# Patient Record
Sex: Male | Born: 1945
Health system: Southern US, Community
[De-identification: ages and names within clinical notes are randomized; demographics above are authoritative.]

## PROBLEM LIST (undated history)

## (undated) DIAGNOSIS — M25519 Pain in unspecified shoulder: Secondary | ICD-10-CM

## (undated) DIAGNOSIS — F17200 Nicotine dependence, unspecified, uncomplicated: Secondary | ICD-10-CM

## (undated) DIAGNOSIS — Z860100 Personal history of colon polyps, unspecified: Secondary | ICD-10-CM

## (undated) DIAGNOSIS — M1990 Unspecified osteoarthritis, unspecified site: Secondary | ICD-10-CM

## (undated) DIAGNOSIS — E559 Vitamin D deficiency, unspecified: Secondary | ICD-10-CM

## (undated) DIAGNOSIS — R001 Bradycardia, unspecified: Secondary | ICD-10-CM

## (undated) DIAGNOSIS — G250 Essential tremor: Secondary | ICD-10-CM

## (undated) DIAGNOSIS — R112 Nausea with vomiting, unspecified: Secondary | ICD-10-CM

## (undated) DIAGNOSIS — I10 Essential (primary) hypertension: Secondary | ICD-10-CM

## (undated) DIAGNOSIS — I499 Cardiac arrhythmia, unspecified: Secondary | ICD-10-CM

## (undated) DIAGNOSIS — Z9889 Other specified postprocedural states: Secondary | ICD-10-CM

## (undated) DIAGNOSIS — E785 Hyperlipidemia, unspecified: Secondary | ICD-10-CM

## (undated) DIAGNOSIS — D369 Benign neoplasm, unspecified site: Secondary | ICD-10-CM

## (undated) DIAGNOSIS — R748 Abnormal levels of other serum enzymes: Secondary | ICD-10-CM

## (undated) DIAGNOSIS — K219 Gastro-esophageal reflux disease without esophagitis: Secondary | ICD-10-CM

## (undated) DIAGNOSIS — M25562 Pain in left knee: Secondary | ICD-10-CM

## (undated) DIAGNOSIS — E291 Testicular hypofunction: Secondary | ICD-10-CM

## (undated) DIAGNOSIS — J302 Other seasonal allergic rhinitis: Secondary | ICD-10-CM

## (undated) DIAGNOSIS — N529 Male erectile dysfunction, unspecified: Secondary | ICD-10-CM

## (undated) DIAGNOSIS — I4892 Unspecified atrial flutter: Secondary | ICD-10-CM

## (undated) DIAGNOSIS — I2510 Atherosclerotic heart disease of native coronary artery without angina pectoris: Secondary | ICD-10-CM

## (undated) DIAGNOSIS — R251 Tremor, unspecified: Secondary | ICD-10-CM

## (undated) DIAGNOSIS — S0990XA Unspecified injury of head, initial encounter: Secondary | ICD-10-CM

## (undated) DIAGNOSIS — G4730 Sleep apnea, unspecified: Secondary | ICD-10-CM

## (undated) DIAGNOSIS — G060 Intracranial abscess and granuloma: Secondary | ICD-10-CM

## (undated) HISTORY — DX: Pain in left knee: M25.562

## (undated) HISTORY — DX: Male erectile dysfunction, unspecified: N52.9

## (undated) HISTORY — DX: Gastro-esophageal reflux disease without esophagitis: K21.9

## (undated) HISTORY — DX: Benign neoplasm, unspecified site: D36.9

## (undated) HISTORY — DX: Intracranial abscess and granuloma: G06.0

## (undated) HISTORY — DX: Tremor, unspecified: R25.1

## (undated) HISTORY — DX: Testicular hypofunction: E29.1

## (undated) HISTORY — DX: Personal history of colon polyps, unspecified: Z86.0100

## (undated) HISTORY — DX: Nicotine dependence, unspecified, uncomplicated: F17.200

## (undated) HISTORY — PX: HERNIA REPAIR: SHX51

## (undated) HISTORY — DX: Essential (primary) hypertension: I10

## (undated) HISTORY — DX: Other seasonal allergic rhinitis: J30.2

## (undated) HISTORY — DX: Personal history of colonic polyps: Z86.010

## (undated) HISTORY — PX: DEEP BRAIN STIMULATOR PLACEMENT: SHX608

## (undated) HISTORY — DX: Pain in unspecified shoulder: M25.519

## (undated) HISTORY — DX: Hyperlipidemia, unspecified: E78.5

## (undated) HISTORY — DX: Vitamin D deficiency, unspecified: E55.9

---

## 2005-09-05 ENCOUNTER — Ambulatory Visit: Payer: Self-pay | Admitting: Unknown Physician Specialty

## 2009-09-08 HISTORY — PX: HAND SURGERY: SHX662

## 2009-11-23 ENCOUNTER — Ambulatory Visit: Payer: Self-pay | Admitting: Emergency Medicine

## 2011-04-18 ENCOUNTER — Ambulatory Visit: Payer: Self-pay | Admitting: Unknown Physician Specialty

## 2011-09-09 HISTORY — PX: COLONOSCOPY: SHX174

## 2011-09-09 LAB — HM COLONOSCOPY: HM Colonoscopy: NORMAL

## 2012-09-21 ENCOUNTER — Other Ambulatory Visit: Payer: Self-pay | Admitting: Family Medicine

## 2012-09-21 NOTE — Telephone Encounter (Signed)
No paper chart

## 2012-09-23 ENCOUNTER — Ambulatory Visit (INDEPENDENT_AMBULATORY_CARE_PROVIDER_SITE_OTHER): Payer: PRIVATE HEALTH INSURANCE | Admitting: Family Medicine

## 2012-09-23 VITALS — BP 164/75 | HR 55 | Temp 97.9°F | Resp 18 | Ht 70.5 in | Wt 196.0 lb

## 2012-09-23 DIAGNOSIS — I10 Essential (primary) hypertension: Secondary | ICD-10-CM

## 2012-09-23 DIAGNOSIS — Z23 Encounter for immunization: Secondary | ICD-10-CM

## 2012-09-23 LAB — POCT CBC
Granulocyte percent: 66.8 %G (ref 37–80)
HCT, POC: 42.7 % — AB (ref 43.5–53.7)
Hemoglobin: 13.4 g/dL — AB (ref 14.1–18.1)
Lymph, poc: 2.8 (ref 0.6–3.4)
MCH, POC: 30.2 pg (ref 27–31.2)
MCHC: 31.4 g/dL — AB (ref 31.8–35.4)
MCV: 96.5 fL (ref 80–97)
MID (cbc): 0.6 (ref 0–0.9)
MPV: 8.8 fL (ref 0–99.8)
POC Granulocyte: 6.9 (ref 2–6.9)
POC LYMPH PERCENT: 27.3 %L (ref 10–50)
POC MID %: 5.9 %M (ref 0–12)
Platelet Count, POC: 342 10*3/uL (ref 142–424)
RBC: 4.43 M/uL — AB (ref 4.69–6.13)
RDW, POC: 13.5 %
WBC: 10.3 10*3/uL — AB (ref 4.6–10.2)

## 2012-09-23 MED ORDER — HYDROCHLOROTHIAZIDE 25 MG PO TABS: 25.0000 mg | ORAL_TABLET | Freq: Every day | ORAL | Status: DC

## 2012-09-23 MED ORDER — LISINOPRIL 10 MG PO TABS: 10.0000 mg | ORAL_TABLET | Freq: Every day | ORAL | Status: DC

## 2012-09-23 NOTE — Progress Notes (Signed)
[place], [place]  [address]   [phone]  Subjective:    Patient ID: Carlos Hunt, male    DOB: Mar 25, 1946, 67 y.o.   MRN: [id number]  HPIThis 67 y.o. male presents to establish care and for six month follow-up:  1. HTN:  Six month follow-up; no changes to management made at last visit.  Reports moderate compliance with medication; ran out of HCTZ one week ago; good tolerance to medication; good symptom control.  Denies CP/palp/SOB/leg swelling. Denies HA/dizziness/vision changes/paresthesias/focal weakness.   Went to eye doctor; checked blood pressure 123/60.  Ran out of HCTZ one week ago.  Last visit 03/2012.Not check ing blood pressure at home.    2.  Plans to get flu vaccine and TDAP; daughter is raising cane.  Granddaughter in NICU for esophageal atresia.      Review of Systems  Constitutional: Negative for fever, chills, diaphoresis and fatigue.  Respiratory: Negative for cough, shortness of breath and wheezing.   Cardiovascular: Negative for chest pain, palpitations and leg swelling.  Gastrointestinal: Negative for nausea, vomiting, abdominal pain, diarrhea and constipation.  Neurological: Negative for dizziness, tremors, syncope, facial asymmetry, speech difficulty, weakness, light-headedness, numbness and headaches.        Past Medical History  Diagnosis Date  . Hypertension   . Hyperlipidemia     No past surgical history on file.  Prior to Admission medications   Medication Sig Start Date End Date Taking? Authorizing Provider  aspirin 325 MG tablet Take 325 mg by mouth daily.   Yes Historical Provider, MD  Cholecalciferol (HM VITAMIN D3) 2000 UNITS CAPS Take 2,000 Units by mouth.   Yes Historical Provider, MD  Echinacea 400 MG CAPS Take 400 mg by mouth daily.   Yes Historical Provider, MD  GLUCOSAMINE-CHONDROIT-MSM-C-MN PO Take by mouth.   Yes Historical Provider, MD  hydrochlorothiazide (HYDRODIURIL) 25 MG tablet Take 25 mg by mouth daily.   Yes  Historical Provider, MD  lisinopril (PRINIVIL,ZESTRIL) 10 MG tablet Take 10 mg by mouth daily.   Yes Historical Provider, MD  Multiple Vitamins-Minerals (CENTRUM PO) Take by mouth.   Yes Historical Provider, MD  Omega-3 Fatty Acids (ULTRA OMEGA 3 PO) Take 1,000 mg by mouth 2 (two) times daily.   Yes Historical Provider, MD  vitamin B-12 (CYANOCOBALAMIN) 1000 MCG tablet Take 3,000 mcg by mouth daily.   Yes Historical Provider, MD    Allergies  Allergen Reactions  . Other Nausea And Vomiting    NOVACAINE    History   Social History  . Marital Status: Married    Spouse Name: N/A    Number of Children: N/A  . Years of Education: N/A   Occupational History  . Not on file.   Social History Main Topics  . Smoking status: Former Smoker    Quit date: 09/24/1971  . Smokeless tobacco: Not on file  . Alcohol Use: Yes  . Drug Use: No  . Sexually Active: Not on file   Other Topics Concern  . Not on file   Social History Narrative  . No narrative on file    Family History  Problem Relation Age of Onset  . Dementia Mother   . Parkinson's disease Father   . Cancer Father 59    prostate  . Diabetes Sister   . Parkinson's disease Brother     Objective:   Physical Exam  Nursing note and vitals reviewed. Constitutional: He is oriented to person, place, and time. He appears  well-developed and well-nourished. No distress.  HENT:  Head: Normocephalic and atraumatic.  Right Ear: External ear normal.  Left Ear: External ear normal.  Nose: Nose normal.  Mouth/Throat: Oropharynx is clear and moist.  Eyes: Conjunctivae normal are normal. Pupils are equal, round, and reactive to light.  Neck: Normal range of motion. Neck supple. No thyromegaly present.  Cardiovascular: Normal rate, regular rhythm and normal heart sounds.  Exam reveals no gallop and no friction rub.   No murmur heard. Pulmonary/Chest: Effort normal and breath sounds normal. He has no wheezes. He has no rales.    Abdominal: Soft. Bowel sounds are normal. He exhibits no distension and no mass. There is no tenderness. There is no rebound and no guarding.  Musculoskeletal: He exhibits no edema.  Lymphadenopathy:    He has no cervical adenopathy.  Neurological: He is alert and oriented to person, place, and time. No cranial nerve deficit. He exhibits normal muscle tone. Coordination normal.  Skin: Skin is warm and dry. He is not diaphoretic.  Psychiatric: He has a normal mood and affect. His behavior is normal.    INFLUENZA AND TDAP VACCINES ADMINISTERED.  Results for orders placed in visit on 09/23/12  POCT CBC      Component Value Range   WBC 10.3 (*) 4.6 - 10.2 K/uL   Lymph, poc 2.8  0.6 - 3.4   POC LYMPH PERCENT 27.3  10 - 50 %L   MID (cbc) 0.6  0 - 0.9   POC MID % 5.9  0 - 12 %M   POC Granulocyte 6.9  2 - 6.9   Granulocyte percent 66.8  37 - 80 %G   RBC 4.43 (*) 4.69 - 6.13 M/uL   Hemoglobin 13.4 (*) 14.1 - 18.1 g/dL   HCT, POC 42.7 (*) 43.5 - 53.7 %   MCV 96.5  80 - 97 fL   MCH, POC 30.2  27 - 31.2 pg   MCHC 31.4 (*) 31.8 - 35.4 g/dL   RDW, POC 13.5     Platelet Count, POC 342  142 - 424 K/uL   MPV 8.8  0 - 99.8 fL       Assessment & Plan:   1. Essential hypertension, benign  POCT CBC, Comprehensive metabolic panel  2. Need for prophylactic vaccination and inoculation against influenza  Flu vaccine greater than or equal to 3yo preservative free IM  3. Need for Tdap vaccination  Tdap vaccine greater than or equal to 7yo IM    1.  HTN: uncontrolled due to non-compliance with medication.  Refills provided; obtain labs.  Encourage checking blood pressure once per month at pharmacy. 2.  S/p TDAP 3.  S/p influenza vaccines.  Meds ordered this encounter  Medications  . Echinacea 400 MG CAPS    Sig: Take 400 mg by mouth daily.  Marland Kitchen GLUCOSAMINE-CHONDROIT-MSM-C-MN PO    Sig: Take by mouth.  . Multiple Vitamins-Minerals (CENTRUM PO)    Sig: Take by mouth.  . Cholecalciferol (HM  VITAMIN D3) 2000 UNITS CAPS    Sig: Take 2,000 Units by mouth.  Marland Kitchen aspirin 325 MG tablet    Sig: Take 325 mg by mouth daily.  Marland Kitchen DISCONTD: hydrochlorothiazide (HYDRODIURIL) 25 MG tablet    Sig: Take 25 mg by mouth daily.  . vitamin B-12 (CYANOCOBALAMIN) 1000 MCG tablet    Sig: Take 3,000 mcg by mouth daily.  Marland Kitchen DISCONTD: lisinopril (PRINIVIL,ZESTRIL) 10 MG tablet    Sig: Take 10 mg by mouth daily.  Marland Kitchen  Omega-3 Fatty Acids (ULTRA OMEGA 3 PO)    Sig: Take 1,000 mg by mouth 2 (two) times daily.  Marland Kitchen lisinopril (PRINIVIL,ZESTRIL) 10 MG tablet    Sig: Take 1 tablet (10 mg total) by mouth daily.    Dispense:  30 tablet    Refill:  5  . hydrochlorothiazide (HYDRODIURIL) 25 MG tablet    Sig: Take 1 tablet (25 mg total) by mouth daily.    Dispense:  30 tablet    Refill:  5

## 2012-09-23 NOTE — Patient Instructions (Addendum)
1. Essential hypertension, benign  POCT CBC, Comprehensive metabolic panel, lisinopril (PRINIVIL,ZESTRIL) 10 MG tablet, hydrochlorothiazide (HYDRODIURIL) 25 MG tablet  2. Need for prophylactic vaccination and inoculation against influenza  Flu vaccine greater than or equal to 67yo preservative free IM  3. Need for Tdap vaccination  Tdap vaccine greater than or equal to 7yo IM     PLEASE RETURN IN SIX MONTHS FOR PHYSICAL EXAMINATION.

## 2012-09-24 LAB — COMPREHENSIVE METABOLIC PANEL
ALT: 39 U/L (ref 0–53)
AST: 34 U/L (ref 0–37)
Albumin: 4.4 g/dL (ref 3.5–5.2)
Alkaline Phosphatase: 63 U/L (ref 39–117)
BUN: 14 mg/dL (ref 6–23)
CO2: 26 mEq/L (ref 19–32)
Calcium: 10 mg/dL (ref 8.4–10.5)
Chloride: 104 mEq/L (ref 96–112)
Creat: 1.02 mg/dL (ref 0.50–1.35)
Glucose, Bld: 79 mg/dL (ref 70–99)
Potassium: 4.4 mEq/L (ref 3.5–5.3)
Sodium: 140 mEq/L (ref 135–145)
Total Bilirubin: 0.6 mg/dL (ref 0.3–1.2)
Total Protein: 7 g/dL (ref 6.0–8.3)

## 2012-09-27 ENCOUNTER — Ambulatory Visit: Payer: Self-pay | Admitting: Family Medicine

## 2012-10-02 ENCOUNTER — Encounter: Payer: Self-pay | Admitting: Radiology

## 2013-02-14 ENCOUNTER — Ambulatory Visit (INDEPENDENT_AMBULATORY_CARE_PROVIDER_SITE_OTHER): Payer: PRIVATE HEALTH INSURANCE | Admitting: Family Medicine

## 2013-02-14 ENCOUNTER — Encounter: Payer: Self-pay | Admitting: Family Medicine

## 2013-02-14 VITALS — BP 140/58 | HR 54 | Temp 97.3°F | Resp 16 | Ht 69.0 in | Wt 179.0 lb

## 2013-02-14 DIAGNOSIS — I10 Essential (primary) hypertension: Secondary | ICD-10-CM

## 2013-02-14 DIAGNOSIS — Z0000 Encounter for general adult medical examination without abnormal findings: Secondary | ICD-10-CM | POA: Insufficient documentation

## 2013-02-14 HISTORY — DX: Essential (primary) hypertension: I10

## 2013-02-14 LAB — CBC WITH DIFFERENTIAL/PLATELET
Basophils Absolute: 0.1 10*3/uL (ref 0.0–0.1)
Basophils Relative: 1 % (ref 0–1)
Eosinophils Absolute: 0.2 10*3/uL (ref 0.0–0.7)
Eosinophils Relative: 2 % (ref 0–5)
HCT: 45.4 % (ref 39.0–52.0)
Hemoglobin: 15.9 g/dL (ref 13.0–17.0)
Lymphocytes Relative: 22 % (ref 12–46)
Lymphs Abs: 2.3 10*3/uL (ref 0.7–4.0)
MCH: 30.6 pg (ref 26.0–34.0)
MCHC: 35 g/dL (ref 30.0–36.0)
MCV: 87.3 fL (ref 78.0–100.0)
Monocytes Absolute: 0.6 10*3/uL (ref 0.1–1.0)
Monocytes Relative: 6 % (ref 3–12)
Neutro Abs: 7.1 10*3/uL (ref 1.7–7.7)
Neutrophils Relative %: 69 % (ref 43–77)
Platelets: 358 10*3/uL (ref 150–400)
RBC: 5.2 MIL/uL (ref 4.22–5.81)
RDW: 13.6 % (ref 11.5–15.5)
WBC: 10.3 10*3/uL (ref 4.0–10.5)

## 2013-02-14 LAB — POCT URINALYSIS DIPSTICK
Bilirubin, UA: NEGATIVE
Blood, UA: NEGATIVE
Glucose, UA: NEGATIVE
Ketones, UA: NEGATIVE
Leukocytes, UA: NEGATIVE
Nitrite, UA: NEGATIVE
Protein, UA: NEGATIVE
Spec Grav, UA: 1.025
Urobilinogen, UA: 0.2
pH, UA: 6

## 2013-02-14 LAB — LIPID PANEL
Cholesterol: 153 mg/dL (ref 0–200)
HDL: 51 mg/dL (ref >39)
LDL Cholesterol: 80 mg/dL (ref 0–99)
Total CHOL/HDL Ratio: 3 Ratio
Triglycerides: 112 mg/dL (ref <150)
VLDL: 22 mg/dL (ref 0–40)

## 2013-02-14 LAB — VITAMIN B12: Vitamin B-12: 1851 pg/mL — ABNORMAL HIGH (ref 211–911)

## 2013-02-14 LAB — COMPREHENSIVE METABOLIC PANEL
ALT: 40 U/L (ref 0–53)
AST: 36 U/L (ref 0–37)
Albumin: 4.9 g/dL (ref 3.5–5.2)
Alkaline Phosphatase: 72 U/L (ref 39–117)
BUN: 18 mg/dL (ref 6–23)
CO2: 26 mEq/L (ref 19–32)
Calcium: 10.2 mg/dL (ref 8.4–10.5)
Chloride: 101 mEq/L (ref 96–112)
Creat: 1.09 mg/dL (ref 0.50–1.35)
Glucose, Bld: 85 mg/dL (ref 70–99)
Potassium: 3.9 mEq/L (ref 3.5–5.3)
Sodium: 138 mEq/L (ref 135–145)
Total Bilirubin: 0.8 mg/dL (ref 0.3–1.2)
Total Protein: 7.8 g/dL (ref 6.0–8.3)

## 2013-02-14 LAB — FOLATE: Folate: >20 ng/mL

## 2013-02-14 LAB — TSH: TSH: 2.679 u[IU]/mL (ref 0.350–4.500)

## 2013-02-14 LAB — PSA: PSA: 0.58 ng/mL (ref <=4.00)

## 2013-02-14 LAB — HEMOGLOBIN A1C
Hgb A1c MFr Bld: 5.4 % (ref <5.7)
Mean Plasma Glucose: 108 mg/dL (ref <117)

## 2013-02-14 MED ORDER — LISINOPRIL 10 MG PO TABS: 10.0000 mg | ORAL_TABLET | Freq: Every day | ORAL | Status: DC

## 2013-02-14 MED ORDER — HYDROCHLOROTHIAZIDE 25 MG PO TABS: 25.0000 mg | ORAL_TABLET | Freq: Every day | ORAL | Status: DC

## 2013-02-14 NOTE — Progress Notes (Signed)
[place], [place]  [address]   [phone]  Subjective:    Patient ID: Carlos Hunt, male    DOB: 08/29/1946, 67 y.o.   MRN: [id number]  HPI This 67 y.o. male presents for five month follow-up and for CPE.  Last physical 2013. Colonoscopy 2013; repeat in 5 years.  S/p three colonoscopies. TDAP 09/2012. Flu vaccine 09/2012. Pneumovax. Zostavax never; no previous chicken pox.   Eye exam 10/2012; +cataract L mild.  No glaucoma.  Nice. Readers. Dental exam cleaning due.   Review of Systems  Constitutional: Negative for fever, chills, diaphoresis, activity change, appetite change, fatigue and unexpected weight change.  HENT: Negative for hearing loss, ear pain, nosebleeds, congestion, sore throat, facial swelling, rhinorrhea, sneezing, drooling, mouth sores, trouble swallowing, neck pain, neck stiffness, dental problem, voice change, postnasal drip, sinus pressure, tinnitus and ear discharge.   Eyes: Negative for photophobia, pain, discharge, redness, itching and visual disturbance.  Respiratory: Negative for apnea, cough, choking, chest tightness, shortness of breath, wheezing and stridor.   Cardiovascular: Negative for chest pain, palpitations and leg swelling.  Gastrointestinal: Negative for nausea, vomiting, abdominal pain, diarrhea, constipation, blood in stool, abdominal distention, anal bleeding and rectal pain.  Endocrine: Negative for cold intolerance, heat intolerance, polydipsia, polyphagia and polyuria.  Genitourinary: Negative for dysuria, urgency, frequency, hematuria, flank pain, decreased urine volume, discharge, penile swelling, scrotal swelling, enuresis, difficulty urinating, genital sores, penile pain and testicular pain.  Musculoskeletal: Negative for myalgias, back pain, joint swelling, arthralgias and gait problem.  Skin: Negative for color change, pallor, rash and wound.  Allergic/Immunologic: Negative for environmental allergies, food allergies and  immunocompromised state.  Neurological: Positive for tremors. Negative for dizziness, seizures, syncope, facial asymmetry, speech difficulty, weakness, light-headedness, numbness and headaches.  Hematological: Negative for adenopathy. Does not bruise/bleed easily.  Psychiatric/Behavioral: Negative for suicidal ideas, hallucinations, behavioral problems, confusion, sleep disturbance, self-injury, dysphoric mood, decreased concentration and agitation. The patient is not nervous/anxious and is not hyperactive.     Past Medical History  Diagnosis Date  . Hypertension   . Hyperlipidemia     Past Surgical History  Procedure Laterality Date  . Colonoscopy  09/09/2011    normal.  Previous polyps.  [name]. Repeat 5 years.    Prior to Admission medications   Medication Sig Start Date End Date Taking? Authorizing Provider  aspirin 325 MG tablet Take 325 mg by mouth daily.   Yes Historical Provider, MD  Cholecalciferol (HM VITAMIN D3) 2000 UNITS CAPS Take 2,000 Units by mouth.   Yes Historical Provider, MD  Echinacea 400 MG CAPS Take 400 mg by mouth daily.   Yes Historical Provider, MD  GLUCOSAMINE-CHONDROIT-MSM-C-MN PO Take by mouth.   Yes Historical Provider, MD  hydrochlorothiazide (HYDRODIURIL) 25 MG tablet Take 1 tablet (25 mg total) by mouth daily. 02/14/13  Yes Wardell Honour, MD  lisinopril (PRINIVIL,ZESTRIL) 10 MG tablet Take 1 tablet (10 mg total) by mouth daily. 02/14/13  Yes Wardell Honour, MD  Multiple Vitamins-Minerals (CENTRUM PO) Take by mouth.   Yes Historical Provider, MD  Omega-3 Fatty Acids (ULTRA OMEGA 3 PO) Take 1,000 mg by mouth 2 (two) times daily.   Yes Historical Provider, MD  vitamin B-12 (CYANOCOBALAMIN) 1000 MCG tablet Take 3,000 mcg by mouth daily.   Yes Historical Provider, MD    Allergies  Allergen Reactions  . Other Nausea And Vomiting    NOVACAINE    History   Social History  . Marital Status: Married  Spouse Name: N/A    Number of Children: N/A  . Years  of Education: N/A   Occupational History  . Not on file.   Social History Main Topics  . Smoking status: Former Smoker    Quit date: 09/24/1971  . Smokeless tobacco: Not on file  . Alcohol Use: Yes  . Drug Use: No  . Sexually Active: Yes   Other Topics Concern  . Not on file   Social History Narrative   Marital status: married x 42 years.      Children: 2 daughters, 2 sons; 6 grandchildren.      Lives: with wife, oldest daughter.      Employment:  [employer] x 10 years; moderately happy.      Tobacco: quit 42 years ago; smoked x 10 years.       Alcohol: socially; weekends.      Drugs: none      Exercise:  None; thinking about joining senior center.      Seatbelt: 100%      Sunscreen: rare sun exposure      Guns:  1 gun; unloaded.          Family History  Problem Relation Age of Onset  . Dementia Mother   . Parkinson's disease Father   . Cancer Father 40    prostate  . Heart disease Father 16    CAD/CABG  . Diabetes Sister   . Parkinson's disease Brother        Objective:   Physical Exam  Nursing note and vitals reviewed. Constitutional: He is oriented to person, place, and time. He appears well-developed and well-nourished. No distress.  HENT:  Head: Normocephalic and atraumatic.  Right Ear: External ear normal.  Left Ear: External ear normal.  Nose: Nose normal.  Mouth/Throat: Oropharynx is clear and moist.  Eyes: Conjunctivae and EOM are normal. Pupils are equal, round, and reactive to light.  Neck: Normal range of motion. Neck supple. No thyromegaly present.  Cardiovascular: Normal rate, regular rhythm, normal heart sounds and intact distal pulses.  Exam reveals no gallop and no friction rub.   No murmur heard. Pulmonary/Chest: Effort normal and breath sounds normal. No respiratory distress. He has no wheezes. He has no rales.  Abdominal: Soft. Bowel sounds are normal. He exhibits no distension and no mass. There is no tenderness. There is no  rebound and no guarding. Hernia confirmed negative in the right inguinal area and confirmed negative in the left inguinal area.  Genitourinary: Rectum normal, prostate normal and penis normal. Right testis shows no mass, no swelling and no tenderness. Left testis shows no mass, no swelling and no tenderness.  Musculoskeletal:       Right shoulder: Normal.       Left shoulder: Normal.       Cervical back: Normal.       Lumbar back: Normal.  Lymphadenopathy:    He has no cervical adenopathy.       Right: No inguinal adenopathy present.       Left: No inguinal adenopathy present.  Neurological: He is alert and oriented to person, place, and time. He has normal strength and normal reflexes. No cranial nerve deficit. He exhibits normal muscle tone. He displays a negative Romberg sign. Coordination normal.  Tremor upper extremities with use.  Skin: Skin is warm and dry. No rash noted. He is not diaphoretic.  Psychiatric: He has a normal mood and affect. His behavior is normal. Judgment and thought content  normal.   EKG: NSR; no ST changes.    Assessment & Plan:  Routine general medical examination at a health care facility - Plan: CBC with Differential, Comprehensive metabolic panel, Hemoglobin A1c, Lipid panel, PSA, TSH, Vitamin B12, Vitamin D 25 hydroxy, Folate, POCT urinalysis dipstick, EKG 12-Lead  Essential hypertension, benign - Plan: lisinopril (PRINIVIL,ZESTRIL) 10 MG tablet, hydrochlorothiazide (HYDRODIURIL) 25 MG tablet, DISCONTINUED: lisinopril (PRINIVIL,ZESTRIL) 10 MG tablet, DISCONTINUED: hydrochlorothiazide (HYDRODIURIL) 25 MG tablet  Meds ordered this encounter  Medications  . DISCONTD: lisinopril (PRINIVIL,ZESTRIL) 10 MG tablet    Sig: Take 1 tablet (10 mg total) by mouth daily.    Dispense:  30 tablet    Refill:  5  . DISCONTD: hydrochlorothiazide (HYDRODIURIL) 25 MG tablet    Sig: Take 1 tablet (25 mg total) by mouth daily.    Dispense:  30 tablet    Refill:  5  .  lisinopril (PRINIVIL,ZESTRIL) 10 MG tablet    Sig: Take 1 tablet (10 mg total) by mouth daily.    Dispense:  30 tablet    Refill:  11  . hydrochlorothiazide (HYDRODIURIL) 25 MG tablet    Sig: Take 1 tablet (25 mg total) by mouth daily.    Dispense:  30 tablet    Refill:  11

## 2013-02-14 NOTE — Assessment & Plan Note (Signed)
Anticipatory guidance ---- exercise, weight maintenance.  Colonoscopy UTD.  Immunizations UTD; clarify date of Pneumovax.  Obtain labs.

## 2013-02-14 NOTE — Assessment & Plan Note (Signed)
Controlled; obtain labs; refills provided; follow-up in six months.

## 2013-02-15 LAB — VITAMIN D 25 HYDROXY (VIT D DEFICIENCY, FRACTURES): Vit D, 25-Hydroxy: 63 ng/mL (ref 30–89)

## 2013-02-22 ENCOUNTER — Encounter: Payer: Self-pay | Admitting: *Deleted

## 2013-02-23 ENCOUNTER — Encounter: Payer: Self-pay | Admitting: *Deleted

## 2013-07-27 ENCOUNTER — Encounter: Payer: Self-pay | Admitting: Family Medicine

## 2013-07-27 ENCOUNTER — Ambulatory Visit (INDEPENDENT_AMBULATORY_CARE_PROVIDER_SITE_OTHER): Payer: PRIVATE HEALTH INSURANCE | Admitting: Family Medicine

## 2013-07-27 VITALS — BP 133/63 | HR 54 | Temp 98.1°F | Resp 16 | Ht 70.0 in | Wt 191.0 lb

## 2013-07-27 DIAGNOSIS — I10 Essential (primary) hypertension: Secondary | ICD-10-CM

## 2013-07-27 DIAGNOSIS — Z23 Encounter for immunization: Secondary | ICD-10-CM

## 2013-07-27 DIAGNOSIS — R7309 Other abnormal glucose: Secondary | ICD-10-CM

## 2013-07-27 LAB — COMPREHENSIVE METABOLIC PANEL
ALT: 38 U/L (ref 0–53)
AST: 32 U/L (ref 0–37)
Albumin: 4.7 g/dL (ref 3.5–5.2)
Alkaline Phosphatase: 70 U/L (ref 39–117)
BUN: 20 mg/dL (ref 6–23)
CO2: 27 mEq/L (ref 19–32)
Calcium: 10.4 mg/dL (ref 8.4–10.5)
Chloride: 100 mEq/L (ref 96–112)
Creat: 1.07 mg/dL (ref 0.50–1.35)
Glucose, Bld: 96 mg/dL (ref 70–99)
Potassium: 4.3 mEq/L (ref 3.5–5.3)
Sodium: 138 mEq/L (ref 135–145)
Total Bilirubin: 0.8 mg/dL (ref 0.3–1.2)
Total Protein: 7.4 g/dL (ref 6.0–8.3)

## 2013-07-27 LAB — CBC WITH DIFFERENTIAL/PLATELET
Basophils Absolute: 0.1 10*3/uL (ref 0.0–0.1)
Basophils Relative: 1 % (ref 0–1)
Eosinophils Absolute: 0.2 10*3/uL (ref 0.0–0.7)
Eosinophils Relative: 2 % (ref 0–5)
HCT: 45.2 % (ref 39.0–52.0)
Hemoglobin: 15.9 g/dL (ref 13.0–17.0)
Lymphocytes Relative: 23 % (ref 12–46)
Lymphs Abs: 2.8 10*3/uL (ref 0.7–4.0)
MCH: 32.1 pg (ref 26.0–34.0)
MCHC: 35.2 g/dL (ref 30.0–36.0)
MCV: 91.3 fL (ref 78.0–100.0)
Monocytes Absolute: 0.7 10*3/uL (ref 0.1–1.0)
Monocytes Relative: 6 % (ref 3–12)
Neutro Abs: 8.2 10*3/uL — ABNORMAL HIGH (ref 1.7–7.7)
Neutrophils Relative %: 68 % (ref 43–77)
Platelets: 372 10*3/uL (ref 150–400)
RBC: 4.95 MIL/uL (ref 4.22–5.81)
RDW: 13.1 % (ref 11.5–15.5)
WBC: 12 10*3/uL — ABNORMAL HIGH (ref 4.0–10.5)

## 2013-07-27 LAB — HEMOGLOBIN A1C
Hgb A1c MFr Bld: 5.5 % (ref <5.7)
Mean Plasma Glucose: 111 mg/dL (ref <117)

## 2013-07-27 NOTE — Progress Notes (Signed)
[place], [place]  [address]   [phone]  Subjective:    Patient ID: Carlos Hunt, male    DOB: [birth year], 67 y.o.   MRN: [id number]  HPI This 67 y.o. male presents for six month follow-up:  1. HTN:  Six month follow-up; no changes to management made at last visit.  Reports good compliance with medication; good tolerance to medication; good symptom control; denies CP/palp/SOB/leg swelling/HA/dizziness.  2.  Flu vaccine: requesting.  3.  Glucose intolerance:  Normal glucose and HgbA1c at last visit.  Review of Systems  Constitutional: Negative for fever, chills, diaphoresis and fatigue.  Respiratory: Negative for cough, shortness of breath and stridor.   Cardiovascular: Negative for chest pain, palpitations and leg swelling.  Gastrointestinal: Negative for nausea, vomiting, abdominal pain, diarrhea, constipation and abdominal distention.  Skin: Negative for rash.  Neurological: Negative for dizziness, seizures, facial asymmetry, light-headedness, numbness and headaches.       Past Medical History  Diagnosis Date  . Hypertension   . Hyperlipidemia   . Pain in left knee   . Blood in stool   . Pain, joint, shoulder   . Tremor   . GERD (gastroesophageal reflux disease)   . Erectile dysfunction   . AR (allergic rhinitis)   . Tobacco use disorder   . Abnormal involuntary movement   . Hx of colonic polyps   . Unspecified vitamin D deficiency   . Testicular hypofunction    Past Surgical History  Procedure Laterality Date  . Colonoscopy  09/09/2011    normal.  Previous polyps.  [name]. Repeat 5 years.  . Left hand surgery  2011    trauma   Allergies  Allergen Reactions  . Other Nausea And Vomiting    NOVACAINE   Current Outpatient Prescriptions on File Prior to Visit  Medication Sig Dispense Refill  . aspirin 325 MG tablet Take 325 mg by mouth daily.      . Cholecalciferol (HM VITAMIN D3) 2000 UNITS CAPS Take 2,000 Units by mouth.      . Echinacea 400 MG  CAPS Take 400 mg by mouth daily.      Marland Kitchen GLUCOSAMINE-CHONDROIT-MSM-C-MN PO Take by mouth.      . hydrochlorothiazide (HYDRODIURIL) 25 MG tablet Take 1 tablet (25 mg total) by mouth daily.  30 tablet  11  . lisinopril (PRINIVIL,ZESTRIL) 10 MG tablet Take 1 tablet (10 mg total) by mouth daily.  30 tablet  11  . Multiple Vitamins-Minerals (CENTRUM PO) Take by mouth.      . Omega-3 Fatty Acids (ULTRA OMEGA 3 PO) Take 1,000 mg by mouth 2 (two) times daily.      . vitamin B-12 (CYANOCOBALAMIN) 1000 MCG tablet Take 3,000 mcg by mouth daily.       No current facility-administered medications on file prior to visit.   History   Social History  . Marital Status: Married    Spouse Name: N/A    Number of Children: 4  . Years of Education: N/A   Occupational History  . building maintenance     [employer]   Social History Main Topics  . Smoking status: Former Smoker -- 3.00 packs/day for 10 years    Quit date: 09/24/1971  . Smokeless tobacco: Not on file     Comment: quit in the 1970's  . Alcohol Use: Yes     Comment: occasional; once every 6 months  . Drug Use: No  . Sexual Activity: Yes  Other Topics Concern  . Not on file   Social History Narrative   Marital status: married x 42 years.      Children: 2 daughters, 2 sons; 6 grandchildren.      Lives: with wife, oldest daughter.      Employment:  [employer] x 10 years; moderately happy.      Tobacco: quit 42 years ago; smoked x 10 years.       Alcohol: socially; weekends.      Drugs: none      Exercise:  None; thinking about joining senior center.      Seatbelt: 100%      Sunscreen: rare sun exposure      Guns:  4 gun; unloaded, not stored in locked cabinet.       Smoke alarm and carbon monoxide detector in the home.      Caffeine use: consumes a minimal amount   Living Will: patient does NOT have Living Will; Does NOT have HCPOA; getting ready to work on it   Organ Donor: NO         Family History  Problem  Relation Age of Onset  . Dementia Mother   . Parkinson's disease Father   . Cancer Father 20    prostate  . Heart disease Father 61    CAD/CABG  . Hypertension Father   . Hyperlipidemia Father   . Diabetes Sister   . Parkinson's disease Brother     Objective:   Physical Exam  Nursing note and vitals reviewed. Constitutional: He appears well-developed and well-nourished. No distress.  HENT:  Head: Normocephalic and atraumatic.  Right Ear: External ear normal.  Left Ear: External ear normal.  Nose: Nose normal.  Mouth/Throat: Oropharynx is clear and moist.  Eyes: Conjunctivae and EOM are normal. Pupils are equal, round, and reactive to light.  Neck: Normal range of motion. Neck supple. No JVD present. No thyromegaly present.  Cardiovascular: Normal rate, regular rhythm, normal heart sounds and intact distal pulses.  Exam reveals no gallop and no friction rub.   No murmur heard. Pulmonary/Chest: Effort normal and breath sounds normal. He has no wheezes. He has no rales.  Abdominal: Soft. Bowel sounds are normal. He exhibits no distension and no mass. There is no tenderness. There is no rebound and no guarding.  Lymphadenopathy:    He has no cervical adenopathy.  Skin: Skin is warm and dry. No rash noted. He is not diaphoretic.  Well healing eschar L forearm with scant surrounding erythema.  Psychiatric: He has a normal mood and affect. His behavior is normal. Judgment and thought content normal.       Assessment & Plan:  Need for prophylactic vaccination and inoculation against influenza - Plan: Flu Vaccine QUAD 36+ mos IM  Essential hypertension, benign - Plan: CBC with Differential, Comprehensive metabolic panel  Other abnormal glucose - Plan: Hemoglobin A1c  No orders of the defined types were placed in this encounter.   1. HTN: controlled; obtain labs; no change in management. 2.  Glucose intolerance: improved/stable.  Normal glucose and HgbA1c at last visit; continue  with weight loss. 3.  S/p flu vaccine.  [name], M.D.  [facility] [address] [place], [place]  [address] [phone] phone [phone] fax

## 2013-09-02 ENCOUNTER — Other Ambulatory Visit: Payer: Self-pay | Admitting: Family Medicine

## 2014-01-31 ENCOUNTER — Encounter: Payer: PRIVATE HEALTH INSURANCE | Admitting: Family Medicine

## 2014-02-06 ENCOUNTER — Ambulatory Visit (INDEPENDENT_AMBULATORY_CARE_PROVIDER_SITE_OTHER): Payer: PRIVATE HEALTH INSURANCE | Admitting: Family Medicine

## 2014-02-06 ENCOUNTER — Encounter: Payer: Self-pay | Admitting: Family Medicine

## 2014-02-06 VITALS — BP 140/66 | HR 50 | Temp 97.9°F | Resp 16 | Ht 70.0 in | Wt 194.0 lb

## 2014-02-06 DIAGNOSIS — Z125 Encounter for screening for malignant neoplasm of prostate: Secondary | ICD-10-CM

## 2014-02-06 DIAGNOSIS — E7800 Pure hypercholesterolemia, unspecified: Secondary | ICD-10-CM

## 2014-02-06 DIAGNOSIS — I10 Essential (primary) hypertension: Secondary | ICD-10-CM

## 2014-02-06 DIAGNOSIS — R251 Tremor, unspecified: Secondary | ICD-10-CM

## 2014-02-06 DIAGNOSIS — R7309 Other abnormal glucose: Secondary | ICD-10-CM

## 2014-02-06 DIAGNOSIS — Z0000 Encounter for general adult medical examination without abnormal findings: Secondary | ICD-10-CM

## 2014-02-06 LAB — CBC WITH DIFFERENTIAL/PLATELET
Basophils Absolute: 0 10*3/uL (ref 0.0–0.1)
Basophils Relative: 0 % (ref 0–1)
Eosinophils Absolute: 0.2 10*3/uL (ref 0.0–0.7)
Eosinophils Relative: 2 % (ref 0–5)
HCT: 42.8 % (ref 39.0–52.0)
Hemoglobin: 15.3 g/dL (ref 13.0–17.0)
Lymphocytes Relative: 22 % (ref 12–46)
Lymphs Abs: 2 10*3/uL (ref 0.7–4.0)
MCH: 31.4 pg (ref 26.0–34.0)
MCHC: 35.7 g/dL (ref 30.0–36.0)
MCV: 87.9 fL (ref 78.0–100.0)
Monocytes Absolute: 0.6 10*3/uL (ref 0.1–1.0)
Monocytes Relative: 7 % (ref 3–12)
Neutro Abs: 6.3 10*3/uL (ref 1.7–7.7)
Neutrophils Relative %: 69 % (ref 43–77)
Platelets: 332 10*3/uL (ref 150–400)
RBC: 4.87 MIL/uL (ref 4.22–5.81)
RDW: 13.7 % (ref 11.5–15.5)
WBC: 9.2 10*3/uL (ref 4.0–10.5)

## 2014-02-06 LAB — POCT URINALYSIS DIPSTICK
Bilirubin, UA: NEGATIVE
Blood, UA: NEGATIVE
Glucose, UA: NEGATIVE
Ketones, UA: NEGATIVE
Leukocytes, UA: NEGATIVE
Nitrite, UA: NEGATIVE
Protein, UA: NEGATIVE
Spec Grav, UA: 1.02
Urobilinogen, UA: 0.2
pH, UA: 6.5

## 2014-02-06 LAB — HEMOGLOBIN A1C
Hgb A1c MFr Bld: 5.6 % (ref <5.7)
Mean Plasma Glucose: 114 mg/dL (ref <117)

## 2014-02-06 MED ORDER — HYDROCHLOROTHIAZIDE 25 MG PO TABS: 25.0000 mg | ORAL_TABLET | Freq: Every day | ORAL | Status: DC

## 2014-02-06 MED ORDER — LISINOPRIL 10 MG PO TABS: 10.0000 mg | ORAL_TABLET | Freq: Every day | ORAL | Status: DC

## 2014-02-06 NOTE — Progress Notes (Signed)
   Subjective:    Patient ID: Carlos Hunt, male    DOB: 04-16-1946, 68 y.o.   MRN: [id number]  HPI    Review of Systems  Constitutional: Negative.   HENT: Negative.   Eyes: Negative.   Respiratory: Negative.   Cardiovascular: Negative.   Gastrointestinal: Negative.   Endocrine: Negative.   Genitourinary: Negative.   Musculoskeletal: Positive for arthralgias.  Skin: Negative.   Allergic/Immunologic: Positive for environmental allergies.  Neurological: Positive for tremors.  Psychiatric/Behavioral: Negative.        Objective:   Physical Exam        Assessment & Plan:

## 2014-02-06 NOTE — Progress Notes (Signed)
Subjective:    Patient ID: Carlos Hunt, male    DOB: 1946/06/18, 68 y.o.   MRN: [id number]  02/06/2014  Annual Exam   HPI This 68 y.o. male presents for Complete Physical Examination.  Last physical exam 02/14/2013. Colonoscopy 2013; repeat in 5 years. Previous polyps.  [name]. TDAP 2014. Pneumovax never; declines. Zostavax never; no previous chicken pox. Flu vaccine 07/27/2013. Eye exam 10/2013; [place]; early cataracts; no glaucoma. Dental exam every six months.  HTN: six month follow-up; no changes to management made at last visit.  Patient reports good compliance with medication, good tolerance to medication, and good symptom control.    Tremor RUE: worsening especially with fatigue.  Writing is really being affected; now ready to undergo neurology consultation regarding tremor to discuss etiology and treatment options.      Review of Systems SEE CMA NOTE.  Past Medical History  Diagnosis Date  . Hypertension   . Hyperlipidemia   . Pain in left knee   . Blood in stool   . Pain, joint, shoulder   . Tremor   . GERD (gastroesophageal reflux disease)   . Erectile dysfunction   . AR (allergic rhinitis)   . Tobacco use disorder   . Abnormal involuntary movement   . Hx of colonic polyps   . Unspecified vitamin D deficiency   . Testicular hypofunction   . Allergy    Past Surgical History  Procedure Laterality Date  . Colonoscopy  09/09/2011    normal.  Previous polyps.  [name]. Repeat 5 years.  . Left hand surgery  2011    trauma    Allergies  Allergen Reactions  . Other Nausea And Vomiting    NOVACAINE   Current Outpatient Prescriptions  Medication Sig Dispense Refill  . aspirin 325 MG tablet Take 325 mg by mouth daily.      . Cholecalciferol (HM VITAMIN D3) 2000 UNITS CAPS Take 2,000 Units by mouth.      . Echinacea 400 MG CAPS Take 400 mg by mouth daily.      Marland Kitchen GLUCOSAMINE-CHONDROIT-MSM-C-MN PO Take by mouth.      . hydrochlorothiazide (HYDRODIURIL) 25 MG  tablet TAKE 1 TABLET (25 MG TOTAL) BY MOUTH DAILY.  30 tablet  5  . hydrochlorothiazide (HYDRODIURIL) 25 MG tablet Take 1 tablet (25 mg total) by mouth daily.  30 tablet  11  . lisinopril (PRINIVIL,ZESTRIL) 10 MG tablet TAKE 1 TABLET (10 MG TOTAL) BY MOUTH DAILY.  30 tablet  5  . lisinopril (PRINIVIL,ZESTRIL) 10 MG tablet Take 1 tablet (10 mg total) by mouth daily.  30 tablet  11  . Multiple Vitamins-Minerals (CENTRUM PO) Take by mouth.      . Omega-3 Fatty Acids (ULTRA OMEGA 3 PO) Take 1,000 mg by mouth 2 (two) times daily.      . vitamin B-12 (CYANOCOBALAMIN) 1000 MCG tablet Take 3,000 mcg by mouth daily.       No current facility-administered medications for this visit.   History   Social History  . Marital Status: Married    Spouse Name: N/A    Number of Children: 4  . Years of Education: N/A   Occupational History  . building maintenance     [employer]   Social History Main Topics  . Smoking status: Former Smoker -- 3.00 packs/day for 10 years    Quit date: 09/24/1971  . Smokeless tobacco: Not on file     Comment: quit in the 1970's  . Alcohol Use:  Yes     Comment: occasional; once every 6 months  . Drug Use: No  . Sexual Activity: Yes   Other Topics Concern  . Not on file   Social History Narrative   Marital status: married x 43 years.      Children: 2 daughters, 2 sons; 6 grandchildren.      Lives: with wife, oldest daughter.      Employment:  [employer] x 11 years; moderately happy.  Maintenance.      Tobacco: quit 42 years ago; smoked x 10 years.       Alcohol: socially; weekends.      Drugs: none      Exercise:  None; joining senior center.        Seatbelt: 100%      Sunscreen: rare sun exposure      Guns:  4 gun; unloaded, not stored in locked cabinet.       Smoke alarm and carbon monoxide detector in the home.      Caffeine use: consumes a minimal amount   Living Will: patient does NOT have Living Will; Does NOT have HCPOA; getting ready to  work on it   Organ Donor: NO         Family History  Problem Relation Age of Onset  . Dementia Mother   . Parkinson's disease Father   . Cancer Father 48    prostate  . Heart disease Father 56    CAD/CABG  . Hypertension Father   . Hyperlipidemia Father   . Diabetes Sister   . Parkinson's disease Brother        Objective:    BP 140/66  Pulse 50  Temp(Src) 97.9 F (36.6 C) (Oral)  Resp 16  Ht 5' 10"  (1.778 m)  Wt 194 lb (87.998 kg)  BMI 27.84 kg/m2  SpO2 98% Physical Exam  Constitutional: He is oriented to person, place, and time. He appears well-developed and well-nourished. No distress.  HENT:  Head: Normocephalic and atraumatic.  Right Ear: External ear normal.  Left Ear: External ear normal.  Nose: Nose normal.  Mouth/Throat: Oropharynx is clear and moist.  Eyes: Conjunctivae and EOM are normal. Pupils are equal, round, and reactive to light.  Neck: Normal range of motion. Neck supple. Carotid bruit is not present. No thyromegaly present.  Cardiovascular: Normal rate, regular rhythm, normal heart sounds and intact distal pulses.  Exam reveals no gallop and no friction rub.   No murmur heard. Pulmonary/Chest: Effort normal and breath sounds normal. He has no wheezes. He has no rales.  Abdominal: Soft. Bowel sounds are normal. He exhibits no distension and no mass. There is no tenderness. There is no rebound and no guarding. Hernia confirmed negative in the right inguinal area and confirmed negative in the left inguinal area.  Genitourinary: Prostate normal, testes normal and penis normal. Prostate is not enlarged and not tender. Right testis shows no mass, no swelling and no tenderness. Left testis shows no mass, no swelling and no tenderness.  Musculoskeletal:       Right shoulder: Normal.       Left shoulder: Normal.       Cervical back: Normal.  Lymphadenopathy:    He has no cervical adenopathy.       Right: No inguinal adenopathy present.       Left: No  inguinal adenopathy present.  Neurological: He is alert and oriented to person, place, and time. He has normal strength and normal reflexes. No  cranial nerve deficit or sensory deficit. He exhibits normal muscle tone. Coordination and gait normal.  Tremor RUE with extension of R arm.  Skin: Skin is warm and dry. No rash noted. He is not diaphoretic.  Psychiatric: He has a normal mood and affect. His behavior is normal. Judgment and thought content normal.   Results for orders placed in visit on 02/06/14  POCT URINALYSIS DIPSTICK      Result Value Ref Range   Color, UA yellow     Clarity, UA clear     Glucose, UA neg     Bilirubin, UA neg     Ketones, UA neg     Spec Grav, UA 1.020     Blood, UA neg     pH, UA 6.5     Protein, UA neg     Urobilinogen, UA 0.2     Nitrite, UA neg     Leukocytes, UA Negative     EKG: NSR; no ST changes.    Assessment & Plan:  Routine general medical examination at a health care facility - Plan: CBC with Differential, PSA, POCT urinalysis dipstick, EKG 12-Lead  Pure hypercholesterolemia - Plan: Lipid panel  Essential hypertension, benign - Plan: COMPLETE METABOLIC PANEL WITH GFR, TSH, EKG 12-Lead, hydrochlorothiazide (HYDRODIURIL) 25 MG tablet, lisinopril (PRINIVIL,ZESTRIL) 10 MG tablet  Other abnormal glucose - Plan: Hemoglobin A1c  Tremor - Plan: Ambulatory referral to Neurology  Prostate cancer screening  1.  Complete physical examination:  Anticipatory guidance --- weight loss, regular exercise.  Colonoscopy UTD. Reviewed immunizations---pt declined Pneumovax, Prevnar, Zostavax.   2.  Prostate cancer screening: discussed during visit; pt desires DRE with PSA. Father with prostate cancer age 61. 56.  HTN: moderately controlled; obtain labs, u/a, EKG; refills provided; no change to management. 4. Tremor: worsening; refer to neurology to confirm diagnosis of intention tremor and to discuss treatment options. 5.  Glucose intolerance:  improved/controlled with dietary modification.  Obtain labs. 6.  Hyperlipidemia: stable with dietary modification; repeat labs.    Meds ordered this encounter  Medications  . hydrochlorothiazide (HYDRODIURIL) 25 MG tablet    Sig: Take 1 tablet (25 mg total) by mouth daily.    Dispense:  30 tablet    Refill:  11  . lisinopril (PRINIVIL,ZESTRIL) 10 MG tablet    Sig: Take 1 tablet (10 mg total) by mouth daily.    Dispense:  30 tablet    Refill:  11    No Follow-up on file.    [name], M.D.  [facility] [address] [place], [place]  [address] [phone] phone [phone] fax

## 2014-02-06 NOTE — Progress Notes (Signed)
   Subjective:    Patient ID: Carlos Hunt, male    DOB: 01-05-1946, 68 y.o.   MRN: [id number]  HPI    Review of Systems  Constitutional: Negative.   HENT: Negative.   Eyes: Negative.   Respiratory: Negative.   Cardiovascular: Negative.   Gastrointestinal: Negative.   Endocrine: Negative.   Genitourinary: Negative.   Musculoskeletal: Positive for arthralgias.  Skin: Negative.   Allergic/Immunologic: Negative.   Neurological: Positive for tremors.  Hematological: Negative.   Psychiatric/Behavioral: Negative.        Objective:   Physical Exam        Assessment & Plan:

## 2014-02-07 LAB — PSA: PSA: 0.69 ng/mL (ref <=4.00)

## 2014-02-07 LAB — COMPLETE METABOLIC PANEL WITH GFR
ALT: 36 U/L (ref 0–53)
AST: 25 U/L (ref 0–37)
Albumin: 4.5 g/dL (ref 3.5–5.2)
Alkaline Phosphatase: 64 U/L (ref 39–117)
BUN: 15 mg/dL (ref 6–23)
CO2: 24 mEq/L (ref 19–32)
Calcium: 9.6 mg/dL (ref 8.4–10.5)
Chloride: 102 mEq/L (ref 96–112)
Creat: 0.95 mg/dL (ref 0.50–1.35)
GFR, Est African American: >89 mL/min
GFR, Est Non African American: 82 mL/min
Glucose, Bld: 91 mg/dL (ref 70–99)
Potassium: 4.2 mEq/L (ref 3.5–5.3)
Sodium: 137 mEq/L (ref 135–145)
Total Bilirubin: 0.6 mg/dL (ref 0.2–1.2)
Total Protein: 7.1 g/dL (ref 6.0–8.3)

## 2014-02-07 LAB — LIPID PANEL
Cholesterol: 149 mg/dL (ref 0–200)
HDL: 41 mg/dL (ref >39)
LDL Cholesterol: 71 mg/dL (ref 0–99)
Total CHOL/HDL Ratio: 3.6 Ratio
Triglycerides: 185 mg/dL — ABNORMAL HIGH (ref <150)
VLDL: 37 mg/dL (ref 0–40)

## 2014-02-07 LAB — TSH: TSH: 2.54 u[IU]/mL (ref 0.350–4.500)

## 2014-02-14 ENCOUNTER — Encounter: Payer: PRIVATE HEALTH INSURANCE | Admitting: Family Medicine

## 2014-08-07 ENCOUNTER — Ambulatory Visit (INDEPENDENT_AMBULATORY_CARE_PROVIDER_SITE_OTHER): Payer: Managed Care, Other (non HMO) | Admitting: Family Medicine

## 2014-08-07 ENCOUNTER — Encounter: Payer: Self-pay | Admitting: Family Medicine

## 2014-08-07 VITALS — BP 154/68 | HR 52 | Temp 98.3°F | Resp 16 | Ht 69.0 in | Wt 196.2 lb

## 2014-08-07 DIAGNOSIS — G250 Essential tremor: Secondary | ICD-10-CM | POA: Insufficient documentation

## 2014-08-07 DIAGNOSIS — I10 Essential (primary) hypertension: Secondary | ICD-10-CM

## 2014-08-07 DIAGNOSIS — M545 Low back pain, unspecified: Secondary | ICD-10-CM

## 2014-08-07 LAB — CBC WITH DIFFERENTIAL/PLATELET
Basophils Absolute: 0 10*3/uL (ref 0.0–0.1)
Basophils Relative: 0 % (ref 0–1)
Eosinophils Absolute: 0.1 10*3/uL (ref 0.0–0.7)
Eosinophils Relative: 1 % (ref 0–5)
HCT: 43.5 % (ref 39.0–52.0)
Hemoglobin: 15.4 g/dL (ref 13.0–17.0)
Lymphocytes Relative: 24 % (ref 12–46)
Lymphs Abs: 2.5 10*3/uL (ref 0.7–4.0)
MCH: 32.1 pg (ref 26.0–34.0)
MCHC: 35.4 g/dL (ref 30.0–36.0)
MCV: 90.6 fL (ref 78.0–100.0)
MPV: 10 fL (ref 9.4–12.4)
Monocytes Absolute: 0.7 10*3/uL (ref 0.1–1.0)
Monocytes Relative: 7 % (ref 3–12)
Neutro Abs: 7.2 10*3/uL (ref 1.7–7.7)
Neutrophils Relative %: 68 % (ref 43–77)
Platelets: 346 10*3/uL (ref 150–400)
RBC: 4.8 MIL/uL (ref 4.22–5.81)
RDW: 13.4 % (ref 11.5–15.5)
WBC: 10.6 10*3/uL — ABNORMAL HIGH (ref 4.0–10.5)

## 2014-08-07 NOTE — Progress Notes (Signed)
Subjective:    Patient ID: Carlos Hunt, male    DOB: 1946-08-08, 68 y.o.   MRN: [id number]  08/07/2014  Hypertension   HPI This 68 y.o. male presents for six moth follow-up:  1. Hypertension:  Patient reports good compliance with medication, good tolerance to medication, and good symptom control.  Not checking BP at home.  Just recovering from Thanksgiving dinner.    2.  Essential Tremor: s/p neurology consultation.   Started Primidone qid; works 12 hours per day; caused sedation.   Woke up at 1:30am this morning; had to be at work at 3:00am. Followed up after one month; has another appointment in 10/2014.    3.  Acute stress reaction: wife has suffered with two detached retinas; had a third detached retina; presented to [facility] with complete detached retina; five hour surgery [place] with partner.  Had a third detachment a third time in L eye.  L eye is not blind but has oil in it and cannot see; vision should return.  Postmenopausal bleeding: s/p D&C recently; s/p polyp resection.  No cancer.   Wife fell in middle of that.    4.  Low back pain: B muscular pain; onset three months ago; no injury; no radiation into legs; no n/t/w; no saddle paresthesias; no b/b dysfunction.  Pain with bending and changing positions. No nighttime awakening.  No daily pain. No current pain. No urinary or GI symptoms associated with pain.  Has not taken any medication for pain.  Job is physical demanding and pt lifts a lot at work.    Review of Systems  Constitutional: Negative for fever, chills, diaphoresis, activity change, appetite change and fatigue.  Respiratory: Negative for cough and shortness of breath.   Cardiovascular: Negative for chest pain, palpitations and leg swelling.  Gastrointestinal: Negative for nausea, vomiting, abdominal pain and diarrhea.  Endocrine: Negative for cold intolerance, heat intolerance, polydipsia, polyphagia and polyuria.  Musculoskeletal: Positive for myalgias and back  pain. Negative for neck pain and neck stiffness.  Skin: Negative for color change, rash and wound.  Neurological: Positive for tremors. Negative for dizziness, seizures, syncope, facial asymmetry, speech difficulty, weakness, light-headedness, numbness and headaches.  Psychiatric/Behavioral: Negative for sleep disturbance and dysphoric mood. The patient is not nervous/anxious.     Past Medical History  Diagnosis Date  . Hypertension   . Hyperlipidemia   . Pain in left knee   . Blood in stool   . Pain, joint, shoulder   . Tremor   . GERD (gastroesophageal reflux disease)   . Erectile dysfunction   . AR (allergic rhinitis)   . Tobacco use disorder   . Abnormal involuntary movement   . Hx of colonic polyps   . Unspecified vitamin D deficiency   . Testicular hypofunction   . Allergy    Past Surgical History  Procedure Laterality Date  . Colonoscopy  09/09/2011    normal.  Previous polyps.  [name]. Repeat 5 years.  . Left hand surgery  2011    trauma   Allergies  Allergen Reactions  . Other Nausea And Vomiting    NOVACAINE   Current Outpatient Prescriptions  Medication Sig Dispense Refill  . aspirin 325 MG tablet Take 325 mg by mouth daily.    . Cholecalciferol (HM VITAMIN D3) 2000 UNITS CAPS Take 2,000 Units by mouth.    . Echinacea 400 MG CAPS Take 400 mg by mouth daily.    Marland Kitchen GLUCOSAMINE-CHONDROIT-MSM-C-MN PO Take by mouth.    Marland Kitchen  hydrochlorothiazide (HYDRODIURIL) 25 MG tablet Take 1 tablet (25 mg total) by mouth daily. 30 tablet 11  . lisinopril (PRINIVIL,ZESTRIL) 10 MG tablet Take 1 tablet (10 mg total) by mouth daily. 30 tablet 11  . Multiple Vitamins-Minerals (CENTRUM PO) Take by mouth.    . Omega-3 Fatty Acids (ULTRA OMEGA 3 PO) Take 1,000 mg by mouth 2 (two) times daily.    . primidone (MYSOLINE) 50 MG tablet Take by mouth 4 (four) times daily.    . vitamin B-12 (CYANOCOBALAMIN) 1000 MCG tablet Take 3,000 mcg by mouth daily.     No current facility-administered  medications for this visit.       Objective:    BP 154/68 mmHg  Pulse 52  Temp(Src) 98.3 F (36.8 C) (Oral)  Resp 16  Ht 5' 9" (1.753 m)  Wt 196 lb 3.2 oz (88.996 kg)  BMI 28.96 kg/m2  SpO2 99% Physical Exam  Constitutional: He is oriented to person, place, and time. He appears well-developed and well-nourished. No distress.  HENT:  Head: Normocephalic and atraumatic.  Right Ear: External ear normal.  Left Ear: External ear normal.  Nose: Nose normal.  Mouth/Throat: Oropharynx is clear and moist.  Eyes: Conjunctivae and EOM are normal. Pupils are equal, round, and reactive to light.  Neck: Normal range of motion. Neck supple. Carotid bruit is not present. No thyromegaly present.  Cardiovascular: Normal rate, regular rhythm, normal heart sounds and intact distal pulses.  Exam reveals no gallop and no friction rub.   No murmur heard. Pulmonary/Chest: Effort normal and breath sounds normal. He has no wheezes. He has no rales.  Abdominal: Soft. Bowel sounds are normal. He exhibits no distension and no mass. There is no tenderness. There is no rebound and no guarding.  Musculoskeletal:       Lumbar back: He exhibits normal range of motion, no tenderness, no bony tenderness, no pain, no spasm and normal pulse.  Lumbar spine: full ROM without pain or limitation; straight leg raises negative; toe and heel walking intact; marching intact.  Lymphadenopathy:    He has no cervical adenopathy.  Neurological: He is alert and oriented to person, place, and time. No cranial nerve deficit. He exhibits normal muscle tone. Coordination normal.  Mild R hand tremor with arm extension; no resting tremor.  Skin: Skin is warm and dry. No rash noted. He is not diaphoretic.  Psychiatric: He has a normal mood and affect. His behavior is normal.  Nursing note and vitals reviewed.  Results for orders placed or performed in visit on 08/07/14  CBC with Differential  Result Value Ref Range   WBC 10.6 (H)  4.0 - 10.5 K/uL   RBC 4.80 4.22 - 5.81 MIL/uL   Hemoglobin 15.4 13.0 - 17.0 g/dL   HCT 43.5 39.0 - 52.0 %   MCV 90.6 78.0 - 100.0 fL   MCH 32.1 26.0 - 34.0 pg   MCHC 35.4 30.0 - 36.0 g/dL   RDW 13.4 11.5 - 15.5 %   Platelets 346 150 - 400 K/uL   Neutrophils Relative % 68 43 - 77 %   Neutro Abs 7.2 1.7 - 7.7 K/uL   Lymphocytes Relative 24 12 - 46 %   Lymphs Abs 2.5 0.7 - 4.0 K/uL   Monocytes Relative 7 3 - 12 %   Monocytes Absolute 0.7 0.1 - 1.0 K/uL   Eosinophils Relative 1 0 - 5 %   Eosinophils Absolute 0.1 0.0 - 0.7 K/uL   Basophils Relative 0  0 - 1 %   Basophils Absolute 0.0 0.0 - 0.1 K/uL   Smear Review Criteria for review not met    MPV 10.0 9.4 - 12.4 fL  COMPLETE METABOLIC PANEL WITH GFR  Result Value Ref Range   Sodium 136 135 - 145 mEq/L   Potassium 3.8 3.5 - 5.3 mEq/L   Chloride 99 96 - 112 mEq/L   CO2 29 19 - 32 mEq/L   Glucose, Bld 114 (H) 70 - 99 mg/dL   BUN 20 6 - 23 mg/dL   Creat 1.06 0.50 - 1.35 mg/dL   Total Bilirubin 0.3 0.2 - 1.2 mg/dL   Alkaline Phosphatase 77 39 - 117 U/L   AST 30 0 - 37 U/L   ALT 34 0 - 53 U/L   Total Protein 7.2 6.0 - 8.3 g/dL   Albumin 4.4 3.5 - 5.2 g/dL   Calcium 9.9 8.4 - 10.5 mg/dL   GFR, Est African American 83 mL/min   GFR, Est Non African American 72 mL/min       Assessment & Plan:   1. Essential hypertension   2. Essential tremor   3. Bilateral low back pain without sciatica     1. HTN: moderately controlled; no changes to management today; obtain labs; follow-up six months. 2.  Essential tremor:  New.  S/p neurology consultation; 75% improvement in tremor with Primidone. 3.  Lower back pain: New.  B pain; no radicular symptoms; no nighttime awakening; pt declined xray in office. Recommend rest, daily stretching. If no improvement at next visit, obtain LS spine films.    Meds ordered this encounter  Medications  . primidone (MYSOLINE) 50 MG tablet    Sig: Take by mouth 4 (four) times daily.    Return in about  6 months (around 02/05/2015) for complete physical examiniation.    Kristi Smith, M.D.  [facility]  [place] [address] [place], [place]  [address] [phone] phone [phone] fax  

## 2014-08-07 NOTE — Progress Notes (Signed)
Subjective:    Patient ID: Carlos Hunt, male    DOB: [birth year], 68 y.o.   MRN: [id number] Patient Active Problem List   Diagnosis Date Noted  . Essential tremor 08/07/2014  . Essential hypertension, benign 02/14/2013   Prior to Admission medications   Medication Sig Start Date End Date Taking? Authorizing Provider  aspirin 325 MG tablet Take 325 mg by mouth daily.   Yes Historical Provider, MD  Cholecalciferol (HM VITAMIN D3) 2000 UNITS CAPS Take 2,000 Units by mouth.   Yes Historical Provider, MD  Echinacea 400 MG CAPS Take 400 mg by mouth daily.   Yes Historical Provider, MD  GLUCOSAMINE-CHONDROIT-MSM-C-MN PO Take by mouth.   Yes Historical Provider, MD  hydrochlorothiazide (HYDRODIURIL) 25 MG tablet Take 1 tablet (25 mg total) by mouth daily. 02/06/14  Yes Carlos Honour, MD  lisinopril (PRINIVIL,ZESTRIL) 10 MG tablet Take 1 tablet (10 mg total) by mouth daily. 02/06/14  Yes Carlos Honour, MD  Multiple Vitamins-Minerals (CENTRUM PO) Take by mouth.   Yes Historical Provider, MD  Omega-3 Fatty Acids (ULTRA OMEGA 3 PO) Take 1,000 mg by mouth 2 (two) times daily.   Yes Historical Provider, MD  primidone (MYSOLINE) 50 MG tablet Take by mouth 4 (four) times daily.   Yes Historical Provider, MD  vitamin B-12 (CYANOCOBALAMIN) 1000 MCG tablet Take 3,000 mcg by mouth daily.   Yes Historical Provider, MD   Allergies  Allergen Reactions  . Other Nausea And Vomiting    NOVACAINE   HPI  This is a 68 year old male presenting for follow up of hypertension.  HTN: managed with HCTZ 25 mg and lisinopril 10 mg. He doesn't take BP at home.   RUE tremor:  He was referred to neurology last visit - saw Dr. Manuella Hunt at [facility] on 6/25. He was put on primidone 50 mg QID which has helped. He reports he had problems initially with sedation but this has gotten better. He states his dad and his brother had parkinson's disease and his daughter was diagnosed with an essential tremor in her mid 59s.  He is  reporting he is having some intermittent lower back pain for past 3-4 months. He reports it does not bother him too much. He is not having any radiation of pain, paresthesias, weakness or problems with bowel/bladder. He has not tried anything for the pain.  Review of Systems  Constitutional: Negative.   Eyes: Negative for visual disturbance.  Respiratory: Negative for shortness of breath.   Cardiovascular: Negative for chest pain and leg swelling.  Gastrointestinal: Negative for nausea, vomiting, abdominal pain, diarrhea and constipation.  Genitourinary: Negative for difficulty urinating.  Musculoskeletal: Positive for back pain.  Skin: Negative for rash.  Neurological: Positive for tremors. Negative for dizziness, numbness and headaches.      Objective:   Physical Exam  Constitutional: He is oriented to person, place, and time. He appears well-developed and well-nourished. No distress.  HENT:  Head: Normocephalic and atraumatic.  Right Ear: Hearing normal.  Left Ear: Hearing normal.  Nose: Nose normal.  Eyes: Conjunctivae and lids are normal. Right eye exhibits no discharge. Left eye exhibits no discharge. No scleral icterus.  Cardiovascular: Normal rate, regular rhythm, normal heart sounds, intact distal pulses and normal pulses.   No murmur heard. Pulmonary/Chest: Effort normal and breath sounds normal. No respiratory distress. He has no wheezes. He has no rhonchi. He has no rales.  Musculoskeletal: Normal range of motion.  Lumbar back: Normal. He exhibits normal range of motion and no tenderness.  Neurological: He is alert and oriented to person, place, and time. He has normal reflexes.  Bilateral UE tremor present R>L. Tremor increases with intention.   Skin: Skin is warm, dry and intact. No lesion and no rash noted.  Psychiatric: He has a normal mood and affect. His speech is normal and behavior is normal. Thought content normal.   BP 154/68 mmHg  Pulse 52  Temp(Src)  98.3 F (36.8 C) (Oral)  Resp 16  Ht 5' 9"  (1.753 m)  Wt 196 lb 3.2 oz (88.996 kg)  BMI 28.96 kg/m2  SpO2 99%     Assessment & Plan:  1. Essential hypertension Continue with HCTZ and lisinopril. Advise buying a BP monitor and recording home BP.  - CBC with Differential - COMPLETE METABOLIC PANEL WITH GFR  2. Essential tremor He is on primidone and tremor is improved. Follow up with neurology.  3. Bilateral low back pain without sciatica Not affecting his QOL.He is not currently having pain. He will do stretches and decrease activity when having pain.   Carlos Hunt Carlos Hunt, MHS [facility]  08/07/2014

## 2014-08-08 LAB — COMPLETE METABOLIC PANEL WITH GFR
ALT: 34 U/L (ref 0–53)
AST: 30 U/L (ref 0–37)
Albumin: 4.4 g/dL (ref 3.5–5.2)
Alkaline Phosphatase: 77 U/L (ref 39–117)
BUN: 20 mg/dL (ref 6–23)
CO2: 29 mEq/L (ref 19–32)
Calcium: 9.9 mg/dL (ref 8.4–10.5)
Chloride: 99 mEq/L (ref 96–112)
Creat: 1.06 mg/dL (ref 0.50–1.35)
GFR, Est African American: 83 mL/min
GFR, Est Non African American: 72 mL/min
Glucose, Bld: 114 mg/dL — ABNORMAL HIGH (ref 70–99)
Potassium: 3.8 mEq/L (ref 3.5–5.3)
Sodium: 136 mEq/L (ref 135–145)
Total Bilirubin: 0.3 mg/dL (ref 0.2–1.2)
Total Protein: 7.2 g/dL (ref 6.0–8.3)

## 2014-10-02 ENCOUNTER — Ambulatory Visit
Admission: RE | Admit: 2014-10-02 | Discharge: 2014-10-02 | Disposition: A | Discharge status: Home / Self Care | Payer: PRIVATE HEALTH INSURANCE | Source: Ambulatory Visit | Attending: Emergency Medicine | Admitting: Emergency Medicine

## 2014-10-02 ENCOUNTER — Other Ambulatory Visit: Payer: Self-pay | Admitting: Emergency Medicine

## 2014-10-02 ENCOUNTER — Ambulatory Visit (INDEPENDENT_AMBULATORY_CARE_PROVIDER_SITE_OTHER): Payer: Managed Care, Other (non HMO) | Admitting: Emergency Medicine

## 2014-10-02 VITALS — BP 150/60 | HR 48 | Temp 97.6°F | Resp 16 | Ht 71.0 in | Wt 198.6 lb

## 2014-10-02 DIAGNOSIS — T1590XA Foreign body on external eye, part unspecified, unspecified eye, initial encounter: Secondary | ICD-10-CM

## 2014-10-02 DIAGNOSIS — S060X1A Concussion with loss of consciousness of 30 minutes or less, initial encounter: Secondary | ICD-10-CM

## 2014-10-02 DIAGNOSIS — R269 Unspecified abnormalities of gait and mobility: Secondary | ICD-10-CM

## 2014-10-02 DIAGNOSIS — S0990XA Unspecified injury of head, initial encounter: Secondary | ICD-10-CM

## 2014-10-02 DIAGNOSIS — G44309 Post-traumatic headache, unspecified, not intractable: Secondary | ICD-10-CM

## 2014-10-02 NOTE — Progress Notes (Signed)
[facility] [address], [place] Hunt [address] [phone]  Date:  10/02/2014   Name:  Carlos Hunt   DOB:  [birth year]   MRN:  [id number]  PCP:  [name], MD    Chief Complaint: Fall   History of Present Illness:  [name] is a 69 y.o. very pleasant male patient who presents with the following:  Slipped and fell Friday morning and hit his occiput on concrete.  Says he was knocked out for an indefinite period of time No nausea or vomiting.  No neuro or visual symptoms.   Has persistent posterior headache.   No neck pain or limitation of motion No improvement with over the counter medications or other home remedies. Denies other complaint or health concern today.   Patient Active Problem List   Diagnosis Date Noted  . Essential tremor 08/07/2014  . Essential hypertension, benign 02/14/2013    Past Medical History  Diagnosis Date  . Hypertension   . Hyperlipidemia   . Pain in left knee   . Blood in stool   . Pain, joint, shoulder   . Tremor   . GERD (gastroesophageal reflux disease)   . Erectile dysfunction   . AR (allergic rhinitis)   . Tobacco use disorder   . Abnormal involuntary movement   . Hx of colonic polyps   . Unspecified vitamin D deficiency   . Testicular hypofunction   . Allergy     Past Surgical History  Procedure Laterality Date  . Colonoscopy  09/09/2011    normal.  Previous polyps.  [name]. Repeat 5 years.  . Left hand surgery  2011    trauma    History  Substance Use Topics  . Smoking status: Former Smoker -- 3.00 packs/day for 10 years    Quit date: 09/24/1971  . Smokeless tobacco: Not on file     Comment: quit in the 1970's  . Alcohol Use: Yes     Comment: occasional; once every 6 months    Family History  Problem Relation Age of Onset  . Dementia Mother   . Parkinson's disease Father   . Cancer Father 97    prostate  . Heart disease Father 56    CAD/CABG  . Hypertension Father   . Hyperlipidemia  Father   . Diabetes Sister   . Parkinson's disease Brother     Allergies  Allergen Reactions  . Other Nausea And Vomiting    NOVACAINE    Medication list has been reviewed and updated.  Current Outpatient Prescriptions on File Prior to Visit  Medication Sig Dispense Refill  . aspirin 325 MG tablet Take 325 mg by mouth daily.    . Cholecalciferol (HM VITAMIN D3) 2000 UNITS CAPS Take 2,000 Units by mouth.    . Echinacea 400 MG CAPS Take 400 mg by mouth daily.    Marland Kitchen GLUCOSAMINE-CHONDROIT-MSM-C-MN PO Take by mouth.    . hydrochlorothiazide (HYDRODIURIL) 25 MG tablet Take 1 tablet (25 mg total) by mouth daily. 30 tablet 11  . lisinopril (PRINIVIL,ZESTRIL) 10 MG tablet Take 1 tablet (10 mg total) by mouth daily. 30 tablet 11  . Multiple Vitamins-Minerals (CENTRUM PO) Take by mouth.    . Omega-3 Fatty Acids (ULTRA OMEGA 3 PO) Take 1,000 mg by mouth 2 (two) times daily.    . primidone (MYSOLINE) 50 MG tablet Take by mouth 4 (four) times daily.    . vitamin B-12 (CYANOCOBALAMIN) 1000 MCG tablet Take 3,000 mcg by mouth daily.  No current facility-administered medications on file prior to visit.    Review of Systems:  As per HPI, otherwise negative.    Physical Examination: Filed Vitals:   10/02/14 1242  BP: 150/60  Pulse: 48  Temp: 97.6 F (36.4 C)  Resp: 16   Filed Vitals:   10/02/14 1242  Height: 5' 11"  (1.803 m)  Weight: 198 lb 9.6 oz (90.084 kg)   Body mass index is 27.71 kg/(m^2). Ideal Body Weight: Weight in (lb) to have BMI = 25: 178.9  GEN: WDWN, NAD, Non-toxic, A & O x 3 HEENT: Atraumatic, Normocephalic. Neck supple. No masses, No LAD. Ears and Nose: No external deformity. CV: RRR, No M/G/R. No JVD. No thrill. No extra heart sounds. PULM: CTA B, no wheezes, crackles, rhonchi. No retractions. No resp. distress. No accessory muscle use. ABD: S, NT, ND, +BS. No rebound. No HSM. EXTR: No c/c/e NEURO Normal gait. Romberg negative mildly impaired tandem gait  CN  2-12 intact  PRRERLA EOMI PSYCH: Normally interactive. Conversant. Not depressed or anxious appearing.  Calm demeanor.    Assessment and Plan: Concussion with LOC Post concussion headache CT  Signed,  Ellison Carwin, MD

## 2014-10-02 NOTE — Patient Instructions (Signed)
Go to [facility] [address] Ph# [phone] today at 5:50 pm for MRI Head.

## 2014-10-09 ENCOUNTER — Telehealth: Payer: Self-pay

## 2014-10-09 NOTE — Telephone Encounter (Signed)
Pt would like to speak with Dr.Smith regarding his pulse rate, states it hasn't been over 48. i advised pt to come in but he stated he felt ok, but if it was necessary, he would come in Please call [phone]

## 2014-10-09 NOTE — Telephone Encounter (Signed)
His heart rate has been at that level for two years.  What are his concerns?

## 2014-10-09 NOTE — Telephone Encounter (Signed)
FYI Pt does not care to RTC this is around the same pulse rate pt had in office at last visit.

## 2014-11-13 ENCOUNTER — Ambulatory Visit (INDEPENDENT_AMBULATORY_CARE_PROVIDER_SITE_OTHER): Payer: Managed Care, Other (non HMO) | Admitting: Family Medicine

## 2014-11-13 ENCOUNTER — Encounter: Payer: Self-pay | Admitting: Family Medicine

## 2014-11-13 VITALS — BP 163/71 | HR 57 | Temp 98.0°F | Resp 16 | Ht 69.0 in | Wt 194.4 lb

## 2014-11-13 DIAGNOSIS — N4889 Other specified disorders of penis: Secondary | ICD-10-CM | POA: Diagnosis not present

## 2014-11-13 DIAGNOSIS — I10 Essential (primary) hypertension: Secondary | ICD-10-CM | POA: Diagnosis not present

## 2014-11-13 DIAGNOSIS — Z23 Encounter for immunization: Secondary | ICD-10-CM

## 2014-11-13 DIAGNOSIS — N489 Disorder of penis, unspecified: Secondary | ICD-10-CM

## 2014-11-13 DIAGNOSIS — G250 Essential tremor: Secondary | ICD-10-CM | POA: Diagnosis not present

## 2014-11-13 DIAGNOSIS — Z125 Encounter for screening for malignant neoplasm of prostate: Secondary | ICD-10-CM

## 2014-11-13 DIAGNOSIS — Z0000 Encounter for general adult medical examination without abnormal findings: Secondary | ICD-10-CM | POA: Diagnosis not present

## 2014-11-13 DIAGNOSIS — Z131 Encounter for screening for diabetes mellitus: Secondary | ICD-10-CM | POA: Diagnosis not present

## 2014-11-13 LAB — POCT URINALYSIS DIPSTICK
Bilirubin, UA: NEGATIVE
Blood, UA: NEGATIVE
Glucose, UA: NEGATIVE
Ketones, UA: NEGATIVE
Leukocytes, UA: NEGATIVE
Nitrite, UA: NEGATIVE
Protein, UA: NEGATIVE
Spec Grav, UA: 1.02
Urobilinogen, UA: 0.2
pH, UA: 7

## 2014-11-13 LAB — CBC WITH DIFFERENTIAL/PLATELET
Basophils Absolute: 0.1 10*3/uL (ref 0.0–0.1)
Basophils Relative: 1 % (ref 0–1)
Eosinophils Absolute: 0.2 10*3/uL (ref 0.0–0.7)
Eosinophils Relative: 2 % (ref 0–5)
HCT: 45.9 % (ref 39.0–52.0)
Hemoglobin: 15.7 g/dL (ref 13.0–17.0)
Lymphocytes Relative: 22 % (ref 12–46)
Lymphs Abs: 2.1 10*3/uL (ref 0.7–4.0)
MCH: 31.3 pg (ref 26.0–34.0)
MCHC: 34.2 g/dL (ref 30.0–36.0)
MCV: 91.4 fL (ref 78.0–100.0)
MPV: 10.6 fL (ref 8.6–12.4)
Monocytes Absolute: 0.7 10*3/uL (ref 0.1–1.0)
Monocytes Relative: 7 % (ref 3–12)
Neutro Abs: 6.5 10*3/uL (ref 1.7–7.7)
Neutrophils Relative %: 68 % (ref 43–77)
Platelets: 337 10*3/uL (ref 150–400)
RBC: 5.02 MIL/uL (ref 4.22–5.81)
RDW: 13.4 % (ref 11.5–15.5)
WBC: 9.5 10*3/uL (ref 4.0–10.5)

## 2014-11-13 MED ORDER — HYDROCHLOROTHIAZIDE 25 MG PO TABS: 25.0000 mg | ORAL_TABLET | Freq: Every day | ORAL | Status: DC

## 2014-11-13 MED ORDER — LISINOPRIL 10 MG PO TABS: 10.0000 mg | ORAL_TABLET | Freq: Every day | ORAL | Status: DC

## 2014-11-13 MED ORDER — NYSTATIN 100000 UNIT/GM EX CREA: 1.0000 "application " | TOPICAL_CREAM | Freq: Two times a day (BID) | CUTANEOUS | Status: DC

## 2014-11-13 NOTE — Patient Instructions (Signed)

## 2014-11-13 NOTE — Addendum Note (Signed)
Addended by: Wardell Honour on: 11/13/2014 03:56 PM   Modules accepted: Level of Service

## 2014-11-13 NOTE — Progress Notes (Signed)
Subjective:    Patient ID: Carlos Hunt, male    DOB: July 27, 1946, 68 y.o.   MRN: [id number]  HPI This 69 y.o. male presents for Complete Physical Examination.  Last physical:  02/06/2014 Colonoscopy:  2013; repeat in 5 years; previous polyps. [name]. TDAP:  09/23/2012 Pneumovax:  Never/refuses.   Zostavax: never; refuses; no previous chicken pox.   Influenza:   06/08/2014 Eye exam:  10/2014; [place]; early cataracts; no glaucoma. Dental exam: every six months.  HTN:  Patient reports good compliance with medication, good tolerance to medication, and good symptom control.  Home BP machine is almost identical to office BP readings.  Checked Bp at home before appointment; 147/67 P 72.  120/62-147/60.  Essential tremor:  S/p follow-up with Manuella Ghazi.  Manuella Ghazi reviewed MRI results of brain.    Concussion/head trauma with LOC:  S/p MRI brain. Occurred 10/02/14.  Evaluated by Dr. Ouida Sills. Daily headaches for one month.    Rash penile: present for one week; applying peroxide to area with improvement.  +burning and tingling initially.   Review of Systems  Constitutional: Negative.  Negative for fever, chills, diaphoresis, activity change, appetite change, fatigue and unexpected weight change.  HENT: Negative for congestion, dental problem, drooling, ear discharge, ear pain, facial swelling, hearing loss, mouth sores, nosebleeds, postnasal drip, rhinorrhea, sinus pressure, sneezing, sore throat, tinnitus, trouble swallowing and voice change.   Eyes: Negative.  Negative for photophobia, pain, discharge, redness, itching and visual disturbance.  Respiratory: Negative.  Negative for apnea, cough, choking, chest tightness, shortness of breath, wheezing and stridor.   Cardiovascular: Negative.  Negative for chest pain, palpitations and leg swelling.  Gastrointestinal: Negative.  Negative for nausea, vomiting, abdominal pain, diarrhea, constipation and blood in stool.  Endocrine: Negative.  Negative for cold  intolerance, heat intolerance, polydipsia, polyphagia and polyuria.  Genitourinary: Negative.  Negative for dysuria, urgency, frequency, hematuria, flank pain, decreased urine volume, discharge, penile swelling, scrotal swelling, enuresis, difficulty urinating, genital sores, penile pain and testicular pain.  Musculoskeletal: Negative.  Negative for myalgias, back pain, joint swelling, arthralgias, gait problem, neck pain and neck stiffness.  Skin: Negative.  Negative for color change, pallor, rash and wound.  Allergic/Immunologic: Positive for environmental allergies. Negative for food allergies and immunocompromised state.  Neurological: Positive for tremors and headaches. Negative for dizziness, seizures, syncope, facial asymmetry, speech difficulty, weakness, light-headedness and numbness.  Hematological: Negative.  Negative for adenopathy. Does not bruise/bleed easily.  Psychiatric/Behavioral: Negative.  Negative for suicidal ideas, hallucinations, behavioral problems, confusion, sleep disturbance, self-injury, dysphoric mood, decreased concentration and agitation. The patient is not nervous/anxious and is not hyperactive.    Past Medical History  Diagnosis Date  . Hypertension   . Hyperlipidemia   . Pain in left knee   . Blood in stool   . Pain, joint, shoulder   . Tremor   . GERD (gastroesophageal reflux disease)   . Erectile dysfunction   . AR (allergic rhinitis)   . Tobacco use disorder   . Abnormal involuntary movement   . Hx of colonic polyps   . Unspecified vitamin D deficiency   . Testicular hypofunction   . Allergy    Past Surgical History  Procedure Laterality Date  . Colonoscopy  09/09/2011    normal.  Previous polyps.  [name]. Repeat 5 years.  . Left hand surgery  2011    trauma   Allergies  Allergen Reactions  . Other Nausea And Vomiting    NOVACAINE   History   Social  History  . Marital Status: Married    Spouse Name: N/A  . Number of Children: 4  . Years  of Education: N/A   Occupational History  . building maintenance     [employer]   Social History Main Topics  . Smoking status: Former Smoker -- 3.00 packs/day for 10 years    Quit date: 09/24/1971  . Smokeless tobacco: Not on file     Comment: quit in the 1970's  . Alcohol Use: Yes     Comment: occasional; once every 6 months  . Drug Use: No  . Sexual Activity: Yes   Other Topics Concern  . Not on file   Social History Narrative   Marital status: married x 44 years.      Children: 2 daughters, 2 sons; 6 grandchildren.      Lives: with wife, oldest daughter.      Employment:  [employer] x 12 years; moderately happy.  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      Tobacco: quit 42 years ago; smoked x 10 years.       Alcohol: socially; weekends.  Rarely.      Drugs: none      Exercise:  None; work is physical.        Seatbelt: 100%      Sunscreen: rare sun exposure      Guns:  4 gun; unloaded, not stored in locked cabinet.       Smoke alarm and carbon monoxide detector in the home.      Caffeine use: consumes a minimal amount   Living Will: patient does NOT have Living Will; Does NOT have HCPOA; getting ready to work on it   Organ Donor: NO         Family History  Problem Relation Age of Onset  . Dementia Mother   . Parkinson's disease Father   . Cancer Father 50    prostate  . Heart disease Father 7    CAD/CABG  . Hypertension Father   . Hyperlipidemia Father   . Diabetes Sister   . Parkinson's disease Brother        Objective:   Physical Exam  Constitutional: He is oriented to person, place, and time. He appears well-developed and well-nourished. No distress.  HENT:  Head: Normocephalic and atraumatic.  Right Ear: External ear normal.  Left Ear: External ear normal.  Nose: Nose normal.  Mouth/Throat: Oropharynx is clear and moist.  Eyes: Conjunctivae and EOM are normal. Pupils are equal, round, and reactive to light.  Neck: Normal range of  motion. Neck supple. Carotid bruit is not present. No thyromegaly present.  Cardiovascular: Normal rate, regular rhythm, normal heart sounds and intact distal pulses.  Exam reveals no gallop and no friction rub.   No murmur heard. Pulmonary/Chest: Effort normal and breath sounds normal. He has no wheezes. He has no rales.  Abdominal: Soft. Bowel sounds are normal. He exhibits no distension and no mass. There is no tenderness. There is no rebound and no guarding. Hernia confirmed negative in the right inguinal area and confirmed negative in the left inguinal area.  Genitourinary: Rectum normal, testes normal and penis normal.    Prostate is enlarged. Prostate is not tender. Right testis shows no mass, no swelling and no tenderness. Left testis shows no mass, no swelling and no tenderness. Circumcised.  Healing superficial ulceration 2-69m x 12m Musculoskeletal:       Right shoulder: Normal.  Left shoulder: Normal.       Cervical back: Normal.  Lymphadenopathy:    He has no cervical adenopathy.       Right: No inguinal adenopathy present.       Left: No inguinal adenopathy present.  Neurological: He is alert and oriented to person, place, and time. He has normal reflexes. He displays tremor. No cranial nerve deficit. He exhibits normal muscle tone. Coordination normal.  B upper extremities with tremor with extension of B hands.  Skin: Skin is warm and dry. No rash noted. He is not diaphoretic.  Psychiatric: He has a normal mood and affect. His behavior is normal. Judgment and thought content normal.     EKG: NSR; non-specific ST changes.    PREVNAR 13 ADMINISTERED.     Assessment & Plan:   1. Routine physical examination   2. Essential tremor   3. Essential hypertension, benign   4. Screening for prostate cancer   5. Screening for diabetes mellitus   6. Penile lesion   7. Need for pneumococcal vaccine     1.  Complete Physical Examination:  Anticipatory guidance--  exercise, weight management.  Colonoscopy UTD.  Discussed immunizations; s/p Prevnar 13 in office.  No evidence of depression. Independent with ADLs.  Low fall risk.  No advanced directives but currently working on formalizing. 2. HTN: controlled; obtain labs, u/a, EKG.  Refills provided. 3.  Essential tremor: stable; followed by neurology; continue Primidone. 4. Screening prostate cancer: completed DRE; obtain PSA. 5.  Screening diabetes: obtain glucose,HgbA1c. 6. Penile lesion: New.  Ddx HSV, candidiasis, syphilis. HSV culture obtained; treat with nystatin cream bid. 7. S/p Prevnar 13.    Meds ordered this encounter  Medications  . nystatin cream (MYCOSTATIN)    Sig: Apply 1 application topically 2 (two) times daily.    Dispense:  30 g    Refill:  0  . hydrochlorothiazide (HYDRODIURIL) 25 MG tablet    Sig: Take 1 tablet (25 mg total) by mouth daily.    Dispense:  30 tablet    Refill:  11  . lisinopril (PRINIVIL,ZESTRIL) 10 MG tablet    Sig: Take 1 tablet (10 mg total) by mouth daily.    Dispense:  30 tablet    Refill:  11    Norwood Levo, M.D. [facility] [address] [place], [place]  [address] [phone] phone [phone] fax

## 2014-11-14 LAB — COMPREHENSIVE METABOLIC PANEL
ALT: 32 U/L (ref 0–53)
AST: 25 U/L (ref 0–37)
Albumin: 4.3 g/dL (ref 3.5–5.2)
Alkaline Phosphatase: 74 U/L (ref 39–117)
BUN: 18 mg/dL (ref 6–23)
CO2: 28 mEq/L (ref 19–32)
Calcium: 10.2 mg/dL (ref 8.4–10.5)
Chloride: 99 mEq/L (ref 96–112)
Creat: 0.99 mg/dL (ref 0.50–1.35)
Glucose, Bld: 94 mg/dL (ref 70–99)
Potassium: 4.5 mEq/L (ref 3.5–5.3)
Sodium: 139 mEq/L (ref 135–145)
Total Bilirubin: 0.5 mg/dL (ref 0.2–1.2)
Total Protein: 7.1 g/dL (ref 6.0–8.3)

## 2014-11-14 LAB — LIPID PANEL
Cholesterol: 152 mg/dL (ref 0–200)
HDL: 45 mg/dL (ref >=40)
LDL Cholesterol: 80 mg/dL (ref 0–99)
Total CHOL/HDL Ratio: 3.4 Ratio
Triglycerides: 134 mg/dL (ref <150)
VLDL: 27 mg/dL (ref 0–40)

## 2014-11-14 LAB — HEMOGLOBIN A1C
Hgb A1c MFr Bld: 5.7 % — ABNORMAL HIGH (ref <5.7)
Mean Plasma Glucose: 117 mg/dL — ABNORMAL HIGH (ref <117)

## 2014-11-14 LAB — TSH: TSH: 2.276 u[IU]/mL (ref 0.350–4.500)

## 2014-11-14 LAB — PSA: PSA: 0.59 ng/mL (ref <=4.00)

## 2014-11-15 ENCOUNTER — Encounter: Payer: Self-pay | Admitting: Radiology

## 2014-11-19 LAB — HERPES CULTURE, RAPID

## 2015-02-05 ENCOUNTER — Encounter: Payer: Managed Care, Other (non HMO) | Admitting: Family Medicine

## 2015-05-16 ENCOUNTER — Ambulatory Visit: Payer: Managed Care, Other (non HMO) | Admitting: Family Medicine

## 2015-05-18 ENCOUNTER — Ambulatory Visit: Payer: Managed Care, Other (non HMO) | Admitting: Family Medicine

## 2015-05-25 ENCOUNTER — Ambulatory Visit (INDEPENDENT_AMBULATORY_CARE_PROVIDER_SITE_OTHER): Payer: Managed Care, Other (non HMO) | Admitting: Family Medicine

## 2015-05-25 ENCOUNTER — Encounter: Payer: Self-pay | Admitting: Family Medicine

## 2015-05-25 VITALS — BP 153/69 | HR 50 | Temp 97.4°F | Resp 16 | Wt 202.0 lb

## 2015-05-25 DIAGNOSIS — R079 Chest pain, unspecified: Secondary | ICD-10-CM | POA: Diagnosis not present

## 2015-05-25 DIAGNOSIS — Z1159 Encounter for screening for other viral diseases: Secondary | ICD-10-CM

## 2015-05-25 DIAGNOSIS — R0683 Snoring: Secondary | ICD-10-CM | POA: Diagnosis not present

## 2015-05-25 DIAGNOSIS — I10 Essential (primary) hypertension: Secondary | ICD-10-CM

## 2015-05-25 DIAGNOSIS — G250 Essential tremor: Secondary | ICD-10-CM | POA: Diagnosis not present

## 2015-05-25 DIAGNOSIS — Z23 Encounter for immunization: Secondary | ICD-10-CM | POA: Diagnosis not present

## 2015-05-25 DIAGNOSIS — R001 Bradycardia, unspecified: Secondary | ICD-10-CM | POA: Diagnosis not present

## 2015-05-25 LAB — COMPREHENSIVE METABOLIC PANEL
ALT: 25 U/L (ref 9–46)
AST: 21 U/L (ref 10–35)
Albumin: 4.4 g/dL (ref 3.6–5.1)
Alkaline Phosphatase: 75 U/L (ref 40–115)
BUN: 16 mg/dL (ref 7–25)
CO2: 27 mmol/L (ref 20–31)
Calcium: 9.5 mg/dL (ref 8.6–10.3)
Chloride: 101 mmol/L (ref 98–110)
Creat: 1.08 mg/dL (ref 0.70–1.25)
Glucose, Bld: 93 mg/dL (ref 65–99)
Potassium: 4 mmol/L (ref 3.5–5.3)
Sodium: 138 mmol/L (ref 135–146)
Total Bilirubin: 0.4 mg/dL (ref 0.2–1.2)
Total Protein: 6.8 g/dL (ref 6.1–8.1)

## 2015-05-25 LAB — HEPATITIS C ANTIBODY: HCV Ab: NEGATIVE

## 2015-05-25 NOTE — Patient Instructions (Signed)

## 2015-05-25 NOTE — Progress Notes (Signed)
Subjective:    Patient ID: Carlos Hunt, male    DOB: September 29, 1945, 69 y.o.   MRN: [id number]  05/25/2015  Hypertension and Medication Refill   HPI This 69 y.o. male presents for six month follow-up:   1. Hypertension: Patient reports good compliance with medication, good tolerance to medication, and good symptom control.  Home BP running 153/73, 153/67.  147/63.  Daughter in [place] and one in [place]; one son in [place]; one in [place].   2.  Benign essential tremor: Patient reports good compliance with medication, good tolerance to medication, and good symptom control.  No recent follow-up with neurology; doing well on medication.   3.  Snoring: wife is complaining; has tried nasal apparatus with mild relief.   +apnea witnessed by wife.  Only sleeps 6 hours per night; feels refreshed.  Some napping on weekends.  Falls asleep watching television.    SHIIP: Delle Reining started years ago; state funded program for all 100 counties in [place].  Educates on supplemental insurances.  http://www.taylor.net/  4. Chest pain: did suffer with chest pain while at work in past month; sat down to rest with improvement in symptoms; no recurrence; job very physically demanding. +SOB with chest pain; no radiation; +diaphoresis.    Review of Systems  Constitutional: Negative for fever, chills, diaphoresis, activity change, appetite change and fatigue.  Eyes: Negative for visual disturbance.  Respiratory: Positive for apnea. Negative for cough and shortness of breath.   Cardiovascular: Positive for chest pain. Negative for palpitations and leg swelling.  Endocrine: Negative for cold intolerance, heat intolerance, polydipsia, polyphagia and polyuria.  Neurological: Positive for tremors. Negative for dizziness, seizures, syncope, facial asymmetry, speech difficulty, weakness, light-headedness, numbness and headaches.    Past Medical History  Diagnosis Date  . Hypertension   . Hyperlipidemia   . Pain in left knee   .  Blood in stool   . Pain, joint, shoulder   . Tremor   . GERD (gastroesophageal reflux disease)   . Erectile dysfunction   . AR (allergic rhinitis)   . Tobacco use disorder   . Abnormal involuntary movement   . Hx of colonic polyps   . Unspecified vitamin D deficiency   . Testicular hypofunction   . Allergy   . Benign essential tremor     s/p neurology consultation by Brigitte Pulse in [place].   Past Surgical History  Procedure Laterality Date  . Colonoscopy  09/09/2011    normal.  Previous polyps.  [name]. Repeat 5 years.  . Left hand surgery  2011    trauma   Allergies  Allergen Reactions  . Other Nausea And Vomiting    NOVACAINE   Current Outpatient Prescriptions  Medication Sig Dispense Refill  . aspirin 325 MG tablet Take 325 mg by mouth daily.    . Cholecalciferol (HM VITAMIN D3) 2000 UNITS CAPS Take 2,000 Units by mouth.    . Echinacea 400 MG CAPS Take 400 mg by mouth daily.    Marland Kitchen GLUCOSAMINE-CHONDROIT-MSM-C-MN PO Take by mouth.    . hydrochlorothiazide (HYDRODIURIL) 25 MG tablet Take 1 tablet (25 mg total) by mouth daily. 30 tablet 11  . lisinopril (PRINIVIL,ZESTRIL) 10 MG tablet Take 1 tablet (10 mg total) by mouth daily. 30 tablet 11  . Multiple Vitamins-Minerals (CENTRUM PO) Take by mouth.    . Omega-3 Fatty Acids (ULTRA OMEGA 3 PO) Take 1,000 mg by mouth 2 (two) times daily.    . primidone (MYSOLINE) 50 MG tablet Take by mouth  4 (four) times daily.    . Cyanocobalamin (VITAMIN B12 PO) Take 3,000 mcg by mouth daily.     No current facility-administered medications for this visit.   Social History   Social History  . Marital Status: Married    Spouse Name: N/A  . Number of Children: 4  . Years of Education: N/A   Occupational History  . building maintenance     [employer]   Social History Main Topics  . Smoking status: Former Smoker -- 3.00 packs/day for 10 years    Quit date: 09/24/1971  . Smokeless tobacco: Not on file     Comment: quit in the 1970's   . Alcohol Use: Yes     Comment: occasional; once every 6 months  . Drug Use: No  . Sexual Activity: Yes   Other Topics Concern  . Not on file   Social History Narrative   Marital status: married x 44 years.      Children: 2 daughters, 2 sons; 6 grandchildren.      Lives: with wife, oldest daughter.      Employment:  [employer] x 12 years; moderately happy.  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      Tobacco: quit 42 years ago; smoked x 10 years.       Alcohol: socially; weekends.  Rarely.      Drugs: none      Exercise:  None; work is physical.        Seatbelt: 100%      Sunscreen: rare sun exposure      Guns:  4 gun; unloaded, not stored in locked cabinet.       Smoke alarm and carbon monoxide detector in the home.      Caffeine use: consumes a minimal amount   Living Will: patient does NOT have Living Will; Does NOT have HCPOA; getting ready to work on it   Organ Donor: NO         Family History  Problem Relation Age of Onset  . Dementia Mother   . Parkinson's disease Father   . Cancer Father 85    prostate  . Heart disease Father 8    CAD/CABG  . Hypertension Father   . Hyperlipidemia Father   . Diabetes Sister   . Parkinson's disease Brother        Objective:    BP 153/69 mmHg  Pulse 50  Temp(Src) 97.4 F (36.3 C) (Oral)  Resp 16  Wt 202 lb (91.627 kg) Physical Exam  Constitutional: He is oriented to person, place, and time. He appears well-developed and well-nourished. No distress.  HENT:  Head: Normocephalic and atraumatic.  Right Ear: External ear normal.  Left Ear: External ear normal.  Nose: Nose normal.  Mouth/Throat: Oropharynx is clear and moist.  Eyes: Conjunctivae and EOM are normal. Pupils are equal, round, and reactive to light.  Neck: Normal range of motion. Neck supple. Carotid bruit is not present. No thyromegaly present.  Cardiovascular: Normal rate, regular rhythm, normal heart sounds and intact distal pulses.  Exam reveals no  gallop and no friction rub.   No murmur heard. Pulmonary/Chest: Effort normal and breath sounds normal. He has no wheezes. He has no rales.  Abdominal: Soft. Bowel sounds are normal. He exhibits no distension and no mass. There is no tenderness. There is no rebound and no guarding.  Lymphadenopathy:    He has no cervical adenopathy.  Neurological: He is alert and oriented to person, place,  and time. He displays tremor. No cranial nerve deficit. He exhibits normal muscle tone. Coordination normal.  B upper extremities with intention tremor.  Skin: Skin is warm and dry. No rash noted. He is not diaphoretic.  Psychiatric: He has a normal mood and affect. His behavior is normal.  Nursing note and vitals reviewed.  Results for orders placed or performed in visit on 05/25/15  Comprehensive metabolic panel  Result Value Ref Range   Sodium 138 135 - 146 mmol/L   Potassium 4.0 3.5 - 5.3 mmol/L   Chloride 101 98 - 110 mmol/L   CO2 27 20 - 31 mmol/L   Glucose, Bld 93 65 - 99 mg/dL   BUN 16 7 - 25 mg/dL   Creat 1.08 0.70 - 1.25 mg/dL   Total Bilirubin 0.4 0.2 - 1.2 mg/dL   Alkaline Phosphatase 75 40 - 115 U/L   AST 21 10 - 35 U/L   ALT 25 9 - 46 U/L   Total Protein 6.8 6.1 - 8.1 g/dL   Albumin 4.4 3.6 - 5.1 g/dL   Calcium 9.5 8.6 - 10.3 mg/dL  Hepatitis C antibody  Result Value Ref Range   HCV Ab NEGATIVE NEGATIVE  CBC with Differential/Platelet  Result Value Ref Range   WBC 8.7 4.0 - 10.5 K/uL   RBC 4.53 4.22 - 5.81 MIL/uL   Hemoglobin 14.8 13.0 - 17.0 g/dL   HCT 43.5 39.0 - 52.0 %   MCV 96.0 78.0 - 100.0 fL   MCH 32.7 26.0 - 34.0 pg   MCHC 34.0 30.0 - 36.0 g/dL   RDW 13.9 11.5 - 15.5 %   Platelets 334 150 - 400 K/uL   MPV 10.5 8.6 - 12.4 fL   Neutrophils Relative % 60 43 - 77 %   Neutro Abs 5.2 1.7 - 7.7 K/uL   Lymphocytes Relative 29 12 - 46 %   Lymphs Abs 2.5 0.7 - 4.0 K/uL   Monocytes Relative 8 3 - 12 %   Monocytes Absolute 0.7 0.1 - 1.0 K/uL   Eosinophils Relative 2 0 - 5  %   Eosinophils Absolute 0.2 0.0 - 0.7 K/uL   Basophils Relative 1 0 - 1 %   Basophils Absolute 0.1 0.0 - 0.1 K/uL   Smear Review Criteria for review not met    INFLUENZA VACCINE ADMINISTERED.  EKG: NSR; BRADYCARDIA rate 48; NO ACUTE ST CHANGES.    Assessment & Plan:   1. Essential hypertension, benign   2. Essential tremor   3. Need for prophylactic vaccination and inoculation against influenza   4. Need for hepatitis C screening test   5. Snoring   6. Chest pain, unspecified chest pain type     1. HTN: uncontrolled; pt refuses increase in medication at this time; obtain labs; no changes to management. 2.  Essential tremor: improved with medication; s/p neurology consultation by Manuella Ghazi in [place]. 3.  Chest pain: New.  Exertional in nature.  Refer to cardiology.  To ED for recurrence. 4.  Snoring: New. With witnessed apnea; warrants sleep study; will refer to cardiology now; will refer for sleep study at next visit.  5.  Hepatitis C screening: obtain. 6. S/p flu vaccine.   Orders Placed This Encounter  Procedures  . Flu Vaccine QUAD 36+ mos IM  . Comprehensive metabolic panel  . Hepatitis C antibody  . CBC with Differential/Platelet  . Ambulatory referral to Cardiology    Referral Priority:  Routine    Referral Type:  Consultation    Referral Reason:  Specialty Services Required    Referred to Provider:  Wellington Hampshire, MD    Requested Specialty:  Cardiology    Number of Visits Requested:  1  . EKG 12-Lead   No orders of the defined types were placed in this encounter.    Return in about 6 months (around 11/22/2015) for complete physical examiniation.   Kristi Elayne Guerin, M.D. [facility] [address] [place], [place]  [address] [phone] phone [phone] fax

## 2015-05-26 LAB — CBC WITH DIFFERENTIAL/PLATELET
Basophils Absolute: 0.1 10*3/uL (ref 0.0–0.1)
Basophils Relative: 1 % (ref 0–1)
Eosinophils Absolute: 0.2 10*3/uL (ref 0.0–0.7)
Eosinophils Relative: 2 % (ref 0–5)
HCT: 43.5 % (ref 39.0–52.0)
Hemoglobin: 14.8 g/dL (ref 13.0–17.0)
Lymphocytes Relative: 29 % (ref 12–46)
Lymphs Abs: 2.5 10*3/uL (ref 0.7–4.0)
MCH: 32.7 pg (ref 26.0–34.0)
MCHC: 34 g/dL (ref 30.0–36.0)
MCV: 96 fL (ref 78.0–100.0)
MPV: 10.5 fL (ref 8.6–12.4)
Monocytes Absolute: 0.7 10*3/uL (ref 0.1–1.0)
Monocytes Relative: 8 % (ref 3–12)
Neutro Abs: 5.2 10*3/uL (ref 1.7–7.7)
Neutrophils Relative %: 60 % (ref 43–77)
Platelets: 334 10*3/uL (ref 150–400)
RBC: 4.53 MIL/uL (ref 4.22–5.81)
RDW: 13.9 % (ref 11.5–15.5)
WBC: 8.7 10*3/uL (ref 4.0–10.5)

## 2015-06-05 ENCOUNTER — Encounter: Payer: Self-pay | Admitting: Cardiovascular Disease

## 2015-06-05 ENCOUNTER — Ambulatory Visit (INDEPENDENT_AMBULATORY_CARE_PROVIDER_SITE_OTHER): Payer: PRIVATE HEALTH INSURANCE | Admitting: Cardiovascular Disease

## 2015-06-05 VITALS — BP 132/74 | HR 58 | Ht 71.0 in | Wt 202.8 lb

## 2015-06-05 DIAGNOSIS — R079 Chest pain, unspecified: Secondary | ICD-10-CM | POA: Insufficient documentation

## 2015-06-05 DIAGNOSIS — R001 Bradycardia, unspecified: Secondary | ICD-10-CM | POA: Diagnosis not present

## 2015-06-05 DIAGNOSIS — I10 Essential (primary) hypertension: Secondary | ICD-10-CM

## 2015-06-05 NOTE — Assessment & Plan Note (Signed)
The patient had one isolated episode of chest pain which had anginal features. No recurrent symptoms since then. ECG is without acute changes. Given his risk factors for coronary artery disease, I recommend evaluation with a treadmill nuclear stress test. I discussed with the patient the importance of lifestyle changes in order to decrease the chance of future coronary artery disease and cardiovascular events. We discussed the importance of controlling risk factors, healthy diet as well as regular exercise. I also explained to him that a normal stress test does not rule out atherosclerosis.

## 2015-06-05 NOTE — Assessment & Plan Note (Signed)
He has no symptoms related to this. Avoid medications that can cause bradycardia.

## 2015-06-05 NOTE — Patient Instructions (Signed)
Your physician recommends that you continue on your current medications as directed. Please refer to the Current Medication list given to you today.  Your physician has requested that you have en exercise stress myoview. For further information please visit [email or web address]. Please follow instruction sheet, as given.  Your physician recommends that you schedule a follow-up appointment as needed with Dr. Fletcher Anon.

## 2015-06-05 NOTE — Assessment & Plan Note (Signed)
Blood pressure is reasonably controlled on current medications. 

## 2015-06-05 NOTE — Progress Notes (Signed)
Primary care physician: Dr. [name]  HPI  This is a pleasant 69 year old male who was referred for evaluation of chest pain. He has no previous cardiac history. He has known history of hypertension and hyperlipidemia. He is not diabetic and does not smoke. He does have family history of coronary artery disease but not prematurely. He is physically very active at work. About 3 weeks ago, he had an episode of substernal chest pressure which lasted for about 15 minutes while he was working. It subsided with rest and since then he has not had any recurrent similar symptoms. He denies any previous similar episodes. He reports stable chronic exertional dyspnea. No orthopnea, PND. He denies palpitations or syncope.    Allergies  Allergen Reactions  . Other Nausea And Vomiting    NOVACAINE     Current Outpatient Prescriptions on File Prior to Visit  Medication Sig Dispense Refill  . aspirin 325 MG tablet Take 325 mg by mouth daily.    . Cholecalciferol (HM VITAMIN D3) 2000 UNITS CAPS Take 2,000 Units by mouth.    . Echinacea 400 MG CAPS Take 400 mg by mouth daily.    Marland Kitchen GLUCOSAMINE-CHONDROIT-MSM-C-MN PO Take by mouth.    . hydrochlorothiazide (HYDRODIURIL) 25 MG tablet Take 1 tablet (25 mg total) by mouth daily. 30 tablet 11  . lisinopril (PRINIVIL,ZESTRIL) 10 MG tablet Take 1 tablet (10 mg total) by mouth daily. 30 tablet 11  . Multiple Vitamins-Minerals (CENTRUM PO) Take by mouth.    . Omega-3 Fatty Acids (ULTRA OMEGA 3 PO) Take 1,000 mg by mouth 2 (two) times daily.    . primidone (MYSOLINE) 50 MG tablet Take by mouth 4 (four) times daily.     No current facility-administered medications on file prior to visit.     Past Medical History  Diagnosis Date  . Hypertension   . Hyperlipidemia   . Pain in left knee   . Blood in stool   . Pain, joint, shoulder   . Tremor   . GERD (gastroesophageal reflux disease)   . Erectile dysfunction   . AR (allergic rhinitis)   . Tobacco use  disorder   . Abnormal involuntary movement   . Hx of colonic polyps   . Unspecified vitamin D deficiency   . Testicular hypofunction   . Allergy   . Benign essential tremor     s/p neurology consultation by Brigitte Pulse in [place].     Past Surgical History  Procedure Laterality Date  . Colonoscopy  09/09/2011    normal.  Previous polyps.  [name]. Repeat 5 years.  . Left hand surgery  2011    trauma     Family History  Problem Relation Age of Onset  . Dementia Mother   . Parkinson's disease Father   . Cancer Father 11    prostate  . Heart disease Father 60    CAD/CABG  . Hypertension Father   . Hyperlipidemia Father   . Diabetes Sister   . Parkinson's disease Brother      Social History   Social History  . Marital Status: Married    Spouse Name: N/A  . Number of Children: 4  . Years of Education: N/A   Occupational History  . building maintenance     [employer]   Social History Main Topics  . Smoking status: Former Smoker -- 3.00 packs/day for 10 years    Quit date: 09/24/1971  . Smokeless tobacco: Not on file     Comment:  quit in the 1970's  . Alcohol Use: Yes     Comment: occasional; once every 6 months  . Drug Use: No  . Sexual Activity: Yes   Other Topics Concern  . Not on file   Social History Narrative   Marital status: married x 44 years.      Children: 2 daughters, 2 sons; 6 grandchildren.      Lives: with wife, oldest daughter.      Employment:  [employer] x 12 years; moderately happy.  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      Tobacco: quit 42 years ago; smoked x 10 years.       Alcohol: socially; weekends.  Rarely.      Drugs: none      Exercise:  None; work is physical.        Seatbelt: 100%      Sunscreen: rare sun exposure      Guns:  4 gun; unloaded, not stored in locked cabinet.       Smoke alarm and carbon monoxide detector in the home.      Caffeine use: consumes a minimal amount   Living Will: patient does NOT have  Living Will; Does NOT have HCPOA; getting ready to work on it   Organ Donor: NO           ROS A 10 point review of system was performed. It is negative other than that mentioned in the history of present illness.   PHYSICAL EXAM   BP 132/74 mmHg  Pulse 58  Ht 5' 11"  (1.803 m)  Wt 202 lb 12.8 oz (91.989 kg)  BMI 28.30 kg/m2 Constitutional: He is oriented to person, place, and time. He appears well-developed and well-nourished. No distress.  HENT: No nasal discharge.  Head: Normocephalic and atraumatic.  Eyes: Pupils are equal and round.  No discharge. Neck: Normal range of motion. Neck supple. No JVD present. No thyromegaly present.  Cardiovascular: Normal rate, regular rhythm, normal heart sounds. Exam reveals no gallop and no friction rub. No murmur heard.  Pulmonary/Chest: Effort normal and breath sounds normal. No stridor. No respiratory distress. He has no wheezes. He has no rales. He exhibits no tenderness.  Abdominal: Soft. Bowel sounds are normal. He exhibits no distension. There is no tenderness. There is no rebound and no guarding.  Musculoskeletal: Normal range of motion. He exhibits no edema and no tenderness.  Neurological: He is alert and oriented to person, place, and time. Coordination normal.  Skin: Skin is warm and dry. No rash noted. He is not diaphoretic. No erythema. No pallor.  Psychiatric: He has a normal mood and affect. His behavior is normal. Judgment and thought content normal.       EKG: Recent EKG was reviewed which showed sinus bradycardia with a heart rate of 48 bpm with no significant ST or T wave changes.   ASSESSMENT AND PLAN

## 2015-06-20 ENCOUNTER — Telehealth (HOSPITAL_COMMUNITY): Payer: Self-pay | Admitting: *Deleted

## 2015-06-20 NOTE — Telephone Encounter (Signed)
Left message  in reference to upcoming appointment scheduled for 06/22/15. Phone number given for a call back so details instructions can be given. Hubbard Robinson, RN

## 2015-06-22 ENCOUNTER — Ambulatory Visit (HOSPITAL_COMMUNITY): Payer: PRIVATE HEALTH INSURANCE | Attending: Internal Medicine

## 2015-06-22 DIAGNOSIS — R079 Chest pain, unspecified: Secondary | ICD-10-CM | POA: Diagnosis not present

## 2015-06-22 DIAGNOSIS — I10 Essential (primary) hypertension: Secondary | ICD-10-CM | POA: Diagnosis not present

## 2015-06-22 LAB — MYOCARDIAL PERFUSION IMAGING
Estimated workload: 7 METS
Exercise duration (min): 6 min
Exercise duration (sec): 0 s
LV dias vol: 106 mL
LV sys vol: 49 mL
MPHR: 151 {beats}/min
Peak HR: 141 {beats}/min
Percent HR: 93 %
RATE: 0.29
RPE: 13
Rest HR: 49 {beats}/min
SDS: 0
SRS: 2
SSS: 2
TID: 1.1

## 2015-06-22 MED ORDER — TECHNETIUM TC 99M SESTAMIBI GENERIC - CARDIOLITE
31.7000 | Freq: Once | INTRAVENOUS | Status: AC | PRN
  Administered 2015-06-22: 31.7 via INTRAVENOUS

## 2015-06-22 MED ORDER — TECHNETIUM TC 99M SESTAMIBI GENERIC - CARDIOLITE
10.6000 | Freq: Once | INTRAVENOUS | Status: AC | PRN
  Administered 2015-06-22: 11 via INTRAVENOUS

## 2015-08-08 ENCOUNTER — Emergency Department
Admission: EM | Admit: 2015-08-08 | Discharge: 2015-08-08 | Disposition: A | Discharge status: Home / Self Care | Payer: Worker's Compensation | Attending: Emergency Medicine | Admitting: Emergency Medicine

## 2015-08-08 ENCOUNTER — Emergency Department: Payer: Worker's Compensation

## 2015-08-08 ENCOUNTER — Encounter: Payer: Self-pay | Admitting: *Deleted

## 2015-08-08 DIAGNOSIS — Z79899 Other long term (current) drug therapy: Secondary | ICD-10-CM | POA: Diagnosis not present

## 2015-08-08 DIAGNOSIS — I10 Essential (primary) hypertension: Secondary | ICD-10-CM | POA: Insufficient documentation

## 2015-08-08 DIAGNOSIS — S301XXA Contusion of abdominal wall, initial encounter: Secondary | ICD-10-CM | POA: Insufficient documentation

## 2015-08-08 DIAGNOSIS — Y9289 Other specified places as the place of occurrence of the external cause: Secondary | ICD-10-CM | POA: Insufficient documentation

## 2015-08-08 DIAGNOSIS — Y998 Other external cause status: Secondary | ICD-10-CM | POA: Insufficient documentation

## 2015-08-08 DIAGNOSIS — Y9389 Activity, other specified: Secondary | ICD-10-CM | POA: Diagnosis not present

## 2015-08-08 DIAGNOSIS — Z87891 Personal history of nicotine dependence: Secondary | ICD-10-CM | POA: Diagnosis not present

## 2015-08-08 DIAGNOSIS — S3991XA Unspecified injury of abdomen, initial encounter: Secondary | ICD-10-CM | POA: Diagnosis present

## 2015-08-08 DIAGNOSIS — W2209XA Striking against other stationary object, initial encounter: Secondary | ICD-10-CM | POA: Insufficient documentation

## 2015-08-08 DIAGNOSIS — Z7982 Long term (current) use of aspirin: Secondary | ICD-10-CM | POA: Insufficient documentation

## 2015-08-08 LAB — CBC WITH DIFFERENTIAL/PLATELET
Basophils Absolute: 0.1 10*3/uL (ref 0–0.1)
Basophils Relative: 0 %
Eosinophils Absolute: 0.1 10*3/uL (ref 0–0.7)
Eosinophils Relative: 1 %
HCT: 38.8 % — ABNORMAL LOW (ref 40.0–52.0)
Hemoglobin: 13.5 g/dL (ref 13.0–18.0)
Lymphocytes Relative: 18 %
Lymphs Abs: 2.5 10*3/uL (ref 1.0–3.6)
MCH: 31.8 pg (ref 26.0–34.0)
MCHC: 34.8 g/dL (ref 32.0–36.0)
MCV: 91.3 fL (ref 80.0–100.0)
Monocytes Absolute: 1.2 10*3/uL — ABNORMAL HIGH (ref 0.2–1.0)
Monocytes Relative: 9 %
Neutro Abs: 10.1 10*3/uL — ABNORMAL HIGH (ref 1.4–6.5)
Neutrophils Relative %: 72 %
Platelets: 307 10*3/uL (ref 150–440)
RBC: 4.25 MIL/uL — ABNORMAL LOW (ref 4.40–5.90)
RDW: 13.1 % (ref 11.5–14.5)
WBC: 14 10*3/uL — ABNORMAL HIGH (ref 3.8–10.6)

## 2015-08-08 LAB — PROTIME-INR
INR: 1.1
Prothrombin Time: 14.4 seconds (ref 11.4–15.0)

## 2015-08-08 LAB — COMPREHENSIVE METABOLIC PANEL
ALT: 33 U/L (ref 17–63)
AST: 30 U/L (ref 15–41)
Albumin: 4.2 g/dL (ref 3.5–5.0)
Alkaline Phosphatase: 58 U/L (ref 38–126)
Anion gap: 9 (ref 5–15)
BUN: 22 mg/dL — ABNORMAL HIGH (ref 6–20)
CO2: 25 mmol/L (ref 22–32)
Calcium: 9 mg/dL (ref 8.9–10.3)
Chloride: 101 mmol/L (ref 101–111)
Creatinine, Ser: 1.06 mg/dL (ref 0.61–1.24)
GFR calc Af Amer: >60 mL/min (ref >60)
GFR calc non Af Amer: >60 mL/min (ref >60)
Glucose, Bld: 111 mg/dL — ABNORMAL HIGH (ref 65–99)
Potassium: 3.7 mmol/L (ref 3.5–5.1)
Sodium: 135 mmol/L (ref 135–145)
Total Bilirubin: 0.7 mg/dL (ref 0.3–1.2)
Total Protein: 7.4 g/dL (ref 6.5–8.1)

## 2015-08-08 MED ORDER — OXYCODONE HCL 5 MG PO TABS: 5.0000 mg | ORAL_TABLET | Freq: Three times a day (TID) | ORAL | Status: DC | PRN

## 2015-08-08 MED ORDER — DOCUSATE SODIUM 100 MG PO CAPS: 100.0000 mg | ORAL_CAPSULE | Freq: Every day | ORAL | Status: DC | PRN

## 2015-08-08 MED ORDER — IOHEXOL 350 MG/ML SOLN
100.0000 mL | Freq: Once | INTRAVENOUS | Status: AC | PRN
  Administered 2015-08-08: 100 mL via INTRAVENOUS

## 2015-08-08 NOTE — ED Notes (Signed)
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

## 2015-08-08 NOTE — ED Notes (Signed)
Patient states that while working on Monday he dropped a piece of "duct", he attempted to lift it with a jack and the jack handle fell and hit him in the abd on the right side.

## 2015-08-08 NOTE — ED Provider Notes (Addendum)
[facility] Emergency Department Provider Note  ____________________________________________   I have reviewed the triage vital signs and the nursing notes.   HISTORY  Chief Complaint Injury    HPI Carlos Hunt is a 69 y.o. male 2 days ago was working and a Camera operator which is in balls apparently a metal handle, hit him very hard in the right side of his abdomen. He states he had significant bruising at that time but the pain has been getting worse and not better in nondisplaced here. He denies any syncope or lightheadedness. He denies other injury. He did not pass out. Not his head. The patient does take a full aspirin every day he has significant contusion to the right side of his abdominal cavity, he does not wish any pain meds at this time. He denies any focal numbness or weakness. He denies any hematemesis or melena or bright red blood per rectum  Past Medical History  Diagnosis Date  . Hypertension   . Hyperlipidemia   . Pain in left knee   . Blood in stool   . Pain, joint, shoulder   . Tremor   . GERD (gastroesophageal reflux disease)   . Erectile dysfunction   . AR (allergic rhinitis)   . Tobacco use disorder   . Abnormal involuntary movement   . Hx of colonic polyps   . Unspecified vitamin D deficiency   . Testicular hypofunction   . Allergy   . Benign essential tremor     s/p neurology consultation by Brigitte Pulse in [place].    Patient Active Problem List   Diagnosis Date Noted  . Pain in the chest 06/05/2015  . Sinus bradycardia 06/05/2015  . Essential tremor 08/07/2014  . Essential hypertension, benign 02/14/2013    Past Surgical History  Procedure Laterality Date  . Colonoscopy  09/09/2011    normal.  Previous polyps.  [name]. Repeat 5 years.  . Left hand surgery  2011    trauma    Current Outpatient Rx  Name  Route  Sig  Dispense  Refill  . aspirin 325 MG tablet   Oral   Take 325 mg by mouth daily.         . Cholecalciferol (HM  VITAMIN D3) 2000 UNITS CAPS   Oral   Take 2,000 Units by mouth.         . Cyanocobalamin (VITAMIN B12 PO)   Oral   Take 3,000 mcg by mouth daily.         . Echinacea 400 MG CAPS   Oral   Take 400 mg by mouth daily.         Marland Kitchen GLUCOSAMINE-CHONDROIT-MSM-C-MN PO   Oral   Take by mouth.         . hydrochlorothiazide (HYDRODIURIL) 25 MG tablet   Oral   Take 1 tablet (25 mg total) by mouth daily.   30 tablet   11   . lisinopril (PRINIVIL,ZESTRIL) 10 MG tablet   Oral   Take 1 tablet (10 mg total) by mouth daily.   30 tablet   11   . Multiple Vitamins-Minerals (CENTRUM PO)   Oral   Take by mouth.         . Omega-3 Fatty Acids (ULTRA OMEGA 3 PO)   Oral   Take 1,000 mg by mouth 2 (two) times daily.         . primidone (MYSOLINE) 50 MG tablet   Oral   Take by mouth 4 (four) times  daily.           Allergies Other  Family History  Problem Relation Age of Onset  . Dementia Mother   . Parkinson's disease Father   . Cancer Father 75    prostate  . Heart disease Father 28    CAD/CABG  . Hypertension Father   . Hyperlipidemia Father   . Diabetes Sister   . Parkinson's disease Brother     Social History Social History  Substance Use Topics  . Smoking status: Former Smoker -- 3.00 packs/day for 10 years    Quit date: 09/24/1971  . Smokeless tobacco: None     Comment: quit in the 1970's  . Alcohol Use: Yes     Comment: occasional; once every 6 months    Review of Systems Constitutional: No fever/chills Eyes: No visual changes. ENT: No sore throat. No stiff neck no neck pain Cardiovascular: Denies chest pain. Respiratory: Denies shortness of breath. Gastrointestinal:   no vomiting.  No diarrhea.  No constipation. Genitourinary: Negative for dysuria. Musculoskeletal: Negative lower extremity swelling Skin: Negative for rash. Neurological: Negative for headaches, focal weakness or numbness. 10-point ROS otherwise  negative.  ____________________________________________   PHYSICAL EXAM:  VITAL SIGNS: ED Triage Vitals  Enc Vitals Group     BP --      Pulse --      Resp --      Temp --      Temp src --      SpO2 --      Weight --      Height --      Head Cir --      Peak Flow --      Pain Score 08/08/15 1400 8     Pain Loc --      Pain Edu? --      Excl. in [place]? --     Constitutional: Alert and oriented. Well appearing and in no acute distress. Eyes: Conjunctivae are normal. PERRL. EOMI. Head: Atraumatic. Nose: No congestion/rhinnorhea. Mouth/Throat: Mucous membranes are moist.  Oropharynx non-erythematous. Neck: No stridor.   Nontender with no meningismus Cardiovascular: Normal rate, regular rhythm. Grossly normal heart sounds.  Good peripheral circulation. Respiratory: Normal respiratory effort.  No retractions. Lungs CTAB. Abdominal:  Significant hematoma involving mostly the right side of the abdomen which seems to stop her on about line, tender to touch in these areas, no discomfort noted to abdominal palpation distal from the actual area of bruising suggesting that there is likely not hemoperitoneum, abdomen is otherwise soft No distention. No guarding no rebound Back:  There is no focal tenderness or step off there is no midline tenderness there are no lesions noted. there is no CVA tenderness Musculoskeletal: No lower extremity tenderness. No joint effusions, no DVT signs strong distal pulses no edema Neurologic:  Normal speech and language. No gross focal neurologic deficits are appreciated.  Skin:  Skin is warm, dry and intact. No rash noted. Psychiatric: Mood and affect are normal. Speech and behavior are normal.  ____________________________________________   LABS (all labs ordered are listed, but only abnormal results are displayed)  Labs Reviewed  COMPREHENSIVE METABOLIC PANEL  CBC WITH DIFFERENTIAL/PLATELET  PROTIME-INR    ____________________________________________  EKG  I personally interpreted any EKGs ordered by me or triage  ____________________________________________  RADIOLOGY  I reviewed any imaging ordered by me or triage that were performed during my shift ____________________________________________   PROCEDURES  Procedure(s) performed: None  Critical Care performed: None  ____________________________________________   INITIAL IMPRESSION / ASSESSMENT AND PLAN / ED COURSE  Pertinent labs & imaging results that were available during my care of the patient were reviewed by me and considered in my medical decision making (see chart for details).  Patient with significant bruising from and I sent 2 days ago, on aspirin, although I have low suspicion of a significant liver or intra-abdominal injury, there certainly was a significant impact to the patient is here for increasing pain which I think at this time mandates imaging. Blood work and CT will be obtained to ensure that there is no evidence of a liver laceration or other intra-abdominal pathology. Patient is declining pain medication vital signs are stable this moment. ____________________________________________   ----------------------------------------- 6:15 PM on 08/08/2015 -----------------------------------------  H with a significant hematoma but no evidence of intra-abdominal pathology today. He is very comfortable and declines pain meds he states he would like something to go home with. He is hemodynamically stable and his blood work is reassuring. Patient does not wish to be admitted to the hospital or have further workup. I will therefore send him home on pain medication. Patient will follow up with his doctor in the next day or 2. He has been made aware of the findings on CT scan and the need for follow-up with them but there is no evidence of intra-abdominal pathology it is acutely present as of this injury. I've advised  him to stop taking aspirin which she only takes as a precaution but no known heart disease and I have advised him to return to the emergency room if he feels worse in any way including increased pain lightheadedness or other symptoms.  FINAL CLINICAL IMPRESSION(S) / ED DIAGNOSES  Final diagnoses:  None     Schuyler Amor, MD 08/08/15 [place], MD 08/08/15 1816

## 2015-08-08 NOTE — Discharge Instructions (Signed)
Blunt Abdominal Trauma Stop taking the aspirin for the next 2 weeks. If you have increased pain, lightheadedness, pain in any other part of your abdomen or you feel worse in any way please return to the emergency room. We do notice a Mabey lesion on the liver which is not related to this injury, but our radiologist advised that you talk to your doctor about having an MRI of this area in the future. Please do so in the next few weeks. Return to the emergency room if you feel worse in any way. Do not drive on oxycodone Blunt abdominal trauma is a type of injury that involves damage to the abdominal wall or to abdominal organs, such as the liver or spleen. The damage can involve bruising, tearing, or a rupture. This type of injury does not involve a puncture of the skin. Blunt abdominal trauma can range from mild to severe. In some cases it can lead to a severe abdominal inflammation (peritonitis), severe bleeding, and a dangerous drop in blood pressure. CAUSES This injury is caused by a hard, direct hit to the abdomen. It can happen after:  A motor vehicle accident.  Being kicked or punched in the abdomen.  Falling from a significant height. RISK FACTORS This injury is more likely to happen in people who:  Play contact sports.  Work in a job in which falls or injuries are more likely, such as in Architect. SYMPTOMS The main symptom of this condition is pain in the abdomen. Other symptoms depend on the type and location of the injury. They can include:  Abdominal pain that spreads to the the back or shoulder.  Bruising.  Swelling.  Pain when pressing on the abdomen.  Blood in the urine.  Weakness.  Confusion.  Loss of consciousness.  Pale, dusky, cool, or sweaty skin.  Vomiting blood.  Bloody stool or bleeding from the rectum.  Trouble breathing. Symptoms of this injury can develop suddenly or slowly.  DIAGNOSIS This injury is diagnosed based on your symptoms and a  physical exam. You may also have tests, including:  Blood tests.  Urine tests.  Imaging tests, such as:  A CT scan and ultrasound of your abdomen.  X-rays of your chest and abdomen.  A test in which a tube is used to flush your abdomen with fluid and check for blood (diagnostic peritoneal lavage). TREATMENT Treatment for this injury depends on its type and severity. Treatment options include:  Observation. If the injury is mild, this may be the only treatment needed.  Support of your blood pressure and breathing.  Getting blood, fluids, or medicine through an IV tube.  Antibiotic medicine.  Insertion of tubes into the stomach or bladder.  A blood transfusion.  A procedure to stop bleeding. This involves putting a long, thin tube (catheter) into one of your blood vessels (angiographic embolization).  Surgery to open up your abdomen and control bleeding or repair damage (laparotomy). This may be done if tests suggest that you have peritonitis or bleeding that cannot be controlled with angiographic embolization. HOME CARE INSTRUCTIONS  Take medicines only as directed by your health care provider.  If you were prescribed an antibiotic medicine, finish all of it even if you start to feel better.  Follow your health care provider's instructions about diet and activity restrictions.  Keep all follow-up visits as directed by your health care provider. This is important. SEEK MEDICAL CARE IF:  You continue to have abdominal pain.  Your symptoms return.  You develop new symptoms.  You have blood in your urine or your bowel movements. SEEK IMMEDIATE MEDICAL CARE IF:  You vomit blood.  You have heavy bleeding from your rectum.  You have very bad abdominal pain.  You have trouble breathing.  You have chest pain.  You have a fever.  You have dizziness.  You pass out.   This information is not intended to replace advice given to you by your health care provider.  Make sure you discuss any questions you have with your health care provider.   Document Released: 10/02/2004 Document Revised: 01/09/2015 Document Reviewed: 08/16/2014 Elsevier Interactive Patient Education 2016 [place] A contusion is a deep bruise. Contusions are the result of a blunt injury to tissues and muscle fibers under the skin. The injury causes bleeding under the skin. The skin overlying the contusion may turn blue, purple, or yellow. Minor injuries will give you a painless contusion, but more severe contusions may stay painful and swollen for a few weeks.  CAUSES  This condition is usually caused by a blow, trauma, or direct force to an area of the body. SYMPTOMS  Symptoms of this condition include:  Swelling of the injured area.  Pain and tenderness in the injured area.  Discoloration. The area may have redness and then turn blue, purple, or yellow. DIAGNOSIS  This condition is diagnosed based on a physical exam and medical history. An X-ray, CT scan, or MRI may be needed to determine if there are any associated injuries, such as broken bones (fractures). TREATMENT  Specific treatment for this condition depends on what area of the body was injured. In general, the best treatment for a contusion is resting, icing, applying pressure to (compression), and elevating the injured area. This is often called the RICE strategy. Over-the-counter anti-inflammatory medicines may also be recommended for pain control.  HOME CARE INSTRUCTIONS   Rest the injured area.  If directed, apply ice to the injured area:  Put ice in a plastic bag.  Place a towel between your skin and the bag.  Leave the ice on for 20 minutes, 2-3 times per day.  If directed, apply light compression to the injured area using an elastic bandage. Make sure the bandage is not wrapped too tightly. Remove and reapply the bandage as directed by your health care provider.  If possible, raise (elevate)  the injured area above the level of your heart while you are sitting or lying down.  Take over-the-counter and prescription medicines only as told by your health care provider. SEEK MEDICAL CARE IF:  Your symptoms do not improve after several days of treatment.  Your symptoms get worse.  You have difficulty moving the injured area. SEEK IMMEDIATE MEDICAL CARE IF:   You have severe pain.  You have numbness in a hand or foot.  Your hand or foot turns pale or cold.   This information is not intended to replace advice given to you by your health care provider. Make sure you discuss any questions you have with your health care provider.   Document Released: 06/04/2005 Document Revised: 05/16/2015 Document Reviewed: 01/10/2015 Elsevier Interactive Patient Education [employer].

## 2015-08-08 NOTE — ED Notes (Signed)
Workers comp completed for patient per work Management consultant [phone]

## 2015-08-10 ENCOUNTER — Encounter: Payer: Self-pay | Admitting: Family Medicine

## 2015-08-10 ENCOUNTER — Ambulatory Visit (INDEPENDENT_AMBULATORY_CARE_PROVIDER_SITE_OTHER): Payer: Worker's Compensation | Admitting: Family Medicine

## 2015-08-10 VITALS — BP 134/66 | HR 51 | Temp 98.5°F | Resp 16 | Ht 68.75 in | Wt 203.4 lb

## 2015-08-10 DIAGNOSIS — S301XXA Contusion of abdominal wall, initial encounter: Secondary | ICD-10-CM | POA: Diagnosis not present

## 2015-08-10 DIAGNOSIS — Y990 Civilian activity done for income or pay: Secondary | ICD-10-CM

## 2015-08-10 LAB — COMPREHENSIVE METABOLIC PANEL
ALT: 28 U/L (ref 9–46)
AST: 24 U/L (ref 10–35)
Albumin: 4.4 g/dL (ref 3.6–5.1)
Alkaline Phosphatase: 69 U/L (ref 40–115)
BUN: 16 mg/dL (ref 7–25)
CO2: 28 mmol/L (ref 20–31)
Calcium: 9.3 mg/dL (ref 8.6–10.3)
Chloride: 101 mmol/L (ref 98–110)
Creat: 0.9 mg/dL (ref 0.70–1.25)
Glucose, Bld: 92 mg/dL (ref 65–99)
Potassium: 4.1 mmol/L (ref 3.5–5.3)
Sodium: 136 mmol/L (ref 135–146)
Total Bilirubin: 0.6 mg/dL (ref 0.2–1.2)
Total Protein: 7 g/dL (ref 6.1–8.1)

## 2015-08-10 LAB — CBC WITH DIFFERENTIAL/PLATELET
Basophils Absolute: 0 10*3/uL (ref 0.0–0.1)
Basophils Relative: 0 % (ref 0–1)
Eosinophils Absolute: 0.3 10*3/uL (ref 0.0–0.7)
Eosinophils Relative: 3 % (ref 0–5)
HCT: 37.9 % — ABNORMAL LOW (ref 39.0–52.0)
Hemoglobin: 12.9 g/dL — ABNORMAL LOW (ref 13.0–17.0)
Lymphocytes Relative: 23 % (ref 12–46)
Lymphs Abs: 2.6 10*3/uL (ref 0.7–4.0)
MCH: 31.7 pg (ref 26.0–34.0)
MCHC: 34 g/dL (ref 30.0–36.0)
MCV: 93.1 fL (ref 78.0–100.0)
MPV: 10.1 fL (ref 8.6–12.4)
Monocytes Absolute: 1 10*3/uL (ref 0.1–1.0)
Monocytes Relative: 9 % (ref 3–12)
Neutro Abs: 7.4 10*3/uL (ref 1.7–7.7)
Neutrophils Relative %: 65 % (ref 43–77)
Platelets: 388 10*3/uL (ref 150–400)
RBC: 4.07 MIL/uL — ABNORMAL LOW (ref 4.22–5.81)
RDW: 13.2 % (ref 11.5–15.5)
WBC: 11.4 10*3/uL — ABNORMAL HIGH (ref 4.0–10.5)

## 2015-08-10 LAB — POC MICROSCOPIC URINALYSIS (UMFC): Mucus: ABSENT

## 2015-08-10 LAB — POCT URINALYSIS DIP (MANUAL ENTRY)
Bilirubin, UA: NEGATIVE
Blood, UA: NEGATIVE
Glucose, UA: NEGATIVE
Ketones, POC UA: NEGATIVE
Leukocytes, UA: NEGATIVE
Nitrite, UA: NEGATIVE
Protein Ur, POC: NEGATIVE
Spec Grav, UA: 1.02
Urobilinogen, UA: 0.2
pH, UA: 6

## 2015-08-10 NOTE — Patient Instructions (Signed)
1. Return immediately for fever, vomiting, worsening abdominal pain, blood in urine, dizziness, bloody stools.

## 2015-08-10 NOTE — Progress Notes (Signed)
Subjective:    Patient ID: Carlos Hunt, male    DOB: May 06, 1946, 69 y.o.   MRN: [id number]  08/10/2015  worker's comp   HPI This 69 y.o. male presents for [employer] injury on 08/06/15.  Large metal pipe hit patient on R side of abdomen.  S/p CT abdomen in ED.   No fever/chills/sweats.  Pain has improved from onset; no worsening pain.  Not taking much oxycodone. Very slight nausea; no vomiting.  No diarrhea; no constipation.  No menela or bloody stools.  No hematuria.  S/p u/a and CBC in ED that were normal.   No difficulties urinating; no dysuria. No radiation into legs.  No n/t/w.  No saddle parethesias.  Etta Quill it easy.  Light duty for two days.  Oxycodone 38m every eight hours; has only taken one thus far.   Review of Systems  Constitutional: Negative for fever, chills, diaphoresis, activity change, appetite change and fatigue.  Respiratory: Negative for cough and shortness of breath.   Cardiovascular: Negative for chest pain, palpitations and leg swelling.  Gastrointestinal: Positive for abdominal pain. Negative for nausea, vomiting, diarrhea, constipation, blood in stool, abdominal distention, anal bleeding and rectal pain.  Endocrine: Negative for cold intolerance, heat intolerance, polydipsia, polyphagia and polyuria.  Genitourinary: Negative for dysuria, urgency, frequency, hematuria, flank pain, decreased urine volume, penile swelling, scrotal swelling, enuresis, difficulty urinating, genital sores, penile pain and testicular pain.  Musculoskeletal: Positive for myalgias and back pain.  Skin: Negative for color change, rash and wound.  Neurological: Negative for dizziness, tremors, seizures, syncope, facial asymmetry, speech difficulty, weakness, light-headedness, numbness and headaches.  Psychiatric/Behavioral: Negative for sleep disturbance and dysphoric mood. The patient is not nervous/anxious.         Objective:    BP 134/66 mmHg  Pulse 51  Temp(Src) 98.5 F  (36.9 C) (Oral)  Resp 16  Ht 5' 8.75" (1.746 m)  Wt 203 lb 6.4 oz (92.262 kg)  BMI 30.26 kg/m2  SpO2 98% Physical Exam  Constitutional: He is oriented to person, place, and time. He appears well-developed and well-nourished. No distress.  HENT:  Head: Normocephalic and atraumatic.  Right Ear: External ear normal.  Left Ear: External ear normal.  Nose: Nose normal.  Mouth/Throat: Oropharynx is clear and moist.  Eyes: Conjunctivae and EOM are normal. Pupils are equal, round, and reactive to light.  Neck: Normal range of motion. Neck supple. Carotid bruit is not present. No thyromegaly present.  Cardiovascular: Normal rate, regular rhythm, normal heart sounds and intact distal pulses.  Exam reveals no gallop and no friction rub.   No murmur heard. Pulmonary/Chest: Effort normal and breath sounds normal. He has no wheezes. He has no rales. He exhibits tenderness and bony tenderness. He exhibits no laceration, no crepitus, no edema, no deformity and no swelling.  +TTP lateral and anterior R ribs distally with diffuse bruising along R lateral and anterior ribs.  Abdominal: Soft. Bowel sounds are normal. He exhibits no distension and no mass. There is tenderness. There is no rebound and no guarding. Hernia confirmed negative in the right inguinal area and confirmed negative in the left inguinal area.    Diffuse ecchymoses R anterior ribs and abdomen as outlined.  Genitourinary: Testes normal.    Circumcised.  Diffuse ecchymoses in genital area as outlined.  Musculoskeletal:       Cervical back: Normal. He exhibits normal range of motion.       Thoracic back: Normal. He exhibits normal range of motion.  Lumbar back: He exhibits decreased range of motion and tenderness. He exhibits no bony tenderness, no swelling, no edema, no deformity, no laceration, no pain and normal pulse.  Lymphadenopathy:    He has no cervical adenopathy.       Right: No inguinal adenopathy present.        Left: No inguinal adenopathy present.  Neurological: He is alert and oriented to person, place, and time. No cranial nerve deficit.  Skin: Skin is warm and dry. No rash noted. He is not diaphoretic.  Psychiatric: He has a normal mood and affect. His behavior is normal.  Nursing note and vitals reviewed.  Results for orders placed or performed in visit on 08/10/15  CBC with Differential/Platelet  Result Value Ref Range   WBC 11.4 (H) 4.0 - 10.5 K/uL   RBC 4.07 (L) 4.22 - 5.81 MIL/uL   Hemoglobin 12.9 (L) 13.0 - 17.0 g/dL   HCT 37.9 (L) 39.0 - 52.0 %   MCV 93.1 78.0 - 100.0 fL   MCH 31.7 26.0 - 34.0 pg   MCHC 34.0 30.0 - 36.0 g/dL   RDW 13.2 11.5 - 15.5 %   Platelets 388 150 - 400 K/uL   MPV 10.1 8.6 - 12.4 fL   Neutrophils Relative % 65 43 - 77 %   Neutro Abs 7.4 1.7 - 7.7 K/uL   Lymphocytes Relative 23 12 - 46 %   Lymphs Abs 2.6 0.7 - 4.0 K/uL   Monocytes Relative 9 3 - 12 %   Monocytes Absolute 1.0 0.1 - 1.0 K/uL   Eosinophils Relative 3 0 - 5 %   Eosinophils Absolute 0.3 0.0 - 0.7 K/uL   Basophils Relative 0 0 - 1 %   Basophils Absolute 0.0 0.0 - 0.1 K/uL   Smear Review Criteria for review not met   Comprehensive metabolic panel  Result Value Ref Range   Sodium 136 135 - 146 mmol/L   Potassium 4.1 3.5 - 5.3 mmol/L   Chloride 101 98 - 110 mmol/L   CO2 28 20 - 31 mmol/L   Glucose, Bld 92 65 - 99 mg/dL   BUN 16 7 - 25 mg/dL   Creat 0.90 0.70 - 1.25 mg/dL   Total Bilirubin 0.6 0.2 - 1.2 mg/dL   Alkaline Phosphatase 69 40 - 115 U/L   AST 24 10 - 35 U/L   ALT 28 9 - 46 U/L   Total Protein 7.0 6.1 - 8.1 g/dL   Albumin 4.4 3.6 - 5.1 g/dL   Calcium 9.3 8.6 - 10.3 mg/dL  POCT urinalysis dipstick  Result Value Ref Range   Color, UA yellow yellow   Clarity, UA clear clear   Glucose, UA negative negative   Bilirubin, UA negative negative   Ketones, POC UA negative negative   Spec Grav, UA 1.020    Blood, UA negative negative   pH, UA 6.0    Protein Ur, POC negative  negative   Urobilinogen, UA 0.2    Nitrite, UA Negative Negative   Leukocytes, UA Negative Negative  POCT Microscopic Urinalysis ([facility])  Result Value Ref Range   WBC,UR,HPF,POC None None WBC/hpf   RBC,UR,HPF,POC None None RBC/hpf   Bacteria None None, Too numerous to count   Mucus Absent Absent   Epithelial Cells, UR Per Microscopy None None, Too numerous to count cells/hpf       Assessment & Plan:   1. Contusion, abdominal wall, initial encounter   2. Work related injury     -  New to this provider. -ED records reviewed in detail; s/p CT abdomen negative for acute process; s/p u/a and CBC in ED.  -repeat CBC and u/a stable during visit today. -Continue light duty for next week.  Avoid lifting over 25 pounds for the next week. -RTC in on 08/17/2015 at 4:15pm.    Orders Placed This Encounter  Procedures  . CBC with Differential/Platelet  . Comprehensive metabolic panel  . POCT urinalysis dipstick  . POCT Microscopic Urinalysis ([facility])   No orders of the defined types were placed in this encounter.    No Follow-up on file.    Ruhan Borak Elayne Guerin, M.D. [facility] [address] [place], [place]  [address] [phone] phone [phone] fax

## 2015-08-17 ENCOUNTER — Ambulatory Visit: Payer: Worker's Compensation

## 2015-08-17 ENCOUNTER — Ambulatory Visit (INDEPENDENT_AMBULATORY_CARE_PROVIDER_SITE_OTHER): Payer: Worker's Compensation | Admitting: Family Medicine

## 2015-08-17 ENCOUNTER — Encounter: Payer: Self-pay | Admitting: Family Medicine

## 2015-08-17 VITALS — BP 133/66 | HR 51 | Temp 98.0°F | Resp 16 | Ht 68.25 in | Wt 200.0 lb

## 2015-08-17 DIAGNOSIS — S20211D Contusion of right front wall of thorax, subsequent encounter: Secondary | ICD-10-CM | POA: Diagnosis not present

## 2015-08-17 DIAGNOSIS — S301XXD Contusion of abdominal wall, subsequent encounter: Secondary | ICD-10-CM

## 2015-08-17 DIAGNOSIS — Y990 Civilian activity done for income or pay: Secondary | ICD-10-CM

## 2015-08-17 DIAGNOSIS — R0789 Other chest pain: Secondary | ICD-10-CM

## 2015-08-17 DIAGNOSIS — S298XXD Other specified injuries of thorax, subsequent encounter: Secondary | ICD-10-CM

## 2015-08-17 DIAGNOSIS — R0781 Pleurodynia: Secondary | ICD-10-CM

## 2015-08-17 LAB — CBC WITH DIFFERENTIAL/PLATELET
Basophils Absolute: 0.1 10*3/uL (ref 0.0–0.1)
Basophils Relative: 1 % (ref 0–1)
Eosinophils Absolute: 0.2 10*3/uL (ref 0.0–0.7)
Eosinophils Relative: 2 % (ref 0–5)
HCT: 40.4 % (ref 39.0–52.0)
Hemoglobin: 13.9 g/dL (ref 13.0–17.0)
Lymphocytes Relative: 23 % (ref 12–46)
Lymphs Abs: 2.6 10*3/uL (ref 0.7–4.0)
MCH: 32 pg (ref 26.0–34.0)
MCHC: 34.4 g/dL (ref 30.0–36.0)
MCV: 93.1 fL (ref 78.0–100.0)
MPV: 9.7 fL (ref 8.6–12.4)
Monocytes Absolute: 0.9 10*3/uL (ref 0.1–1.0)
Monocytes Relative: 8 % (ref 3–12)
Neutro Abs: 7.3 10*3/uL (ref 1.7–7.7)
Neutrophils Relative %: 66 % (ref 43–77)
Platelets: 385 10*3/uL (ref 150–400)
RBC: 4.34 MIL/uL (ref 4.22–5.81)
RDW: 13.3 % (ref 11.5–15.5)
WBC: 11.1 10*3/uL — ABNORMAL HIGH (ref 4.0–10.5)

## 2015-08-17 LAB — POCT URINALYSIS DIP (MANUAL ENTRY)
Bilirubin, UA: NEGATIVE
Blood, UA: NEGATIVE
Glucose, UA: NEGATIVE
Ketones, POC UA: NEGATIVE
Leukocytes, UA: NEGATIVE
Nitrite, UA: NEGATIVE
Protein Ur, POC: NEGATIVE
Spec Grav, UA: 1.02
Urobilinogen, UA: 0.2
pH, UA: 5.5

## 2015-08-17 NOTE — Progress Notes (Signed)
Subjective:    Patient ID: Carlos Hunt, male    DOB: [birth year], 69 y.o.   MRN: [id number]  08/17/2015  worker's comp   HPI This 69 y.o. male presents for five day follow-up of abdominal wall and chest wall contusion from work related injury. Doing well. Pain is decreasing. Bruising is decreasing.  Denies fever/chills/sweats. Denies cough, SOB, wheezing.  Denies abdominal pain, nausea, vomiting, diarrhea, constipation, bloody stools, or melena. Denies hematuria, flank pain, decreased urination.  Tolerating light duty without difficulties.  Did suffer with scrotal swelling and bruising that was tender for a few days; this has improved.   Review of Systems  Constitutional: Negative for fever, chills, diaphoresis, activity change, appetite change and fatigue.  Eyes: Negative for visual disturbance.  Respiratory: Negative for cough and shortness of breath.   Cardiovascular: Negative for chest pain, palpitations and leg swelling.  Gastrointestinal: Negative for nausea, vomiting, abdominal pain, diarrhea, constipation, blood in stool, abdominal distention, anal bleeding and rectal pain.  Endocrine: Negative for cold intolerance, heat intolerance, polydipsia, polyphagia and polyuria.  Genitourinary: Positive for scrotal swelling. Negative for dysuria, urgency, frequency, flank pain, decreased urine volume, penile swelling, difficulty urinating and testicular pain.  Musculoskeletal: Positive for myalgias, back pain and arthralgias. Negative for joint swelling, gait problem, neck pain and neck stiffness.  Neurological: Negative for dizziness, tremors, seizures, syncope, facial asymmetry, speech difficulty, weakness, light-headedness, numbness and headaches.  Hematological: Bruises/bleeds easily.       Objective:    BP 133/66 mmHg  Pulse 51  Temp(Src) 98 F (36.7 C) (Oral)  Resp 16  Ht 5' 8.25" (1.734 m)  Wt 200 lb (90.719 kg)  BMI 30.17 kg/m2 Physical Exam  Constitutional: He is oriented  to person, place, and time. He appears well-developed and well-nourished. No distress.  HENT:  Head: Normocephalic and atraumatic.  Right Ear: External ear normal.  Left Ear: External ear normal.  Nose: Nose normal.  Mouth/Throat: Oropharynx is clear and moist.  Eyes: Conjunctivae and EOM are normal. Pupils are equal, round, and reactive to light.  Neck: Normal range of motion. Neck supple. Carotid bruit is not present. No thyromegaly present.  Cardiovascular: Normal rate, regular rhythm, normal heart sounds and intact distal pulses.  Exam reveals no gallop and no friction rub.   No murmur heard. Pulmonary/Chest: Effort normal and breath sounds normal. No respiratory distress. He has no wheezes. He has no rales. He exhibits tenderness.  R anterior-lateral distal ribs TTP.    Abdominal: Soft. Bowel sounds are normal. He exhibits no distension and no mass. There is no tenderness. There is no rebound and no guarding. Hernia confirmed negative in the right inguinal area and confirmed negative in the left inguinal area.    Genitourinary: Testes normal.    Right testis shows no mass, no swelling and no tenderness. Left testis shows no mass, no swelling and no tenderness.  Decrease in ecchymoses along scrotum.  Musculoskeletal:       Legs: Lymphadenopathy:    He has no cervical adenopathy.       Right: No inguinal adenopathy present.       Left: No inguinal adenopathy present.  Neurological: He is alert and oriented to person, place, and time. No cranial nerve deficit.  Skin: Skin is warm and dry. No rash noted. He is not diaphoretic.  Decrease bruising along R anterior abdomen, R lateral side, R lumbar region.  Psychiatric: He has a normal mood and affect. His behavior is normal.  Nursing  note and vitals reviewed.  Results for orders placed or performed in visit on 08/17/15  CBC with Differential/Platelet  Result Value Ref Range   WBC 11.1 (H) 4.0 - 10.5 K/uL   RBC 4.34 4.22 - 5.81  MIL/uL   Hemoglobin 13.9 13.0 - 17.0 g/dL   HCT 40.4 39.0 - 52.0 %   MCV 93.1 78.0 - 100.0 fL   MCH 32.0 26.0 - 34.0 pg   MCHC 34.4 30.0 - 36.0 g/dL   RDW 13.3 11.5 - 15.5 %   Platelets 385 150 - 400 K/uL   MPV 9.7 8.6 - 12.4 fL   Neutrophils Relative % 66 43 - 77 %   Neutro Abs 7.3 1.7 - 7.7 K/uL   Lymphocytes Relative 23 12 - 46 %   Lymphs Abs 2.6 0.7 - 4.0 K/uL   Monocytes Relative 8 3 - 12 %   Monocytes Absolute 0.9 0.1 - 1.0 K/uL   Eosinophils Relative 2 0 - 5 %   Eosinophils Absolute 0.2 0.0 - 0.7 K/uL   Basophils Relative 1 0 - 1 %   Basophils Absolute 0.1 0.0 - 0.1 K/uL   Smear Review Criteria for review not met   Comprehensive metabolic panel  Result Value Ref Range   Sodium 137 135 - 146 mmol/L   Potassium 4.1 3.5 - 5.3 mmol/L   Chloride 99 98 - 110 mmol/L   CO2 25 20 - 31 mmol/L   Glucose, Bld 82 65 - 99 mg/dL   BUN 22 7 - 25 mg/dL   Creat 1.02 0.70 - 1.25 mg/dL   Total Bilirubin 0.6 0.2 - 1.2 mg/dL   Alkaline Phosphatase 79 40 - 115 U/L   AST 24 10 - 35 U/L   ALT 25 9 - 46 U/L   Total Protein 7.1 6.1 - 8.1 g/dL   Albumin 4.2 3.6 - 5.1 g/dL   Calcium 9.4 8.6 - 10.3 mg/dL  POCT urinalysis dipstick  Result Value Ref Range   Color, UA yellow yellow   Clarity, UA clear clear   Glucose, UA negative negative   Bilirubin, UA negative negative   Ketones, POC UA negative negative   Spec Grav, UA 1.020    Blood, UA negative negative   pH, UA 5.5    Protein Ur, POC negative negative   Urobilinogen, UA 0.2    Nitrite, UA Negative Negative   Leukocytes, UA Negative Negative       Assessment & Plan:   1. Rib pain on right side   2. Abdominal contusion, subsequent encounter   3. Work related injury   4. Rib contusion, right, subsequent encounter    -Improving. -Continue with light duty for next ten days; no lifting/pushing/pulling more than 25 pounds. -RTC on Monday, 08/27/15 3:15pm.   Orders Placed This Encounter  Procedures  . DG Ribs Unilateral  W/Chest Right    Standing Status: Future     Number of Occurrences: 1     Standing Expiration Date: 08/16/2016    Order Specific Question:  Reason for Exam (SYMPTOM  OR DIAGNOSIS REQUIRED)    Answer:  R abdomen and rib contusion; steel pipe at high force hit abdomen    Order Specific Question:  Preferred imaging location?    Answer:  External  . CBC with Differential/Platelet  . Comprehensive metabolic panel  . POCT urinalysis dipstick   No orders of the defined types were placed in this encounter.    Return in about 10 days (around  08/27/2015) for recheck.    Kristi Elayne Guerin, M.D. [facility] [address] [place], [place]  [address] [phone] phone [phone] fax

## 2015-08-18 LAB — COMPREHENSIVE METABOLIC PANEL
ALT: 25 U/L (ref 9–46)
AST: 24 U/L (ref 10–35)
Albumin: 4.2 g/dL (ref 3.6–5.1)
Alkaline Phosphatase: 79 U/L (ref 40–115)
BUN: 22 mg/dL (ref 7–25)
CO2: 25 mmol/L (ref 20–31)
Calcium: 9.4 mg/dL (ref 8.6–10.3)
Chloride: 99 mmol/L (ref 98–110)
Creat: 1.02 mg/dL (ref 0.70–1.25)
Glucose, Bld: 82 mg/dL (ref 65–99)
Potassium: 4.1 mmol/L (ref 3.5–5.3)
Sodium: 137 mmol/L (ref 135–146)
Total Bilirubin: 0.6 mg/dL (ref 0.2–1.2)
Total Protein: 7.1 g/dL (ref 6.1–8.1)

## 2015-08-27 ENCOUNTER — Encounter: Payer: Self-pay | Admitting: Family Medicine

## 2015-08-27 ENCOUNTER — Ambulatory Visit (INDEPENDENT_AMBULATORY_CARE_PROVIDER_SITE_OTHER): Payer: Worker's Compensation | Admitting: Family Medicine

## 2015-08-27 VITALS — BP 136/61 | HR 59 | Temp 97.9°F | Resp 16 | Ht 69.0 in | Wt 202.2 lb

## 2015-08-27 DIAGNOSIS — S301XXD Contusion of abdominal wall, subsequent encounter: Secondary | ICD-10-CM | POA: Diagnosis not present

## 2015-08-27 DIAGNOSIS — S20211D Contusion of right front wall of thorax, subsequent encounter: Secondary | ICD-10-CM | POA: Diagnosis not present

## 2015-08-27 DIAGNOSIS — Y990 Civilian activity done for income or pay: Secondary | ICD-10-CM

## 2015-08-27 NOTE — Progress Notes (Signed)
Subjective:    Patient ID: Carlos Hunt, male    DOB: June 24, 1946, 70 y.o.   MRN: [id number]  08/27/2015  Follow-up   HPI This 69 y.o. male presents for one week follow-up of abdominal wall and R rib contusion. Feeling much better. Denies fever/chills/sweats. Denies cough or SOB.  Pain is slowly improving.  Tolerating light duty without difficulties at this time.  Denies n/v/d/c. Denies bloody stools or melena. Denies hematuria or difficulties urinating.  Not requiring pain medication to control pain.  Bruising is improving.  Three weeks ago.     Review of Systems  Constitutional: Negative for fever, chills, diaphoresis, activity change, appetite change and fatigue.  Respiratory: Negative for cough and shortness of breath.   Cardiovascular: Negative for chest pain, palpitations and leg swelling.  Gastrointestinal: Negative for nausea, vomiting, abdominal pain and diarrhea.  Endocrine: Negative for cold intolerance, heat intolerance, polydipsia, polyphagia and polyuria.  Skin: Negative for color change, rash and wound.  Neurological: Negative for dizziness, tremors, seizures, syncope, facial asymmetry, speech difficulty, weakness, light-headedness, numbness and headaches.  Psychiatric/Behavioral: Negative for sleep disturbance and dysphoric mood. The patient is not nervous/anxious.         Objective:    BP 136/61 mmHg  Pulse 59  Temp(Src) 97.9 F (36.6 C) (Oral)  Resp 16  Ht 5' 9"  (1.753 m)  Wt 202 lb 3.2 oz (91.717 kg)  BMI 29.85 kg/m2 Physical Exam  Constitutional: He is oriented to person, place, and time. He appears well-developed and well-nourished. No distress.  HENT:  Head: Normocephalic and atraumatic.  Right Ear: External ear normal.  Left Ear: External ear normal.  Nose: Nose normal.  Mouth/Throat: Oropharynx is clear and moist.  Eyes: Conjunctivae and EOM are normal. Pupils are equal, round, and reactive to light.  Neck: Normal range of motion. Neck supple.  Carotid bruit is not present. No thyromegaly present.  Cardiovascular: Normal rate, regular rhythm, normal heart sounds and intact distal pulses.  Exam reveals no gallop and no friction rub.   No murmur heard. Pulmonary/Chest: Effort normal and breath sounds normal. He has no wheezes. He has no rales. He exhibits tenderness.  +TTP R lateral ribs distally and anteriorly.  Abdominal: Soft. Bowel sounds are normal. He exhibits no distension and no mass. There is no tenderness. There is no rebound and no guarding. Hernia confirmed negative in the right inguinal area and confirmed negative in the left inguinal area.    Decrease in ecchymoses along R lower abdomen R anterior chest wall.  Genitourinary: Testes normal and penis normal.     Decrease in ecchymoses along R genital region.  Musculoskeletal:       Right shoulder: Normal.       Left shoulder: Normal.       Cervical back: Normal.       Thoracic back: Normal. He exhibits normal range of motion, no tenderness, no bony tenderness, no swelling, no edema, no deformity, no laceration, no pain, no spasm and normal pulse.       Lumbar back: Normal. He exhibits normal range of motion, no tenderness, no bony tenderness, no swelling, no edema, no deformity, no laceration, no pain, no spasm and normal pulse.       Back:  Decrease in ecchymoses along R lower lumbar region.  Lymphadenopathy:    He has no cervical adenopathy.       Right: No inguinal adenopathy present.       Left: No inguinal adenopathy present.  Neurological: He is alert and oriented to person, place, and time. No cranial nerve deficit.  Skin: Skin is warm and dry. No rash noted. He is not diaphoretic.  Psychiatric: He has a normal mood and affect. His behavior is normal.  Nursing note and vitals reviewed.  Results for orders placed or performed in visit on 08/17/15  CBC with Differential/Platelet  Result Value Ref Range   WBC 11.1 (H) 4.0 - 10.5 K/uL   RBC 4.34 4.22 - 5.81  MIL/uL   Hemoglobin 13.9 13.0 - 17.0 g/dL   HCT 40.4 39.0 - 52.0 %   MCV 93.1 78.0 - 100.0 fL   MCH 32.0 26.0 - 34.0 pg   MCHC 34.4 30.0 - 36.0 g/dL   RDW 13.3 11.5 - 15.5 %   Platelets 385 150 - 400 K/uL   MPV 9.7 8.6 - 12.4 fL   Neutrophils Relative % 66 43 - 77 %   Neutro Abs 7.3 1.7 - 7.7 K/uL   Lymphocytes Relative 23 12 - 46 %   Lymphs Abs 2.6 0.7 - 4.0 K/uL   Monocytes Relative 8 3 - 12 %   Monocytes Absolute 0.9 0.1 - 1.0 K/uL   Eosinophils Relative 2 0 - 5 %   Eosinophils Absolute 0.2 0.0 - 0.7 K/uL   Basophils Relative 1 0 - 1 %   Basophils Absolute 0.1 0.0 - 0.1 K/uL   Smear Review Criteria for review not met   Comprehensive metabolic panel  Result Value Ref Range   Sodium 137 135 - 146 mmol/L   Potassium 4.1 3.5 - 5.3 mmol/L   Chloride 99 98 - 110 mmol/L   CO2 25 20 - 31 mmol/L   Glucose, Bld 82 65 - 99 mg/dL   BUN 22 7 - 25 mg/dL   Creat 1.02 0.70 - 1.25 mg/dL   Total Bilirubin 0.6 0.2 - 1.2 mg/dL   Alkaline Phosphatase 79 40 - 115 U/L   AST 24 10 - 35 U/L   ALT 25 9 - 46 U/L   Total Protein 7.1 6.1 - 8.1 g/dL   Albumin 4.2 3.6 - 5.1 g/dL   Calcium 9.4 8.6 - 10.3 mg/dL  POCT urinalysis dipstick  Result Value Ref Range   Color, UA yellow yellow   Clarity, UA clear clear   Glucose, UA negative negative   Bilirubin, UA negative negative   Ketones, POC UA negative negative   Spec Grav, UA 1.020    Blood, UA negative negative   pH, UA 5.5    Protein Ur, POC negative negative   Urobilinogen, UA 0.2    Nitrite, UA Negative Negative   Leukocytes, UA Negative Negative       Assessment & Plan:   1. Rib contusion, right, subsequent encounter   2. Abdominal wall contusion, subsequent encounter   3. Work related injury    -Improving. -Return to regular duty. -Return PRN only.   No orders of the defined types were placed in this encounter.   No orders of the defined types were placed in this encounter.    No Follow-up on file.    Kristi Elayne Guerin, M.D. [facility] [address] [place], [place]  [address] [phone] phone [phone] fax

## 2015-09-07 ENCOUNTER — Other Ambulatory Visit: Payer: Self-pay | Admitting: Family Medicine

## 2015-09-07 ENCOUNTER — Other Ambulatory Visit: Payer: Self-pay

## 2015-09-07 DIAGNOSIS — K769 Liver disease, unspecified: Secondary | ICD-10-CM

## 2015-10-29 ENCOUNTER — Other Ambulatory Visit: Payer: Self-pay | Admitting: Family Medicine

## 2015-10-29 DIAGNOSIS — K769 Liver disease, unspecified: Secondary | ICD-10-CM

## 2015-10-29 NOTE — Addendum Note (Signed)
Addended by: Orion Crook on: 10/29/2015 11:26 AM   Modules accepted: Orders

## 2015-10-29 NOTE — Addendum Note (Signed)
Addended by: Orion Crook on: 10/29/2015 12:01 PM   Modules accepted: Orders

## 2015-10-30 ENCOUNTER — Other Ambulatory Visit
Admission: RE | Admit: 2015-10-30 | Discharge: 2015-10-30 | Disposition: A | Discharge status: Home / Self Care | Payer: Managed Care, Other (non HMO) | Source: Ambulatory Visit | Attending: Family Medicine | Admitting: Family Medicine

## 2015-10-30 ENCOUNTER — Ambulatory Visit
Admission: RE | Admit: 2015-10-30 | Discharge: 2015-10-30 | Disposition: A | Discharge status: Home / Self Care | Payer: Managed Care, Other (non HMO) | Source: Ambulatory Visit | Attending: Family Medicine | Admitting: Family Medicine

## 2015-10-30 DIAGNOSIS — K760 Fatty (change of) liver, not elsewhere classified: Secondary | ICD-10-CM | POA: Diagnosis not present

## 2015-10-30 DIAGNOSIS — D1803 Hemangioma of intra-abdominal structures: Secondary | ICD-10-CM | POA: Diagnosis not present

## 2015-10-30 DIAGNOSIS — N281 Cyst of kidney, acquired: Secondary | ICD-10-CM | POA: Diagnosis not present

## 2015-10-30 DIAGNOSIS — K7689 Other specified diseases of liver: Secondary | ICD-10-CM | POA: Diagnosis not present

## 2015-10-30 DIAGNOSIS — K769 Liver disease, unspecified: Secondary | ICD-10-CM

## 2015-10-30 LAB — BUN: BUN: 24 mg/dL — ABNORMAL HIGH (ref 6–20)

## 2015-10-30 LAB — CREATININE, SERUM
Creatinine, Ser: 1.13 mg/dL (ref 0.61–1.24)
GFR calc Af Amer: >60 mL/min (ref >60)
GFR calc non Af Amer: >60 mL/min (ref >60)

## 2015-10-30 MED ORDER — GADOBENATE DIMEGLUMINE 529 MG/ML IV SOLN
20.0000 mL | Freq: Once | INTRAVENOUS | Status: AC | PRN
  Administered 2015-10-30: 19 mL via INTRAVENOUS

## 2015-11-16 ENCOUNTER — Encounter: Payer: Managed Care, Other (non HMO) | Admitting: Family Medicine

## 2015-11-21 ENCOUNTER — Other Ambulatory Visit: Payer: Self-pay | Admitting: Family Medicine

## 2015-11-27 ENCOUNTER — Encounter: Payer: Self-pay | Admitting: Family Medicine

## 2015-11-27 ENCOUNTER — Ambulatory Visit (INDEPENDENT_AMBULATORY_CARE_PROVIDER_SITE_OTHER): Payer: Managed Care, Other (non HMO) | Admitting: Family Medicine

## 2015-11-27 VITALS — BP 132/66 | HR 53 | Temp 97.9°F | Resp 16 | Ht 69.0 in | Wt 202.0 lb

## 2015-11-27 DIAGNOSIS — Z23 Encounter for immunization: Secondary | ICD-10-CM

## 2015-11-27 DIAGNOSIS — R7302 Impaired glucose tolerance (oral): Secondary | ICD-10-CM

## 2015-11-27 DIAGNOSIS — Z0000 Encounter for general adult medical examination without abnormal findings: Secondary | ICD-10-CM | POA: Diagnosis not present

## 2015-11-27 DIAGNOSIS — I10 Essential (primary) hypertension: Secondary | ICD-10-CM | POA: Diagnosis not present

## 2015-11-27 DIAGNOSIS — Z8042 Family history of malignant neoplasm of prostate: Secondary | ICD-10-CM | POA: Diagnosis not present

## 2015-11-27 DIAGNOSIS — Q6100 Congenital renal cyst, unspecified: Secondary | ICD-10-CM

## 2015-11-27 DIAGNOSIS — D175 Benign lipomatous neoplasm of intra-abdominal organs: Secondary | ICD-10-CM

## 2015-11-27 DIAGNOSIS — N281 Cyst of kidney, acquired: Secondary | ICD-10-CM

## 2015-11-27 DIAGNOSIS — R001 Bradycardia, unspecified: Secondary | ICD-10-CM

## 2015-11-27 DIAGNOSIS — G250 Essential tremor: Secondary | ICD-10-CM

## 2015-11-27 DIAGNOSIS — D1771 Benign lipomatous neoplasm of kidney: Secondary | ICD-10-CM

## 2015-11-27 LAB — COMPREHENSIVE METABOLIC PANEL
ALT: 37 U/L (ref 9–46)
AST: 34 U/L (ref 10–35)
Albumin: 4.5 g/dL (ref 3.6–5.1)
Alkaline Phosphatase: 71 U/L (ref 40–115)
BUN: 19 mg/dL (ref 7–25)
CO2: 29 mmol/L (ref 20–31)
Calcium: 9.7 mg/dL (ref 8.6–10.3)
Chloride: 98 mmol/L (ref 98–110)
Creat: 1.04 mg/dL (ref 0.70–1.25)
Glucose, Bld: 87 mg/dL (ref 65–99)
Potassium: 4.2 mmol/L (ref 3.5–5.3)
Sodium: 137 mmol/L (ref 135–146)
Total Bilirubin: 0.5 mg/dL (ref 0.2–1.2)
Total Protein: 7.2 g/dL (ref 6.1–8.1)

## 2015-11-27 LAB — LIPID PANEL
Cholesterol: 160 mg/dL (ref 125–200)
HDL: 44 mg/dL (ref >=40)
LDL Cholesterol: 77 mg/dL (ref <130)
Total CHOL/HDL Ratio: 3.6 Ratio (ref <=5.0)
Triglycerides: 194 mg/dL — ABNORMAL HIGH (ref <150)
VLDL: 39 mg/dL — ABNORMAL HIGH (ref <30)

## 2015-11-27 LAB — CBC WITH DIFFERENTIAL/PLATELET
Basophils Absolute: 0.1 10*3/uL (ref 0.0–0.1)
Basophils Relative: 1 % (ref 0–1)
Eosinophils Absolute: 0.2 10*3/uL (ref 0.0–0.7)
Eosinophils Relative: 2 % (ref 0–5)
HCT: 45.9 % (ref 39.0–52.0)
Hemoglobin: 15.9 g/dL (ref 13.0–17.0)
Lymphocytes Relative: 26 % (ref 12–46)
Lymphs Abs: 2 10*3/uL (ref 0.7–4.0)
MCH: 31.5 pg (ref 26.0–34.0)
MCHC: 34.6 g/dL (ref 30.0–36.0)
MCV: 91.1 fL (ref 78.0–100.0)
MPV: 9.8 fL (ref 8.6–12.4)
Monocytes Absolute: 0.7 10*3/uL (ref 0.1–1.0)
Monocytes Relative: 9 % (ref 3–12)
Neutro Abs: 4.8 10*3/uL (ref 1.7–7.7)
Neutrophils Relative %: 62 % (ref 43–77)
Platelets: 316 10*3/uL (ref 150–400)
RBC: 5.04 MIL/uL (ref 4.22–5.81)
RDW: 13.3 % (ref 11.5–15.5)
WBC: 7.8 10*3/uL (ref 4.0–10.5)

## 2015-11-27 LAB — POCT URINALYSIS DIP (MANUAL ENTRY)
Bilirubin, UA: NEGATIVE
Blood, UA: NEGATIVE
Glucose, UA: NEGATIVE
Ketones, POC UA: NEGATIVE
Leukocytes, UA: NEGATIVE
Nitrite, UA: NEGATIVE
Protein Ur, POC: NEGATIVE
Spec Grav, UA: 1.025
Urobilinogen, UA: 0.2
pH, UA: 6

## 2015-11-27 LAB — TSH: TSH: 2.49 mIU/L (ref 0.40–4.50)

## 2015-11-27 MED ORDER — HYDROCHLOROTHIAZIDE 25 MG PO TABS: 25.0000 mg | ORAL_TABLET | Freq: Every day | ORAL | Status: DC

## 2015-11-27 MED ORDER — LISINOPRIL 10 MG PO TABS: 10.0000 mg | ORAL_TABLET | Freq: Every day | ORAL | Status: DC

## 2015-11-27 NOTE — Progress Notes (Signed)
Subjective:    Patient ID: Carlos Hunt, male    DOB: 1946-07-13, 70 y.o.   MRN: [id number]  11/27/2015  Annual Exam and Medication Refill   HPI This 70 y.o. male presents for Complete Physical Examination.  Last physical: 11-12-2014 Colonoscopy:  2013 TDAP:  2014 Pneumovax:  Prevnar 13 2016 Zostavax:   Never; no history of chicken pox Influenza:  05-2015 Eye exam: last month; no glaucoma; +shading in one eye; no macular degeneration. Dental exam:  Cleaning 10/2015.  Tremor: increased to 153m bid.  Too much medication; got really anxious; mild headaches, constipation, increased BP to 177/88.  Decreased to 538mbid; increased to 7557mid but pt refused.  Started exercising and tremor has decreased. Pt is convinced that tremor is a pinched nerve.  R groin pain and swelling: was protruding but has gone back in.  Still having some pain; onset in past few months.  Review of Systems  Constitutional: Negative for fever, chills, diaphoresis, activity change, appetite change, fatigue and unexpected weight change.  HENT: Negative for congestion, dental problem, drooling, ear discharge, ear pain, facial swelling, hearing loss, mouth sores, nosebleeds, postnasal drip, rhinorrhea, sinus pressure, sneezing, sore throat, tinnitus, trouble swallowing and voice change.   Eyes: Negative for photophobia, pain, discharge, redness, itching and visual disturbance.  Respiratory: Negative for apnea, cough, choking, chest tightness, shortness of breath, wheezing and stridor.   Cardiovascular: Negative for chest pain, palpitations and leg swelling.  Gastrointestinal: Negative for nausea, vomiting, abdominal pain, diarrhea, constipation and blood in stool.  Endocrine: Negative for cold intolerance, heat intolerance, polydipsia, polyphagia and polyuria.  Genitourinary: Negative for dysuria, urgency, frequency, hematuria, flank pain, decreased urine volume, discharge, penile swelling, scrotal swelling, enuresis,  difficulty urinating, genital sores, penile pain and testicular pain.  Musculoskeletal: Negative for myalgias, back pain, joint swelling, arthralgias, gait problem, neck pain and neck stiffness.  Skin: Negative for color change, pallor, rash and wound.  Allergic/Immunologic: Negative for environmental allergies, food allergies and immunocompromised state.  Neurological: Negative for dizziness, tremors, seizures, syncope, facial asymmetry, speech difficulty, weakness, light-headedness, numbness and headaches.  Hematological: Negative for adenopathy. Does not bruise/bleed easily.  Psychiatric/Behavioral: Negative for suicidal ideas, hallucinations, behavioral problems, confusion, sleep disturbance, self-injury, dysphoric mood, decreased concentration and agitation. The patient is not nervous/anxious and is not hyperactive.     Past Medical History  Diagnosis Date  . Hypertension   . Hyperlipidemia   . Pain in left knee   . Blood in stool   . Pain, joint, shoulder   . Tremor   . GERD (gastroesophageal reflux disease)   . Erectile dysfunction   . AR (allergic rhinitis)   . Tobacco use disorder   . Abnormal involuntary movement   . Hx of colonic polyps   . Unspecified vitamin D deficiency   . Testicular hypofunction   . Allergy   . Benign essential tremor     s/p neurology consultation by ShaBrigitte Pulse [place] Past Surgical History  Procedure Laterality Date  . Colonoscopy  09/09/2011    normal.  Previous polyps.  [name]. Repeat 5 years.  . Left hand surgery  2011    trauma   Allergies  Allergen Reactions  . Other Nausea And Vomiting    NOVACAINE   Current Outpatient Prescriptions  Medication Sig Dispense Refill  . aspirin 325 MG tablet Take 325 mg by mouth daily.    . Cholecalciferol (HM VITAMIN D3) 2000 UNITS CAPS Take 2,000 Units by mouth.    .Marland Kitchen  Cyanocobalamin (VITAMIN B12 PO) Take 3,000 mcg by mouth daily. Reported on 08/27/2015    . Echinacea 400 MG CAPS Take 400 mg by  mouth daily.    Marland Kitchen GLUCOSAMINE-CHONDROIT-MSM-C-MN PO Take by mouth.    . hydrochlorothiazide (HYDRODIURIL) 25 MG tablet Take 1 tablet (25 mg total) by mouth daily. 90 tablet 1  . lisinopril (PRINIVIL,ZESTRIL) 10 MG tablet Take 1 tablet (10 mg total) by mouth daily. 90 tablet 1  . Multiple Vitamins-Minerals (CENTRUM PO) Take by mouth.    . Omega-3 Fatty Acids (ULTRA OMEGA 3 PO) Take 1,000 mg by mouth 2 (two) times daily.    . primidone (MYSOLINE) 50 MG tablet Take by mouth 4 (four) times daily. Reported on 08/27/2015     No current facility-administered medications for this visit.   Social History   Social History  . Marital Status: Married    Spouse Name: N/A  . Number of Children: 4  . Years of Education: N/A   Occupational History  . building maintenance     [employer]   Social History Main Topics  . Smoking status: Former Smoker -- 3.00 packs/day for 10 years    Quit date: 09/24/1971  . Smokeless tobacco: Not on file     Comment: quit in the 1970's  . Alcohol Use: Yes     Comment: occasional; once every 6 months  . Drug Use: No  . Sexual Activity: Yes   Other Topics Concern  . Not on file   Social History Narrative   Marital status: married x 45 years.      Children: 2 daughters, 2 sons; 6 grandchildren.      Lives: with wife.        Employment:  [employer] x 13 years; moderately happy.  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      Tobacco: quit 42 years ago; smoked x 10 years.       Alcohol: socially; weekends.  Rarely.      Drugs: none      Exercise:  None; work is physical.        Seatbelt: 100%      Sunscreen: rare sun exposure      Guns:  4 gun; unloaded, not stored in locked cabinet.       Smoke alarm and carbon monoxide detector in the home.      Caffeine use: consumes a minimal amount   Living Will: patient does NOT have Living Will; Does NOT have HCPOA; getting ready to work on it   Organ Donor: NO         Family History  Problem Relation  Age of Onset  . Dementia Mother   . Parkinson's disease Father   . Cancer Father 55    prostate  . Heart disease Father 76    CAD/CABG  . Hypertension Father   . Hyperlipidemia Father   . Parkinsonism Father   . Parkinson's disease Brother   . Diabetes Sister        Objective:    BP 132/66 mmHg  Pulse 53  Temp(Src) 97.9 F (36.6 C) (Oral)  Resp 16  Ht 5' 9"  (1.753 m)  Wt 202 lb (91.627 kg)  BMI 29.82 kg/m2 Physical Exam  Constitutional: He is oriented to person, place, and time. He appears well-developed and well-nourished. No distress.  HENT:  Head: Normocephalic and atraumatic.  Right Ear: External ear normal.  Left Ear: External ear normal.  Nose: Nose normal.  Mouth/Throat: Oropharynx is  clear and moist.  Eyes: Conjunctivae and EOM are normal. Pupils are equal, round, and reactive to light.  Neck: Normal range of motion. Neck supple. Carotid bruit is not present. No thyromegaly present.  Cardiovascular: Normal rate, regular rhythm, normal heart sounds and intact distal pulses.  Exam reveals no gallop and no friction rub.   No murmur heard. Pulmonary/Chest: Effort normal and breath sounds normal. He has no wheezes. He has no rales.  Abdominal: Soft. Bowel sounds are normal. He exhibits no distension and no mass. There is no tenderness. There is no rebound and no guarding. A hernia is present. Hernia confirmed positive in the right inguinal area.  Genitourinary: Penis normal.  Bourquin hernia R inguinal.  Musculoskeletal:       Right shoulder: Normal.       Left shoulder: Normal.       Cervical back: Normal.  Lymphadenopathy:    He has no cervical adenopathy.  Neurological: He is alert and oriented to person, place, and time. He has normal reflexes. No cranial nerve deficit. He exhibits normal muscle tone. Coordination normal.  Skin: Skin is warm and dry. No rash noted. He is not diaphoretic.  Psychiatric: He has a normal mood and affect. His behavior is normal.  Judgment and thought content normal.        Assessment & Plan:   1. Routine physical examination   2. Essential hypertension, benign   3. Sinus bradycardia   4. Essential tremor   5. Glucose intolerance (impaired glucose tolerance)   6. Renal cyst   7. Renal angiolipoma   8. Family history of prostate cancer in father     Orders Placed This Encounter  Procedures  . Pneumococcal polysaccharide vaccine 23-valent greater than or equal to 2yo subcutaneous/IM  . CBC with Differential/Platelet  . Comprehensive metabolic panel    Order Specific Question:  Has the patient fasted?    Answer:  Yes  . Lipid panel    Order Specific Question:  Has the patient fasted?    Answer:  Yes  . TSH  . Hemoglobin A1c  . PSA  . POCT urinalysis dipstick  . EKG 12-Lead   Meds ordered this encounter  Medications  . lisinopril (PRINIVIL,ZESTRIL) 10 MG tablet    Sig: Take 1 tablet (10 mg total) by mouth daily.    Dispense:  90 tablet    Refill:  1  . hydrochlorothiazide (HYDRODIURIL) 25 MG tablet    Sig: Take 1 tablet (25 mg total) by mouth daily.    Dispense:  90 tablet    Refill:  1    Return in about 6 months (around 05/29/2016) for recheck high blood pressure.    Kristi Elayne Guerin, M.D. [facility] [address] [place], [place]  [address] [phone] phone [phone] fax

## 2015-11-27 NOTE — Patient Instructions (Addendum)
Keeping you healthy  Get these tests  Blood pressure- Have your blood pressure checked once a year by your healthcare provider.  Normal blood pressure is 120/80  Weight- Have your body mass index (BMI) calculated to screen for obesity.  BMI is a measure of body fat based on height and weight. You can also calculate your own BMI at www.nhlbisuport.com/bmi/.  Cholesterol- Have your cholesterol checked every year.  Diabetes- Have your blood sugar checked regularly if you have high blood pressure, high cholesterol, have a family history of diabetes or if you are overweight.  Screening for Colon Cancer- Colonoscopy starting at age 50.  Screening may begin sooner depending on your family history and other health conditions. Follow up colonoscopy as directed by your Gastroenterologist.  Screening for Prostate Cancer- Both blood work (PSA) and a rectal exam help screen for Prostate Cancer.  Screening begins at age 40 with African-American men and at age 50 with Caucasian men.  Screening may begin sooner depending on your family history.  Take these medicines  Aspirin- One aspirin daily can help prevent Heart disease and Stroke.  Flu shot- Every fall.  Tetanus- Every 10 years.  Zostavax- Once after the age of 60 to prevent Shingles.  Pneumonia shot- Once after the age of 65; if you are younger than 65, ask your healthcare provider if you need a Pneumonia shot.  Take these steps  Don't smoke- If you do smoke, talk to your doctor about quitting.  For tips on how to quit, go to www.smokefree.gov or call 1-800-QUIT-NOW.  Be physically active- Exercise 5 days a week for at least 30 minutes.  If you are not already physically active start slow and gradually work up to 30 minutes of moderate physical activity.  Examples of moderate activity include walking briskly, mowing the yard, dancing, swimming, bicycling, etc.  Eat a healthy diet- Eat a variety of healthy food such as fruits, vegetables, low  fat milk, low fat cheese, yogurt, lean meant, poultry, fish, beans, tofu, etc. For more information go to www.thenutritionsource.org  Drink alcohol in moderation- Limit alcohol intake to less than two drinks a day. Never drink and drive.  Dentist- Brush and floss twice daily; visit your dentist twice a year.  Depression- Your emotional health is as important as your physical health. If you're feeling down, or losing interest in things you would normally enjoy please talk to your healthcare provider.  Eye exam- Visit your eye doctor every year.  Safe sex- If you may be exposed to a sexually transmitted infection, use a condom.  Seat belts- Seat belts can save your life; always wear one.  Smoke/Carbon Monoxide detectors- These detectors need to be installed on the appropriate level of your home.  Replace batteries at least once a year.  Skin cancer- When out in the sun, cover up and use sunscreen 15 SPF or higher.  Violence- If anyone is threatening you, please tell your healthcare provider.  Living Will/ Health care power of attorney- Speak with your healthcare provider and family.    IF you received an x-ray today, you will receive an invoice from [facility]. Please contact  Radiology at [phone] with questions or concerns regarding your invoice.   IF you received labwork today, you will receive an invoice from [facility]. Please contact [facility] at [phone] with questions or concerns regarding your invoice.   Our billing staff will not be able to assist you with questions regarding bills from these companies.    You will be contacted with the lab results as soon as they are available. The fastest way to get your results is to activate your My Chart account. Instructions are located on the last page of this paperwork. If you have not heard from us regarding the results in 2 weeks, please contact this office.      

## 2015-11-28 LAB — HEMOGLOBIN A1C
Hgb A1c MFr Bld: 5.7 % — ABNORMAL HIGH (ref <5.7)
Mean Plasma Glucose: 117 mg/dL — ABNORMAL HIGH (ref <117)

## 2015-11-28 LAB — PSA: PSA: 0.75 ng/mL (ref <=4.00)

## 2015-12-17 ENCOUNTER — Ambulatory Visit (INDEPENDENT_AMBULATORY_CARE_PROVIDER_SITE_OTHER): Payer: Managed Care, Other (non HMO)

## 2015-12-17 ENCOUNTER — Ambulatory Visit (INDEPENDENT_AMBULATORY_CARE_PROVIDER_SITE_OTHER): Payer: Managed Care, Other (non HMO) | Admitting: Family Medicine

## 2015-12-17 VITALS — BP 144/68 | HR 75 | Temp 97.8°F | Resp 18 | Ht 69.0 in | Wt 203.0 lb

## 2015-12-17 DIAGNOSIS — R1084 Generalized abdominal pain: Secondary | ICD-10-CM | POA: Diagnosis not present

## 2015-12-17 DIAGNOSIS — R109 Unspecified abdominal pain: Secondary | ICD-10-CM

## 2015-12-17 DIAGNOSIS — K5900 Constipation, unspecified: Secondary | ICD-10-CM

## 2015-12-17 DIAGNOSIS — R197 Diarrhea, unspecified: Secondary | ICD-10-CM | POA: Diagnosis not present

## 2015-12-17 LAB — POCT URINALYSIS DIP (MANUAL ENTRY)
Bilirubin, UA: NEGATIVE
Blood, UA: NEGATIVE
Glucose, UA: NEGATIVE
Ketones, POC UA: NEGATIVE
Leukocytes, UA: NEGATIVE
Nitrite, UA: NEGATIVE
Protein Ur, POC: NEGATIVE
Spec Grav, UA: 1.02
Urobilinogen, UA: 0.2
pH, UA: 5.5

## 2015-12-17 LAB — POCT CBC
Granulocyte percent: 67.9 %G (ref 37–80)
HCT, POC: 41.5 % — AB (ref 43.5–53.7)
Hemoglobin: 15.3 g/dL (ref 14.1–18.1)
Lymph, poc: 3.2 (ref 0.6–3.4)
MCH, POC: 32.7 pg — AB (ref 27–31.2)
MCHC: 36.9 g/dL — AB (ref 31.8–35.4)
MCV: 88.7 fL (ref 80–97)
MID (cbc): 0.3 (ref 0–0.9)
MPV: 7.2 fL (ref 0–99.8)
POC Granulocyte: 7.3 — AB (ref 2–6.9)
POC LYMPH PERCENT: 29.2 %L (ref 10–50)
POC MID %: 2.9 %M (ref 0–12)
Platelet Count, POC: 328 10*3/uL (ref 142–424)
RBC: 4.68 M/uL — AB (ref 4.69–6.13)
RDW, POC: 12.9 %
WBC: 10.8 10*3/uL — AB (ref 4.6–10.2)

## 2015-12-17 LAB — POC MICROSCOPIC URINALYSIS (UMFC): Mucus: ABSENT

## 2015-12-17 MED ORDER — TRAMADOL HCL 50 MG PO TABS: 50.0000 mg | ORAL_TABLET | Freq: Four times a day (QID) | ORAL | Status: DC | PRN

## 2015-12-17 NOTE — Progress Notes (Signed)
Patient ID: Carlos Hunt, male    DOB: 01/23/1946  Age: 70 y.o. MRN: [id number]  Chief Complaint  Patient presents with  . Abdominal Cramping    1wk, pt states cramping not getting better, but other symptoms have gone away  . Headache    x1k, light headed  . Chills    x1wk  . Diarrhea    x1wk  . Constipation    Subjective:   Patient is here for a physical exam several weeks ago and everything was good. About a week ago he developed abdominal pain. He had a lot of diarrhea, like being prepped for colonoscopy. That lasted for a day or 2. After that he did not have a BM for 3 days. However he has continued to have cramping abdominal pains. When he stares around it cramps up. He has not had nausea or vomiting. Actually had some nausea but no vomiting. His bowels have been acting the last few days, with a normal bowel movement today. He has not been running a fever though he has had chills. His wife had a little GI symptoms. He wondered whether it could be from food poisoning but had no etiology. Has not had any surgeries on his abdomen. He is generally healthy. Not on any new medications.  Current allergies, medications, problem list, past/family and social histories reviewed.  Objective:  BP 144/68 mmHg  Pulse 75  Temp(Src) 97.8 F (36.6 C) (Oral)  Resp 18  Ht 5' 9"  (1.753 m)  Wt 203 lb (92.08 kg)  BMI 29.96 kg/m2  SpO2 97%  No major acute distress at this time. He is not hurting right this second. Chest clear. Heart regular without murmurs. Throat clear. Abdomen has normal bowel sounds, soft without organomegaly or masses. Some mild nonspecific left lower and right upper quadrant tenderness. No rebound. Normal on percussion.  Assessment & Plan:   Assessment: No diagnosis found.    Plan: Check labs and x-ray.  No orders of the defined types were placed in this encounter.    No orders of the defined types were placed in this encounter.   Results for orders placed or  performed in visit on 12/17/15  POCT CBC  Result Value Ref Range   WBC 10.8 (A) 4.6 - 10.2 K/uL   Lymph, poc 3.2 0.6 - 3.4   POC LYMPH PERCENT 29.2 10 - 50 %L   MID (cbc) 0.3 0 - 0.9   POC MID % 2.9 0 - 12 %M   POC Granulocyte 7.3 (A) 2 - 6.9   Granulocyte percent 67.9 37 - 80 %G   RBC 4.68 (A) 4.69 - 6.13 M/uL   Hemoglobin 15.3 14.1 - 18.1 g/dL   HCT, POC 41.5 (A) 43.5 - 53.7 %   MCV 88.7 80 - 97 fL   MCH, POC 32.7 (A) 27 - 31.2 pg   MCHC 36.9 (A) 31.8 - 35.4 g/dL   RDW, POC 12.9 %   Platelet Count, POC 328 142 - 424 K/uL   MPV 7.2 0 - 99.8 fL  POCT urinalysis dipstick  Result Value Ref Range   Color, UA yellow yellow   Clarity, UA clear clear   Glucose, UA negative negative   Bilirubin, UA negative negative   Ketones, POC UA negative negative   Spec Grav, UA 1.020    Blood, UA negative negative   pH, UA 5.5    Protein Ur, POC negative negative   Urobilinogen, UA 0.2    Nitrite, UA  Negative Negative   Leukocytes, UA Negative Negative  POCT Microscopic Urinalysis ([facility])  Result Value Ref Range   WBC,UR,HPF,POC Few (A) None WBC/hpf   RBC,UR,HPF,POC None None RBC/hpf   Bacteria None None, Too numerous to count   Mucus Absent Absent   Epithelial Cells, UR Per Microscopy None None, Too numerous to count cells/hpf         Patient Instructions       IF you received an x-ray today, you will receive an invoice from [facility]. Please contact [facility] at [phone] with questions or concerns regarding your invoice.   IF you received labwork today, you will receive an invoice from [facility]. Please contact [facility] at [phone] with questions or concerns regarding your invoice.   Our billing staff will not be able to assist you with questions regarding bills from these companies.  You will be contacted with the lab results as soon as they are available. The fastest way to get your results is to activate your My  Chart account. Instructions are located on the last page of this paperwork. If you have not heard from [place] regarding the results in 2 weeks, please contact this office.         No Follow-up on file.   HOPPER,DAVID, MD 12/17/2015

## 2015-12-17 NOTE — Patient Instructions (Addendum)
Take some MiraLAX daily until the stools are on the loose side again, trying to avoid bad diarrhea.  In the event of more acute abdominal pain return or go to the emergency room at any time  I will let you know the results of the remaining labs in the next few days.  IF you received an x-ray today, you will receive an invoice from [facility]. Please contact [facility] at [phone] with questions or concerns regarding your invoice.   IF you received labwork today, you will receive an invoice from [facility]. Please contact [facility] at [phone] with questions or concerns regarding your invoice.   Our billing staff will not be able to assist you with questions regarding bills from these companies.  You will be contacted with the lab results as soon as they are available. The fastest way to get your results is to activate your My Chart account. Instructions are located on the last page of this paperwork. If you have not heard from [place] regarding the results in 2 weeks, please contact this office.

## 2015-12-18 ENCOUNTER — Encounter: Payer: Self-pay | Admitting: Family Medicine

## 2015-12-18 LAB — COMPLETE METABOLIC PANEL WITH GFR
ALT: 92 U/L — ABNORMAL HIGH (ref 9–46)
AST: 24 U/L (ref 10–35)
Albumin: 4.3 g/dL (ref 3.6–5.1)
Alkaline Phosphatase: 84 U/L (ref 40–115)
BUN: 17 mg/dL (ref 7–25)
CO2: 30 mmol/L (ref 20–31)
Calcium: 9.7 mg/dL (ref 8.6–10.3)
Chloride: 100 mmol/L (ref 98–110)
Creat: 0.92 mg/dL (ref 0.70–1.25)
GFR, Est African American: >89 mL/min (ref >=60)
GFR, Est Non African American: 85 mL/min (ref >=60)
Glucose, Bld: 82 mg/dL (ref 65–99)
Potassium: 4.1 mmol/L (ref 3.5–5.3)
Sodium: 140 mmol/L (ref 135–146)
Total Bilirubin: 0.3 mg/dL (ref 0.2–1.2)
Total Protein: 7.1 g/dL (ref 6.1–8.1)

## 2016-01-21 ENCOUNTER — Telehealth: Payer: Self-pay

## 2016-01-21 DIAGNOSIS — G250 Essential tremor: Secondary | ICD-10-CM

## 2016-01-21 NOTE — Telephone Encounter (Signed)
Advised pt referral has been placed.

## 2016-01-21 NOTE — Telephone Encounter (Signed)
Dr Tamala Julian, Pt is requesting a referral to Dr. Posey Pronto with [facility]. He has been seeing Dr. Brigitte Pulse in [place] and he's just looking to get a second opinion on his tremors please.

## 2016-01-21 NOTE — Telephone Encounter (Signed)
Dr. Tamala Julian,  Pt called inquiring if you would be willing to refer him to see Dr. Posey Pronto at [facility] for a second opinion. Pt has been seeing Dr. Brigitte Pulse but would like someone to take a look at his tremor. Patient last seen on 11/27/15 by you for his CPE. Does patient need to come back in to see you or are you ok with referral. Please advise   Thanks   [facility]

## 2016-01-21 NOTE — Telephone Encounter (Signed)
Please call --- order placed for referral to Dr. Posey Pronto of [facility].

## 2016-02-27 ENCOUNTER — Ambulatory Visit (INDEPENDENT_AMBULATORY_CARE_PROVIDER_SITE_OTHER): Payer: Managed Care, Other (non HMO) | Admitting: Neurology

## 2016-02-27 ENCOUNTER — Encounter: Payer: Self-pay | Admitting: Neurology

## 2016-02-27 ENCOUNTER — Telehealth: Payer: Self-pay | Admitting: Neurology

## 2016-02-27 VITALS — BP 150/60 | HR 52 | Ht 71.0 in | Wt 205.0 lb

## 2016-02-27 DIAGNOSIS — G250 Essential tremor: Secondary | ICD-10-CM | POA: Diagnosis not present

## 2016-02-27 DIAGNOSIS — Z820 Family history of epilepsy and other diseases of the nervous system: Secondary | ICD-10-CM | POA: Diagnosis not present

## 2016-02-27 NOTE — Patient Instructions (Signed)
1. We are referring you to [facility] for a DaT scan. They will contact you directly to set up a time for this testing. If you do not hear from them they can be contacted at [phone]. 2. Please let us know if you are interested in medication before next visit.  3. Follow up after Dat Scan.

## 2016-02-27 NOTE — Addendum Note (Signed)
Addended byAnnamaria Helling on: 02/27/2016 10:35 AM   Modules accepted: Orders

## 2016-02-27 NOTE — Telephone Encounter (Signed)
Dat scan referral faxed to [facility] at [phone] with confirmation received. They will contact patient with appt. No precert required per [employer].

## 2016-02-27 NOTE — Progress Notes (Signed)
Carlos Hunt was seen today in the movement disorders clinic for neurologic consultation at the request of SMITH,KRISTI, MD.  I have reviewed numerous prior records made available to me.  This patient is accompanied in the office by his spouse who supplements the history.  The patient presents today for a second opinion regarding essential tremor.  He has been seeing [name] in [place] since 03/02/2014 and his last appointment with him was on November 05, 2015.  The patient reports that tremor began slowly, in approximately 2007.  Tremor started in both hands. Pt is R hand dominant but both hands shake. The patient notices tremor most when the hand is in use.  There is a family history of tremor in the patient's father, daughter, and brother, but his brother and father do have a known diagnosis of Parkinson's disease.  On his first visit with [name], the patient was started on primidone, 50 mg twice a day for essential tremor.  At his follow-up 2 months later, the patient reported that tremor was better, although he felt that the medication was causing significant dreams.  He continued on the medication until his February, 2017 follow-up and his primidone was increased to 125 mg twice a day.  Jaw tremor was noted that visit.  The patient called the neurology office a few weeks later because of anxiety and just "felt weird" and primidone was reduced to 75 mg twice a day.   Tremor: Yes.     Affected by caffeine:  No. (drinks very little to no caffeine)  Affected by alcohol:  Yes.   (no change in tremor with wine, but notes improvement with liquor)  Affected by stress:  Yes.    Affected by fatigue:  No. (no per pt, yes per wife)  Spills soup if on spoon:  Yes.   (uses lots of crackers to try to soak it up)  Spills glass of liquid if full:  May or may not.    Affects ADL's (tying shoes, brushing teeth, etc):  No. but did have to change to electric razor because of cuts  Family hx of similar:  Yes.     Specific Symptoms:  Voice: "softened a little but not much Sleep: sleeping well  Vivid Dreams:  Yes.   (but attributes to primidone)  Acting out dreams:  No. Wet Pillows: No. Postural symptoms:  No.  Falls?  No. (none for 2 years and slipped on ice then) Bradykinesia symptoms: slower to get up and attributes to knee issues and age Loss of smell:  No. Loss of taste:  No. Urinary Incontinence:  No. Difficulty Swallowing:  No. Handwriting, micrographia: No. Depression:  No. Memory changes:  No. Hallucinations:  No.  visual distortions: No. N/V:  No. Lightheaded:  No.  Syncope: No. Diplopia:  No. Dyskinesia:  No.  Neuroimaging has previously been performed.  It is available for my review today.  MRI was done 10/02/14 and was unremarkable.    ALLERGIES:   Allergies  Allergen Reactions  . Other Nausea And Vomiting    NOVACAINE    CURRENT MEDICATIONS:  Outpatient Encounter Prescriptions as of 02/27/2016  Medication Sig  . aspirin 325 MG tablet Take 325 mg by mouth daily.  . Cholecalciferol (HM VITAMIN D3) 2000 UNITS CAPS Take 2,000 Units by mouth.  . Cyanocobalamin (VITAMIN B12 PO) Take 3,000 mcg by mouth daily. Reported on 08/27/2015  . Echinacea 400 MG CAPS Take 400 mg by mouth daily.  Marland Kitchen GLUCOSAMINE-CHONDROIT-MSM-C-MN  PO Take by mouth.  . hydrochlorothiazide (HYDRODIURIL) 25 MG tablet Take 1 tablet (25 mg total) by mouth daily.  Marland Kitchen lisinopril (PRINIVIL,ZESTRIL) 10 MG tablet Take 1 tablet (10 mg total) by mouth daily.  . Multiple Vitamins-Minerals (CENTRUM PO) Take by mouth.  . Omega-3 Fatty Acids (ULTRA OMEGA 3 PO) Take 690 mg by mouth 2 (two) times daily.   . primidone (MYSOLINE) 50 MG tablet Take 75 mg by mouth 2 (two) times daily. Reported on 08/27/2015  . traMADol (ULTRAM) 50 MG tablet Take 1 tablet (50 mg total) by mouth every 6 (six) hours as needed for moderate pain.   No facility-administered encounter medications on file as of 02/27/2016.    PAST MEDICAL  HISTORY:   Past Medical History  Diagnosis Date  . Hypertension   . Pain in left knee   . Blood in stool   . Pain, joint, shoulder   . Tremor   . GERD (gastroesophageal reflux disease)   . Erectile dysfunction   . Tobacco use disorder   . Hx of colonic polyps   . Unspecified vitamin D deficiency   . Testicular hypofunction   . Seasonal allergies     PAST SURGICAL HISTORY:   Past Surgical History  Procedure Laterality Date  . Colonoscopy  09/09/2011    normal.  Previous polyps.  [name]. Repeat 5 years.  . Hand surgery Left 2011    trauma    SOCIAL HISTORY:   Social History   Social History  . Marital Status: Married    Spouse Name: N/A  . Number of Children: 4  . Years of Education: N/A   Occupational History  . building maintenance     [employer]   Social History Main Topics  . Smoking status: Former Smoker -- 3.00 packs/day for 10 years    Quit date: 09/23/1970  . Smokeless tobacco: Not on file     Comment: quit in the 1970's  . Alcohol Use: 0.0 oz/week    0 Standard drinks or equivalent per week     Comment: occasional; once every 6 months  . Drug Use: No  . Sexual Activity: Yes   Other Topics Concern  . Not on file   Social History Narrative   Marital status: married x 45 years.      Children: 2 daughters, 2 sons; 6 grandchildren.      Lives: with wife.        Employment:  [employer] x 13 years; moderately happy.  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      Tobacco: quit 42 years ago; smoked x 10 years.       Alcohol: socially; weekends.  Rarely.      Drugs: none      Exercise:  None; work is physical.        Seatbelt: 100%      Sunscreen: rare sun exposure      Guns:  4 gun; unloaded, not stored in locked cabinet.       Smoke alarm and carbon monoxide detector in the home.      Caffeine use: consumes a minimal amount   Living Will: patient does NOT have Living Will; Does NOT have HCPOA; getting ready to work on it   Organ Donor: NO           FAMILY HISTORY:   Family Status  Relation Status Death Age  . Mother Deceased 13    dementia  . Father Deceased 73  PD, heart disease  . Sister Alive     unknown  . Brother Deceased 61    Parkinson's disease  . Sister Alive     unknown  . Brother Alive     unknown  . Brother Alive     unknown  . Daughter Alive     tremor  . Son Alive     healthy    ROS:  A complete 10 system review of systems was obtained and was unremarkable apart from what is mentioned above.  PHYSICAL EXAMINATION:    VITALS:   Filed Vitals:   02/27/16 0832  BP: 150/60  Pulse: 52  Height: 5' 11"  (1.803 m)  Weight: 205 lb (92.987 kg)    GEN:  The patient appears stated age and is in NAD. HEENT:  Normocephalic, atraumatic.  The mucous membranes are moist. The superficial temporal arteries are without ropiness or tenderness. CV:  RRR Lungs:  CTAB Neck/HEME:  There are no carotid bruits bilaterally.  Neurological examination:  Orientation: The patient is alert and oriented x3. Fund of knowledge is appropriate.  Recent and remote memory are intact.  Attention and concentration are normal.    Able to name objects and repeat phrases. Cranial nerves: There is good facial symmetry. Pupils are equal round and reactive to light bilaterally. Fundoscopic exam reveals clear margins bilaterally. Extraocular muscles are intact. The visual fields are full to confrontational testing. The speech is fluent and clear. Soft palate rises symmetrically and there is no tongue deviation. Hearing is intact to conversational tone. Sensation: Sensation is intact to light and pinprick throughout (facial, trunk, extremities). Vibration is intact at the bilateral big toe. There is no extinction with double simultaneous stimulation. There is no sensory dermatomal level identified. Motor: Strength is 5/5 in the bilateral upper and lower extremities.   Shoulder shrug is equal and symmetric.  There is no pronator  drift. Deep tendon reflexes: Deep tendon reflexes are 2/4 at the bilateral biceps, triceps, brachioradialis, patella and achilles. Plantar responses are downgoing bilaterally.  Movement examination: Tone: There is normal tone in the bilateral upper extremities (may be slight hint of rigidity in the RUE).  The tone in the lower extremities is normal.  Abnormal movements: There is a LUE resting tremor noted only with distraction.  There is a L>RLE resting tremor with distraction.  There is a chin tremor.  There is postural tremor, worse in the wing beating position, R more than L.  He has significant trouble with archimedes spirals, R more than L.  Spills water all over when pouring from one glass to another.   Coordination:  There is  decremation with RAM's, only with toe taps on the L.  All other forms of RAM's are normal, with any form of RAMS, including alternating supination and pronation of the forearm, hand opening and closing, finger taps, heel taps bilaterally.  Gait and Station: The patient has no difficulty arising out of a deep-seated chair without the use of the hands. The patient's stride length is normal with normal stride length.  The patient has a negative pull test.      ASSESSMENT/PLAN:  1.  Tremor  -He likely has long standing ET that is now manifesting as a resting tremor.  However, because he also has decremation with foot taps on the L, I would recommend a DaT scan, particularly if surgery is going to be an option.  He asked me multiple questions and ultimately decided to schedule that.    -  Told him that I doubt this will get under control without surgery, given the degree of tremor.  Happy to try to increase primidone or change the medication, but we can really only expect mild degree of suppression with this.   We discussed that this can continue to gradually get worse with time.  We discussed that some medications can worsen this, as can caffeine use.  Pt and family asked me  multiple questions about surgery and answered to the best of my ability.  Much greater than 50% of this visit was spent in counseling and coordinating care.  Total face to face time:  65 min 2.  F/U with [name] as previously scheduled.

## 2016-03-17 ENCOUNTER — Telehealth: Payer: Self-pay

## 2016-03-17 DIAGNOSIS — Z1211 Encounter for screening for malignant neoplasm of colon: Secondary | ICD-10-CM

## 2016-03-17 NOTE — Telephone Encounter (Signed)
Patient wants to know if he can have a referral to his doctor for a colonoscopy. It's [facility] in [place], [place]. They sent him a letter stating that it's time for him to have it done.

## 2016-03-17 NOTE — Telephone Encounter (Signed)
Referral placed to GI for colonoscopy.  Please advise patient.

## 2016-03-18 NOTE — Telephone Encounter (Signed)
Called pt and advised message from provider on their voicemail.

## 2016-03-27 ENCOUNTER — Telehealth: Payer: Self-pay | Admitting: Neurology

## 2016-03-27 NOTE — Telephone Encounter (Signed)
Left message on machine for patient to call back.

## 2016-03-27 NOTE — Telephone Encounter (Signed)
See that [facility] has called pt multiple times to schedule DaT but he hasn't returned call.  Will you find out if he just doesn't want to schedule?  Send letter if won't return your call.  Thx!

## 2016-03-28 NOTE — Telephone Encounter (Signed)
Letter sent.

## 2016-03-31 ENCOUNTER — Telehealth: Payer: Self-pay | Admitting: Neurology

## 2016-04-01 NOTE — Telephone Encounter (Signed)
Note faxed to [facility] department with new number to contact patient. [phone].

## 2016-04-17 ENCOUNTER — Telehealth: Payer: Self-pay | Admitting: Neurology

## 2016-04-17 NOTE — Telephone Encounter (Signed)
Got DaT scan back and looks good.  Did say mild asymmetry of update in caudates, less on the left than the right of uncertain etiology and could be from prominent perivascular space or from remote lacute but said not typical for PD.  Please tell patient looked okay and to make f/u with me if he already doesn't have one to discuss next steps

## 2016-04-18 NOTE — Telephone Encounter (Signed)
Left message on machine for patient's wife to call back. [phone]

## 2016-04-22 ENCOUNTER — Telehealth: Payer: Self-pay | Admitting: Neurology

## 2016-04-22 NOTE — Telephone Encounter (Signed)
Left message with wife for patient to call back.

## 2016-04-22 NOTE — Telephone Encounter (Signed)
PT called and wanted to know if his test results were available yet/Dawn  CB# [phone]

## 2016-04-23 ENCOUNTER — Telehealth: Payer: Self-pay | Admitting: Neurology

## 2016-04-23 NOTE — Telephone Encounter (Signed)
PT returned your call regarding MRI results/Dawn CB#[phone]

## 2016-04-23 NOTE — Telephone Encounter (Signed)
Patient made aware of results. Appt made.

## 2016-04-24 NOTE — Progress Notes (Signed)
Carlos Hunt was seen today in the movement disorders clinic for neurologic consultation at the request of SMITH,KRISTI, MD.  I have reviewed numerous prior records made available to me.  This patient is accompanied in the office by his spouse who supplements the history.  The patient presents today for a second opinion regarding essential tremor.  He has been seeing [name] in [place] since 03/02/2014 and his last appointment with him was on November 05, 2015.  The patient reports that tremor began slowly, in approximately 2007.  Tremor started in both hands. Pt is R hand dominant but both hands shake. The patient notices tremor most when the hand is in use.  There is a family history of tremor in the patient's father, daughter, and brother, but his brother and father do have a known diagnosis of Parkinson's disease.  On his first visit with [name], the patient was started on primidone, 50 mg twice a day for essential tremor.  At his follow-up 2 months later, the patient reported that tremor was better, although he felt that the medication was causing significant dreams.  He continued on the medication until his February, 2017 follow-up and his primidone was increased to 125 mg twice a day.  Jaw tremor was noted that visit.  The patient called the neurology office a few weeks later because of anxiety and just "felt weird" and primidone was reduced to 75 mg twice a day.  04/24/16 update:  The patient was up today, accompanied by his wife who supplements the history.  Records were reviewed and are made available to me.  The patient had a dat scan at [facility] since our last visit.  There was mild asymmetry of update in caudates, less on the left than the right of uncertain etiology and could be from prominent perivascular space or from remote lacute but said not typical for PD or any parkinsonian state.  He had an MRI of the brain in 2016 that was unremarkable.  Neuroimaging has previously been  performed.  It is available for my review today.  MRI was done 10/02/14 and was unremarkable.    ALLERGIES:   Allergies  Allergen Reactions  . Other Nausea And Vomiting    NOVACAINE    CURRENT MEDICATIONS:  Outpatient Encounter Prescriptions as of 04/25/2016  Medication Sig  . aspirin 325 MG tablet Take 325 mg by mouth daily.  . Cholecalciferol (HM VITAMIN D3) 2000 UNITS CAPS Take 2,000 Units by mouth.  . Cyanocobalamin (VITAMIN B12 PO) Take 3,000 mcg by mouth daily. Reported on 08/27/2015  . Echinacea 400 MG CAPS Take 400 mg by mouth daily.  Marland Kitchen GLUCOSAMINE-CHONDROIT-MSM-C-MN PO Take by mouth.  . hydrochlorothiazide (HYDRODIURIL) 25 MG tablet Take 1 tablet (25 mg total) by mouth daily.  Marland Kitchen lisinopril (PRINIVIL,ZESTRIL) 10 MG tablet Take 1 tablet (10 mg total) by mouth daily.  . Multiple Vitamins-Minerals (CENTRUM PO) Take by mouth.  . Omega-3 Fatty Acids (ULTRA OMEGA 3 PO) Take 690 mg by mouth 2 (two) times daily.   . primidone (MYSOLINE) 50 MG tablet Take 75 mg by mouth 2 (two) times daily. Reported on 08/27/2015  . traMADol (ULTRAM) 50 MG tablet Take 1 tablet (50 mg total) by mouth every 6 (six) hours as needed for moderate pain.   No facility-administered encounter medications on file as of 04/25/2016.     PAST MEDICAL HISTORY:   Past Medical History:  Diagnosis Date  . Blood in stool   . Erectile dysfunction   .  GERD (gastroesophageal reflux disease)   . Hx of colonic polyps   . Hypertension   . Pain in left knee   . Pain, joint, shoulder   . Seasonal allergies   . Testicular hypofunction   . Tobacco use disorder   . Tremor   . Unspecified vitamin D deficiency     PAST SURGICAL HISTORY:   Past Surgical History:  Procedure Laterality Date  . COLONOSCOPY  09/09/2011   normal.  Previous polyps.  [name]. Repeat 5 years.  Marland Kitchen HAND SURGERY Left 2011   trauma    SOCIAL HISTORY:   Social History   Social History  . Marital status: Married    Spouse name: N/A  . Number of  children: 4  . Years of education: N/A   Occupational History  . building maintenance     [employer]   Social History Main Topics  . Smoking status: Former Smoker    Packs/day: 3.00    Years: 10.00    Quit date: 09/23/1970  . Smokeless tobacco: Not on file     Comment: quit in the 1970's  . Alcohol use 0.0 oz/week     Comment: occasional; once every 6 months  . Drug use: No  . Sexual activity: Yes   Other Topics Concern  . Not on file   Social History Narrative   Marital status: married x 45 years.      Children: 2 daughters, 2 sons; 6 grandchildren.      Lives: with wife.        Employment:  [employer] x 13 years; moderately happy.  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      Tobacco: quit 42 years ago; smoked x 10 years.       Alcohol: socially; weekends.  Rarely.      Drugs: none      Exercise:  None; work is physical.        Seatbelt: 100%      Sunscreen: rare sun exposure      Guns:  4 gun; unloaded, not stored in locked cabinet.       Smoke alarm and carbon monoxide detector in the home.      Caffeine use: consumes a minimal amount   Living Will: patient does NOT have Living Will; Does NOT have HCPOA; getting ready to work on it   Organ Donor: NO          FAMILY HISTORY:   Family Status  Relation Status  . Mother Deceased at age 90   dementia  . Father Deceased at age 16   PD, heart disease  . Sister Alive   unknown  . Brother Deceased at age 51   Parkinson's disease  . Sister Alive   unknown  . Brother Alive   unknown  . Brother Alive   unknown  . Daughter Alive   tremor  . Son Alive   healthy    ROS:  A complete 10 system review of systems was obtained and was unremarkable apart from what is mentioned above.  PHYSICAL EXAMINATION:    VITALS:   Vitals:   04/25/16 1250  BP: 116/62  Pulse: (!) 53  Weight: 208 lb (94.3 kg)  Height: 5' 11"  (1.803 m)    GEN:  The patient appears stated age and is in NAD. HEENT:   Normocephalic, atraumatic.  The mucous membranes are moist. The superficial temporal arteries are without ropiness or tenderness. CV:  RRR Lungs:  CTAB Neck/HEME:  There are no carotid bruits bilaterally.  Neurological examination:  Orientation: The patient is alert and oriented x3. Fund of knowledge is appropriate.  Recent and remote memory are intact.  Attention and concentration are normal.    Able to name objects and repeat phrases. Cranial nerves: There is good facial symmetry. Pupils are equal round and reactive to light bilaterally. Fundoscopic exam reveals clear margins bilaterally. Extraocular muscles are intact. The visual fields are full to confrontational testing. The speech is fluent and clear. Soft palate rises symmetrically and there is no tongue deviation. Hearing is intact to conversational tone. Sensation: Sensation is intact to light and pinprick throughout (facial, trunk, extremities). Vibration is intact at the bilateral big toe. There is no extinction with double simultaneous stimulation. There is no sensory dermatomal level identified. Motor: Strength is 5/5 in the bilateral upper and lower extremities.   Shoulder shrug is equal and symmetric.  There is no pronator drift.   Movement examination: Tone: There is normal tone in the bilateral upper extremities (may be slight hint of rigidity in the RUE).  The tone in the lower extremities is normal.  Abnormal movements: There is a LUE resting tremor noted only with distraction.  There is a L>RLE resting tremor with distraction.  There is a chin tremor.  There is postural tremor, worse in the wing beating position, R more than L.  He has significant trouble with archimedes spirals, R more than L.  Spills water all over when pouring from one glass to another.   Coordination:  There is  decremation with RAM's, only with toe taps on the L.  All other forms of RAM's are normal, with any form of RAMS, including alternating supination and  pronation of the forearm, hand opening and closing, finger taps, heel taps bilaterally.  Gait and Station: The patient has no difficulty arising out of a deep-seated chair without the use of the hands. The patient's stride length is normal with normal stride length.  The patient has a negative pull test.      ASSESSMENT/PLAN:  1.  Tremor  -He likely has long standing ET that is now manifesting as a resting tremor.   -The patient had a dat scan at [facility] since our last visit.  There was mild asymmetry of update in caudates, less on the left than the right of uncertain etiology and could be from prominent perivascular space or from remote lacute but said not typical for PD or any parkinsonian state.  He had an MRI of the brain in 2016 that was unremarkable.  -He has found primidone to be ineffective for his tremor and he is unable to tolerate higher dosages.  He really is not a candidate for beta blocker therapy.  His pulse is only 53.  His age really is somewhat of a contraindication to starting him on a purely anticholinergic medication for tremor such as Artane or Cogentin.  Second line medication such as gabapentin or topiramate are likely not be helpful for his degree of tremor.  -I talked to the patient about the logistics associated with DBS therapy.  I talked to the patient about risks/benefits/side effects of DBS therapy.  We talked about risks which included but were not limited to infection, paralysis, intraoperative seizure, death, stroke, bleeding around the electrode.   I talked to patient about fiducial placement 1 week prior to DBS therapy.  I talked to the patient about what to expect in the operating room, including the fact that this  is an awake surgery.  We talked about battery placement as well as which is done under general anesthesia, generally approximately one week following the initial surgery.  We also talked about the fact that the patient will need to be off of medications for  surgery.  The patient and family were given the opportunity to ask questions, which they did, and I answered them to the best of my ability today.  He is very interested in pursuing DBS therapy and we will start with neuropsych testing.  I told him if that is successful he will need pre-op video, MRI, consult with [name].  He is agreeable 2.  F/U with [name] as previously scheduled.  Much greater than 50% of this visit was spent in counseling and coordinating care.  Total face to face time:  45 min

## 2016-04-25 ENCOUNTER — Encounter: Payer: Self-pay | Admitting: Neurology

## 2016-04-25 ENCOUNTER — Ambulatory Visit (INDEPENDENT_AMBULATORY_CARE_PROVIDER_SITE_OTHER): Payer: Managed Care, Other (non HMO) | Admitting: Neurology

## 2016-04-25 VITALS — BP 116/62 | HR 53 | Ht 71.0 in | Wt 208.0 lb

## 2016-04-25 DIAGNOSIS — G250 Essential tremor: Secondary | ICD-10-CM | POA: Diagnosis not present

## 2016-05-01 ENCOUNTER — Ambulatory Visit (INDEPENDENT_AMBULATORY_CARE_PROVIDER_SITE_OTHER): Payer: Managed Care, Other (non HMO) | Admitting: Psychology

## 2016-05-01 ENCOUNTER — Encounter: Payer: Self-pay | Admitting: Psychology

## 2016-05-01 DIAGNOSIS — F419 Anxiety disorder, unspecified: Secondary | ICD-10-CM

## 2016-05-01 DIAGNOSIS — G250 Essential tremor: Secondary | ICD-10-CM | POA: Diagnosis not present

## 2016-05-01 NOTE — Progress Notes (Signed)
NEUROPSYCHOLOGICAL INTERVIEW (CPT: [id number])  Name: Dennison Bulla Date of Birth: [birth year] Date of Interview: 05/01/2016  Reason for Referral:  Carlos Hunt is a 70 y.o., married male who is referred for neuropsychological evaluation by Dr. Wells Guiles Tat to assess his current level of cognitive functioning and assist in differential diagnosis. This was also part of a pre-surgical evaluation for deep brain stimulation (DBS) to assist in the management of essential tremor.  This patient is unaccompanied in the office at today's appointment.  History of Presenting Problem:  Mr. Swoveland has a history of essential tremor with first symptoms of bilateral hand tremor in or around 2007. He was prescribed primidone by a previous neurologist but had side effects (i.e., anxiety, paranoid ideation) with this medication and is only able to tolerate a low dose. He does have a reported family history of Parkinson's disease in his oldest brother (now deceased) and his father (in his 35s, now deceased). The patient's daughter has tremors similar to the patient's. Family history is further reportedly significant for dementia in his late mother who first showed signs in her 34s and lived to age 37.  The patient had a dat scan at [facility] which reportedly showed mild asymmetry of uptake in the caudates, less on the left than the right, of uncertain etiology and could be from prominent perivascular space or from remote lacune, but not typical for PD or any parkinsonian state. He also had an MRI of the brain in 2016 that was unremarkable.   The patient reported a history of concussion in February 2016 when slipped on ice. He did lose consciousness for a brief (but unknown) period of time. He denied mental status change, headache, nausea or vomiting afterwards. He had an MRI of the brain done, which as noted previously, was normal.   At today's appointment, the patient denied having any concerns about his cognitive functioning.  He endorsed very mild changes in memory ability over time, which he feels is normal aging. He feels his short-term and long-term memory are both still quite strong.   Upon direct questioning, the patient reported the following:   Forgetting recent conversations/events: No  Repeating statements/questions: On occasion (not often) Misplacing/losing items: No Forgetting appointments or other obligations: No Forgetting to take medications: No  Difficulty concentrating: No Starting but not finishing tasks: No Distracted easily: No  Word-finding difficulty: Always have had that, because my vocabulary is not great.  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 His tremor has significantly impacted his ability to perform job responsibilities. He stated that recently, due to his tremor, it took him 45 minutes to put a control unit in a machine, which is a five-minute job.   Mr. Dufresne continues to independently manage all instrumental ADLs including driving, medications, finances/bill-paying, appointments, and cooking. His tremor also interferes with his ability to efficiently complete some of these tasks; for example, it is nearly impossible for him to write out a check. He denies any difficulty performing IADLs due to cognitive problems, though.  Physically, the patient has no complaints other than his tremors. He denied significant pain, and noted he has a very high pain tolerance. He has not had any falls in the past year. He denied  any difficulty with walking or balance.   With regard to mood, the patient reported that he is always "on the up-swing". He reports euthymic mood with no significant depression or  anxiety at the present time. He denies any psychosocial stress at the present time. He has no difficulty sleeping. He feels well rested after six hours of sleep a night. He feels he has a good energy level. His appetite is very good. He denies suicidal ideation or intention. He has not had any hallucinations or other psychosis.   Psychiatric History: The patient denied any prior history of depression. As noted previously, he did experience anxiety and possible paranoid ideation ("always feeling like someone was around") on higher doses of primidone, but this resolved when the dosage was decreased. Otherwise, he has never had any significant anxiety. He has never been treated for a mental health condition. He denied history of substance dependence. He has never had suicidal ideation or intention.   Social History: Mr. Sagar was born and raised in [place]. He completed high school and an associate's degree. 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 He has been in his current position with [employer] for 14 years. Mr. Khim has been married to his wife for 45 years. They have four children and six grandchildren, and he gets to see them frequently. Mr. Lax drinks alcohol only occasionally. He reported he was a heavy drinker when he was in [employer] but not after that. He is also a former smoker, having quit 45 years ago. He denied illicit substance abuse.   Medical History: Past Medical History:  Diagnosis Date  . Blood in stool   . Erectile dysfunction   . GERD (gastroesophageal reflux disease)   . Hx of colonic polyps   . Hypertension   . Pain in left knee   . Pain, joint, shoulder   . Seasonal allergies   . Testicular hypofunction   . Tobacco use disorder   . Tremor   . Unspecified vitamin D deficiency       Current Medications:  Outpatient Encounter  Prescriptions as of 05/01/2016  Medication Sig  . aspirin 325 MG tablet Take 325 mg by mouth daily.  . Cholecalciferol (HM VITAMIN D3) 2000 UNITS CAPS Take 2,000 Units by mouth.  . Cyanocobalamin (VITAMIN B12 PO) Take 3,000 mcg by mouth daily. Reported on 08/27/2015  . Echinacea 400 MG CAPS Take 400 mg by mouth daily.  Marland Kitchen GLUCOSAMINE-CHONDROIT-MSM-C-MN PO Take by mouth.  . hydrochlorothiazide (HYDRODIURIL) 25 MG tablet Take 1 tablet (25 mg total) by mouth daily.  Marland Kitchen lisinopril (PRINIVIL,ZESTRIL) 10 MG tablet Take 1 tablet (10 mg total) by mouth daily.  . Multiple Vitamins-Minerals (CENTRUM PO) Take by mouth.  . Omega-3 Fatty Acids (ULTRA OMEGA 3 PO) Take 690 mg by mouth 2 (two) times daily.   . primidone (MYSOLINE) 50 MG tablet Take 75 mg by mouth 2 (two) times daily. Reported on 08/27/2015  . traMADol (ULTRAM) 50 MG tablet Take 1 tablet (50 mg total) by mouth every 6 (six) hours as needed for moderate pain.   No facility-administered encounter medications on file as of 05/01/2016.    The patient reported that he is NOT taking tramadol (was prescribed after a work injury) - never took even a single dose.   Behavioral Observations:   Appearance: Neatly groomed and dressed in  his work uniform Gait: Ambulated independently, no abnormalities observed Movement: Bilateral hand tremor observed  Speech: Fluent; normal rate, rhythm and volume Thought process: Linear, goal directed Affect: Full, euthymic Interpersonal: Pleasant, appropriate   TESTING: There is medical necessity to proceed with pre-surgical neuropsychological evaluation to inform whether the patient might safely proceed with a medical or surgical procedure that may affect brain function (i.e., deep brain stimulation).   PLAN: The patient will return for a full battery of neuropsychological testing with a psychometrician under my supervision. Education regarding testing procedures was provided. Subsequently, the patient will see  this provider for a follow-up session at which time his test performances and my impressions and treatment recommendations will be reviewed in detail.   Full neuropsychological evaluation report to follow.

## 2016-05-14 ENCOUNTER — Ambulatory Visit (INDEPENDENT_AMBULATORY_CARE_PROVIDER_SITE_OTHER): Payer: Managed Care, Other (non HMO) | Admitting: Psychology

## 2016-05-14 DIAGNOSIS — G250 Essential tremor: Secondary | ICD-10-CM | POA: Diagnosis not present

## 2016-05-14 DIAGNOSIS — F419 Anxiety disorder, unspecified: Secondary | ICD-10-CM

## 2016-05-14 NOTE — Progress Notes (Signed)
   Neuropsychology Note  Carlos Hunt returned today for 2 hours of neuropsychological testing with technician, Milana Kidney, BS, under the supervision of Dr. Macarthur Critchley. The patient did not appear overtly distressed by the testing session, per behavioral observation or via self-report to the technician. Rest breaks were offered. Carlos Hunt will return within 2 weeks for a feedback session with Dr. Si Raider at which time his test performances, clinical impressions and treatment recommendations will be reviewed in detail. The patient understands he can contact our office should he require our assistance before this time.  Full report to follow.

## 2016-05-19 NOTE — Progress Notes (Signed)
NEUROPSYCHOLOGICAL EVALUATION   Name:    Carlos Hunt  Date of Birth:   17-Apr-1946 Date of Interview:  05/01/2016 Date of Testing:  05/14/2016   Date of Feedback:  05/20/2016      Background Information:  Reason for Referral:  Carlos Hunt is a 70 y.o., married male referred by Dr. Wells Guiles Tat to assess his current level of cognitive functioning and assist in differential diagnosis. This was also part of a pre-surgical evaluation for deep brain stimulation (DBS) to assist in the management of essential tremor. The current evaluation consisted of a review of available medical records, an interview with the patient and the completion of a neuropsychological testing battery. Informed consent was obtained.   History of Presenting Problem:  Carlos Hunt has a history of essential tremor with first symptoms of bilateral hand tremor in or around 2007. He was prescribed primidone by a previous neurologist but had side effects (i.e., anxiety, paranoid ideation) with this medication and is only able to tolerate a low dose. He does have a reported family history of Parkinson's disease in his oldest brother (now deceased) and his father (in his 29s, now deceased). The patient's daughter has tremors similar to the patient's. Family history is further reportedly significant for dementia in his late mother who first showed signs in her 59s and lived to age 55.  The patient had a dat scan at [facility] which reportedly showed mild asymmetry of uptake in the caudates, less on the left than the right, of uncertain etiology and could be from prominent perivascular space or from remote lacune, but not typical for PD or any parkinsonian state. He also had an MRI of the brain in 2016 that was unremarkable.   The patient reported a history of concussion in February 2016 when slipped on ice. He did lose consciousness for a brief (but unknown) period of time. He denied mental status change, headache, nausea or vomiting  afterwards. He had an MRI of the brain done, which as noted previously, was normal.   At today's appointment, the patient denied having any concerns about his cognitive functioning. He endorsed very mild changes in memory ability over time, which he feels is normal aging. He feels his short-term and long-term memory are both still quite strong.   Upon direct questioning, the patient reported the following:   Forgetting recent conversations/events: No  Repeating statements/questions: On occasion (not often) Misplacing/losing items: No Forgetting appointments or other obligations: No Forgetting to take medications: No  Difficulty concentrating: No Starting but not finishing tasks: No Distracted easily: No  Word-finding difficulty: Always have had that, because my vocabulary is not great.  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 His tremor has significantly impacted his ability to perform job responsibilities. He stated that recently, due to his tremor, it took him 45 minutes to put a control unit in a machine, which is a five-minute job.   Carlos Hunt continues to independently manage all instrumental ADLs including driving, medications, finances/bill-paying, appointments, and cooking. His tremor also interferes with his ability to efficiently complete some of these tasks; for example, it is nearly impossible for him to write out a check. He denies any difficulty performing IADLs due to  cognitive problems, though.  Physically, the patient has no complaints other than his tremors. He denied significant pain, and noted he has a very high pain tolerance. He has not had any falls in the past year. He  denied any difficulty with walking or balance.   With regard to mood, the patient reported that he is always "on the up-swing". He reports euthymic mood with no significant depression or anxiety at the present time. He denies any psychosocial stress at the present time. He has no difficulty sleeping. He feels well rested after six hours of sleep a night. He feels he has a good energy level. His appetite is very good. He denies suicidal ideation or intention. He has not had any hallucinations or other psychosis.   Psychiatric History: The patient denied any prior history of depression. As noted previously, he did experience anxiety and possible paranoid ideation ("always feeling like someone was around") on higher doses of primidone, but this resolved when the dosage was decreased. Otherwise, he has never had any significant anxiety. He has never been treated for a mental health condition. He denied history of substance dependence. He has never had suicidal ideation or intention.   Social History: Carlos Hunt was born and raised in [place]. He completed high school and an associate's degree. 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 He has been in his current position with [employer] for 14 years. Carlos Hunt has been married to his wife for 45 years. They have four children and six grandchildren, and he gets to see them frequently. Carlos Hunt drinks alcohol only occasionally. He reported he was a heavy drinker when he was in [employer] but not after that. He is also a former smoker, having quit 45 years ago. He denied illicit substance abuse.  Medical History:  Past Medical History:  Diagnosis Date  . Blood in stool   . Erectile dysfunction   . GERD (gastroesophageal reflux disease)   . Hx of colonic polyps   . Hypertension   . Pain in left knee   . Pain, joint, shoulder     . Seasonal allergies   . Testicular hypofunction   . Tobacco use disorder   . Tremor   . Unspecified vitamin D deficiency     Current medications:  Outpatient Encounter Prescriptions as of 05/20/2016  Medication Sig  . aspirin 325 MG tablet Take 325 mg by mouth daily.  . Cholecalciferol (HM VITAMIN D3) 2000 UNITS CAPS Take 2,000 Units by mouth.  . Cyanocobalamin (VITAMIN B12 PO) Take 3,000 mcg by mouth daily. Reported on 08/27/2015  . Echinacea 400 MG CAPS Take 400 mg by mouth daily.  Marland Kitchen GLUCOSAMINE-CHONDROIT-MSM-C-MN PO Take by mouth.  . hydrochlorothiazide (HYDRODIURIL) 25 MG tablet Take 1 tablet (25 mg total) by mouth daily.  Marland Kitchen lisinopril (PRINIVIL,ZESTRIL) 10 MG tablet Take 1 tablet (10 mg total) by mouth daily.  . Multiple Vitamins-Minerals (CENTRUM PO) Take by mouth.  . Omega-3 Fatty Acids (ULTRA OMEGA 3 PO) Take 690 mg by mouth 2 (two) times daily.   . primidone (MYSOLINE) 50 MG tablet Take 75 mg by mouth 2 (two) times daily. Reported on 08/27/2015  . traMADol (ULTRAM) 50 MG tablet Take 1 tablet (50 mg total) by mouth every 6 (six) hours as needed for moderate pain.   No facility-administered encounter medications on file as of  05/20/2016.     The patient reported that he is NOT taking tramadol (was prescribed after a work injury) - never took even a single dose.   Current examination:  Behavioral Observations:   Appearance: Neatly groomed and dressed in his work uniform Gait: Ambulated independently, no abnormalities observed Movement: Bilateral hand tremor observed  Speech: Fluent; normal rate, rhythm and volume Thought process: Linear, goal directed Affect: Full, euthymic Interpersonal: Pleasant, appropriate Orientation: Oriented to all spheres. Accurately named the current President and his predecessor.  Tests Administered: . Test of Premorbid Functioning (TOPF) . Wechsler Adult Intelligence Scale-Fourth Edition (WAIS-IV): Similarities, Block Design, Matrix  Reasoning, and Digit Span subtests . [employer] Verbal Learning Test - 2nd Edition (CVLT-2) Short Form . Repeatable Battery for the Assessment of Neuropsychological Status (RBANS) Form A:  Figure Copy and Recall subtests, Story Memory and Recall subtests . Neuropsychological Assessment Battery (NAB) Language Module, Form 1: Naming Subtest . Boston Diagnostic Aphasia Examination: Complex Ideational Material subtest . Symbol Digit Modalities Test (SDMT) . Controlled Oral Word Association Test (COWAT) . Trail Making Test A and B . [employer] (WCST) . Clock drawing test . Geriatric Depression Scale (GDS) 15 Item . Generalized Anxiety Disorder - 7 item screener (GAD-7) . Parkinson's Disease Questionnaire (PDQ-39)  Test Results: Note: Standardized scores are presented only for use by appropriately trained professionals and to allow for any future test-retest comparison. These scores should not be interpreted without consideration of all the information that is contained in the rest of the report. The most recent standardization samples from the test publisher or other sources were used whenever possible to derive standard scores; scores were corrected for age, gender, ethnicity and education when available.   Test Scores:  Test Name Standardized Score Descriptor  TOPF SS= 81 Low average  WAIS-IV Subtests    Similarities ss= 11 Average  Block Design ss= 13 High average  Matrix Reasoning ss= 10 Average  Digit Span ss= 8 Average  RBANS Subtests    Figure Copy Discontinued due to tremor   Figure Recall Discontinued due to tremor   Story Memory Z= -0.1 Average  Story Recall Z= 0.9 High average  CVLT-II Scores    Trial 1 Z= -1 Low average  Trial 4 Z= -1 Low average  Trials 1-4 total T= 47 Average  SD Free Recall Z= -0.5 Average  LD Free Recall Z= 1 High average  LD Cued Recall Z= 1 High average  Recognition Discriminability (8/9 hits, 1 false positive) Z= 0.5 Average    Forced Choice Recognition Raw = 9/9 WNL  NAB Naming T= 58 High average  BDAE Subtest    Complex Ideational Material Raw = 12/12 WNL  SDMT    Written Z= -1.1 Low average  Oral Z= -0.4 Average  COWAT-FAS T= 42 Low average  COWAT-Animals T= 38 Low average  Trail Making Test A 0 errors T= 45 Average  Trail Making Test B 0 errors T= 52 Average  WCST    Total Errors T= 37 Low average  Perseverative Responses T= 46 Average  Perseverative Errors T= 46 Average  Categories Completed 1; 11-16% WNL  Trials to Complete 1st Category 31; 11-16% WNL  Failure to Maintain Set 0 WNL  Clock Drawing  WNL   GDS-15 0/15 WNL   GAD-7 0/21 WNL  PDQ-39    Mobility 10%   Activities of Daily Living 37.5%   Emotional Well Being 0   Stigma 0   Social Support 0  Cognitive Impairment 0   Communication 0   Bodily discomfort 8.3%        Description of Test Results:  Premorbid verbal intellectual abilities were estimated to have been within the low average range based on a test of word reading. Psychomotor processing speed was average. Auditory attention and working memory were average. Visual-spatial construction was high average on a task requiring him to manipulate three dimensional blocks to match a drawn stimulus. He was unable to perform another test of visual-spatial construction (which required him to draw a complex figure) due to his severe tremor. Language abilities were within normal limits. Specifically, confrontation naming was high average, and semantic verbal fluency was low average. Auditory comprehension of complex ideational material was intact. With regard to verbal memory, encoding and acquisition of non-contextual information (i.e., word list) was average. After a brief distracter task, free recall was average. After a delay, free recall was high average. Cued recall was high average. Performance on a yes/no recognition task was average. On another verbal memory test, encoding and  acquisition of contextual auditory information (i.e., short story) was average. After a delay, free recall was high average. Executive functioning was intact overall. Mental flexibility and set-shifting were average on Trails B. Verbal fluency with phonemic search restrictions was low average. Both verbal and non-verbal abstract reasoning were average. Problem solving and deductive reasoning was within normal limits (low average). Performance on a clock drawing task was intact. On self-report questionnaires, the patient did not endorse any symptoms of depression or anxiety  Clinical Impressions: Diagnosis deferred. No cognitive disorder. No psychiatric disorder. Results of cognitive testing were entirely within normal limits. There were no areas of impairment, and there is no evidence of an underlying dementia or cognitive disorder at this time. Furthermore, there was no evidence to suggest any psychopathology. Results of this evaluation will serve as a nice baseline for future comparison if needed. The patient should be encouraged to continue participating in activities that provide mental stimulation, social interaction and safe cardiovascular exercise, in order to maintain brain health and reduce the risk of cognitive decline.   IMPORTANT CONSIDERATIONS FOR DBS CANDIDACY   1. IS THE PATIENT EXPERIENCING COGNITIVE SYMPTOMS THAT FAR EXCEED WHAT IS EXPECTED FOR ET? No. The patient demonstrated no cognitive deficits on objective testing.  2. IS THERE A SEPARATE NEUROLOGICAL PROCESS AT WORK? No.   3. WERE ANY PSYCHOSOCIAL STRESSORS IDENTIFIED WITHIN THE INDIVIDUAL AND/OR FAMILY BEYOND ET THAT MAY IMPACT UPON POST-OPERATIVE ADJUSTMENT? No, Carlos Hunt appears to have good social support and coping skills. Furthermore, he is not demonstrating any signs of depression, anxiety or other psychological condition.  4. CAN THIS PERSON COPE WITH THE STRESS OF SURGERY AND BE COMPLIANT AS AN AWAKE PARTICIPANT IN  SURGERY? It is my impression that he would be able to tolerate all aspects of the DBS procedure.   5. CAN THIS PERSON PARTICIPATE IN THE MULTIPLE POST-OPERATIVE PROGRAMMING SESSIONS AND MEDICATION ADJUSTMENTS? Yes. Carlos Hunt does not demonstrate any cognitive symptoms that would limit his ability to manage and participate in the procedure and required follow-up care. He appears to have realistic expectations of the DBS procedure and good motivation for all aspects of participation.   6. FROM A NEUROPSYCHOLOGICAL PERSPECTIVE, DOES THIS PERSON APPEAR TO BE A GOOD CANDIDATE FOR DBS? Yes.       Feedback to Patient: Carlos Hunt returned for a feedback appointment on 05/20/2016 to review the results of his neuropsychological evaluation with this provider. 15  minutes face-to-face time was spent reviewing his test results, my impressions and my recommendations as detailed above.    Total time spent on this patient's case: [id number] unit for interview with psychologist; [phone] units of testing by psychometrician under psychologist's supervision; [phone] units for medical record review, scoring of neuropsychological tests, interpretation of test results, preparation of this report, and review of results to the patient by psychologist.      Thank you for your referral of [place]. Please feel free to contact me if you have any questions or concerns regarding this report.

## 2016-05-20 ENCOUNTER — Encounter: Payer: Self-pay | Admitting: Psychology

## 2016-05-20 ENCOUNTER — Ambulatory Visit (INDEPENDENT_AMBULATORY_CARE_PROVIDER_SITE_OTHER): Payer: Managed Care, Other (non HMO) | Admitting: Psychology

## 2016-05-20 ENCOUNTER — Telehealth: Payer: Self-pay | Admitting: Neurology

## 2016-05-20 DIAGNOSIS — G250 Essential tremor: Secondary | ICD-10-CM

## 2016-05-20 DIAGNOSIS — F419 Anxiety disorder, unspecified: Secondary | ICD-10-CM

## 2016-05-20 DIAGNOSIS — Z01818 Encounter for other preprocedural examination: Secondary | ICD-10-CM

## 2016-05-20 NOTE — Telephone Encounter (Signed)
LMOM for patient to call back to talk about next step for surgery. Awaiting call back.

## 2016-05-20 NOTE — Telephone Encounter (Signed)
Tell patient neuropsych testing looked good.  If still wanting proceed, schedule for pre-op MRI and referral to [name].  I will need appt with him after those to go over last min questions/instructions and do pre-op video

## 2016-05-21 NOTE — Telephone Encounter (Signed)
Spoke with patient and MR scheduled for 05/23/16 at 9:00 am.

## 2016-05-21 NOTE — Telephone Encounter (Signed)
Spoke with patient's wife and made aware neuropsych results okay. Appt cancelled for tomorrow.  Referral faxed to [facility] at [phone] with confirmation received. They will contact the patient to schedule. Patient's wife states patient will call me back and let me know when to schedule MR because he has a lot of appts coming up.  Order entered for MR. Awaiting patient's call back.

## 2016-05-21 NOTE — Progress Notes (Deleted)
Carlos Hunt was seen today in the movement disorders clinic for neurologic consultation at the request of SMITH,KRISTI, MD.  I have reviewed numerous prior records made available to me.  This patient is accompanied in the office by his spouse who supplements the history.  The patient presents today for a second opinion regarding essential tremor.  He has been seeing [name] in [place] since 03/02/2014 and his last appointment with him was on November 05, 2015.  The patient reports that tremor began slowly, in approximately 2007.  Tremor started in both hands. Pt is R hand dominant but both hands shake. The patient notices tremor most when the hand is in use.  There is a family history of tremor in the patient's father, daughter, and brother, but his brother and father do have a known diagnosis of Parkinson's disease.  On his first visit with [name], the patient was started on primidone, 50 mg twice a day for essential tremor.  At his follow-up 2 months later, the patient reported that tremor was better, although he felt that the medication was causing significant dreams.  He continued on the medication until his February, 2017 follow-up and his primidone was increased to 125 mg twice a day.  Jaw tremor was noted that visit.  The patient called the neurology office a few weeks later because of anxiety and just "felt weird" and primidone was reduced to 75 mg twice a day.  04/24/16 update:  The patient was up today, accompanied by his wife who supplements the history.  Records were reviewed and are made available to me.  The patient had a dat scan at [facility] since our last visit.  There was mild asymmetry of update in caudates, less on the left than the right of uncertain etiology and could be from prominent perivascular space or from remote lacute but said not typical for PD or any parkinsonian state.  He had an MRI of the brain in 2016 that was unremarkable.  05/22/16 update:  The patient returns  today, accompanied by his wife who supplements the history.  He had neuropsych testing since our last visit.  This was normal, without any evidence of cognitive dysfunction.  Dr. Si Raider felt that he would be a good DBS candidate from her standpoint.  The patient expresses continued desire to proceed with possible DBS.  Neuroimaging has previously been performed.  It is available for my review today.  MRI was done 10/02/14 and was unremarkable.    ALLERGIES:   Allergies  Allergen Reactions  . Other Nausea And Vomiting    NOVACAINE    CURRENT MEDICATIONS:  Outpatient Encounter Prescriptions as of 05/22/2016  Medication Sig  . aspirin 325 MG tablet Take 325 mg by mouth daily.  . Cholecalciferol (HM VITAMIN D3) 2000 UNITS CAPS Take 2,000 Units by mouth.  . Cyanocobalamin (VITAMIN B12 PO) Take 3,000 mcg by mouth daily. Reported on 08/27/2015  . Echinacea 400 MG CAPS Take 400 mg by mouth daily.  Marland Kitchen GLUCOSAMINE-CHONDROIT-MSM-C-MN PO Take by mouth.  . hydrochlorothiazide (HYDRODIURIL) 25 MG tablet Take 1 tablet (25 mg total) by mouth daily.  Marland Kitchen lisinopril (PRINIVIL,ZESTRIL) 10 MG tablet Take 1 tablet (10 mg total) by mouth daily.  . Multiple Vitamins-Minerals (CENTRUM PO) Take by mouth.  . Omega-3 Fatty Acids (ULTRA OMEGA 3 PO) Take 690 mg by mouth 2 (two) times daily.   . primidone (MYSOLINE) 50 MG tablet Take 75 mg by mouth 2 (two) times daily. Reported on  08/27/2015  . traMADol (ULTRAM) 50 MG tablet Take 1 tablet (50 mg total) by mouth every 6 (six) hours as needed for moderate pain.   No facility-administered encounter medications on file as of 05/22/2016.     PAST MEDICAL HISTORY:   Past Medical History:  Diagnosis Date  . Blood in stool   . Erectile dysfunction   . GERD (gastroesophageal reflux disease)   . Hx of colonic polyps   . Hypertension   . Pain in left knee   . Pain, joint, shoulder   . Seasonal allergies   . Testicular hypofunction   . Tobacco use disorder   . Tremor   .  Unspecified vitamin D deficiency     PAST SURGICAL HISTORY:   Past Surgical History:  Procedure Laterality Date  . COLONOSCOPY  09/09/2011   normal.  Previous polyps.  [name]. Repeat 5 years.  Marland Kitchen HAND SURGERY Left 2011   trauma    SOCIAL HISTORY:   Social History   Social History  . Marital status: Married    Spouse name: N/A  . Number of children: 4  . Years of education: N/A   Occupational History  . building maintenance     [employer]   Social History Main Topics  . Smoking status: Former Smoker    Packs/day: 3.00    Years: 10.00    Types: Cigarettes    Quit date: 09/23/1970  . Smokeless tobacco: Never Used     Comment: quit in the 1970's  . Alcohol use 0.0 oz/week     Comment: occasional; once every 6 months  . Drug use: No  . Sexual activity: Yes   Other Topics Concern  . Not on file   Social History Narrative   Marital status: married x 45 years.      Children: 2 daughters, 2 sons; 6 grandchildren.      Lives: with wife.        Employment:  [employer] x 13 years; moderately happy.  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      Tobacco: quit 42 years ago; smoked x 10 years.       Alcohol: socially; weekends.  Rarely.      Drugs: none      Exercise:  None; work is physical.        Seatbelt: 100%      Sunscreen: rare sun exposure      Guns:  4 gun; unloaded, not stored in locked cabinet.       Smoke alarm and carbon monoxide detector in the home.      Caffeine use: consumes a minimal amount   Living Will: patient does NOT have Living Will; Does NOT have HCPOA; getting ready to work on it   Organ Donor: NO          FAMILY HISTORY:   Family Status  Relation Status  . Mother Deceased at age 66   dementia  . Father Deceased at age 78   PD, heart disease  . Sister Alive   unknown  . Brother Deceased at age 42   Parkinson's disease  . Sister Alive   unknown  . Brother Alive   unknown  . Brother Alive   unknown  . Daughter Alive    tremor  . Son Alive   healthy    ROS:  A complete 10 system review of systems was obtained and was unremarkable apart from what is mentioned above.  PHYSICAL EXAMINATION:  VITALS:   There were no vitals filed for this visit.  GEN:  The patient appears stated age and is in NAD. HEENT:  Normocephalic, atraumatic.  The mucous membranes are moist. The superficial temporal arteries are without ropiness or tenderness. CV:  RRR Lungs:  CTAB Neck/HEME:  There are no carotid bruits bilaterally.  Neurological examination:  Orientation: The patient is alert and oriented x3. Fund of knowledge is appropriate.  Recent and remote memory are intact.  Attention and concentration are normal.    Able to name objects and repeat phrases. Cranial nerves: There is good facial symmetry. Pupils are equal round and reactive to light bilaterally. Fundoscopic exam reveals clear margins bilaterally. Extraocular muscles are intact. The visual fields are full to confrontational testing. The speech is fluent and clear. Soft palate rises symmetrically and there is no tongue deviation. Hearing is intact to conversational tone. Sensation: Sensation is intact to light and pinprick throughout (facial, trunk, extremities). Vibration is intact at the bilateral big toe. There is no extinction with double simultaneous stimulation. There is no sensory dermatomal level identified. Motor: Strength is 5/5 in the bilateral upper and lower extremities.   Shoulder shrug is equal and symmetric.  There is no pronator drift.   Movement examination: Tone: There is normal tone in the bilateral upper extremities (may be slight hint of rigidity in the RUE).  The tone in the lower extremities is normal.  Abnormal movements: There is a LUE resting tremor noted only with distraction.  There is a L>RLE resting tremor with distraction.  There is a chin tremor.  There is postural tremor, worse in the wing beating position, R more than L.  He has  significant trouble with archimedes spirals, R more than L.  Spills water all over when pouring from one glass to another.   Coordination:  There is  decremation with RAM's, only with toe taps on the L.  All other forms of RAM's are normal, with any form of RAMS, including alternating supination and pronation of the forearm, hand opening and closing, finger taps, heel taps bilaterally.  Gait and Station: The patient has no difficulty arising out of a deep-seated chair without the use of the hands. The patient's stride length is normal with normal stride length.  The patient has a negative pull test.      ASSESSMENT/PLAN:  1.  Tremor  -He likely has long standing ET that is now manifesting as a resting tremor.   -The patient had a dat scan at [facility] since our last visit.  There was mild asymmetry of update in caudates, less on the left than the right of uncertain etiology and could be from prominent perivascular space or from remote lacute but said not typical for PD or any parkinsonian state.  He had an MRI of the brain in 2016 that was unremarkable.  -He has found primidone to be ineffective for his tremor and he is unable to tolerate higher dosages.  He really is not a candidate for beta blocker therapy.  His pulse is only 53.  His age really is somewhat of a contraindication to starting him on a purely anticholinergic medication for tremor such as Artane or Cogentin.  Second line medication such as gabapentin or topiramate are likely not be helpful for his degree of tremor.  -I talked to the patient again about the logistics associated with DBS therapy.  I talked to the patient about risks/benefits/side effects of DBS therapy.  We talked about risks  which included but were not limited to infection, paralysis, intraoperative seizure, death, stroke, bleeding around the electrode.   I talked to patient about fiducial placement 1 week prior to DBS therapy.  I talked to the patient about what to expect in  the operating room, including the fact that this is an awake surgery.  We talked about battery placement as well as which is done under general anesthesia, generally approximately one week following the initial surgery.  Pre-op video done today.  Awaiting an appt with [name].  Will order pre-op MRI.  Confirmed that this will be a ***unilateral surgery of the ***ViM. 2.  F/U with [name] as previously scheduled.  Much greater than 50% of this visit was spent in counseling and coordinating care.  Total face to face time:  45 min

## 2016-05-22 ENCOUNTER — Ambulatory Visit: Payer: Managed Care, Other (non HMO) | Admitting: Neurology

## 2016-05-22 ENCOUNTER — Telehealth: Payer: Self-pay | Admitting: Neurology

## 2016-05-22 NOTE — Telephone Encounter (Signed)
Authorization complete.

## 2016-05-22 NOTE — Telephone Encounter (Signed)
Do they know its pre-op MRI for DBS (per protocol)

## 2016-05-22 NOTE — Telephone Encounter (Signed)
Precert called and needed a call back refarding PT/Dawn CB#[phone]

## 2016-05-22 NOTE — Telephone Encounter (Signed)
Yes

## 2016-05-23 ENCOUNTER — Ambulatory Visit (HOSPITAL_COMMUNITY)
Admission: RE | Admit: 2016-05-23 | Discharge: 2016-05-23 | Disposition: A | Discharge status: Home / Self Care | Payer: Managed Care, Other (non HMO) | Source: Ambulatory Visit | Attending: Neurology | Admitting: Neurology

## 2016-05-23 DIAGNOSIS — G250 Essential tremor: Secondary | ICD-10-CM | POA: Diagnosis present

## 2016-05-23 DIAGNOSIS — Z01818 Encounter for other preprocedural examination: Secondary | ICD-10-CM

## 2016-05-23 LAB — CREATININE, SERUM
Creatinine, Ser: 1.14 mg/dL (ref 0.61–1.24)
GFR calc Af Amer: >60 mL/min (ref >60)
GFR calc non Af Amer: >60 mL/min (ref >60)

## 2016-05-23 MED ORDER — GADOBENATE DIMEGLUMINE 529 MG/ML IV SOLN
20.0000 mL | Freq: Once | INTRAVENOUS | Status: AC | PRN
  Administered 2016-05-23: 20 mL via INTRAVENOUS

## 2016-06-03 ENCOUNTER — Encounter: Payer: Self-pay | Admitting: Family Medicine

## 2016-06-03 ENCOUNTER — Ambulatory Visit (INDEPENDENT_AMBULATORY_CARE_PROVIDER_SITE_OTHER): Payer: Managed Care, Other (non HMO) | Admitting: Family Medicine

## 2016-06-03 VITALS — BP 140/82 | HR 90 | Temp 98.7°F | Resp 18 | Ht 71.0 in | Wt 208.0 lb

## 2016-06-03 DIAGNOSIS — Z23 Encounter for immunization: Secondary | ICD-10-CM | POA: Diagnosis not present

## 2016-06-03 DIAGNOSIS — D1800 Hemangioma unspecified site: Secondary | ICD-10-CM | POA: Diagnosis not present

## 2016-06-03 DIAGNOSIS — K4090 Unilateral inguinal hernia, without obstruction or gangrene, not specified as recurrent: Secondary | ICD-10-CM

## 2016-06-03 DIAGNOSIS — D175 Benign lipomatous neoplasm of intra-abdominal organs: Secondary | ICD-10-CM

## 2016-06-03 DIAGNOSIS — G250 Essential tremor: Secondary | ICD-10-CM | POA: Diagnosis not present

## 2016-06-03 DIAGNOSIS — I10 Essential (primary) hypertension: Secondary | ICD-10-CM

## 2016-06-03 DIAGNOSIS — D1771 Benign lipomatous neoplasm of kidney: Secondary | ICD-10-CM

## 2016-06-03 MED ORDER — HYDROCHLOROTHIAZIDE 25 MG PO TABS: 25.0000 mg | ORAL_TABLET | Freq: Every day | ORAL | 3 refills | Status: DC

## 2016-06-03 MED ORDER — LISINOPRIL 10 MG PO TABS: 10.0000 mg | ORAL_TABLET | Freq: Every day | ORAL | 3 refills | Status: DC

## 2016-06-03 NOTE — Progress Notes (Signed)
Patient ID: Carlos Hunt, male   DOB: Sep 26, 1945, 70 y.o.   MRN: [id number]   Subjective:  By signing my name below, I, [name], attest that this documentation has been prepared under the direction and in the presence of [name], MD Electronically Signed: Ladene Artist, ED Scribe 06/03/2016 at 4:04 PM.   Patient ID: Carlos Hunt, male    DOB: 11-10-1945, 70 y.o.   MRN: [id number]  06/03/2016  Follow-up (BLOOD PRESSURE)  HPI HPI Comments: Carlos Hunt is a 70 y.o. male who presents to the [facility] for a follow-up regarding HTN. Pt needs a refill of hydrochlorothiazide. Pt's colonoscopy is scheduled for November 10 by Dr. Tiffany Kocher. He already had his flu vaccine. Pt states that he is preparing for [place] which will build a halo around his brain for 4-6 weeks to correct his tremors and stop primidone. The treatment is 99% effective for tremor and pay will be discharged the next day. He states that he has had a pre-surgery MRI but will not be able to have a MRI after the procedure because it will "blow a whole in his brain". Pt has already been evaluated by psychiatry. He states that he was not thrilled to "have wholes drilled in his head" but he wants to have the tremor improved because he desires to continue to work. 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 [facility] will be doing the procedure at [facility]. Pt's MR abdomen in February 2017 showed a benign hemangioma in the right liver and no suspicious masses. There was a tiny bloody fatty lesion in the left kidney. Pt also states that his inguinal hernia has grown in size. Her reports a burning sensation to the area over the past 2 months. Pt denies heavy lifting but states that he has been walking more often and put on more often. Pt has been checking his blood pressure at home with an average reading of 125/60. He denies chest pain.   Depression screen [facility] 2/9  06/03/2016 12/17/2015 11/27/2015 08/27/2015 08/17/2015  Decreased Interest 0 0 0 0 0  Down, Depressed, Hopeless 0 0 0 0 0  PHQ - 2 Score 0 0 0 0 0    Review of Systems  Constitutional: Negative for activity change, appetite change, chills, diaphoresis, fatigue and fever.  Respiratory: Negative for cough and shortness of breath.   Cardiovascular: Negative for chest pain, palpitations and leg swelling.  Gastrointestinal: Positive for abdominal distention and abdominal pain. Negative for diarrhea, nausea and vomiting.  Endocrine: Negative for cold intolerance, heat intolerance, polydipsia, polyphagia and polyuria.  Skin: Negative for color change, rash and wound.  Neurological: Positive for tremors. Negative for dizziness, seizures, syncope, facial asymmetry, speech difficulty, weakness, light-headedness, numbness and headaches.  Psychiatric/Behavioral: Negative for dysphoric mood and sleep disturbance. The patient is not nervous/anxious.     Past Medical History:  Diagnosis Date  . Blood in stool   . Erectile dysfunction   . GERD (gastroesophageal reflux disease)   . Hx of colonic polyps   . Hypertension   . Pain in left knee   . Pain, joint, shoulder   . Seasonal allergies   . Testicular hypofunction   . Tobacco use disorder   . Tremor   . Unspecified vitamin D deficiency    Past Surgical History:  Procedure Laterality Date  . COLONOSCOPY  09/09/2011   normal.  Previous polyps.  [name]. Repeat 5 years.  Marland Kitchen HAND SURGERY Left 2011   trauma   Allergies  Allergen Reactions  . Other Nausea And Vomiting    NOVACAINE   Current Outpatient Prescriptions  Medication Sig Dispense Refill  . aspirin 325 MG tablet Take 325 mg by mouth daily.    . Cholecalciferol (HM VITAMIN D3) 2000 UNITS CAPS Take 2,000 Units by mouth.    . Cyanocobalamin (VITAMIN B12 PO) Take 3,000 mcg by mouth daily. Reported on 08/27/2015    . Echinacea 400 MG CAPS Take 400 mg by mouth daily.    Marland Kitchen  GLUCOSAMINE-CHONDROIT-MSM-C-MN PO Take by mouth.    . hydrochlorothiazide (HYDRODIURIL) 25 MG tablet Take 1 tablet (25 mg total) by mouth daily. 90 tablet 3  . lisinopril (PRINIVIL,ZESTRIL) 10 MG tablet Take 1 tablet (10 mg total) by mouth daily. 90 tablet 3  . Multiple Vitamins-Minerals (CENTRUM PO) Take by mouth.    . Omega-3 Fatty Acids (ULTRA OMEGA 3 PO) Take 690 mg by mouth 2 (two) times daily.     . primidone (MYSOLINE) 50 MG tablet Take 75 mg by mouth 2 (two) times daily. Reported on 08/27/2015    . traMADol (ULTRAM) 50 MG tablet Take 1 tablet (50 mg total) by mouth every 6 (six) hours as needed for moderate pain. (Patient not taking: Reported on 06/03/2016) 15 tablet 0   No current facility-administered medications for this visit.    Social History   Social History  . Marital status: Married    Spouse name: N/A  . Number of children: 4  . Years of education: N/A   Occupational History  . building maintenance     [employer]   Social History Main Topics  . Smoking status: Former Smoker    Packs/day: 3.00    Years: 10.00    Types: Cigarettes    Quit date: 09/23/1970  . Smokeless tobacco: Never Used     Comment: quit in the 1970's  . Alcohol use 0.0 oz/week     Comment: occasional; once every 6 months  . Drug use: No  . Sexual activity: Yes   Other Topics Concern  . Not on file   Social History Narrative   Marital status: married x 45 years.      Children: 2 daughters, 2 sons; 6 grandchildren.      Lives: with wife.        Employment:  [employer] x 13 years; moderately happy.  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      Tobacco: quit 42 years ago; smoked x 10 years.       Alcohol: socially; weekends.  Rarely.      Drugs: none      Exercise:  None; work is physical.        Seatbelt: 100%      Sunscreen: rare sun exposure      Guns:  4 gun; unloaded, not stored in locked cabinet.       Smoke alarm and carbon monoxide detector in the home.      Caffeine  use: consumes a minimal amount   Living Will: patient does NOT have Living Will; Does NOT have HCPOA; getting ready to work on it   Organ Donor: NO         Family History  Problem Relation Age of Onset  . Dementia Mother   . Parkinson's disease Father   . Cancer Father 83    prostate  . Heart disease Father 46  CAD/CABG  . Hypertension Father   . Hyperlipidemia Father   . Parkinsonism Father   . Parkinson's disease Brother   . Diabetes Sister        Objective:    BP 140/82 (BP Location: Right Arm, Patient Position: Sitting, Cuff Size: [place]zaguirre)   Pulse 90   Temp 98.7 F (37.1 C) (Oral)   Resp 18   Ht 5' 11"  (1.803 m)   Wt 208 lb (94.3 kg)   SpO2 95%   BMI 29.01 kg/m  Physical Exam  Constitutional: He is oriented to person, place, and time. He appears well-developed and well-nourished. No distress.  HENT:  Head: Normocephalic and atraumatic.  Right Ear: External ear normal.  Left Ear: External ear normal.  Nose: Nose normal.  Mouth/Throat: Oropharynx is clear and moist.  Eyes: Conjunctivae and EOM are normal. Pupils are equal, round, and reactive to light.  Neck: Normal range of motion. Neck supple. Carotid bruit is not present. No thyromegaly present.  Cardiovascular: Normal rate, regular rhythm, normal heart sounds and intact distal pulses.  Exam reveals no gallop and no friction rub.   No murmur heard. Pulmonary/Chest: Effort normal and breath sounds normal. He has no wheezes. He has no rales.  Abdominal: Soft. Bowel sounds are normal. He exhibits no distension and no mass. There is no tenderness. There is no rebound and no guarding. A hernia is present. Hernia confirmed positive in the right inguinal area.  Genitourinary:  Genitourinary Comments: Large non-reducible right inguinal hernia   Lymphadenopathy:    He has no cervical adenopathy.  Neurological: He is alert and oriented to person, place, and time. He displays tremor. No cranial nerve deficit.  Skin: Skin  is warm and dry. No rash noted. He is not diaphoretic.  Psychiatric: He has a normal mood and affect. His behavior is normal.  Nursing note and vitals reviewed.      Assessment & Plan:   1. Essential hypertension, benign   2. Essential tremor   3. Renal angiolipoma   4. Hemangioma   5. Right inguinal hernia   6. Need for prophylactic vaccination and inoculation against influenza     Orders Placed This Encounter  Procedures  . MR MRA ABDOMEN W WO CONTRAST    Standing Status:   Future    Standing Expiration Date:   12/01/2016    Order Specific Question:   If indicated for the ordered procedure, I authorize the administration of contrast media per Radiology protocol    Answer:   Yes    Order Specific Question:   Reason for Exam (SYMPTOM  OR DIAGNOSIS REQUIRED)    Answer:   liver hemangioma; kidney angiolipoma    Order Specific Question:   Preferred imaging location?    Answer:   [facility] (table limit-350lbs)    Order Specific Question:   What is the patient's sedation requirement?    Answer:   No Sedation    Order Specific Question:   Does the patient have a pacemaker or implanted devices?    Answer:   No  . Flu Vaccine QUAD 36+ mos IM  . CBC with Differential/Platelet  . Comprehensive metabolic panel  . Ambulatory referral to General Surgery    Referral Priority:   Routine    Referral Type:   Surgical    Referral Reason:   Specialty Services Required    Requested Specialty:   General Surgery    Number of Visits Requested:   1  . Care order/instruction:  AVS and GO    Scheduling Instructions:     AVS and GO   Meds ordered this encounter  Medications  . hydrochlorothiazide (HYDRODIURIL) 25 MG tablet    Sig: Take 1 tablet (25 mg total) by mouth daily.    Dispense:  90 tablet    Refill:  3  . lisinopril (PRINIVIL,ZESTRIL) 10 MG tablet    Sig: Take 1 tablet (10 mg total) by mouth daily.    Dispense:  90 tablet    Refill:  3    Return in about 6 months (around  12/01/2016) for complete physical examiniation.   I personally performed the services described in this documentation, which was scribed in my presence. The recorded information has been reviewed and considered.  Katlin Bortner Elayne Guerin, M.D. [facility] [address] [place], [place]  [address] [phone] phone [phone] fax

## 2016-06-03 NOTE — Patient Instructions (Addendum)
In [place]:  Dr. Waymon Amato, Dr. Jamal Collin, Dr. Geryl Rankins In [place]:  Johnathan Hausen, [place]    IF you received an x-ray today, you will receive an invoice from [facility]. Please contact [facility] at [phone] with questions or concerns regarding your invoice.   IF you received labwork today, you will receive an invoice from [employer]. Please contact [facility] at [phone] with questions or concerns regarding your invoice.   Our billing staff will not be able to assist you with questions regarding bills from these companies.  You will be contacted with the lab results as soon as they are available. The fastest way to get your results is to activate your My Chart account. Instructions are located on the last page of this paperwork. If you have not heard from [place] regarding the results in 2 weeks, please contact this office.     Inguinal Hernia, Adult Muscles help keep everything in the body in its proper place. But if a weak spot in the muscles develops, something can poke through. That is called a hernia. When this happens in the lower part of the belly (abdomen), it is called an inguinal hernia. (It takes its name from a part of the body in this region called the inguinal canal.) A weak spot in the wall of muscles lets some fat or part of the Werts intestine bulge through. An inguinal hernia can develop at any age. Men get them more often than women. CAUSES  In adults, an inguinal hernia develops over time.  It can be triggered by:  Suddenly straining the muscles of the lower abdomen.  Lifting heavy objects.  Straining to have a bowel movement. Difficult bowel movements (constipation) can lead to this.  Constant coughing. This may be caused by smoking or lung disease.  Being overweight.  Being pregnant.  Working at a job that requires long periods of standing or heavy lifting.  Having had an inguinal hernia before. One  type can be an emergency situation. It is called a strangulated inguinal hernia. It develops if part of the Mccreery intestine slips through the weak spot and cannot get back into the abdomen. The blood supply can be cut off. If that happens, part of the intestine may die. This situation requires emergency surgery. SYMPTOMS  Often, a [place] inguinal hernia has no symptoms. It is found when a healthcare provider does a physical exam. Larger hernias usually have symptoms.   In adults, symptoms may include:  A lump in the groin. This is easier to see when the person is standing. It might disappear when lying down.  In men, a lump in the scrotum.  Pain or burning in the groin. This occurs especially when lifting, straining or coughing.  A dull ache or feeling of pressure in the groin.  Signs of a strangulated hernia can include:  A bulge in the groin that becomes very painful and tender to the touch.  A bulge that turns red or purple.  Fever, nausea and vomiting.  Inability to have a bowel movement or to pass gas. DIAGNOSIS  To decide if you have an inguinal hernia, a healthcare provider will probably do a physical examination.  This will include asking questions about any symptoms you have noticed.  The healthcare provider might feel the groin area and ask you to cough. If an inguinal hernia is felt, the healthcare provider may try to slide it back into the abdomen.  Usually no other tests are needed. TREATMENT  Treatments can vary. The size of the hernia makes a difference. Options include:  Watchful waiting. This is often suggested if the hernia is [place] and you have had no symptoms.  No medical procedure will be done unless symptoms develop.  You will need to watch closely for symptoms. If any occur, contact your healthcare provider right away.  Surgery. This is used if the hernia is larger or you have symptoms.  Open surgery. This is usually an outpatient procedure (you will  not stay overnight in a hospital). An cut (incision) is made through the skin in the groin. The hernia is put back inside the abdomen. The weak area in the muscles is then repaired by herniorrhaphy or hernioplasty. Herniorrhaphy: in this type of surgery, the weak muscles are sewn back together. Hernioplasty: a patch or mesh is used to close the weak area in the abdominal wall.  Laparoscopy. In this procedure, a surgeon makes Ladson incisions. A thin tube with a tiny video camera (called a laparoscope) is put into the abdomen. The surgeon repairs the hernia with mesh by looking with the video camera and using two long instruments. HOME CARE INSTRUCTIONS   After surgery to repair an inguinal hernia:  You will need to take pain medicine prescribed by your healthcare provider. Follow all directions carefully.  You will need to take care of the wound from the incision.  Your activity will be restricted for awhile. This will probably include no heavy lifting for several weeks. You also should not do anything too active for a few weeks. When you can return to work will depend on the type of job that you have.  During "watchful waiting" periods, you should:  Maintain a healthy weight.  Eat a diet high in fiber (fruits, vegetables and whole grains).  Drink plenty of fluids to avoid constipation. This means drinking enough water and other liquids to keep your urine clear or pale yellow.  Do not lift heavy objects.  Do not stand for long periods of time.  Quit smoking. This should keep you from developing a frequent cough. SEEK MEDICAL CARE IF:   A bulge develops in your groin area.  You feel pain, a burning sensation or pressure in the groin. This might be worse if you are lifting or straining.  You develop a fever of more than 100.5 F (38.1 C). SEEK IMMEDIATE MEDICAL CARE IF:   Pain in the groin increases suddenly.  A bulge in the groin gets bigger suddenly and does not go down.  For  men, there is sudden pain in the scrotum. Or, the size of the scrotum increases.  A bulge in the groin area becomes red or purple and is painful to touch.  You have nausea or vomiting that does not go away.  You feel your heart beating much faster than normal.  You cannot have a bowel movement or pass gas.  You develop a fever of more than 102.0 F (38.9 C).   This information is not intended to replace advice given to you by your health care provider. Make sure you discuss any questions you have with your health care provider.   Document Released: 01/11/2009 Document Revised: 11/17/2011 Document Reviewed: 02/26/2015 Elsevier Interactive Patient Education [employer].

## 2016-06-04 LAB — CBC WITH DIFFERENTIAL/PLATELET
Basophils Absolute: 0 cells/uL (ref 0–200)
Basophils Relative: 0 %
Eosinophils Absolute: 192 cells/uL (ref 15–500)
Eosinophils Relative: 2 %
HCT: 45.4 % (ref 38.5–50.0)
Hemoglobin: 16.1 g/dL (ref 13.2–17.1)
Lymphocytes Relative: 25 %
Lymphs Abs: 2400 cells/uL (ref 850–3900)
MCH: 32.6 pg (ref 27.0–33.0)
MCHC: 35.5 g/dL (ref 32.0–36.0)
MCV: 91.9 fL (ref 80.0–100.0)
MPV: 9.8 fL (ref 7.5–12.5)
Monocytes Absolute: 864 cells/uL (ref 200–950)
Monocytes Relative: 9 %
Neutro Abs: 6144 cells/uL (ref 1500–7800)
Neutrophils Relative %: 64 %
Platelets: 337 10*3/uL (ref 140–400)
RBC: 4.94 MIL/uL (ref 4.20–5.80)
RDW: 13.4 % (ref 11.0–15.0)
WBC: 9.6 10*3/uL (ref 3.8–10.8)

## 2016-06-04 LAB — COMPREHENSIVE METABOLIC PANEL
ALT: 44 U/L (ref 9–46)
AST: 35 U/L (ref 10–35)
Albumin: 4.7 g/dL (ref 3.6–5.1)
Alkaline Phosphatase: 74 U/L (ref 40–115)
BUN: 19 mg/dL (ref 7–25)
CO2: 24 mmol/L (ref 20–31)
Calcium: 9.9 mg/dL (ref 8.6–10.3)
Chloride: 101 mmol/L (ref 98–110)
Creat: 1.11 mg/dL (ref 0.70–1.18)
Glucose, Bld: 85 mg/dL (ref 65–99)
Potassium: 4.3 mmol/L (ref 3.5–5.3)
Sodium: 139 mmol/L (ref 135–146)
Total Bilirubin: 0.4 mg/dL (ref 0.2–1.2)
Total Protein: 7.6 g/dL (ref 6.1–8.1)

## 2016-06-06 ENCOUNTER — Telehealth: Payer: Self-pay

## 2016-06-06 ENCOUNTER — Telehealth: Payer: Self-pay | Admitting: *Deleted

## 2016-06-06 NOTE — Telephone Encounter (Signed)
From: Princella Ion Sent: 06/06/2016 10:17 AM To: Wardell Honour, MD Subject: Need additional info for MRA authorization.   Hello, the insurance has denied our request for an MRA pending further review. I would be happy to send over more clinical documentation to get the procedure authorized and scheduled but I need the notes to be completed. Thank you!   Phone number [phone].   Case number [id number] Please call for more additional info. Notes not done  Papers in your box in lounge.

## 2016-06-06 NOTE — Telephone Encounter (Signed)
Made in error

## 2016-06-17 ENCOUNTER — Telehealth: Payer: Self-pay

## 2016-06-17 DIAGNOSIS — K4090 Unilateral inguinal hernia, without obstruction or gangrene, not specified as recurrent: Secondary | ICD-10-CM

## 2016-06-17 NOTE — Telephone Encounter (Signed)
PATIENT STATES DR. Tamala Julian GAVE HIM 3 OR 4 NAMES OF SURGEONS TO CHOOSE FOR THE HERNIA SURGERY HE NEEDS TO GET. SHE TOLD HIM TO CALL HER BACK WHEN HE DECIDED WHICH ONE. HE CHOSE DR. ELI. BEST PHONE [phone] (CELL)  [place]

## 2016-06-18 NOTE — Telephone Encounter (Signed)
Noted.  Referral to Dr. Pat Patrick of general surgery placed.

## 2016-06-23 ENCOUNTER — Telehealth: Payer: Self-pay

## 2016-06-23 ENCOUNTER — Other Ambulatory Visit: Payer: Self-pay | Admitting: Family Medicine

## 2016-06-23 DIAGNOSIS — D1771 Benign lipomatous neoplasm of kidney: Secondary | ICD-10-CM

## 2016-06-23 NOTE — Telephone Encounter (Signed)
Order changed to MRI, pending further review. Clinical TL -- please call and provide additional information! Thank you!   [phone]   Service Order [id number]

## 2016-06-27 NOTE — Telephone Encounter (Signed)
Thank you :)

## 2016-06-27 NOTE — Telephone Encounter (Signed)
Dr Tamala Julian, we don't have your OV notes to refer to for this review. I'm not sure if they just need a peer to peer, in which case you would have to call, or if you address info they will need in your OV notes we can try to give them clinical info. Thank you!

## 2016-06-27 NOTE — Telephone Encounter (Signed)
Spoke with [employer] Rep and updated clinical information needed [employer] will provide approval or denial fax or phone no later than next Tuesday  Thanks [employer]

## 2016-06-27 NOTE — Telephone Encounter (Signed)
Peer to peer performed.  MRI abdomen approved.  [employer] will fax over approval today.

## 2016-06-27 NOTE — Telephone Encounter (Signed)
As far as I can tell, this has not yet been completed. They need more information asap. There is a time limit. I cannnot fax in notes as they are not signed. If able to call, please do so!   [phone]

## 2016-06-30 ENCOUNTER — Telehealth: Payer: Self-pay

## 2016-06-30 NOTE — Telephone Encounter (Signed)
Please see below. Looks like you were working on this.

## 2016-06-30 NOTE — Telephone Encounter (Signed)
Pt's MRI has been rescheduled for 10/15 at 3:00 at [facility]. Needs to arrive at 2:30 and no food/drink 4 hours prior.

## 2016-07-02 ENCOUNTER — Ambulatory Visit: Payer: Managed Care, Other (non HMO)

## 2016-07-02 DIAGNOSIS — Z6828 Body mass index (BMI) 28.0-28.9, adult: Secondary | ICD-10-CM | POA: Diagnosis not present

## 2016-07-02 DIAGNOSIS — I10 Essential (primary) hypertension: Secondary | ICD-10-CM | POA: Diagnosis not present

## 2016-07-09 ENCOUNTER — Ambulatory Visit: Payer: Managed Care, Other (non HMO)

## 2016-07-15 ENCOUNTER — Other Ambulatory Visit: Payer: Self-pay | Admitting: Neurosurgery

## 2016-07-15 ENCOUNTER — Other Ambulatory Visit (HOSPITAL_COMMUNITY): Payer: Self-pay | Admitting: Neurosurgery

## 2016-07-15 DIAGNOSIS — G250 Essential tremor: Secondary | ICD-10-CM

## 2016-07-18 ENCOUNTER — Ambulatory Visit: Payer: Medicare Other | Admitting: Anesthesiology

## 2016-07-18 ENCOUNTER — Encounter
Admission: RE | Disposition: A | Discharge status: Home / Self Care | Payer: Self-pay | Source: Ambulatory Visit | Attending: Unknown Physician Specialty

## 2016-07-18 ENCOUNTER — Ambulatory Visit
Admission: RE | Admit: 2016-07-18 | Discharge: 2016-07-18 | Disposition: A | Discharge status: Home / Self Care | Payer: Medicare Other | Source: Ambulatory Visit | Attending: Unknown Physician Specialty | Admitting: Unknown Physician Specialty

## 2016-07-18 ENCOUNTER — Encounter: Payer: Self-pay | Admitting: *Deleted

## 2016-07-18 DIAGNOSIS — I10 Essential (primary) hypertension: Secondary | ICD-10-CM | POA: Diagnosis not present

## 2016-07-18 DIAGNOSIS — Z1211 Encounter for screening for malignant neoplasm of colon: Secondary | ICD-10-CM | POA: Diagnosis not present

## 2016-07-18 DIAGNOSIS — Z800 Family history of malignant neoplasm of digestive organs: Secondary | ICD-10-CM | POA: Insufficient documentation

## 2016-07-18 DIAGNOSIS — K529 Noninfective gastroenteritis and colitis, unspecified: Secondary | ICD-10-CM | POA: Diagnosis not present

## 2016-07-18 DIAGNOSIS — K640 First degree hemorrhoids: Secondary | ICD-10-CM | POA: Insufficient documentation

## 2016-07-18 DIAGNOSIS — K219 Gastro-esophageal reflux disease without esophagitis: Secondary | ICD-10-CM | POA: Diagnosis not present

## 2016-07-18 DIAGNOSIS — Z86010 Personal history of colonic polyps: Secondary | ICD-10-CM | POA: Diagnosis not present

## 2016-07-18 DIAGNOSIS — E559 Vitamin D deficiency, unspecified: Secondary | ICD-10-CM | POA: Diagnosis not present

## 2016-07-18 DIAGNOSIS — K648 Other hemorrhoids: Secondary | ICD-10-CM | POA: Diagnosis not present

## 2016-07-18 DIAGNOSIS — Z79899 Other long term (current) drug therapy: Secondary | ICD-10-CM | POA: Insufficient documentation

## 2016-07-18 DIAGNOSIS — Z87891 Personal history of nicotine dependence: Secondary | ICD-10-CM | POA: Diagnosis not present

## 2016-07-18 DIAGNOSIS — Z7982 Long term (current) use of aspirin: Secondary | ICD-10-CM | POA: Insufficient documentation

## 2016-07-18 DIAGNOSIS — K633 Ulcer of intestine: Secondary | ICD-10-CM | POA: Diagnosis not present

## 2016-07-18 DIAGNOSIS — K5289 Other specified noninfective gastroenteritis and colitis: Secondary | ICD-10-CM | POA: Diagnosis not present

## 2016-07-18 HISTORY — PX: COLONOSCOPY: SHX5424

## 2016-07-18 SURGERY — COLONOSCOPY
Anesthesia: General

## 2016-07-18 MED ORDER — SODIUM CHLORIDE 0.9 % IV SOLN
INTRAVENOUS | Status: DC
  Administered 2016-07-18: 10:00:00 via INTRAVENOUS

## 2016-07-18 MED ORDER — PROPOFOL 500 MG/50ML IV EMUL
INTRAVENOUS | Status: DC | PRN
  Administered 2016-07-18: 140 ug/kg/min via INTRAVENOUS

## 2016-07-18 MED ORDER — MIDAZOLAM HCL 5 MG/5ML IJ SOLN
INTRAMUSCULAR | Status: DC | PRN
  Administered 2016-07-18: 1 mg via INTRAVENOUS

## 2016-07-18 MED ORDER — PROPOFOL 10 MG/ML IV BOLUS
INTRAVENOUS | Status: DC | PRN
  Administered 2016-07-18: 100 mg via INTRAVENOUS

## 2016-07-18 MED ORDER — FENTANYL CITRATE (PF) 100 MCG/2ML IJ SOLN
INTRAMUSCULAR | Status: DC | PRN
  Administered 2016-07-18: 50 ug via INTRAVENOUS

## 2016-07-18 MED ORDER — PHENYLEPHRINE HCL 10 MG/ML IJ SOLN
INTRAMUSCULAR | Status: DC | PRN
  Administered 2016-07-18: 100 ug via INTRAVENOUS

## 2016-07-18 MED ORDER — EPHEDRINE SULFATE 50 MG/ML IJ SOLN
INTRAMUSCULAR | Status: DC | PRN
  Administered 2016-07-18: 10 mg via INTRAVENOUS

## 2016-07-18 MED ORDER — SODIUM CHLORIDE 0.9 % IV SOLN: INTRAVENOUS | Status: DC

## 2016-07-18 NOTE — Transfer of Care (Signed)
Immediate Anesthesia Transfer of Care Note  Patient: Carlos Hunt  Procedure(s) Performed: Procedure(s): COLONOSCOPY (N/A)  Patient Location: PACU and Endoscopy Unit  Anesthesia Type:General  Level of Consciousness: awake and patient cooperative  Airway & Oxygen Therapy: Patient Spontanous Breathing and Patient connected to nasal cannula oxygen  Post-op Assessment: Report given to RN and Post -op Vital signs reviewed and stable  Post vital signs: Reviewed and stable  Last Vitals:  Vitals:   07/18/16 0935  BP: (!) 137/53  Pulse: 81  Resp: 18  Temp: 36.4 C    Last Pain:  Vitals:   07/18/16 0935  TempSrc: Tympanic         Complications: No apparent anesthesia complications

## 2016-07-18 NOTE — H&P (Signed)
Primary Care Physician:  [name], MD Primary Gastroenterologist:  Dr. Vira Agar  Pre-Procedure History & Physical: HPI:  Carlos Hunt is a 70 y.o. male is here for an colonoscopy.   Past Medical History:  Diagnosis Date  . Blood in stool   . Erectile dysfunction   . GERD (gastroesophageal reflux disease)   . Hx of colonic polyps   . Hypertension   . Pain in left knee   . Pain, joint, shoulder   . Seasonal allergies   . Testicular hypofunction   . Tobacco use disorder   . Tremor   . Unspecified vitamin D deficiency     Past Surgical History:  Procedure Laterality Date  . COLONOSCOPY  09/09/2011   normal.  Previous polyps.  [name]. Repeat 5 years.  Marland Kitchen HAND SURGERY Left 2011   trauma    Prior to Admission medications   Medication Sig Start Date End Date Taking? Authorizing Provider  Cholecalciferol (HM VITAMIN D3) 2000 UNITS CAPS Take 2,000 Units by mouth.   Yes Historical Provider, MD  Cyanocobalamin (VITAMIN B12 PO) Take 3,000 mcg by mouth daily. Reported on 08/27/2015   Yes Historical Provider, MD  Echinacea 400 MG CAPS Take 400 mg by mouth daily.   Yes Historical Provider, MD  GLUCOSAMINE-CHONDROIT-MSM-C-MN PO Take by mouth.   Yes Historical Provider, MD  hydrochlorothiazide (HYDRODIURIL) 25 MG tablet Take 1 tablet (25 mg total) by mouth daily. 06/03/16  Yes Wardell Honour, MD  lisinopril (PRINIVIL,ZESTRIL) 10 MG tablet Take 1 tablet (10 mg total) by mouth daily. 06/03/16  Yes Wardell Honour, MD  Multiple Vitamins-Minerals (CENTRUM PO) Take by mouth.   Yes Historical Provider, MD  Omega-3 Fatty Acids (ULTRA OMEGA 3 PO) Take 690 mg by mouth 2 (two) times daily.    Yes Historical Provider, MD  primidone (MYSOLINE) 50 MG tablet Take 75 mg by mouth 2 (two) times daily. Reported on 08/27/2015   Yes Historical Provider, MD  aspirin 325 MG tablet Take 325 mg by mouth daily.    Historical Provider, MD  traMADol (ULTRAM) 50 MG tablet Take 1 tablet (50 mg total) by mouth every 6  (six) hours as needed for moderate pain. Patient not taking: Reported on 06/03/2016 12/17/15   Posey Boyer, MD    Allergies as of 05/26/2016 - Review Complete 05/20/2016  Allergen Reaction Noted  . Other Nausea And Vomiting 09/23/2012    Family History  Problem Relation Age of Onset  . Dementia Mother   . Parkinson's disease Father   . Cancer Father 32    prostate  . Heart disease Father 45    CAD/CABG  . Hypertension Father   . Hyperlipidemia Father   . Parkinsonism Father   . Parkinson's disease Brother   . Diabetes Sister     Social History   Social History  . Marital status: Married    Spouse name: N/A  . Number of children: 4  . Years of education: N/A   Occupational History  . building maintenance     [employer]   Social History Main Topics  . Smoking status: Former Smoker    Packs/day: 3.00    Years: 10.00    Types: Cigarettes    Quit date: 09/23/1970  . Smokeless tobacco: Never Used     Comment: quit in the 1970's  . Alcohol use 0.0 oz/week     Comment: occasional; once every 6 months  . Drug use: No  . Sexual activity: Yes  Other Topics Concern  . Not on file   Social History Narrative   Marital status: married x 45 years.      Children: 2 daughters, 2 sons; 6 grandchildren.      Lives: with wife.        Employment:  [employer] x 13 years; moderately happy.  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      Tobacco: quit 42 years ago; smoked x 10 years.       Alcohol: socially; weekends.  Rarely.      Drugs: none      Exercise:  None; work is physical.        Seatbelt: 100%      Sunscreen: rare sun exposure      Guns:  4 gun; unloaded, not stored in locked cabinet.       Smoke alarm and carbon monoxide detector in the home.      Caffeine use: consumes a minimal amount   Living Will: patient does NOT have Living Will; Does NOT have HCPOA; getting ready to work on it   Organ Donor: NO          Review of Systems: See HPI, otherwise  negative ROS  Physical Exam: BP (!) 137/53   Pulse 81   Temp 97.6 F (36.4 C) (Tympanic)   Resp 18   Ht 5' 11"  (1.803 m)   Wt 89.8 kg (198 lb)   SpO2 99%   BMI 27.62 kg/m  General:   Alert,  pleasant and cooperative in NAD Head:  Normocephalic and atraumatic. Neck:  Supple; no masses or thyromegaly. Lungs:  Clear throughout to auscultation.    Heart:  Regular rate and rhythm. Abdomen:  Soft, nontender and nondistended. Normal bowel sounds, without guarding, and without rebound.   Neurologic:  Alert and  oriented x4;  grossly normal neurologically.  Impression/Plan: Dennison Bulla is here for an colonoscopy to be performed for [facility] colon polyps and FH colon cancer in father  Risks, benefits, limitations, and alternatives regarding  colonoscopy have been reviewed with the patient.  Questions have been answered.  All parties agreeable.   Gaylyn Cheers, MD  07/18/2016, 10:00 AM

## 2016-07-18 NOTE — Op Note (Signed)
[facility] Gastroenterology Patient Name: Carlos Hunt Procedure Date: 07/18/2016 9:47 AM MRN: [id number] Account #: [id number] Date of Birth: 1946-01-04 Admit Type: Outpatient Age: 70 Room: [facility] ENDO ROOM 1 Gender: Male Note Status: Finalized Procedure:            Colonoscopy Indications:          Screening in patient at increased risk: Family history                        of 1st-degree relative with colorectal cancer, High                        risk colon cancer surveillance: Personal history of                        colonic polyps Providers:            Manya Silvas, MD Referring MD:         [name]. Tamala Julian, MD (Referring MD) Medicines:            Propofol per Anesthesia Complications:        No immediate complications. Procedure:            Pre-Anesthesia Assessment:                       - After reviewing the risks and benefits, the patient                        was deemed in satisfactory condition to undergo the                        procedure.                       After obtaining informed consent, the colonoscope was                        passed under direct vision. Throughout the procedure,                        the patient's blood pressure, pulse, and oxygen                        saturations were monitored continuously. The                        Colonoscope was introduced through the anus and                        advanced to the the cecum, identified by appendiceal                        orifice and ileocecal valve. The colonoscopy was                        performed without difficulty. The patient tolerated the                        procedure well. The quality of the bowel preparation  was excellent. Findings:      Localized mild inflammation characterized by friability and granularity       and a very Gavin superficial erosion was found at the ileocecal valve.       Biopsies were taken with a cold forceps for  histology.      A scattered and patchy area of mildly altered vascular mucosa was found       in the ascending colon. Biopsies were taken with a cold forceps for       histology.      Internal hemorrhoids were found during endoscopy. The hemorrhoids were       [place] and Grade I (internal hemorrhoids that do not prolapse).      The exam was otherwise without abnormality. Impression:           - Localized mild inflammation was found at the                        ileocecal valve. Biopsied.                       - Altered vascular mucosa in the ascending colon.                        Biopsied.                       - Internal hemorrhoids.                       - The examination was otherwise normal. Recommendation:       - Await pathology results. Possible NSAID effect. Manya Silvas, MD 07/18/2016 10:27:33 AM This report has been signed electronically. Number of Addenda: 0 Note Initiated On: 07/18/2016 9:47 AM Scope Withdrawal Time: 0 hours 10 minutes 10 seconds  Total Procedure Duration: 0 hours 14 minutes 10 seconds       [facility]

## 2016-07-18 NOTE — Anesthesia Postprocedure Evaluation (Signed)
Anesthesia Post Note  Patient: Allison A Rester  Procedure(s) Performed: Procedure(s) (LRB): COLONOSCOPY (N/A)  Patient location during evaluation: PACU Anesthesia Type: General Level of consciousness: awake and alert and oriented Pain management: pain level controlled Vital Signs Assessment: post-procedure vital signs reviewed and stable Respiratory status: spontaneous breathing, nonlabored ventilation and respiratory function stable Cardiovascular status: blood pressure returned to baseline and stable Postop Assessment: no signs of nausea or vomiting Anesthetic complications: no    Last Vitals:  Vitals:   07/18/16 1043 07/18/16 1053  BP: 123/60 130/64  Pulse: 74 63  Resp: 17 16  Temp:      Last Pain:  Vitals:   07/18/16 1033  TempSrc: Tympanic                 Tayvin Preslar

## 2016-07-18 NOTE — Anesthesia Preprocedure Evaluation (Signed)
Anesthesia Evaluation  Patient identified by MRN, date of birth, ID band Patient awake    Reviewed: Allergy & Precautions, NPO status , Patient's Chart, lab work & pertinent test results  History of Anesthesia Complications Negative for: history of anesthetic complications  Airway Mallampati: III  TM Distance: >3 FB Neck ROM: Full    Dental  (+) Teeth Intact   Pulmonary neg sleep apnea, neg COPD, former smoker,    breath sounds clear to auscultation- rhonchi (-) wheezing      Cardiovascular Exercise Tolerance: Good hypertension, Pt. on medications (-) CAD and (-) Past MI  Rhythm:Regular Rate:Normal - Systolic murmurs and - Diastolic murmurs    Neuro/Psych negative psych ROS   GI/Hepatic Neg liver ROS, GERD  ,  Endo/Other  negative endocrine ROSneg diabetes  Renal/GU negative Renal ROS     Musculoskeletal   Abdominal (+) - obese,   Peds  Hematology negative hematology ROS (+)   Anesthesia Other Findings Past Medical History: No date: Blood in stool No date: Erectile dysfunction No date: GERD (gastroesophageal reflux disease) No date: Hx of colonic polyps No date: Hypertension No date: Pain in left knee No date: Pain, joint, shoulder No date: Seasonal allergies No date: Testicular hypofunction No date: Tobacco use disorder No date: Tremor No date: Unspecified vitamin D deficiency   Reproductive/Obstetrics                             Anesthesia Physical Anesthesia Plan  ASA: II  Anesthesia Plan: General   Post-op Pain Management:    Induction: Intravenous  Airway Management Planned: Natural Airway  Additional Equipment:   Intra-op Plan:   Post-operative Plan:   Informed Consent: I have reviewed the patients History and Physical, chart, labs and discussed the procedure including the risks, benefits and alternatives for the proposed anesthesia with the patient or  authorized representative who has indicated his/her understanding and acceptance.   Dental advisory given  Plan Discussed with: CRNA and Anesthesiologist  Anesthesia Plan Comments:         Anesthesia Quick Evaluation

## 2016-07-21 ENCOUNTER — Encounter: Payer: Self-pay | Admitting: Unknown Physician Specialty

## 2016-07-22 LAB — SURGICAL PATHOLOGY

## 2016-08-21 ENCOUNTER — Telehealth: Payer: Self-pay

## 2016-08-21 NOTE — Telephone Encounter (Signed)
Patient would like to reschedule his MRI soon as possible. He would like something in the after noon or early morning. Please call!  [phone]

## 2016-08-22 NOTE — Telephone Encounter (Signed)
Called and spoke with patient. Gave him the [phone] number to reschedule his MRI. Patient stated he would reschedule ASAP.

## 2016-08-22 NOTE — Telephone Encounter (Signed)
Attempted to call patient but could not leave msg -- phone just kept ringing. The scheduling number that he is looking for is [phone]. They can schedule for all hospitals including [facility]. Please give pt this number if he calls back. Thanks!

## 2016-08-25 NOTE — Telephone Encounter (Signed)
Left msg with pts wife for him to call back with updated insurance info so that we can get authorization for his upcoming MRI. If he is unable to reach me/im not at my desk, please enter info into his chart so that I can begin the pre-auth process.  Thank you

## 2016-09-04 ENCOUNTER — Telehealth: Payer: Self-pay

## 2016-09-04 NOTE — Telephone Encounter (Signed)
Diane with [facility] in [place] is needing lab work of BUN and Creatine for the patient before the [employer] number [phone]

## 2016-09-05 ENCOUNTER — Other Ambulatory Visit
Admission: RE | Admit: 2016-09-05 | Discharge: 2016-09-05 | Disposition: A | Discharge status: Home / Self Care | Payer: Medicare Other | Source: Ambulatory Visit | Attending: Family Medicine | Admitting: Family Medicine

## 2016-09-05 ENCOUNTER — Ambulatory Visit
Admission: RE | Admit: 2016-09-05 | Discharge: 2016-09-05 | Disposition: A | Discharge status: Home / Self Care | Payer: Medicare Other | Source: Ambulatory Visit | Attending: Family Medicine | Admitting: Family Medicine

## 2016-09-05 DIAGNOSIS — K760 Fatty (change of) liver, not elsewhere classified: Secondary | ICD-10-CM | POA: Insufficient documentation

## 2016-09-05 DIAGNOSIS — Z029 Encounter for administrative examinations, unspecified: Secondary | ICD-10-CM | POA: Diagnosis present

## 2016-09-05 DIAGNOSIS — N281 Cyst of kidney, acquired: Secondary | ICD-10-CM | POA: Diagnosis not present

## 2016-09-05 DIAGNOSIS — N289 Disorder of kidney and ureter, unspecified: Secondary | ICD-10-CM | POA: Diagnosis not present

## 2016-09-05 DIAGNOSIS — D3002 Benign neoplasm of left kidney: Secondary | ICD-10-CM | POA: Diagnosis not present

## 2016-09-05 DIAGNOSIS — D1771 Benign lipomatous neoplasm of kidney: Secondary | ICD-10-CM

## 2016-09-05 LAB — CREATININE, SERUM
Creatinine, Ser: 1.1 mg/dL (ref 0.61–1.24)
GFR calc Af Amer: >60 mL/min (ref >60)
GFR calc non Af Amer: >60 mL/min (ref >60)

## 2016-09-05 LAB — BUN: BUN: 20 mg/dL (ref 6–20)

## 2016-09-05 MED ORDER — GADOBENATE DIMEGLUMINE 529 MG/ML IV SOLN
18.0000 mL | Freq: Once | INTRAVENOUS | Status: AC | PRN
  Administered 2016-09-05: 18 mL via INTRAVENOUS

## 2016-09-05 NOTE — Telephone Encounter (Signed)
These were done today in the hosp.

## 2016-09-12 ENCOUNTER — Encounter: Payer: Self-pay | Admitting: Family Medicine

## 2016-09-30 ENCOUNTER — Ambulatory Visit (INDEPENDENT_AMBULATORY_CARE_PROVIDER_SITE_OTHER): Payer: Medicare Other | Admitting: Neurology

## 2016-09-30 ENCOUNTER — Ambulatory Visit (HOSPITAL_COMMUNITY)
Admission: RE | Admit: 2016-09-30 | Discharge: 2016-09-30 | Disposition: A | Discharge status: Home / Self Care | Payer: Medicare Other | Source: Ambulatory Visit | Attending: Neurosurgery | Admitting: Neurosurgery

## 2016-09-30 ENCOUNTER — Encounter: Payer: Self-pay | Admitting: Neurology

## 2016-09-30 ENCOUNTER — Encounter (HOSPITAL_COMMUNITY)
Admission: RE | Disposition: A | Discharge status: Home / Self Care | Payer: Self-pay | Source: Ambulatory Visit | Attending: Neurosurgery

## 2016-09-30 ENCOUNTER — Ambulatory Visit (HOSPITAL_COMMUNITY): Payer: Medicare Other

## 2016-09-30 VITALS — BP 144/70 | HR 72 | Ht 71.0 in | Wt 210.2 lb

## 2016-09-30 DIAGNOSIS — Z79899 Other long term (current) drug therapy: Secondary | ICD-10-CM | POA: Insufficient documentation

## 2016-09-30 DIAGNOSIS — I10 Essential (primary) hypertension: Secondary | ICD-10-CM | POA: Insufficient documentation

## 2016-09-30 DIAGNOSIS — G250 Essential tremor: Secondary | ICD-10-CM | POA: Insufficient documentation

## 2016-09-30 DIAGNOSIS — R251 Tremor, unspecified: Secondary | ICD-10-CM | POA: Diagnosis not present

## 2016-09-30 DIAGNOSIS — Z87891 Personal history of nicotine dependence: Secondary | ICD-10-CM | POA: Insufficient documentation

## 2016-09-30 DIAGNOSIS — Z7982 Long term (current) use of aspirin: Secondary | ICD-10-CM | POA: Insufficient documentation

## 2016-09-30 SURGERY — MINOR PLACEMENT OF FIDUCIAL
Anesthesia: Choice

## 2016-09-30 MED ORDER — SODIUM BICARBONATE 4 % IV SOLN
INTRAVENOUS | Status: AC
  Filled 2016-09-30: qty 5

## 2016-09-30 MED ORDER — LIDOCAINE-EPINEPHRINE (PF) 2 %-1:200000 IJ SOLN
INTRAMUSCULAR | Status: AC
  Filled 2016-09-30: qty 20

## 2016-09-30 MED ORDER — BACITRACIN ZINC 500 UNIT/GM EX OINT
TOPICAL_OINTMENT | CUTANEOUS | Status: AC
  Filled 2016-09-30: qty 28.35

## 2016-09-30 SURGICAL SUPPLY — 16 items
BANDAGE ADH SHEER 1  50/CT (GAUZE/BANDAGES/DRESSINGS) ×15 IMPLANT
BLADE CLIPPER SURG (BLADE) ×3 IMPLANT
BLADE SURG 15 STRL LF DISP TIS (BLADE) ×1 IMPLANT
BLADE SURG 15 STRL SS (BLADE) ×2
COVER TABLE BACK 60X90 (DRAPES) ×3 IMPLANT
DRAPE HALF SHEET 40X57 (DRAPES) ×3 IMPLANT
DRAPE SHEET LG 3/4 BI-LAMINATE (DRAPES) ×3 IMPLANT
GLOVE BIO SURGEON STRL SZ8 (GLOVE) ×3 IMPLANT
GLOVE ECLIPSE 8.5 STRL (GLOVE) ×3 IMPLANT
NEEDLE HYPO 18GX1.5 BLUNT FILL (NEEDLE) ×3 IMPLANT
NEEDLE HYPO 25X1 1.5 SAFETY (NEEDLE) ×3 IMPLANT
SOLUTION BETADINE 4OZ (MISCELLANEOUS) ×3 IMPLANT
SPONGE GAUZE 4X4 12PLY STER LF (GAUZE/BANDAGES/DRESSINGS) ×3 IMPLANT
STAPLER SKIN PROX WIDE 3.9 (STAPLE) ×3 IMPLANT
SYR CONTROL 10ML LL (SYRINGE) ×3 IMPLANT
TOWEL OR 17X26 10 PK STRL BLUE (TOWEL DISPOSABLE) ×3 IMPLANT

## 2016-09-30 NOTE — Interval H&P Note (Signed)
History and Physical Interval Note:  09/30/2016 7:47 AM  Carlos Hunt  has presented today for surgery, with the diagnosis of ESSENTIAL TREMOR  The various methods of treatment have been discussed with the patient and family. After consideration of risks, benefits and other options for treatment, the patient has consented to  Procedure(s) with comments: MINOR PLACEMENT OF FIDUCIAL (N/A) - MINOR PLACEMENT OF FIDUCIAL as a surgical intervention .  The patient's history has been reviewed, patient examined, no change in status, stable for surgery.  I have reviewed the patient's chart and labs.  Questions were answered to the patient's satisfaction.     Bader Stubblefield D

## 2016-09-30 NOTE — H&P (Signed)
Patient ID:   [id number] Patient: Carlos Hunt  Date of Birth: 11-22-1945 Visit Type: Office Visit   Date: 07/02/2016 01:00 PM Provider: Marchia Meiers. Vertell Limber MD   This 71 year old male presents for Tremor.  History of Present Illness: 1.  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 Wells Guiles Tat's referral for discussion of deep brain stimulator for essential tremor. [Of note, patient's job was downsized to a part-time earlier this week.  He hopes to have a successful surgery, decreasing his tremor symptoms, and allowing him to resume full-time work elsewhere.]  History: HTN, tremor, GERD Surgical history: Left hand trauma 2011  Brain MRI on [facility] is normal        PAST MEDICAL/SURGICAL HISTORY   (Detailed)  Disease/disorder Onset Date Management Date Comments      [facility] 06/20/2016 -  Decreased vitamin D    [facility] 06/20/2016 -  GERD      Hand fracture    [facility] 06/20/2016 -  Hypertension      Tremor         Family History  (Detailed) Relationship Family Member Name Deceased Age at Death Condition Onset Age Cause of Death  Brother    Dementia  N  Brother    Parkinson's disease  N  Daughter    Tremor, essential  N    SOCIAL HISTORY  (Detailed) Tobacco use reviewed. Preferred language is Unknown.   Smoking status: Former smoker.  SMOKING STATUS Use Status Type Smoking Status Usage Per Day Years Used Total Pack Years  yes  Former smoker             MEDICATIONS(added, continued or stopped this visit): Started Medication Directions Instruction Stopped   aspirin  331m ORAL 1 by mouth daily     Glucosamine 500 mg tablet      hydrochlorothiazide 25 mg tablet take 1 tablet by oral route  every day     Intrinsi B12-Folate 500 mcg-20 mg-800 mcg tablet      lisinopril 10 mg tablet take 1 tablet by oral route  every day     primidone 50 mg tablet take 5 tablet by oral route 3 times every day     tramadol 50 mg  tablet take 1 tablet by oral route  every 6 hours as needed       ALLERGIES: Ingredient Reaction Medication Name Comment  PROCAINE HCL  Novocain       Vitals Date Temp F BP Pulse Ht In Wt Lb BMI BSA Pain Score  07/02/2016  151/77 74 71 207 28.87  0/10     PHYSICAL EXAM General Level of Distress: no acute distress Overall Appearance: normal  Head and Face  Right Left  Fundoscopic Exam:  normal normal    Cardiovascular Cardiac: regular rate and rhythm without murmur  Right Left  Carotid Pulses: normal normal  Respiratory Lungs: clear to auscultation  Neurological Orientation: normal Recent and Remote Memory: normal Attention Span and Concentration:   normal Language: normal Fund of Knowledge: normal  Right Left Sensation: normal normal Upper Extremity Coordination: normal normal  Lower Extremity Coordination: normal normal  Musculoskeletal Gait and Station: normal  Right Left Upper Extremity Muscle Strength: normal normal Lower Extremity Muscle Strength: normal normal Upper Extremity Muscle Tone:  normal normal Lower Extremity Muscle Tone: normal normal  Motor Strength Upper and lower extremity motor strength was tested in the clinically pertinent muscles.  Deep Tendon Reflexes  Right Left Biceps: normal normal Triceps: normal normal Brachiloradialis: normal normal Patellar: normal normal Achilles: normal normal  Cranial Nerves II. Optic Nerve/Visual Fields: normal III. Oculomotor: normal IV. Trochlear: normal V. Trigeminal: normal VI. Abducens: normal VII. Facial: normal VIII. Acoustic/Vestibular: normal IX. Glossopharyngeal: normal X. Vagus: normal XI. Spinal Accessory: normal XII. Hypoglossal: normal  Motor and other Tests Lhermittes: negative Rhomberg: negative Pronator drift: absent     Right Left Hoffman's: normal normal Clonus: normal normal Babinski: normal normal   Additional Findings:  No pronator drift, LE  strength is normal, symmetric reflexes   DIAGNOSTIC RESULTS Brain MRI on [facility] is normal     IMPRESSION The patient has a bilateral UE essential tremor that is worse on the right, but he states the left side is getting worse. He has extreme difficulty eating and drinking as well as writing his name. Therefore, he would like to proceed with bilateral deep brain stimulation for his essential tremor. I spent considerable time (>45 minutes) discussing the details of the surgery, examining the patient, going over educational materials and answering his and his wife's questions.  Completed Orders (this encounter) Order Details Reason Side Interpretation Result Initial Treatment Date Region  Lifestyle education follow up with primary physcian for elevated blood pressure.        Giving encouragement to exercise Encourage patient to eat a well balanced diet and follow up with primary physcian.         Assessment/Plan # Detail Type Description   1. Assessment Essential (primary) hypertension (I10).       2. Assessment Body mass index (BMI) 28.0-28.9, adult (E75.17).   Plan Orders Today's instructions / counseling include(s) Giving encouragement to exercise.         Pain Assessment/Treatment Pain Scale: 0/10. Method: Numeric Pain Intensity Scale.  Schedule DBS implantation. Nurse education given.   Orders: Instruction(s)/Education: Assessment Instruction  I10 Lifestyle education  [phone] Giving encouragement to exercise             Provider:  Marchia Meiers. Vertell Limber MD  07/02/2016 02:51 PM Dictation edited by: Johnella Moloney    CC Providers: Alonza Bogus [address] [place], [place] [id number]              Electronically signed by Marchia Meiers. Vertell Limber MD on 07/05/2016 01:43 PM

## 2016-09-30 NOTE — Progress Notes (Signed)
Carlos Hunt was seen today in the movement disorders clinic for neurologic consultation at the request of SMITH,KRISTI, MD.  I have reviewed numerous prior records made available to me.  This patient is accompanied in the office by his spouse who supplements the history.  The patient presents today for a second opinion regarding essential tremor.  He has been seeing [name] in [place] since 03/02/2014 and his last appointment with him was on November 05, 2015.  The patient reports that tremor began slowly, in approximately 2007.  Tremor started in both hands. Pt is R hand dominant but both hands shake. The patient notices tremor most when the hand is in use.  There is a family history of tremor in the patient's father, daughter, and brother, but his brother and father do have a known diagnosis of Parkinson's disease.  On his first visit with [name], the patient was started on primidone, 50 mg twice a day for essential tremor.  At his follow-up 2 months later, the patient reported that tremor was better, although he felt that the medication was causing significant dreams.  He continued on the medication until his February, 2017 follow-up and his primidone was increased to 125 mg twice a day.  Jaw tremor was noted that visit.  The patient called the neurology office a few weeks later because of anxiety and just "felt weird" and primidone was reduced to 75 mg twice a day.  04/24/16 update:  The patient was up today, accompanied by his wife who supplements the history.  Records were reviewed and are made available to me.  The patient had a dat scan at [facility] since our last visit.  There was mild asymmetry of update in caudates, less on the left than the right of uncertain etiology and could be from prominent perivascular space or from remote lacute but said not typical for PD or any parkinsonian state.  He had an MRI of the brain in 2016 that was unremarkable.  09/30/16 update:  Patient follows up today,  accompanied by his wife who supplements the history.  The patient had fiducials placed today in anticipation of DBS surgery for essential tremor.  Surgery is scheduled for one week from today, 10/07/2016.  The patient reports that he is ready for surgery.  He is currently on primidone, 50 mg bid.  Doesn't think that it is helping.  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    Neuroimaging has previously been performed.  It is available for my review today.  MRI was done 10/02/14 and was unremarkable.    ALLERGIES:   Allergies  Allergen Reactions  . Other Nausea And Vomiting    NOVACAINE  . Procaine Nausea And Vomiting    NOVOCAINE    CURRENT MEDICATIONS:  Outpatient Encounter Prescriptions as of 09/30/2016  Medication Sig  . aspirin 325 MG tablet Take 325 mg by mouth daily.  . Cholecalciferol (HM VITAMIN D3) 2000 UNITS CAPS Take 2,000 Units by mouth daily.   . Cyanocobalamin (VITAMIN B12 PO) Take 3,000 mcg by mouth daily. Reported on 08/27/2015  . Echinacea 400 MG CAPS Take 400 mg by mouth daily.  Marland Kitchen GLUCOSAMINE-CHONDROIT-MSM-C-MN PO Take 1 tablet by mouth daily.   . hydrochlorothiazide (HYDRODIURIL) 25 MG tablet Take 1 tablet (25 mg total) by mouth daily.  Marland Kitchen lisinopril (PRINIVIL,ZESTRIL) 10 MG tablet Take 1 tablet (10 mg total) by mouth daily.  . Multiple Vitamins-Minerals (CENTRUM PO) Take 1 tablet by mouth daily.   Marland Kitchen  Omega-3 Fatty Acids (ULTRA OMEGA 3 PO) Take 690 mg by mouth 2 (two) times daily.   . Papaya TABS Take 2 tablets by mouth daily as needed (heartburn).  . primidone (MYSOLINE) 50 MG tablet Take 50 mg by mouth 2 (two) times daily. Reported on 08/27/2015  . Tetrahydroz-Glyc-Hyprom-PEG (VISINE MAXIMUM REDNESS RELIEF OP) Apply 1 drop to eye daily as needed (redness).   No facility-administered encounter medications on file as of 09/30/2016.     PAST MEDICAL HISTORY:   Past Medical History:  Diagnosis Date  . Blood in stool   . Erectile dysfunction   . GERD  (gastroesophageal reflux disease)   . Hx of colonic polyps   . Hypertension   . Pain in left knee   . Pain, joint, shoulder   . Seasonal allergies   . Testicular hypofunction   . Tobacco use disorder   . Tremor   . Unspecified vitamin D deficiency     PAST SURGICAL HISTORY:   Past Surgical History:  Procedure Laterality Date  . COLONOSCOPY  09/09/2011   normal.  Previous polyps.  [name]. Repeat 5 years.  . COLONOSCOPY N/A 07/18/2016   Procedure: COLONOSCOPY;  Surgeon: Manya Silvas, MD;  Location: [facility] ENDOSCOPY;  Service: Endoscopy;  Laterality: N/A;  . HAND SURGERY Left 2011   trauma    SOCIAL HISTORY:   Social History   Social History  . Marital status: Married    Spouse name: N/A  . Number of children: 4  . Years of education: N/A   Occupational History  . building maintenance     [employer]   Social History Main Topics  . Smoking status: Former Smoker    Packs/day: 3.00    Years: 10.00    Types: Cigarettes    Quit date: 09/23/1970  . Smokeless tobacco: Never Used     Comment: quit in the 1970's  . Alcohol use 0.0 oz/week     Comment: occasional; once every 6 months  . Drug use: No  . Sexual activity: Yes   Other Topics Concern  . Not on file   Social History Narrative   Marital status: married x 45 years.      Children: 2 daughters, 2 sons; 6 grandchildren.      Lives: with wife.        Employment:  [employer] x 13 years; moderately happy.  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      Tobacco: quit 42 years ago; smoked x 10 years.       Alcohol: socially; weekends.  Rarely.      Drugs: none      Exercise:  None; work is physical.        Seatbelt: 100%      Sunscreen: rare sun exposure      Guns:  4 gun; unloaded, not stored in locked cabinet.       Smoke alarm and carbon monoxide detector in the home.      Caffeine use: consumes a minimal amount   Living Will: patient does NOT have Living Will; Does NOT have HCPOA; getting ready to  work on it   Organ Donor: NO          FAMILY HISTORY:   Family Status  Relation Status  . Mother Deceased at age 42   dementia  . Father Deceased at age 75   PD, heart disease  . Sister Alive   unknown  . Brother Deceased at age 24  Parkinson's disease  . Sister Alive   unknown  . Brother Alive   unknown  . Brother Alive   unknown  . Daughter Alive   tremor  . Son Alive   healthy    ROS:  A complete 10 system review of systems was obtained and was unremarkable apart from what is mentioned above.  PHYSICAL EXAMINATION:    VITALS:   Vitals:   09/30/16 1136  BP: (!) 144/70  Pulse: 72  SpO2: 97%  Weight: 210 lb 4 oz (95.4 kg)  Height: 5' 11"  (1.803 m)    GEN:  The patient appears stated age and is in NAD. HEENT:  Normocephalic, atraumatic.  The mucous membranes are moist. The superficial temporal arteries are without ropiness or tenderness. CV:  RRR Lungs:  CTAB Neck/HEME:  There are no carotid bruits bilaterally.  Neurological examination:  Orientation: The patient is alert and oriented x3. Fund of knowledge is appropriate.  Recent and remote memory are intact.  Attention and concentration are normal.    Able to name objects and repeat phrases. Cranial nerves: There is good facial symmetry.  The visual fields are full to confrontational testing. The speech is fluent and clear. Soft palate rises symmetrically and there is no tongue deviation. Hearing is intact to conversational tone. Sensation: Sensation is intact to light and pinprick throughout (facial, trunk, extremities). Vibration is intact at the bilateral big toe. There is no extinction with double simultaneous stimulation. There is no sensory dermatomal level identified. Motor: Strength is 5/5 in the bilateral upper and lower extremities.   Shoulder shrug is equal and symmetric.  There is no pronator drift.   Movement examination: Tone: There is normal tone in the bilateral upper extremities.  The tone in  the lower extremities is normal.  Abnormal movements: There is a resting tremor component present. There is a chin tremor.  There is postural tremor, worse in the wing beating position, R more than L.  He has significant trouble with archimedes spirals, R more than L.  Spills water all over when pouring from one glass to another.   Coordination:  There is  decremation with RAM's, only with toe taps on the L.  All other forms of RAM's are normal, with any form of RAMS, including alternating supination and pronation of the forearm, hand opening and closing, finger taps, heel taps bilaterally.  Gait and Station: The patient has no difficulty arising out of a deep-seated chair without the use of the hands. The patient's stride length is normal with normal stride length.  The patient has a negative pull test.      ASSESSMENT/PLAN:  1.  Tremor  -He likely has long standing ET that is now manifesting as a resting tremor.   -The patient had a dat scan at [facility] since our last visit.  There was mild asymmetry of update in caudates, less on the left than the right of uncertain etiology and could be from prominent perivascular space or from remote lacute but said not typical for PD or any parkinsonian state.  He had an MRI of the brain in 2016 that was unremarkable.  -He has found primidone to be ineffective for his tremor and he is unable to tolerate higher dosages.  He really is not a candidate for beta blocker therapy.  His pulse is only 53.  His age really is somewhat of a contraindication to starting him on a purely anticholinergic medication for tremor such as Artane or Cogentin.  Second line medication such as gabapentin or topiramate are likely not be helpful for his degree of tremor.  -I talked to the patient about the logistics associated with DBS therapy again today and any last minute questions were answered.  He had fiducials placed this morning and surgery is anticipated one week from today, on  10/07/2016.  -Talked about unilateral versus bilateral surgery and the risks and benefits of each.  Talked about 30% chance of loss of balance with bilateral implants.  He very much wants to have bilateral implants.  We will start L brain as he is R hand dominant and the R hand is somewhat worse than the L.    -pre-op video done today  -decrease primidone to 50 mg q day until Friday and then d/c 2.  .  Much greater than 50% of this visit was spent in counseling and coordinating care.  Total face to face time:  40 min

## 2016-09-30 NOTE — Patient Instructions (Signed)
1.  Drop primidone to one tablet once per day until Friday and then stop that

## 2016-10-06 ENCOUNTER — Encounter (HOSPITAL_COMMUNITY): Payer: Self-pay | Admitting: *Deleted

## 2016-10-06 MED ORDER — CEFAZOLIN SODIUM-DEXTROSE 2-4 GM/100ML-% IV SOLN
2.0000 g | INTRAVENOUS | Status: AC
  Administered 2016-10-07: 2 g via INTRAVENOUS
  Filled 2016-10-06: qty 100

## 2016-10-06 NOTE — Op Note (Signed)
Indications:  Patient has essential tremor and presents for Star Fix Fiducial Placement for upcoming DBS VIM placement.    Procedure:  Patient was brought to the operating room.  His scalp had been shaved.  Areas of planned fiducial placement were marked, scalp was prepped with Duraprep.  Scalp was infiltrated with lidocaine with epinephrine.  Four fiducials were placed according to standard landmarks through stab incisions.  4-0 Nylon sutures were placed and sterile dressings were applied.  Patient tolerated procedure well.  He was taken to recovery.

## 2016-10-06 NOTE — Progress Notes (Signed)
Pt denies SOB, chest pain, and being under the care of a cardiologist. Pt stated that he stopped taking Aspirin and Fish oil. Pt made aware to stop taking  Papaya, Glucosamine,  Echinacea and herbal medications. Do not take any NSAIDs ie: Ibuprofen, Advil, Naproxen, BC and Goody Powder or any medication containing Aspirin. Pt verbalized understanding of all pre-op instructions.

## 2016-10-06 NOTE — Progress Notes (Signed)
Pt denies having an echo and cardiac cath. Pt denies having a chest x ray within the last year.

## 2016-10-07 ENCOUNTER — Inpatient Hospital Stay (HOSPITAL_COMMUNITY): Payer: Medicare Other | Admitting: Anesthesiology

## 2016-10-07 ENCOUNTER — Inpatient Hospital Stay (HOSPITAL_COMMUNITY): Payer: Medicare Other

## 2016-10-07 ENCOUNTER — Inpatient Hospital Stay (HOSPITAL_COMMUNITY)
Admission: RE | Admit: 2016-10-07 | Discharge: 2016-10-08 | DRG: 027 | Disposition: A | Discharge status: Home / Self Care | Payer: Medicare Other | Source: Ambulatory Visit | Attending: Neurosurgery | Admitting: Neurosurgery

## 2016-10-07 ENCOUNTER — Encounter (HOSPITAL_COMMUNITY): Payer: Self-pay | Admitting: Surgery

## 2016-10-07 ENCOUNTER — Other Ambulatory Visit: Payer: Self-pay | Admitting: Neurology

## 2016-10-07 ENCOUNTER — Encounter (HOSPITAL_COMMUNITY)
Admission: RE | Disposition: A | Discharge status: Home / Self Care | Payer: Self-pay | Source: Ambulatory Visit | Attending: Neurosurgery

## 2016-10-07 DIAGNOSIS — Z7982 Long term (current) use of aspirin: Secondary | ICD-10-CM

## 2016-10-07 DIAGNOSIS — G250 Essential tremor: Secondary | ICD-10-CM | POA: Diagnosis not present

## 2016-10-07 DIAGNOSIS — Z79899 Other long term (current) drug therapy: Secondary | ICD-10-CM

## 2016-10-07 DIAGNOSIS — Z87891 Personal history of nicotine dependence: Secondary | ICD-10-CM

## 2016-10-07 DIAGNOSIS — I10 Essential (primary) hypertension: Secondary | ICD-10-CM | POA: Diagnosis present

## 2016-10-07 DIAGNOSIS — R251 Tremor, unspecified: Secondary | ICD-10-CM

## 2016-10-07 DIAGNOSIS — Z820 Family history of epilepsy and other diseases of the nervous system: Secondary | ICD-10-CM | POA: Diagnosis not present

## 2016-10-07 DIAGNOSIS — Z888 Allergy status to other drugs, medicaments and biological substances status: Secondary | ICD-10-CM | POA: Diagnosis not present

## 2016-10-07 DIAGNOSIS — K219 Gastro-esophageal reflux disease without esophagitis: Secondary | ICD-10-CM | POA: Diagnosis not present

## 2016-10-07 DIAGNOSIS — Z9889 Other specified postprocedural states: Secondary | ICD-10-CM

## 2016-10-07 DIAGNOSIS — Z462 Encounter for fitting and adjustment of other devices related to nervous system and special senses: Secondary | ICD-10-CM | POA: Diagnosis not present

## 2016-10-07 HISTORY — PX: SUBTHALAMIC STIMULATOR INSERTION: SHX5375

## 2016-10-07 HISTORY — DX: Essential tremor: G25.0

## 2016-10-07 LAB — BASIC METABOLIC PANEL
Anion gap: 11 (ref 5–15)
BUN: 19 mg/dL (ref 6–20)
CO2: 25 mmol/L (ref 22–32)
Calcium: 9.8 mg/dL (ref 8.9–10.3)
Chloride: 103 mmol/L (ref 101–111)
Creatinine, Ser: 1.17 mg/dL (ref 0.61–1.24)
GFR calc Af Amer: >60 mL/min (ref >60)
GFR calc non Af Amer: >60 mL/min (ref >60)
Glucose, Bld: 117 mg/dL — ABNORMAL HIGH (ref 65–99)
Potassium: 4 mmol/L (ref 3.5–5.1)
Sodium: 139 mmol/L (ref 135–145)

## 2016-10-07 LAB — CBC
HCT: 44.2 % (ref 39.0–52.0)
Hemoglobin: 15.6 g/dL (ref 13.0–17.0)
MCH: 32.2 pg (ref 26.0–34.0)
MCHC: 35.3 g/dL (ref 30.0–36.0)
MCV: 91.3 fL (ref 78.0–100.0)
Platelets: 251 10*3/uL (ref 150–400)
RBC: 4.84 MIL/uL (ref 4.22–5.81)
RDW: 13 % (ref 11.5–15.5)
WBC: 8.6 10*3/uL (ref 4.0–10.5)

## 2016-10-07 SURGERY — SUBTHALAMIC STIMULATOR INSERTION
Anesthesia: Monitor Anesthesia Care | Laterality: Bilateral

## 2016-10-07 MED ORDER — THROMBIN 5000 UNITS EX SOLR
CUTANEOUS | Status: DC | PRN
  Administered 2016-10-07 (×2): 5000 [IU] via TOPICAL

## 2016-10-07 MED ORDER — VITAMIN D 1000 UNITS PO TABS
2000.0000 [IU] | ORAL_TABLET | Freq: Every day | ORAL | Status: DC
  Administered 2016-10-07: 2000 [IU] via ORAL
  Filled 2016-10-07: qty 2

## 2016-10-07 MED ORDER — LIDOCAINE-EPINEPHRINE (PF) 2 %-1:200000 IJ SOLN
INTRAMUSCULAR | Status: AC
  Filled 2016-10-07: qty 20

## 2016-10-07 MED ORDER — PANTOPRAZOLE SODIUM 40 MG IV SOLR
40.0000 mg | Freq: Every day | INTRAVENOUS | Status: DC
  Administered 2016-10-07: 40 mg via INTRAVENOUS
  Filled 2016-10-07: qty 40

## 2016-10-07 MED ORDER — BUPIVACAINE HCL 0.5 % IJ SOLN
INTRAMUSCULAR | Status: DC | PRN
Start: 2016-10-07 — End: 2016-10-07
  Administered 2016-10-07: 18 mL
  Administered 2016-10-07: 4.5 mL

## 2016-10-07 MED ORDER — SODIUM CHLORIDE 0.9% FLUSH: 3.0000 mL | INTRAVENOUS | Status: DC | PRN

## 2016-10-07 MED ORDER — DEXMEDETOMIDINE HCL 200 MCG/2ML IV SOLN
INTRAVENOUS | Status: DC | PRN
  Administered 2016-10-07: 47.7 ug via INTRAVENOUS

## 2016-10-07 MED ORDER — VITAMIN B-12 1000 MCG PO TABS
3000.0000 ug | ORAL_TABLET | Freq: Every day | ORAL | Status: DC
  Administered 2016-10-07: 3000 ug via ORAL
  Filled 2016-10-07 (×2): qty 3

## 2016-10-07 MED ORDER — FLEET ENEMA 7-19 GM/118ML RE ENEM: 1.0000 | ENEMA | Freq: Once | RECTAL | Status: DC | PRN

## 2016-10-07 MED ORDER — KCL IN DEXTROSE-NACL 20-5-0.45 MEQ/L-%-% IV SOLN
INTRAVENOUS | Status: AC
  Administered 2016-10-07: 1000 mL
  Filled 2016-10-07: qty 1000

## 2016-10-07 MED ORDER — ACETAMINOPHEN 650 MG RE SUPP: 650.0000 mg | RECTAL | Status: DC | PRN

## 2016-10-07 MED ORDER — LACTATED RINGERS IV SOLN: INTRAVENOUS | Status: DC

## 2016-10-07 MED ORDER — SENNOSIDES-DOCUSATE SODIUM 8.6-50 MG PO TABS: 1.0000 | ORAL_TABLET | Freq: Every evening | ORAL | Status: DC | PRN

## 2016-10-07 MED ORDER — CEFAZOLIN SODIUM-DEXTROSE 2-4 GM/100ML-% IV SOLN
2.0000 g | Freq: Three times a day (TID) | INTRAVENOUS | Status: AC
  Administered 2016-10-07 (×2): 2 g via INTRAVENOUS
  Filled 2016-10-07 (×2): qty 100

## 2016-10-07 MED ORDER — HEMOSTATIC AGENTS (NO CHARGE) OPTIME
TOPICAL | Status: DC | PRN
  Administered 2016-10-07 (×2): 1 via TOPICAL

## 2016-10-07 MED ORDER — HYDROMORPHONE HCL 1 MG/ML IJ SOLN
INTRAMUSCULAR | Status: AC
  Filled 2016-10-07: qty 0.5

## 2016-10-07 MED ORDER — BUPIVACAINE HCL (PF) 0.5 % IJ SOLN
INTRAMUSCULAR | Status: AC
  Filled 2016-10-07: qty 30

## 2016-10-07 MED ORDER — THROMBIN 5000 UNITS EX SOLR
CUTANEOUS | Status: AC
  Filled 2016-10-07: qty 5000

## 2016-10-07 MED ORDER — PROMETHAZINE HCL 25 MG/ML IJ SOLN: 6.2500 mg | INTRAMUSCULAR | Status: DC | PRN

## 2016-10-07 MED ORDER — FENTANYL CITRATE (PF) 100 MCG/2ML IJ SOLN
INTRAMUSCULAR | Status: DC | PRN
  Administered 2016-10-07 (×3): 50 ug via INTRAVENOUS
  Administered 2016-10-07: 25 ug via INTRAVENOUS

## 2016-10-07 MED ORDER — LISINOPRIL 20 MG PO TABS
10.0000 mg | ORAL_TABLET | Freq: Every day | ORAL | Status: DC
  Administered 2016-10-07: 10 mg via ORAL
  Filled 2016-10-07: qty 1

## 2016-10-07 MED ORDER — SODIUM BICARBONATE 4 % IV SOLN
INTRAVENOUS | Status: AC
  Filled 2016-10-07: qty 5

## 2016-10-07 MED ORDER — FENTANYL CITRATE (PF) 100 MCG/2ML IJ SOLN
INTRAMUSCULAR | Status: AC
  Filled 2016-10-07: qty 2

## 2016-10-07 MED ORDER — HYDROMORPHONE HCL 1 MG/ML IJ SOLN
0.2500 mg | INTRAMUSCULAR | Status: DC | PRN
  Administered 2016-10-07: 0.5 mg via INTRAVENOUS

## 2016-10-07 MED ORDER — CHLORHEXIDINE GLUCONATE CLOTH 2 % EX PADS: 6.0000 | MEDICATED_PAD | Freq: Once | CUTANEOUS | Status: DC

## 2016-10-07 MED ORDER — SODIUM CHLORIDE 0.9 % IV SOLN: 250.0000 mL | INTRAVENOUS | Status: DC

## 2016-10-07 MED ORDER — ONDANSETRON HCL 4 MG/2ML IJ SOLN
INTRAMUSCULAR | Status: AC
  Filled 2016-10-07: qty 2

## 2016-10-07 MED ORDER — MIDAZOLAM HCL 2 MG/2ML IJ SOLN
INTRAMUSCULAR | Status: AC
  Filled 2016-10-07: qty 2

## 2016-10-07 MED ORDER — SODIUM BICARBONATE 4 % IV SOLN
INTRAVENOUS | Status: DC | PRN
  Administered 2016-10-07: 4 mL via SUBCUTANEOUS
  Administered 2016-10-07: 1 mL via SUBCUTANEOUS

## 2016-10-07 MED ORDER — THROMBIN 5000 UNITS EX SOLR
OROMUCOSAL | Status: DC | PRN
  Administered 2016-10-07: 09:00:00 via TOPICAL

## 2016-10-07 MED ORDER — ONDANSETRON HCL 4 MG/2ML IJ SOLN: 4.0000 mg | INTRAMUSCULAR | Status: DC | PRN

## 2016-10-07 MED ORDER — PHENOL 1.4 % MT LIQD: 1.0000 | OROMUCOSAL | Status: DC | PRN

## 2016-10-07 MED ORDER — HYDROCODONE-ACETAMINOPHEN 5-325 MG PO TABS
1.0000 | ORAL_TABLET | ORAL | Status: DC | PRN
  Administered 2016-10-07 – 2016-10-08 (×3): 2 via ORAL
  Filled 2016-10-07 (×3): qty 2

## 2016-10-07 MED ORDER — KCL IN DEXTROSE-NACL 20-5-0.45 MEQ/L-%-% IV SOLN: INTRAVENOUS | Status: DC

## 2016-10-07 MED ORDER — NAPHAZOLINE-GLYCERIN 0.012-0.2 % OP SOLN
Freq: Every day | OPHTHALMIC | Status: DC | PRN
  Filled 2016-10-07: qty 15

## 2016-10-07 MED ORDER — BACITRACIN ZINC 500 UNIT/GM EX OINT
TOPICAL_OINTMENT | CUTANEOUS | Status: AC
  Filled 2016-10-07: qty 28.35

## 2016-10-07 MED ORDER — ONDANSETRON HCL 4 MG/2ML IJ SOLN
INTRAMUSCULAR | Status: DC | PRN
  Administered 2016-10-07: 4 mg via INTRAVENOUS

## 2016-10-07 MED ORDER — BISACODYL 10 MG RE SUPP: 10.0000 mg | Freq: Every day | RECTAL | Status: DC | PRN

## 2016-10-07 MED ORDER — PROPOFOL 10 MG/ML IV BOLUS
INTRAVENOUS | Status: AC
  Filled 2016-10-07: qty 20

## 2016-10-07 MED ORDER — MORPHINE SULFATE (PF) 4 MG/ML IV SOLN: 1.0000 mg | INTRAVENOUS | Status: DC | PRN

## 2016-10-07 MED ORDER — PRIMIDONE 50 MG PO TABS
50.0000 mg | ORAL_TABLET | Freq: Two times a day (BID) | ORAL | Status: DC
  Administered 2016-10-07: 50 mg via ORAL
  Filled 2016-10-07 (×3): qty 1

## 2016-10-07 MED ORDER — DEXMEDETOMIDINE HCL IN NACL 200 MCG/50ML IV SOLN
INTRAVENOUS | Status: DC | PRN
  Administered 2016-10-07: .1 ug/kg/h via INTRAVENOUS

## 2016-10-07 MED ORDER — HYDROCHLOROTHIAZIDE 25 MG PO TABS
25.0000 mg | ORAL_TABLET | Freq: Every day | ORAL | Status: DC
  Administered 2016-10-07: 25 mg via ORAL
  Filled 2016-10-07: qty 1

## 2016-10-07 MED ORDER — DOCUSATE SODIUM 100 MG PO CAPS
100.0000 mg | ORAL_CAPSULE | Freq: Two times a day (BID) | ORAL | Status: DC
  Administered 2016-10-07: 100 mg via ORAL
  Filled 2016-10-07: qty 1

## 2016-10-07 MED ORDER — 0.9 % SODIUM CHLORIDE (POUR BTL) OPTIME
TOPICAL | Status: DC | PRN
  Administered 2016-10-07: 1000 mL

## 2016-10-07 MED ORDER — DEXMEDETOMIDINE HCL IN NACL 200 MCG/50ML IV SOLN
INTRAVENOUS | Status: AC
  Filled 2016-10-07: qty 50

## 2016-10-07 MED ORDER — MENTHOL 3 MG MT LOZG: 1.0000 | LOZENGE | OROMUCOSAL | Status: DC | PRN

## 2016-10-07 MED ORDER — LACTATED RINGERS IV SOLN
INTRAVENOUS | Status: DC | PRN
  Administered 2016-10-07: 07:00:00 via INTRAVENOUS

## 2016-10-07 MED ORDER — LIDOCAINE-EPINEPHRINE (PF) 2 %-1:200000 IJ SOLN
INTRAMUSCULAR | Status: DC | PRN
  Administered 2016-10-07: 18 mL via INTRADERMAL
  Administered 2016-10-07: 4.5 mL via INTRADERMAL

## 2016-10-07 MED ORDER — BACITRACIN ZINC 500 UNIT/GM EX OINT
TOPICAL_OINTMENT | CUTANEOUS | Status: DC | PRN
  Administered 2016-10-07: 1 via TOPICAL

## 2016-10-07 MED ORDER — ACETAMINOPHEN 325 MG PO TABS: 650.0000 mg | ORAL_TABLET | ORAL | Status: DC | PRN

## 2016-10-07 MED ORDER — SODIUM CHLORIDE 0.9% FLUSH
3.0000 mL | Freq: Two times a day (BID) | INTRAVENOUS | Status: DC
  Administered 2016-10-07: 3 mL via INTRAVENOUS

## 2016-10-07 MED ORDER — OXYCODONE-ACETAMINOPHEN 5-325 MG PO TABS: 1.0000 | ORAL_TABLET | ORAL | Status: DC | PRN

## 2016-10-07 MED ORDER — HEMOSTATIC AGENTS (NO CHARGE) OPTIME
TOPICAL | Status: DC | PRN
  Administered 2016-10-07: 1 via TOPICAL

## 2016-10-07 MED ORDER — LIDOCAINE 2% (20 MG/ML) 5 ML SYRINGE
INTRAMUSCULAR | Status: AC
  Filled 2016-10-07: qty 5

## 2016-10-07 MED ORDER — MEPERIDINE HCL 25 MG/ML IJ SOLN: 6.2500 mg | INTRAMUSCULAR | Status: DC | PRN

## 2016-10-07 SURGICAL SUPPLY — 73 items
BANDAGE ADH SHEER 1  50/CT (GAUZE/BANDAGES/DRESSINGS) ×12 IMPLANT
BIT DRILL NEURO 2X3.1 SFT TUCH (MISCELLANEOUS) ×1 IMPLANT
BLADE CLIPPER SURG (BLADE) ×3 IMPLANT
BLADE SURG 11 STRL SS (BLADE) ×9 IMPLANT
BNDG GAUZE ELAST 4 BULKY (GAUZE/BANDAGES/DRESSINGS) ×6 IMPLANT
BOOT SUTURE AID YELLOW STND (SUTURE) IMPLANT
CABLE MER (MISCELLANEOUS) ×3 IMPLANT
CANISTER SUCT 3000ML PPV (MISCELLANEOUS) ×3 IMPLANT
CARTRIDGE OIL MAESTRO DRILL (MISCELLANEOUS) ×1 IMPLANT
CLIP RANEY DISP (INSTRUMENTS) ×9 IMPLANT
CONT SPEC 4OZ CLIKSEAL STRL BL (MISCELLANEOUS) ×3 IMPLANT
DECANTER SPIKE VIAL GLASS SM (MISCELLANEOUS) ×6 IMPLANT
DIFFUSER DRILL AIR PNEUMATIC (MISCELLANEOUS) ×3 IMPLANT
DRAPE POUCH INSTRU U-SHP 10X18 (DRAPES) ×3 IMPLANT
DRAPE STERI IOBAN 125X83 (DRAPES) ×3 IMPLANT
DRILL NEURO 2X3.1 SOFT TOUCH (MISCELLANEOUS) ×3
DRSG OPSITE 4X5.5 SM (GAUZE/BANDAGES/DRESSINGS) ×9 IMPLANT
DRSG TELFA 3X8 NADH (GAUZE/BANDAGES/DRESSINGS) ×3 IMPLANT
DURAPREP 26ML APPLICATOR (WOUND CARE) ×6 IMPLANT
DURASEAL APPLICATOR TIP (TIP) ×6 IMPLANT
DURASEAL SPINE SEALANT 3ML (MISCELLANEOUS) ×6 IMPLANT
GAUZE SPONGE 4X4 12PLY STRL (GAUZE/BANDAGES/DRESSINGS) IMPLANT
GAUZE SPONGE 4X4 16PLY XRAY LF (GAUZE/BANDAGES/DRESSINGS) IMPLANT
GLOVE BIO SURGEON STRL SZ8 (GLOVE) ×3 IMPLANT
GLOVE BIOGEL PI IND STRL 8 (GLOVE) ×2 IMPLANT
GLOVE BIOGEL PI IND STRL 8.5 (GLOVE) ×2 IMPLANT
GLOVE BIOGEL PI INDICATOR 8 (GLOVE) ×4
GLOVE BIOGEL PI INDICATOR 8.5 (GLOVE) ×4
GLOVE ECLIPSE 8.0 STRL XLNG CF (GLOVE) ×6 IMPLANT
GLOVE EXAM NITRILE LRG STRL (GLOVE) IMPLANT
GLOVE EXAM NITRILE XL STR (GLOVE) IMPLANT
GLOVE EXAM NITRILE XS STR PU (GLOVE) IMPLANT
GOWN STRL REUS W/ TWL LRG LVL3 (GOWN DISPOSABLE) IMPLANT
GOWN STRL REUS W/ TWL XL LVL3 (GOWN DISPOSABLE) IMPLANT
GOWN STRL REUS W/TWL 2XL LVL3 (GOWN DISPOSABLE) IMPLANT
GOWN STRL REUS W/TWL LRG LVL3 (GOWN DISPOSABLE)
GOWN STRL REUS W/TWL XL LVL3 (GOWN DISPOSABLE)
HEMOSTAT POWDER KIT SURGIFOAM (HEMOSTASIS) ×3 IMPLANT
KIT BASIN OR (CUSTOM PROCEDURE TRAY) ×3 IMPLANT
KIT CATH ACCESSORY (MISCELLANEOUS) ×3 IMPLANT
KIT PLATFORM UNI 4LEG STARFIX (KITS) ×3 IMPLANT
KIT ROOM TURNOVER OR (KITS) ×3 IMPLANT
LEAD DBS (Neuro Prosthesis/Implant) ×6 IMPLANT
MARKER SKIN DUAL TIP RULER LAB (MISCELLANEOUS) ×6 IMPLANT
MEDTRONIC STRAP PAD KIT FOR HEAD REST ×3 IMPLANT
Medtronic Burr Hole Cover ×5 IMPLANT
NEEDLE HYPO 25X1 1.5 SAFETY (NEEDLE) ×6 IMPLANT
NEEDLE SPNL 18GX3.5 QUINCKE PK (NEEDLE) IMPLANT
NEEDLE SPNL 22GX3.5 QUINCKE BK (NEEDLE) IMPLANT
NS IRRIG 1000ML POUR BTL (IV SOLUTION) ×3 IMPLANT
OIL CARTRIDGE MAESTRO DRILL (MISCELLANEOUS) ×3
PACK LAMINECTOMY NEURO (CUSTOM PROCEDURE TRAY) ×3 IMPLANT
PAD ARMBOARD 7.5X6 YLW CONV (MISCELLANEOUS) ×15 IMPLANT
PERFORATOR LRG  14-11MM (BIT) ×2
PERFORATOR LRG 14-11MM (BIT) ×1 IMPLANT
PLATEFORM ARRAY DZAP LEADPOINT (MISCELLANEOUS) ×2
PLATFORM ARRAY DZAP LEADPOINT (MISCELLANEOUS) ×1 IMPLANT
PLATFORM BILATERAL KIT (MISCELLANEOUS) ×2 IMPLANT
PLATFORM BILATERAL SEQUENTIAL (MISCELLANEOUS) ×3 IMPLANT
SET SINGLE IT STRL (KITS) ×3 IMPLANT
SPONGE SURGIFOAM ABS GEL SZ50 (HEMOSTASIS) ×3 IMPLANT
STAPLER SKIN PROX WIDE 3.9 (STAPLE) ×6 IMPLANT
SUT ETHILON 3 0 PS 1 (SUTURE) IMPLANT
SUT SILK 2 0 TIES 10X30 (SUTURE) ×3 IMPLANT
SUT VIC AB 2-0 CP2 18 (SUTURE) ×6 IMPLANT
SUTURE REMOVAL KIT ×2 IMPLANT
SYR CONTROL 10ML LL (SYRINGE) ×3 IMPLANT
TOWEL OR 17X24 6PK STRL BLUE (TOWEL DISPOSABLE) ×3 IMPLANT
TOWEL OR 17X26 10 PK STRL BLUE (TOWEL DISPOSABLE) ×3 IMPLANT
TRAY FOLEY W/METER SILVER 16FR (SET/KITS/TRAYS/PACK) IMPLANT
TUBE CONNECTING 12'X1/4 (SUCTIONS) ×2
TUBE CONNECTING 12X1/4 (SUCTIONS) ×4 IMPLANT
WATER STERILE IRR 1000ML POUR (IV SOLUTION) ×3 IMPLANT

## 2016-10-07 NOTE — Interval H&P Note (Signed)
History and Physical Interval Note:  10/07/2016 7:40 AM  Carlos Hunt  has presented today for surgery, with the diagnosis of ESSENTIAL TREMOR  The various methods of treatment have been discussed with the patient and family. After consideration of risks, benefits and other options for treatment, the patient has consented to  Procedure(s) with comments: [place] (Bilateral) - [facility] as a surgical intervention .  The patient's history has been reviewed, patient examined, no change in status, stable for surgery.  I have reviewed the patient's chart and labs.  Questions were answered to the patient's satisfaction.     Ryhanna Dunsmore D

## 2016-10-07 NOTE — Transfer of Care (Signed)
Immediate Anesthesia Transfer of Care Note  Patient: Carlos Hunt  Procedure(s) Performed: Procedure(s) with comments: BILATERAL DEEP BRAIN STIMULATOR PLACEMENT (Bilateral) - BILATERAL DEEP BRAIN STIMULATOR PLACEMENT  Patient Location: PACU  Anesthesia Type:General  Level of Consciousness: awake, oriented and patient cooperative  Airway & Oxygen Therapy: Patient Spontanous Breathing and Patient connected to nasal cannula oxygen  Post-op Assessment: Report given to RN and Post -op Vital signs reviewed and stable  Post vital signs: Reviewed  Last Vitals:  Vitals:   10/07/16 0628 10/07/16 1209  BP: (!) 167/64 128/71  Pulse: (!) 57 (!) 51  Resp: 18 16  Temp: 36.7 C     Last Pain:  Vitals:   10/07/16 0628  TempSrc: Oral      Patients Stated Pain Goal: 3 (34/37/35 7897)  Complications: No apparent anesthesia complications

## 2016-10-07 NOTE — Op Note (Signed)
10/07/2016  11:50 AM  PATIENT:  Carlos Hunt  Carlos Hunt y.o. male  PRE-OPERATIVE DIAGNOSIS:  ESSENTIAL TREMOR  POST-OPERATIVE DIAGNOSIS:  ESSENTIAL TREMOR  PROCEDURE:  Procedure(s) with comments: BILATERAL DEEP BRAIN STIMULATOR PLACEMENT (Bilateral) - BILATERAL DEEP BRAIN STIMULATOR PLACEMENT VIM nucleus with microelectrode recordings  SURGEON:  Surgeon(s) and Role:    * Erline Levine, MD - Primary  PHYSICIAN ASSISTANT:   ASSISTANTS: [place], RN   ANESTHESIA:   IV sedation  EBL:  Total I/O In: 800 [I.V.:800] Out: 10 [Blood:10]  BLOOD ADMINISTERED:none  DRAINS: none   LOCAL MEDICATIONS USED:  MARCAINE    and LIDOCAINE   SPECIMEN:  No Specimen  DISPOSITION OF SPECIMEN:  N/A  COUNTS:  YES  TOURNIQUET:  * No tourniquets in log *  DICTATION: DICTATION:   Indications: Patient is a Carlos Hunt year old man with Essential Tremor it was elected to take him to surgery for bilateral VIM Thalamus deep brain stimulator electrode placement  Procedure: Preoperative planing was performed with volumetricCT and placement of 4 fiducial markers in the skull followed by CT of the brain also obtained volumetrically. These were then exported to create Starfix head frame with planned targeting of VIM nucleus electrodes was performed. The patient was brought to the operating room and placed in a semi-Fowler's position with Carlos Hunt neck and stabilized in a neck holder. This was affixed to the [place] adapter. His scalp was then prepped with betadine scrub and DuraPrep and subsequently draped with an Ioban drape with the fiducials and the skin was infiltrated with local lidocaine.  The areas of planned incision were then infiltrated with local lidocaine with epinephrine. The stepoffs were connected and the Starfix frame was assembled.  The entry points were then marked.  2 curvilinear incisions were made centered on the bilateral entry points. An elevator was used to clear pericranium from the skull. The perforator it  was then used to produce two 14 mm bur holes. The dura was coagulated with bipolar electrocautery. Initially we operated on the left and subsequently on the right. After opening the dura and a localizing the entry point the stylette and outer cannula were inserted into the brain. Duraseal was placed to prevent CSF leakage at each entry site. Microelectrode recordings were then performed and  we had very good recordings from the VIM. Subsequently the stimulating electrode was placed and the patient had significant improvement in tremor on the right side of the body without significant side effects to higher voltages. The electrode was then locked into position with the stimlock cap. The redundant electrode was circularized and tunneled under the scalp. The assembly was reassembled on the right.  There was good microelectrode recordings for 6 mm of VIM and after placing the stimulating electrode the patient had excellent control of tremor on the left side of his body.  We therefore elected to place this electrode and locked into position and the stereotactic assembly was disassembled. The electrode was tunneled on the right and then into the posterior scalp and locked into position. The wounds were then irrigated and closed with 2-0 Vicryl sutures and staples. The fiducials were removed and staples were placed over each of these sites. The head was washed and then sterile occlusive dressings were placed. The patient was taken to recovery having tolerated Carlos Hunt procedure without difficulty or untoward effect. Please refer to detailed microelectrode recordings from Dr. Carles Collet for more specifics of the positioning of the electrodes. These are included in Carlos Hunt Epic note from the detailed neural  monitoring and physical exam assessment during the Carlos Hunt.  PLAN OF CARE: Admit to inpatient   PATIENT DISPOSITION:  PACU - hemodynamically stable.   Delay start of Pharmacological VTE agent (>24hrs) due to surgical blood loss or  risk of bleeding: yes

## 2016-10-07 NOTE — Brief Op Note (Signed)
10/07/2016  11:50 AM  PATIENT:  Carlos Hunt  71 y.o. male  PRE-OPERATIVE DIAGNOSIS:  ESSENTIAL TREMOR  POST-OPERATIVE DIAGNOSIS:  ESSENTIAL TREMOR  PROCEDURE:  Procedure(s) with comments: BILATERAL DEEP BRAIN STIMULATOR PLACEMENT (Bilateral) - BILATERAL DEEP BRAIN STIMULATOR PLACEMENT VIM nucleus with microelectrode recordings  SURGEON:  Surgeon(s) and Role:    * Erline Levine, MD - Primary  PHYSICIAN ASSISTANT:   ASSISTANTS: Poteat, RN   ANESTHESIA:   IV sedation  EBL:  Total I/O In: 800 [I.V.:800] Out: 10 [Blood:10]  BLOOD ADMINISTERED:none  DRAINS: none   LOCAL MEDICATIONS USED:  MARCAINE    and LIDOCAINE   SPECIMEN:  No Specimen  DISPOSITION OF SPECIMEN:  N/A  COUNTS:  YES  TOURNIQUET:  * No tourniquets in log *  DICTATION: DICTATION:   Indications: Patient is a 71 year old man with Essential Tremor it was elected to take him to surgery for bilateral VIM Thalamus deep brain stimulator electrode placement  Procedure: Preoperative planing was performed with volumetricCT and placement of 4 fiducial markers in the skull followed by CT of the brain also obtained volumetrically. These were then exported to create Starfix head frame with planned targeting of VIM nucleus electrodes was performed. The patient was brought to the operating room and placed in a semi-Fowler's position with her neck and stabilized in a neck holder. This was affixed to the [place] adapter. His scalp was then prepped with betadine scrub and DuraPrep and subsequently draped with an Ioban drape with the fiducials and the skin was infiltrated with local lidocaine.  The areas of planned incision were then infiltrated with local lidocaine with epinephrine. The stepoffs were connected and the Starfix frame was assembled.  The entry points were then marked.  2 curvilinear incisions were made centered on the bilateral entry points. An elevator was used to clear pericranium from the skull. The perforator it  was then used to produce two 14 mm bur holes. The dura was coagulated with bipolar electrocautery. Initially we operated on the left and subsequently on the right. After opening the dura and a localizing the entry point the stylette and outer cannula were inserted into the brain. Duraseal was placed to prevent CSF leakage at each entry site. Microelectrode recordings were then performed and  we had very good recordings from the VIM. Subsequently the stimulating electrode was placed and the patient had significant improvement in tremor on the right side of the body without significant side effects to higher voltages. The electrode was then locked into position with the stimlock cap. The redundant electrode was circularized and tunneled under the scalp. The assembly was reassembled on the right.  There was good microelectrode recordings for 6 mm of VIM and after placing the stimulating electrode the patient had excellent control of tremor on the left side of his body.  We therefore elected to place this electrode and locked into position and the stereotactic assembly was disassembled. The electrode was tunneled on the right and then into the posterior scalp and locked into position. The wounds were then irrigated and closed with 2-0 Vicryl sutures and staples. The fiducials were removed and staples were placed over each of these sites. The head was washed and then sterile occlusive dressings were placed. The patient was taken to recovery having tolerated her procedure without difficulty or untoward effect. Please refer to detailed microelectrode recordings from Dr. Carles Collet for more specifics of the positioning of the electrodes. These are included in her Epic note from the detailed neural  monitoring and physical exam assessment during the surgery.  PLAN OF CARE: Admit to inpatient   PATIENT DISPOSITION:  PACU - hemodynamically stable.   Delay start of Pharmacological VTE agent (>24hrs) due to surgical blood loss or  risk of bleeding: yes

## 2016-10-07 NOTE — H&P (View-Only) (Signed)
Patient ID:   [id number] Patient: Carlos Hunt  Date of Birth: 1946-07-29 Visit Type: Office Visit   Date: 07/02/2016 01:00 PM Provider: Marchia Meiers. Vertell Limber MD   This 71 year old male presents for Tremor.  History of Present Illness: 1.  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 Wells Guiles Tat's referral for discussion of deep brain stimulator for essential tremor. [Of note, patient's job was downsized to a part-time earlier this week.  He hopes to have a successful surgery, decreasing his tremor symptoms, and allowing him to resume full-time work elsewhere.]  History: HTN, tremor, GERD Surgical history: Left hand trauma 2011  Brain MRI on [facility] is normal        PAST MEDICAL/SURGICAL HISTORY   (Detailed)  Disease/disorder Onset Date Management Date Comments      [facility] 06/20/2016 -  Decreased vitamin D    [facility] 06/20/2016 -  GERD      Hand fracture    [facility] 06/20/2016 -  Hypertension      Tremor         Family History  (Detailed) Relationship Family Member Name Deceased Age at Death Condition Onset Age Cause of Death  Brother    Dementia  N  Brother    Parkinson's disease  N  Daughter    Tremor, essential  N    SOCIAL HISTORY  (Detailed) Tobacco use reviewed. Preferred language is Unknown.   Smoking status: Former smoker.  SMOKING STATUS Use Status Type Smoking Status Usage Per Day Years Used Total Pack Years  yes  Former smoker             MEDICATIONS(added, continued or stopped this visit): Started Medication Directions Instruction Stopped   aspirin  317m ORAL 1 by mouth daily     Glucosamine 500 mg tablet      hydrochlorothiazide 25 mg tablet take 1 tablet by oral route  every day     Intrinsi B12-Folate 500 mcg-20 mg-800 mcg tablet      lisinopril 10 mg tablet take 1 tablet by oral route  every day     primidone 50 mg tablet take 5 tablet by oral route 3 times every day     tramadol 50 mg  tablet take 1 tablet by oral route  every 6 hours as needed       ALLERGIES: Ingredient Reaction Medication Name Comment  PROCAINE HCL  Novocain       Vitals Date Temp F BP Pulse Ht In Wt Lb BMI BSA Pain Score  07/02/2016  151/77 74 71 207 28.87  0/10     PHYSICAL EXAM General Level of Distress: no acute distress Overall Appearance: normal  Head and Face  Right Left  Fundoscopic Exam:  normal normal    Cardiovascular Cardiac: regular rate and rhythm without murmur  Right Left  Carotid Pulses: normal normal  Respiratory Lungs: clear to auscultation  Neurological Orientation: normal Recent and Remote Memory: normal Attention Span and Concentration:   normal Language: normal Fund of Knowledge: normal  Right Left Sensation: normal normal Upper Extremity Coordination: normal normal  Lower Extremity Coordination: normal normal  Musculoskeletal Gait and Station: normal  Right Left Upper Extremity Muscle Strength: normal normal Lower Extremity Muscle Strength: normal normal Upper Extremity Muscle Tone:  normal normal Lower Extremity Muscle Tone: normal normal  Motor Strength Upper and lower extremity motor strength was tested in the clinically pertinent muscles.  Deep Tendon Reflexes  Right Left Biceps: normal normal Triceps: normal normal Brachiloradialis: normal normal Patellar: normal normal Achilles: normal normal  Cranial Nerves II. Optic Nerve/Visual Fields: normal III. Oculomotor: normal IV. Trochlear: normal V. Trigeminal: normal VI. Abducens: normal VII. Facial: normal VIII. Acoustic/Vestibular: normal IX. Glossopharyngeal: normal X. Vagus: normal XI. Spinal Accessory: normal XII. Hypoglossal: normal  Motor and other Tests Lhermittes: negative Rhomberg: negative Pronator drift: absent     Right Left Hoffman's: normal normal Clonus: normal normal Babinski: normal normal   Additional Findings:  No pronator drift, LE  strength is normal, symmetric reflexes   DIAGNOSTIC RESULTS Brain MRI on [facility] is normal     IMPRESSION The patient has a bilateral UE essential tremor that is worse on the right, but he states the left side is getting worse. He has extreme difficulty eating and drinking as well as writing his name. Therefore, he would like to proceed with bilateral deep brain stimulation for his essential tremor. I spent considerable time (>45 minutes) discussing the details of the surgery, examining the patient, going over educational materials and answering his and his wife's questions.  Completed Orders (this encounter) Order Details Reason Side Interpretation Result Initial Treatment Date Region  Lifestyle education follow up with primary physcian for elevated blood pressure.        Giving encouragement to exercise Encourage patient to eat a well balanced diet and follow up with primary physcian.         Assessment/Plan # Detail Type Description   1. Assessment Essential (primary) hypertension (I10).       2. Assessment Body mass index (BMI) 28.0-28.9, adult (X90.24).   Plan Orders Today's instructions / counseling include(s) Giving encouragement to exercise.         Pain Assessment/Treatment Pain Scale: 0/10. Method: Numeric Pain Intensity Scale.  Schedule DBS implantation. Nurse education given.   Orders: Instruction(s)/Education: Assessment Instruction  I10 Lifestyle education  [phone] Giving encouragement to exercise             Provider:  Marchia Meiers. Vertell Limber MD  07/02/2016 02:51 PM Dictation edited by: Johnella Moloney    CC Providers: Alonza Bogus [address] [place], [place] [address]              Electronically signed by Marchia Meiers. Vertell Limber MD on 07/05/2016 01:43 PM

## 2016-10-07 NOTE — Anesthesia Preprocedure Evaluation (Addendum)
Anesthesia Evaluation  Patient identified by MRN, date of birth, ID band Patient awake    Reviewed: Allergy & Precautions, NPO status , Patient's Chart, lab work & pertinent test results  History of Anesthesia Complications Negative for: history of anesthetic complications  Airway Mallampati: II  TM Distance: >3 FB Neck ROM: Full    Dental  (+) Teeth Intact, Dental Advisory Given   Pulmonary former smoker,    breath sounds clear to auscultation       Cardiovascular hypertension, Pt. on medications  Rhythm:Regular Rate:Normal     Neuro/Psych PSYCHIATRIC DISORDERS negative neurological ROS     GI/Hepatic Neg liver ROS, GERD  ,  Endo/Other  negative endocrine ROS  Renal/GU negative Renal ROS  negative genitourinary   Musculoskeletal negative musculoskeletal ROS (+)   Abdominal   Peds negative pediatric ROS (+)  Hematology negative hematology ROS (+)   Anesthesia Other Findings   Reproductive/Obstetrics negative OB ROS                           Anesthesia Physical Anesthesia Plan  ASA: II  Anesthesia Plan: MAC   Post-op Pain Management:    Induction: Intravenous  Airway Management Planned: Natural Airway  Additional Equipment:   Intra-op Plan:   Post-operative Plan:   Informed Consent: I have reviewed the patients History and Physical, chart, labs and discussed the procedure including the risks, benefits and alternatives for the proposed anesthesia with the patient or authorized representative who has indicated his/her understanding and acceptance.     Plan Discussed with: CRNA  Anesthesia Plan Comments: (Ok with Precedex to start. )       Anesthesia Quick Evaluation

## 2016-10-07 NOTE — Anesthesia Postprocedure Evaluation (Addendum)
Anesthesia Post Note  Patient: Carlos Hunt  Procedure(s) Performed: Procedure(s) (LRB): BILATERAL DEEP BRAIN STIMULATOR PLACEMENT (Bilateral)  Patient location during evaluation: PACU Anesthesia Type: MAC Level of consciousness: awake and alert Pain management: pain level controlled Vital Signs Assessment: post-procedure vital signs reviewed and stable Respiratory status: spontaneous breathing, nonlabored ventilation, respiratory function stable and patient connected to nasal cannula oxygen Cardiovascular status: blood pressure returned to baseline and stable Postop Assessment: no signs of nausea or vomiting Anesthetic complications: no       Last Vitals:  Vitals:   10/07/16 1245 10/07/16 1300  BP:    Pulse: (!) 47 (!) 48  Resp: 16 13  Temp:      Last Pain:  Vitals:   10/07/16 0628  TempSrc: Oral                 Effie Berkshire

## 2016-10-07 NOTE — Procedures (Signed)
Preoperative diagnosis:  Essential tremor Postoperative diagnosis:  same Surgeon:  Erline Levine Neurologist:  Wells Guiles Tat  CPT codes: [phone] (first hour) [phone] (for each additional hour) Indication for procedure: Determination of optimal electrode position for deep brain stimulation therapy. Complications: None   Description of procedure:  Following the incision and burr hole placement, a recording microelectrode was slowly advanced into the brain via a motorized drive.  Beginning approximately 10 mm above the target, recordings were periodically made, and the resultant brain activity was described on the neurophysiologic recording worksheet.  Relevant samples of the recordings were saved for subsequent off line analysis.  A total of one recording pass was obtained on the left side of the brain.    6.5 mm of VIM were recorded from the left side of the brain.  Following the recording, the recording electrode was replaced by a stimulating electrode by [name].  Test stimulations were then obtained.  Active contacts that were used and the response to stimulation, including both adverse effects and therapeutic effects, were recorded on the neurophysiologic stimulation worksheet.  In brief, there was complete resolution of  tremor following activation and stimulation.  Adverse effects only occurred at supratherapeutic voltages and consisted of paresthesias in the hand and arm.  A target of mirror image of the initial side was selected for the contralateral side and one recording pass was obtained on the opposite side of the brain.  6.5 mm of VIM were recorded from the right side of the brain.  Tremor cells and response to passive stimulation were noted.  The recording electrode was again replaced by a stimulating electrode and test stimulations were obtained.  Again, there was resolution of tremor but there was persistent lip/tongue paresthesias at low voltages.  The electrode was pulled back 1 mm  and tested again with good results and only transient hand/lip paresthesias.  Final electrode position was determined and the electrode was secured in place by [name] on each side.  Alonza Bogus, DO [facility] Director of Movement Disorders

## 2016-10-07 NOTE — Progress Notes (Signed)
Patient is awake, alert, conversant, MAEW with significant improvement in tremor on both sides.  He is still a bit nauseated, but this is improving.

## 2016-10-08 NOTE — Evaluation (Addendum)
Occupational Therapy Evaluation Patient Details Name: Carlos Hunt MRN: [id number] DOB: 1946/03/04 Today's Date: 10/08/2016    History of Present Illness 71 yo male s/p BILATERAL DEEP BRAIN STIMULATOR PLACEMENT   Past Medical History:  Diagnosis Date  . Blood in stool   . Erectile dysfunction   . Essential tremor   . GERD (gastroesophageal reflux disease)   . Hx of colonic polyps   . Hypertension   . Pain in left knee   . Pain, joint, shoulder   . Seasonal allergies   . Testicular hypofunction   . Tobacco use disorder   . Tremor   . Unspecified vitamin D deficiency       Clinical Impression   Patient evaluated by Occupational Therapy with no further acute OT needs identified. All education has been completed and the patient has no further questions. See below for any follow-up Occupational Therapy or equipment needs. OT to sign off. Thank you for referral.   No acute PT needs at this time. PT notified ability to screen out by OT     Follow Up Recommendations  No OT follow up    Equipment Recommendations  None recommended by OT    Recommendations for Other Services       Precautions / Restrictions Precautions Precautions: None      Mobility Bed Mobility               General bed mobility comments: in chair on arrival  Transfers Overall transfer level: Independent                    Balance                                            ADL Overall ADL's : Independent                                       General ADL Comments: dressed on arrival and able to don shoes. Educated on crossing leg verse bending forward into flexion. pt simulated tub transfer and completed 3 steps. pt has cats that have to be fed on second floor and wife plans to complete this task. so pt does not have to leave the first floor     Vision Vision Assessment?: No apparent visual deficits   Perception     Praxis       Pertinent Vitals/Pain Pain Assessment: Faces Faces Pain Scale: Hurts little more Pain Location: head Pain Descriptors / Indicators: Headache Pain Intervention(s): Monitored during session;Premedicated before session;Repositioned (head only with neck flexion / don shoes)     Hand Dominance Right   Extremity/Trunk Assessment Upper Extremity Assessment Upper Extremity Assessment: Overall WFL for tasks assessed   Lower Extremity Assessment Lower Extremity Assessment: Overall WFL for tasks assessed   Cervical / Trunk Assessment Cervical / Trunk Assessment: Normal   Communication Communication Communication: No difficulties   Cognition Arousal/Alertness: Awake/alert Behavior During Therapy: WFL for tasks assessed/performed Overall Cognitive Status: Within Functional Limits for tasks assessed                     General Comments       Exercises       Shoulder Instructions      Home Living Family/patient  expects to be discharged to:: Private residence Living Arrangements: Spouse/significant other Available Help at Discharge: Family Type of Home: House Home Access: Stairs to enter [employer] of Steps: 3 Entrance Stairs-Rails: None Home Layout: Two level;Able to live on main level with bedroom/bathroom     Bathroom Shower/Tub: Teacher, early years/pre: Standard     Home Equipment: None          Prior Functioning/Environment Level of Independence: Independent                 OT Problem List:     OT Treatment/Interventions:      OT Goals(Current goals can be found in the care plan section)    OT Frequency:     Barriers to D/C:            Co-evaluation              End of Session Nurse Communication: Mobility status;Precautions  Activity Tolerance: Patient tolerated treatment well Patient left: in chair;with call bell/phone within reach   Time: 0802-0815 OT Time Calculation (min): 13 min Charges:  OT  General Charges $OT Visit: 1 Procedure OT Evaluation $OT Eval Low Complexity: 1 Procedure G-Codes:    Peri Maris 11/06/2016, 8:29 AM  Jeri Modena   OTR/L Pager: [phone] Office: [phone] .

## 2016-10-08 NOTE — Discharge Summary (Signed)
Physician Discharge Summary  Patient ID: Carlos Hunt MRN: [id number] DOB/AGE: 01/21/1946 71 y.o.  Admit date: 10/07/2016 Discharge date: 10/08/2016  Admission [place]   Discharge Diagnoses: ESSENTIAL TREMOR s/p BILATERAL DEEP BRAIN STIMULATOR PLACEMENT (Bilateral) - BILATERAL DEEP BRAIN STIMULATOR PLACEMENT VIM nucleus with microelectrode recordings   Active Problems:   Essential tremor   Discharged Condition: good  Hospital Course: Carlos Hunt was admitted for surgery with dx Essential Tremor.  Placement of bilateral deep brain stimulator electrodes with good tremor control. Pt recovered nicely and transferred to [facility] for observation.   Consults: None  Significant Diagnostic Studies:   Treatments: surgery: BILATERAL DEEP BRAIN STIMULATOR PLACEMENT (Bilateral) - BILATERAL DEEP BRAIN STIMULATOR PLACEMENT VIM nucleus with microelectrode recordings    Discharge Exam: Blood pressure (!) 150/63, pulse (!) 55, temperature 97.8 F (36.6 C), temperature source Oral, resp. rate 20, weight 95.3 kg (210 lb), SpO2 97 %. Alert, conversant, sitting in chair. PEARL. Denies dizziness, noting only mild h/a overnight. Incisions stapled, covered with telfa &opsites. Minimal bloody drainage on drsgs. No erythema or swelling.     Disposition: 01-Home or Self Care  Pt verbalizes understanding of d/c instructions. Ok to remove drsgs tomorrow. Ok to shower. Staples will be removed at next surgery Feb 15th. Pt has sufficient Norco at home for prn use.      Allergies as of 10/08/2016      Reactions   Procaine Nausea And Vomiting   NOVOCAINE      Medication List    STOP taking these medications   aspirin 325 MG tablet     TAKE these medications   CENTRUM PO Take 1 tablet by mouth daily.   Echinacea 400 MG Caps Take 400 mg by mouth daily.   GLUCOSAMINE-CHONDROIT-MSM-C-MN PO Take 1 tablet by mouth daily.   HM VITAMIN D3 2000 units Caps Generic  drug:  Cholecalciferol Take 2,000 Units by mouth daily.   hydrochlorothiazide 25 MG tablet Commonly known as:  HYDRODIURIL Take 1 tablet (25 mg total) by mouth daily.   lisinopril 10 MG tablet Commonly known as:  PRINIVIL,ZESTRIL Take 1 tablet (10 mg total) by mouth daily.   Papaya Tabs Take 2 tablets by mouth daily as needed (heartburn).   primidone 50 MG tablet Commonly known as:  MYSOLINE Take 50 mg by mouth 2 (two) times daily. Reported on 08/27/2015   ULTRA OMEGA 3 PO Take 690 mg by mouth 2 (two) times daily.   VISINE MAXIMUM REDNESS RELIEF OP Apply 1 drop to eye daily as needed (redness).   VITAMIN B12 PO Take 3,000 mcg by mouth daily. Reported on 08/27/2015        Signed: Peggyann Shoals, MD 10/08/2016, 9:49 AM

## 2016-10-08 NOTE — Progress Notes (Signed)
Pt doing well. Pt and wife given D/C instructions with verbal understanding. Pt's incisions are covered with opsite with minimal amount of drainage. Pt D/C'd home via wheelchair @ 1100 per MD order. Pt is stable @ D/C and has no other needs at this time. Holli Humbles, RN

## 2016-10-08 NOTE — Progress Notes (Signed)
PT Cancellation Note  Patient Details Name: Carlos Hunt MRN: [id number] DOB: 06-08-1946   Cancelled Treatment:    Reason Eval/Treat Not Completed: PT screened, no needs identified, will sign off. OT addressed all mobility needs.   Lorriane Shire 10/08/2016, 8:27 AM

## 2016-10-08 NOTE — Progress Notes (Signed)
Subjective: Patient reports "I'm packed and ready to go home...just a little headache"  Objective: Vital signs in last 24 hours: Temp:  [97.7 F (36.5 C)-98.6 F (37 C)] 97.8 F (36.6 C) (01/31 0801) Pulse Rate:  [45-82] 55 (01/31 0801) Resp:  [12-20] 20 (01/31 0801) BP: (128-150)/(63-82) 150/63 (01/31 0801) SpO2:  [94 %-100 %] 97 % (01/31 0801)  Intake/Output from previous day: 01/30 0701 - 01/31 0700 In: 1140 [P.O.:240; I.V.:800; IV Piggyback:100] Out: 10 [Blood:10] Intake/Output this shift: No intake/output data recorded.  Alert, conversant, sitting in chair. PEARL. Denies dizziness, noting only mild h/a overnight. Incisions stapled, covered with telfa & opsites. Minimal bloody drainage on drsgs. No erythema or swelling.   Lab Results:  Recent Labs  10/07/16 0712  WBC 8.6  HGB 15.6  HCT 44.2  PLT 251   BMET  Recent Labs  10/07/16 0712  NA 139  K 4.0  CL 103  CO2 25  GLUCOSE 117*  BUN 19  CREATININE 1.17  CALCIUM 9.8    Studies/Results: Mr Brain Wo Contrast  Result Date: 10/07/2016 CLINICAL DATA:  Deep brain stimulator placement EXAM: MRI HEAD WITHOUT CONTRAST TECHNIQUE: Multiplanar, multiecho pulse sequences of the brain and surrounding structures were obtained without intravenous contrast. COMPARISON:  Head CT 09/30/2016 Brain MRI 05/23/2016 FINDINGS: Deep brain stimulator leads follow the expected course, terminating in the region of the subthalamic nuclei. T1 weighted imaging shows no evidence of acute hemorrhage. No midline shift or mass effect. No focal abnormality of the paranasal sinuses or skull base. Orbits are normal. IMPRESSION: Expected location of deep brain stimulator leads. Electronically Signed   By: Ulyses Jarred M.D.   On: 10/07/2016 15:01    Assessment/Plan: Doing well.  LOS: 1 day  Per DrStern, d/c to home. Pt verbalizes understanding of d/c instructions. Ok to remove drsgs tomorrow. Ok to shower. Staples will be removed at next surgery  Feb 15th. Pt has sufficient Norco at home for prn use.    Verdis Prime 10/08/2016, 8:09 AM

## 2016-10-13 ENCOUNTER — Encounter (HOSPITAL_COMMUNITY): Payer: Self-pay | Admitting: Neurosurgery

## 2016-10-16 ENCOUNTER — Telehealth: Payer: Self-pay

## 2016-10-16 NOTE — Telephone Encounter (Signed)
Tried to call patient two times to reschedule his appointment with Dr Tamala Julian on 12/02/16 but no one answered and there was no voicemail to leave a message on. I have mailed a letter on 10/16/16, if patient calls back please reschedule his appointment from 12/02/16 in Dr Thompson Caul next open day.  Thank you!

## 2016-10-21 ENCOUNTER — Encounter (HOSPITAL_COMMUNITY): Payer: Self-pay | Admitting: *Deleted

## 2016-10-21 NOTE — Progress Notes (Signed)
Pt denies SOB, chest pain, and being under the care of a cardiologist. Pt stated that he stopped taking Aspirin and Fish oil. Pt made aware to stop taking  Papaya, Glucosamine,  Echinacea and herbal medications. Do not take any NSAIDs ie: Ibuprofen, Advil, Naproxen, BC and Goody Powder or any medication containing Aspirin. Pt verbalized understanding of all pre-op instructions.

## 2016-10-23 ENCOUNTER — Ambulatory Visit (HOSPITAL_COMMUNITY): Payer: Medicare Other | Admitting: Critical Care Medicine

## 2016-10-23 ENCOUNTER — Inpatient Hospital Stay (HOSPITAL_COMMUNITY)
Admission: RE | Admit: 2016-10-23 | Discharge: 2016-10-24 | DRG: 042 | Disposition: A | Discharge status: Home / Self Care | Payer: Medicare Other | Source: Ambulatory Visit | Attending: Neurosurgery | Admitting: Neurosurgery

## 2016-10-23 ENCOUNTER — Encounter (HOSPITAL_COMMUNITY): Payer: Self-pay | Admitting: Urology

## 2016-10-23 ENCOUNTER — Encounter (HOSPITAL_COMMUNITY)
Admission: RE | Disposition: A | Discharge status: Home / Self Care | Payer: Self-pay | Source: Ambulatory Visit | Attending: Neurosurgery

## 2016-10-23 DIAGNOSIS — G250 Essential tremor: Principal | ICD-10-CM | POA: Diagnosis present

## 2016-10-23 DIAGNOSIS — R079 Chest pain, unspecified: Secondary | ICD-10-CM | POA: Diagnosis not present

## 2016-10-23 DIAGNOSIS — I10 Essential (primary) hypertension: Secondary | ICD-10-CM | POA: Diagnosis present

## 2016-10-23 DIAGNOSIS — R001 Bradycardia, unspecified: Secondary | ICD-10-CM | POA: Diagnosis not present

## 2016-10-23 DIAGNOSIS — K219 Gastro-esophageal reflux disease without esophagitis: Secondary | ICD-10-CM | POA: Diagnosis present

## 2016-10-23 DIAGNOSIS — Z79899 Other long term (current) drug therapy: Secondary | ICD-10-CM | POA: Diagnosis not present

## 2016-10-23 DIAGNOSIS — Z87891 Personal history of nicotine dependence: Secondary | ICD-10-CM

## 2016-10-23 HISTORY — PX: PULSE GENERATOR IMPLANT: SHX5370

## 2016-10-23 HISTORY — DX: Nausea with vomiting, unspecified: R11.2

## 2016-10-23 HISTORY — DX: Other specified postprocedural states: Z98.890

## 2016-10-23 LAB — CBC
HCT: 41 % (ref 39.0–52.0)
Hemoglobin: 14 g/dL (ref 13.0–17.0)
MCH: 31.5 pg (ref 26.0–34.0)
MCHC: 34.1 g/dL (ref 30.0–36.0)
MCV: 92.1 fL (ref 78.0–100.0)
Platelets: 292 10*3/uL (ref 150–400)
RBC: 4.45 MIL/uL (ref 4.22–5.81)
RDW: 13 % (ref 11.5–15.5)
WBC: 8.3 10*3/uL (ref 4.0–10.5)

## 2016-10-23 LAB — BASIC METABOLIC PANEL
Anion gap: 10 (ref 5–15)
BUN: 15 mg/dL (ref 6–20)
CO2: 25 mmol/L (ref 22–32)
Calcium: 9.3 mg/dL (ref 8.9–10.3)
Chloride: 105 mmol/L (ref 101–111)
Creatinine, Ser: 1.11 mg/dL (ref 0.61–1.24)
GFR calc Af Amer: >60 mL/min (ref >60)
GFR calc non Af Amer: >60 mL/min (ref >60)
Glucose, Bld: 110 mg/dL — ABNORMAL HIGH (ref 65–99)
Potassium: 3.8 mmol/L (ref 3.5–5.1)
Sodium: 140 mmol/L (ref 135–145)

## 2016-10-23 SURGERY — BILATERAL PULSE GENERATOR IMPLANT
Anesthesia: General | Site: Chest | Laterality: Bilateral

## 2016-10-23 MED ORDER — PANTOPRAZOLE SODIUM 40 MG IV SOLR: 40.0000 mg | Freq: Every day | INTRAVENOUS | Status: DC

## 2016-10-23 MED ORDER — FENTANYL CITRATE (PF) 100 MCG/2ML IJ SOLN
INTRAMUSCULAR | Status: AC
  Filled 2016-10-23: qty 2

## 2016-10-23 MED ORDER — VITAMIN B-12 1000 MCG PO TABS
3000.0000 ug | ORAL_TABLET | Freq: Every day | ORAL | Status: DC
  Administered 2016-10-23: 3000 ug via ORAL
  Filled 2016-10-23 (×2): qty 3

## 2016-10-23 MED ORDER — CEFAZOLIN SODIUM 1 G IJ SOLR
INTRAMUSCULAR | Status: DC | PRN
  Administered 2016-10-23: 2 g via INTRAMUSCULAR

## 2016-10-23 MED ORDER — LIDOCAINE 2% (20 MG/ML) 5 ML SYRINGE
INTRAMUSCULAR | Status: DC | PRN
  Administered 2016-10-23: 20 mg via INTRAVENOUS

## 2016-10-23 MED ORDER — MEPERIDINE HCL 25 MG/ML IJ SOLN: 6.2500 mg | INTRAMUSCULAR | Status: DC | PRN

## 2016-10-23 MED ORDER — PRIMIDONE 50 MG PO TABS: 50.0000 mg | ORAL_TABLET | Freq: Two times a day (BID) | ORAL | Status: DC

## 2016-10-23 MED ORDER — DEXAMETHASONE SODIUM PHOSPHATE 10 MG/ML IJ SOLN
INTRAMUSCULAR | Status: DC | PRN
  Administered 2016-10-23: 4 mg via INTRAVENOUS

## 2016-10-23 MED ORDER — MIDAZOLAM HCL 2 MG/2ML IJ SOLN: 0.5000 mg | Freq: Once | INTRAMUSCULAR | Status: DC | PRN

## 2016-10-23 MED ORDER — FLEET ENEMA 7-19 GM/118ML RE ENEM: 1.0000 | ENEMA | Freq: Once | RECTAL | Status: DC | PRN

## 2016-10-23 MED ORDER — 0.9 % SODIUM CHLORIDE (POUR BTL) OPTIME
TOPICAL | Status: DC | PRN
  Administered 2016-10-23: 1000 mL

## 2016-10-23 MED ORDER — FENTANYL CITRATE (PF) 100 MCG/2ML IJ SOLN
INTRAMUSCULAR | Status: DC | PRN
  Administered 2016-10-23: 50 ug via INTRAVENOUS
  Administered 2016-10-23: 100 ug via INTRAVENOUS
  Administered 2016-10-23 (×2): 50 ug via INTRAVENOUS

## 2016-10-23 MED ORDER — ROCURONIUM BROMIDE 50 MG/5ML IV SOSY
PREFILLED_SYRINGE | INTRAVENOUS | Status: DC | PRN
  Administered 2016-10-23: 20 mg via INTRAVENOUS
  Administered 2016-10-23 (×2): 10 mg via INTRAVENOUS
  Administered 2016-10-23: 50 mg via INTRAVENOUS

## 2016-10-23 MED ORDER — MIDAZOLAM HCL 5 MG/5ML IJ SOLN
INTRAMUSCULAR | Status: DC | PRN
  Administered 2016-10-23: 1 mg via INTRAVENOUS

## 2016-10-23 MED ORDER — PROPOFOL 10 MG/ML IV BOLUS
INTRAVENOUS | Status: DC | PRN
  Administered 2016-10-23: 120 mg via INTRAVENOUS
  Administered 2016-10-23: 20 mg via INTRAVENOUS

## 2016-10-23 MED ORDER — VITAMIN D 1000 UNITS PO TABS
2000.0000 [IU] | ORAL_TABLET | Freq: Every day | ORAL | Status: DC
  Administered 2016-10-23: 2000 [IU] via ORAL
  Filled 2016-10-23: qty 2

## 2016-10-23 MED ORDER — DOCUSATE SODIUM 100 MG PO CAPS
100.0000 mg | ORAL_CAPSULE | Freq: Two times a day (BID) | ORAL | Status: DC
  Administered 2016-10-23 (×2): 100 mg via ORAL
  Filled 2016-10-23 (×2): qty 1

## 2016-10-23 MED ORDER — PROPOFOL 10 MG/ML IV BOLUS
INTRAVENOUS | Status: AC
  Filled 2016-10-23: qty 20

## 2016-10-23 MED ORDER — LIDOCAINE-EPINEPHRINE (PF) 2 %-1:200000 IJ SOLN
INTRAMUSCULAR | Status: AC
  Filled 2016-10-23: qty 20

## 2016-10-23 MED ORDER — ONDANSETRON HCL 4 MG/2ML IJ SOLN
INTRAMUSCULAR | Status: AC
  Filled 2016-10-23: qty 2

## 2016-10-23 MED ORDER — ROCURONIUM BROMIDE 50 MG/5ML IV SOSY
PREFILLED_SYRINGE | INTRAVENOUS | Status: AC
  Filled 2016-10-23: qty 5

## 2016-10-23 MED ORDER — LACTATED RINGERS IV SOLN
INTRAVENOUS | Status: DC | PRN
  Administered 2016-10-23: 07:00:00 via INTRAVENOUS

## 2016-10-23 MED ORDER — MENTHOL 3 MG MT LOZG: 1.0000 | LOZENGE | OROMUCOSAL | Status: DC | PRN

## 2016-10-23 MED ORDER — POLYETHYLENE GLYCOL 3350 17 G PO PACK: 17.0000 g | PACK | Freq: Every day | ORAL | Status: DC | PRN

## 2016-10-23 MED ORDER — ARTIFICIAL TEARS OP OINT
TOPICAL_OINTMENT | OPHTHALMIC | Status: DC | PRN
  Administered 2016-10-23: 1 via OPHTHALMIC

## 2016-10-23 MED ORDER — VANCOMYCIN HCL 1000 MG IV SOLR
INTRAVENOUS | Status: DC | PRN
  Administered 2016-10-23: 1000 mg

## 2016-10-23 MED ORDER — POLYVINYL ALCOHOL 1.4 % OP SOLN
1.0000 [drp] | Freq: Every day | OPHTHALMIC | Status: DC | PRN
  Filled 2016-10-23: qty 15

## 2016-10-23 MED ORDER — PANTOPRAZOLE SODIUM 40 MG PO TBEC
40.0000 mg | DELAYED_RELEASE_TABLET | Freq: Every day | ORAL | Status: DC
  Administered 2016-10-23: 40 mg via ORAL
  Filled 2016-10-23: qty 1

## 2016-10-23 MED ORDER — FENTANYL CITRATE (PF) 100 MCG/2ML IJ SOLN: 25.0000 ug | INTRAMUSCULAR | Status: DC | PRN

## 2016-10-23 MED ORDER — LISINOPRIL 20 MG PO TABS
10.0000 mg | ORAL_TABLET | Freq: Every day | ORAL | Status: DC
  Administered 2016-10-23: 10 mg via ORAL
  Filled 2016-10-23: qty 1

## 2016-10-23 MED ORDER — BISACODYL 10 MG RE SUPP
10.0000 mg | Freq: Every day | RECTAL | Status: DC | PRN
Start: 2016-10-23 — End: 2016-10-24

## 2016-10-23 MED ORDER — OXYCODONE-ACETAMINOPHEN 5-325 MG PO TABS: 1.0000 | ORAL_TABLET | ORAL | Status: DC | PRN

## 2016-10-23 MED ORDER — PHENOL 1.4 % MT LIQD: 1.0000 | OROMUCOSAL | Status: DC | PRN

## 2016-10-23 MED ORDER — ONDANSETRON HCL 4 MG/2ML IJ SOLN
INTRAMUSCULAR | Status: DC | PRN
  Administered 2016-10-23: 4 mg via INTRAVENOUS

## 2016-10-23 MED ORDER — BACITRACIN ZINC 500 UNIT/GM EX OINT
TOPICAL_OINTMENT | CUTANEOUS | Status: AC
  Filled 2016-10-23: qty 28.35

## 2016-10-23 MED ORDER — SODIUM CHLORIDE 0.9% FLUSH: 3.0000 mL | INTRAVENOUS | Status: DC | PRN

## 2016-10-23 MED ORDER — CEFAZOLIN SODIUM-DEXTROSE 2-4 GM/100ML-% IV SOLN
2.0000 g | Freq: Three times a day (TID) | INTRAVENOUS | Status: AC
  Administered 2016-10-23 (×2): 2 g via INTRAVENOUS
  Filled 2016-10-23 (×2): qty 100

## 2016-10-23 MED ORDER — SODIUM CHLORIDE 0.9% FLUSH
3.0000 mL | Freq: Two times a day (BID) | INTRAVENOUS | Status: DC
  Administered 2016-10-23: 3 mL via INTRAVENOUS

## 2016-10-23 MED ORDER — KCL IN DEXTROSE-NACL 20-5-0.45 MEQ/L-%-% IV SOLN: INTRAVENOUS | Status: DC

## 2016-10-23 MED ORDER — ONDANSETRON HCL 4 MG/2ML IJ SOLN: 4.0000 mg | INTRAMUSCULAR | Status: DC | PRN

## 2016-10-23 MED ORDER — BUPIVACAINE HCL (PF) 0.5 % IJ SOLN
INTRAMUSCULAR | Status: DC | PRN
  Administered 2016-10-23: 16.5 mL

## 2016-10-23 MED ORDER — BACITRACIN ZINC 500 UNIT/GM EX OINT
TOPICAL_OINTMENT | CUTANEOUS | Status: DC | PRN
  Administered 2016-10-23 (×2): 1 via TOPICAL

## 2016-10-23 MED ORDER — MIDAZOLAM HCL 2 MG/2ML IJ SOLN
INTRAMUSCULAR | Status: AC
  Filled 2016-10-23: qty 2

## 2016-10-23 MED ORDER — HYDROCODONE-ACETAMINOPHEN 5-325 MG PO TABS: 1.0000 | ORAL_TABLET | ORAL | Status: DC | PRN

## 2016-10-23 MED ORDER — ADULT MULTIVITAMIN LIQUID CH
5.0000 mL | Freq: Every day | ORAL | Status: DC
  Administered 2016-10-23: 5 mL via ORAL
  Filled 2016-10-23 (×2): qty 15

## 2016-10-23 MED ORDER — SUGAMMADEX SODIUM 200 MG/2ML IV SOLN
INTRAVENOUS | Status: DC | PRN
  Administered 2016-10-23: 200 mg via INTRAVENOUS

## 2016-10-23 MED ORDER — LIDOCAINE 2% (20 MG/ML) 5 ML SYRINGE
INTRAMUSCULAR | Status: AC
  Filled 2016-10-23: qty 5

## 2016-10-23 MED ORDER — VANCOMYCIN HCL 1000 MG IV SOLR
INTRAVENOUS | Status: AC
  Filled 2016-10-23: qty 1000

## 2016-10-23 MED ORDER — PROMETHAZINE HCL 25 MG/ML IJ SOLN: 6.2500 mg | INTRAMUSCULAR | Status: DC | PRN

## 2016-10-23 MED ORDER — ALUM & MAG HYDROXIDE-SIMETH 200-200-20 MG/5ML PO SUSP: 30.0000 mL | Freq: Four times a day (QID) | ORAL | Status: DC | PRN

## 2016-10-23 MED ORDER — SUCCINYLCHOLINE CHLORIDE 200 MG/10ML IV SOSY
PREFILLED_SYRINGE | INTRAVENOUS | Status: AC
  Filled 2016-10-23: qty 10

## 2016-10-23 MED ORDER — LIDOCAINE-EPINEPHRINE (PF) 2 %-1:200000 IJ SOLN
INTRAMUSCULAR | Status: DC | PRN
  Administered 2016-10-23: 16.5 mL via INTRADERMAL

## 2016-10-23 MED ORDER — ACETAMINOPHEN 650 MG RE SUPP: 650.0000 mg | RECTAL | Status: DC | PRN

## 2016-10-23 MED ORDER — MORPHINE SULFATE (PF) 4 MG/ML IV SOLN
1.0000 mg | INTRAVENOUS | Status: DC | PRN
  Administered 2016-10-23: 4 mg via INTRAVENOUS
  Filled 2016-10-23: qty 1

## 2016-10-23 MED ORDER — SODIUM CHLORIDE 0.9 % IV SOLN: 250.0000 mL | INTRAVENOUS | Status: DC

## 2016-10-23 MED ORDER — TETRAHYDROZ-GLYC-HYPROM-PEG 0.05-0.2-0.36-1 % OP SOLN: Freq: Every day | OPHTHALMIC | Status: DC | PRN

## 2016-10-23 MED ORDER — LACTATED RINGERS IV SOLN
INTRAVENOUS | Status: DC | PRN
  Administered 2016-10-23: 08:00:00 via INTRAVENOUS

## 2016-10-23 MED ORDER — DEXAMETHASONE SODIUM PHOSPHATE 10 MG/ML IJ SOLN
INTRAMUSCULAR | Status: AC
  Filled 2016-10-23: qty 1

## 2016-10-23 MED ORDER — HYDROCHLOROTHIAZIDE 25 MG PO TABS
25.0000 mg | ORAL_TABLET | Freq: Every day | ORAL | Status: DC
  Administered 2016-10-23: 25 mg via ORAL
  Filled 2016-10-23: qty 1

## 2016-10-23 MED ORDER — ACETAMINOPHEN 325 MG PO TABS: 650.0000 mg | ORAL_TABLET | ORAL | Status: DC | PRN

## 2016-10-23 SURGICAL SUPPLY — 54 items
CANISTER SUCT 3000ML PPV (MISCELLANEOUS) ×3 IMPLANT
CARTRIDGE OIL MAESTRO DRILL (MISCELLANEOUS) IMPLANT
COIL EXT DBS STRETCH 40 (Lead) ×6 IMPLANT
CORDS BIPOLAR (ELECTRODE) ×6 IMPLANT
DECANTER SPIKE VIAL GLASS SM (MISCELLANEOUS) ×3 IMPLANT
DIFFUSER DRILL AIR PNEUMATIC (MISCELLANEOUS) IMPLANT
DRAPE CAMERA VIDEO/LASER (DRAPES) ×6 IMPLANT
DRAPE INCISE IOBAN 66X45 STRL (DRAPES) ×6 IMPLANT
DRAPE ORTHO SPLIT 77X108 STRL (DRAPES) ×8
DRAPE POUCH INSTRU U-SHP 10X18 (DRAPES) ×9 IMPLANT
DRAPE SURG ORHT 6 SPLT 77X108 (DRAPES) ×4 IMPLANT
DRSG OPSITE POSTOP 4X6 (GAUZE/BANDAGES/DRESSINGS) ×6 IMPLANT
DRSG TELFA 3X8 NADH (GAUZE/BANDAGES/DRESSINGS) ×6 IMPLANT
DURAPREP 26ML APPLICATOR (WOUND CARE) ×9 IMPLANT
ELECT CAUTERY BLADE 6.4 (BLADE) ×9 IMPLANT
ELECT REM PT RETURN 9FT ADLT (ELECTROSURGICAL) ×3
ELECTRODE REM PT RTRN 9FT ADLT (ELECTROSURGICAL) ×1 IMPLANT
GAUZE SPONGE 4X4 16PLY XRAY LF (GAUZE/BANDAGES/DRESSINGS) ×6 IMPLANT
GLOVE BIO SURGEON STRL SZ8 (GLOVE) ×12 IMPLANT
GLOVE BIOGEL PI IND STRL 8 (GLOVE) ×2 IMPLANT
GLOVE BIOGEL PI IND STRL 8.5 (GLOVE) ×4 IMPLANT
GLOVE BIOGEL PI INDICATOR 8 (GLOVE) ×4
GLOVE BIOGEL PI INDICATOR 8.5 (GLOVE) ×8
GLOVE ECLIPSE 8.0 STRL XLNG CF (GLOVE) IMPLANT
GLOVE EXAM NITRILE LRG STRL (GLOVE) IMPLANT
GLOVE EXAM NITRILE XL STR (GLOVE) IMPLANT
GLOVE EXAM NITRILE XS STR PU (GLOVE) IMPLANT
GOWN STRL REUS W/ TWL LRG LVL3 (GOWN DISPOSABLE) ×2 IMPLANT
GOWN STRL REUS W/ TWL XL LVL3 (GOWN DISPOSABLE) ×2 IMPLANT
GOWN STRL REUS W/TWL 2XL LVL3 (GOWN DISPOSABLE) ×6 IMPLANT
GOWN STRL REUS W/TWL LRG LVL3 (GOWN DISPOSABLE) ×4
GOWN STRL REUS W/TWL XL LVL3 (GOWN DISPOSABLE) ×4
KIT BASIN OR (CUSTOM PROCEDURE TRAY) ×3 IMPLANT
KIT ROOM TURNOVER OR (KITS) ×3 IMPLANT
NEEDLE HYPO 25X1 1.5 SAFETY (NEEDLE) ×3 IMPLANT
NEUROSTIM OCTOPOLAR ~~LOC~~ 60X55 (Neuro Prosthesis/Implant) ×6 IMPLANT
NS IRRIG 1000ML POUR BTL (IV SOLUTION) ×3 IMPLANT
OIL CARTRIDGE MAESTRO DRILL (MISCELLANEOUS)
PACK LAMINECTOMY NEURO (CUSTOM PROCEDURE TRAY) ×3 IMPLANT
PAD ARMBOARD 7.5X6 YLW CONV (MISCELLANEOUS) ×9 IMPLANT
PATIENT PROGRAMMER ×3 IMPLANT
PENCIL BUTTON HOLSTER BLD 10FT (ELECTRODE) ×6 IMPLANT
STAPLER SKIN PROX WIDE 3.9 (STAPLE) ×6 IMPLANT
SUT ETHILON 3 0 PS 1 (SUTURE) ×6 IMPLANT
SUT SILK 2 0 TIES 10X30 (SUTURE) ×3 IMPLANT
SUT VIC AB 2-0 CP2 18 (SUTURE) ×12 IMPLANT
SUT VIC AB 3-0 SH 8-18 (SUTURE) ×9 IMPLANT
TOOL TUNNELING (INSTRUMENTS) ×6 IMPLANT
TOWEL OR 17X24 6PK STRL BLUE (TOWEL DISPOSABLE) ×6 IMPLANT
TOWEL OR 17X26 10 PK STRL BLUE (TOWEL DISPOSABLE) ×6 IMPLANT
TUBE CONNECTING 12'X1/4 (SUCTIONS) ×1
TUBE CONNECTING 12X1/4 (SUCTIONS) ×2 IMPLANT
UNDERPAD 30X30 (UNDERPADS AND DIAPERS) ×3 IMPLANT
WATER STERILE IRR 1000ML POUR (IV SOLUTION) ×3 IMPLANT

## 2016-10-23 NOTE — Op Note (Signed)
10/23/2016  9:47 AM  PATIENT:  Carlos Hunt  71 y.o. male  PRE-OPERATIVE DIAGNOSIS:  ESSENTIAL TREMOR  POST-OPERATIVE DIAGNOSIS:  ESSENTIAL TREMOR  PROCEDURE:  Procedure(s) with comments: BILATERAL IMPLANTABLE PULSE GENERATOR (Bilateral) - BILATERAL IMPLANTABLE PULSE GENERATOR with extensions  SURGEON:  Surgeon(s) and Role:    * Erline Levine, MD - Primary  PHYSICIAN ASSISTANT:   ASSISTANTS: Poteat, RN   ANESTHESIA:   general  EBL:  Total I/O In: 1000 [I.V.:1000] Out: 50 [Blood:50]  BLOOD ADMINISTERED:none  DRAINS: none   LOCAL MEDICATIONS USED:  MARCAINE    and LIDOCAINE   SPECIMEN:  No Specimen  DISPOSITION OF SPECIMEN:  N/A  COUNTS:  YES  TOURNIQUET:  * No tourniquets in log *  DICTATION: DICTATION: Patient has implanted bilateral VIM stimulator electrodes having recently completed DBS Stage I and now presents for placement of lead extensions and IPG implantation.  PROCEDURE: Patient was brought to the operating room and GETA anesthesia was induced and patient was placed in supine position on horseshoe head holder.  Right upper chest, scalp, neck were prepped with betadine scrub and Duraprep after removing staples and shaving scalp.  Area of planned incision was infiltrated with lidocaine.  Scalp incision was made and the lead extensions were exposed. An incision was made in the right upper chest and a pocket was created.  Extension tunnel was made from scalp to pocket.  Carlos Hunt IPG was placed and attached to lead extension, which in turn were connected to cranial lead and torqued appropriately.  Covering boots was placed.  The IPG  was placed in the pocket.  Wounds were irrigated with vancomycin.  Incisions were closed with 2-0 Vicryl and 3-0 vicryl sutures at the pocket and 2-0 vicryl at the scalp with staples. and dressed with a sterile occlusive dressing.  The patient was then repositioned and the left sided lead and IPG were placed. Left upper chest, scalp, neck were  prepped with betadine scrub and Duraprep.  Area of planned incision was infiltrated with lidocaine.  Scalp incision was made and the lead extension was exposed. An incision was made in the left upper chest and a pocket was created.  Extension tunnel was made from scalp to pocket.  Carlos Hunt IPG was placed and attached to lead extension, which in turn were connected to cranial lead and torqued appropriately.  Covering boots was placed.  The IPG  was placed in the pocket.  Wounds were irrigated with vancomycin.  Incisions were closed with 2-0 Vicryl and 3-0 vicryl sutures at the pocket and 2-0 vicryl at the scalp with staples. and dressed with a sterile occlusive dressing. Counts were correct at the end of the case.   PLAN OF CARE: Admit for overnight observation  PATIENT DISPOSITION:  PACU - hemodynamically stable.   Delay start of Pharmacological VTE agent (>24hrs) due to surgical blood loss or risk of bleeding: yes

## 2016-10-23 NOTE — Brief Op Note (Signed)
10/23/2016  9:47 AM  PATIENT:  Carlos Hunt  71 y.o. male  PRE-OPERATIVE DIAGNOSIS:  ESSENTIAL TREMOR  POST-OPERATIVE DIAGNOSIS:  ESSENTIAL TREMOR  PROCEDURE:  Procedure(s) with comments: BILATERAL IMPLANTABLE PULSE GENERATOR (Bilateral) - BILATERAL IMPLANTABLE PULSE GENERATOR with extensions  SURGEON:  Surgeon(s) and Role:    * Erline Levine, MD - Primary  PHYSICIAN ASSISTANT:   ASSISTANTS: Poteat, RN   ANESTHESIA:   general  EBL:  Total I/O In: 1000 [I.V.:1000] Out: 50 [Blood:50]  BLOOD ADMINISTERED:none  DRAINS: none   LOCAL MEDICATIONS USED:  MARCAINE    and LIDOCAINE   SPECIMEN:  No Specimen  DISPOSITION OF SPECIMEN:  N/A  COUNTS:  YES  TOURNIQUET:  * No tourniquets in log *  DICTATION: DICTATION: Patient has implanted bilateral VIM stimulator electrodes having recently completed DBS Stage I and now presents for placement of lead extensions and IPG implantation.  PROCEDURE: Patient was brought to the operating room and GETA anesthesia was induced and patient was placed in supine position on horseshoe head holder.  Right upper chest, scalp, neck were prepped with betadine scrub and Duraprep after removing staples and shaving scalp.  Area of planned incision was infiltrated with lidocaine.  Scalp incision was made and the lead extensions were exposed. An incision was made in the right upper chest and a pocket was created.  Extension tunnel was made from scalp to pocket.  [place] IPG was placed and attached to lead extension, which in turn were connected to cranial lead and torqued appropriately.  Covering boots was placed.  The IPG  was placed in the pocket.  Wounds were irrigated with vancomycin.  Incisions were closed with 2-0 Vicryl and 3-0 vicryl sutures at the pocket and 2-0 vicryl at the scalp with staples. and dressed with a sterile occlusive dressing.  The patient was then repositioned and the left sided lead and IPG were placed. Left upper chest, scalp, neck were  prepped with betadine scrub and Duraprep.  Area of planned incision was infiltrated with lidocaine.  Scalp incision was made and the lead extension was exposed. An incision was made in the left upper chest and a pocket was created.  Extension tunnel was made from scalp to pocket.  [place] IPG was placed and attached to lead extension, which in turn were connected to cranial lead and torqued appropriately.  Covering boots was placed.  The IPG  was placed in the pocket.  Wounds were irrigated with vancomycin.  Incisions were closed with 2-0 Vicryl and 3-0 vicryl sutures at the pocket and 2-0 vicryl at the scalp with staples. and dressed with a sterile occlusive dressing. Counts were correct at the end of the case.   PLAN OF CARE: Admit for overnight observation  PATIENT DISPOSITION:  PACU - hemodynamically stable.   Delay start of Pharmacological VTE agent (>24hrs) due to surgical blood loss or risk of bleeding: yes

## 2016-10-23 NOTE — Transfer of Care (Signed)
Immediate Anesthesia Transfer of Care Note  Patient: Carlos Hunt  Procedure(s) Performed: Procedure(s) with comments: BILATERAL IMPLANTABLE PULSE GENERATOR (Bilateral) - BILATERAL IMPLANTABLE PULSE GENERATOR  Patient Location: PACU  Anesthesia Type:General  Level of Consciousness: awake, alert  and oriented  Airway & Oxygen Therapy: Patient Spontanous Breathing and Patient connected to nasal cannula oxygen  Post-op Assessment: Report given to RN, Post -op Vital signs reviewed and stable and Patient moving all extremities X 4  Post vital signs: Reviewed and stable  Last Vitals:  Vitals:   10/23/16 0600 10/23/16 0606  BP: (!) 189/79 (!) 164/64  Pulse: 63   Resp: 20   Temp: 36.6 C     Last Pain:  Vitals:   10/23/16 0600  TempSrc: Oral      Patients Stated Pain Goal: 3 (22/56/72 0919)  Complications: No apparent anesthesia complications

## 2016-10-23 NOTE — Interval H&P Note (Signed)
History and Physical Interval Note:  10/23/2016 7:35 AM  Carlos Hunt  has presented today for surgery, with the diagnosis of ESSENTIAL TREMOR  The various methods of treatment have been discussed with the patient and family. After consideration of risks, benefits and other options for treatment, the patient has consented to  Procedure(s) with comments: BILATERAL IMPLANTABLE PULSE GENERATOR (Bilateral) - [place] as a surgical intervention .  The patient's history has been reviewed, patient examined, no change in status, stable for surgery.  I have reviewed the patient's chart and labs.  Questions were answered to the patient's satisfaction.     [name]

## 2016-10-23 NOTE — H&P (View-Only) (Signed)
Patient is awake, alert, conversant, MAEW with significant improvement in tremor on both sides.  He is still a bit nauseated, but this is improving.

## 2016-10-23 NOTE — Anesthesia Preprocedure Evaluation (Addendum)
Anesthesia Evaluation  Patient identified by MRN, date of birth, ID band Patient awake    Reviewed: Allergy & Precautions, NPO status , Patient's Chart, lab work & pertinent test results  History of Anesthesia Complications (+) PONV  Airway Mallampati: II  TM Distance: >3 FB Neck ROM: Full    Dental  (+) Dental Advisory Given, Chipped   Pulmonary former smoker (quit 1972),    breath sounds clear to auscultation       Cardiovascular hypertension, Pt. on medications (-) angina Rhythm:Regular Rate:Normal  '16 Stress test: normal   Neuro/Psych Essential tremor    GI/Hepatic Neg liver ROS, GERD  Medicated and Controlled,  Endo/Other  negative endocrine ROS  Renal/GU negative Renal ROS     Musculoskeletal   Abdominal   Peds  Hematology negative hematology ROS (+)   Anesthesia Other Findings   Reproductive/Obstetrics                           Anesthesia Physical Anesthesia Plan  ASA: II  Anesthesia Plan: General   Post-op Pain Management:    Induction: Intravenous  Airway Management Planned: Oral ETT  Additional Equipment:   Intra-op Plan:   Post-operative Plan: Extubation in OR  Informed Consent: I have reviewed the patients History and Physical, chart, labs and discussed the procedure including the risks, benefits and alternatives for the proposed anesthesia with the patient or authorized representative who has indicated his/her understanding and acceptance.   Dental advisory given  Plan Discussed with: CRNA and Surgeon  Anesthesia Plan Comments: (Plan routine monitors, GETA)        Anesthesia Quick Evaluation

## 2016-10-23 NOTE — Anesthesia Procedure Notes (Signed)
Procedure Name: Intubation Date/Time: 10/23/2016 7:38 AM Performed by: Merrilyn Puma B Pre-anesthesia Checklist: Patient identified, Emergency Drugs available, Suction available, Patient being monitored and Timeout performed Patient Re-evaluated:Patient Re-evaluated prior to inductionOxygen Delivery Method: Circle system utilized Preoxygenation: Pre-oxygenation with 100% oxygen Intubation Type: IV induction Ventilation: Mask ventilation without difficulty Laryngoscope Size: Mac and 4 Grade View: Grade I Tube type: Oral Tube size: 7.5 mm Number of attempts: 1 Airway Equipment and Method: Stylet Placement Confirmation: ETT inserted through vocal cords under direct vision,  positive ETCO2,  CO2 detector and breath sounds checked- equal and bilateral Secured at: 22 cm Tube secured with: Tape Dental Injury: Teeth and Oropharynx as per pre-operative assessment

## 2016-10-23 NOTE — Anesthesia Postprocedure Evaluation (Signed)
Anesthesia Post Note  Patient: Carlos Hunt  Procedure(s) Performed: Procedure(s) (LRB): BILATERAL IMPLANTABLE PULSE GENERATOR (Bilateral)  Patient location during evaluation: PACU Anesthesia Type: General Level of consciousness: awake and alert, oriented and patient cooperative Pain management: pain level controlled Vital Signs Assessment: post-procedure vital signs reviewed and stable Respiratory status: spontaneous breathing, nonlabored ventilation and respiratory function stable Cardiovascular status: blood pressure returned to baseline and stable Postop Assessment: no signs of nausea or vomiting Anesthetic complications: no       Last Vitals:  Vitals:   10/23/16 1115 10/23/16 1157  BP: (!) 159/72 (!) 169/80  Pulse:    Resp:  18  Temp:  36.7 C    Last Pain:  Vitals:   10/23/16 1225  TempSrc:   PainSc: 10-Worst pain ever    LLE Motor Response: (P) Purposeful movement (10/23/16 1225) LLE Sensation: (P) Full sensation (10/23/16 1225) RLE Motor Response: (P) Purposeful movement (10/23/16 1225) RLE Sensation: (P) Full sensation (10/23/16 1225)      Kimberle Stanfill,E. Rankin Coolman

## 2016-10-24 ENCOUNTER — Encounter (HOSPITAL_COMMUNITY): Payer: Self-pay | Admitting: Neurosurgery

## 2016-10-24 MED ORDER — ADULT MULTIVITAMIN LIQUID CH
15.0000 mL | Freq: Every day | ORAL | Status: DC
  Filled 2016-10-24: qty 15

## 2016-10-24 NOTE — Evaluation (Signed)
Occupational Therapy Evaluation Patient Details Name: MOLLY MASELLI MRN: [id number] DOB: 1945/10/19 Today's Date: 10/24/2016    History of Present Illness 71 yo male admitted with BILATERAL IMPLANTABLE PULSE GENERATOR    Clinical Impression   Patient evaluated by Occupational Therapy with no further acute OT needs identified. All education has been completed and the patient has no further questions. See below for any follow-up Occupational Therapy or equipment needs. OT to sign off. Thank you for referral.  Ot notified PT of evaluation and pt ambulating mod I     Follow Up Recommendations  No OT follow up    Equipment Recommendations  None recommended by OT    Recommendations for Other Services       Precautions / Restrictions Precautions Precautions: None      Mobility Bed Mobility               General bed mobility comments: in chair on arrival  Transfers Overall transfer level: Independent                    Balance                                            ADL Overall ADL's : Independent                                       General ADL Comments: dressed on arrival and up in the chair demonstrates 3 steps and ability to cross tub surface. pt will have family (A) upon d/c home. pt without further questions     Vision Additional Comments: pt reports having bifocals but doesnt wear them unless print is very tiny   Perception     Praxis      Pertinent Vitals/Pain Pain Assessment: Faces Faces Pain Scale: Hurts little more Pain Location: head Pain Descriptors / Indicators: Headache Pain Intervention(s): Monitored during session;Premedicated before session;Repositioned     Hand Dominance Right   Extremity/Trunk Assessment Upper Extremity Assessment Upper Extremity Assessment: Overall WFL for tasks assessed   Lower Extremity Assessment Lower Extremity Assessment: Overall WFL for tasks assessed    Cervical / Trunk Assessment Cervical / Trunk Assessment: Normal   Communication Communication Communication: No difficulties   Cognition Arousal/Alertness: Awake/alert Behavior During Therapy: WFL for tasks assessed/performed Overall Cognitive Status: Within Functional Limits for tasks assessed                     General Comments       Exercises       Shoulder Instructions      Home Living Family/patient expects to be discharged to:: Private residence Living Arrangements: Spouse/significant other Available Help at Discharge: Family Type of Home: House Home Access: Stairs to enter Technical brewer of Steps: 3 Entrance Stairs-Rails: None Home Layout: Two level;Able to live on main level with bedroom/bathroom     Bathroom Shower/Tub: Teacher, early years/pre: Standard     Home Equipment: None          Prior Functioning/Environment Level of Independence: Independent                 OT Problem List:     OT Treatment/Interventions:      OT Goals(Current goals can  be found in the care plan section)    OT Frequency:     Barriers to D/C:            Co-evaluation              End of Session Nurse Communication: Mobility status;Precautions  Activity Tolerance: Patient tolerated treatment well Patient left: in chair;with call bell/phone within reach   Time: 0722-0730 OT Time Calculation (min): 8 min Charges:  OT General Charges $OT Visit: 1 Procedure OT Evaluation $OT Eval Low Complexity: 1 Procedure G-Codes:    Parke Poisson B 10-29-2016, 7:32 AM   Jeri Modena   OTR/L Pager: [phone] Office: [phone] .'

## 2016-10-24 NOTE — Progress Notes (Signed)
PT Cancellation Note  Patient Details Name: DAIRON PROCTER MRN: [id number] DOB: Dec 17, 1945   Cancelled Treatment:    Reason Eval/Treat Not Completed: PT screened, no needs identified, will sign off. Discussed pt case with OT who states that pt does not require PT services at this time. Please reconsult if needs change.    Thelma Comp 10/24/2016, 9:14 AM   Rolinda Roan, PT, DPT Acute Rehabilitation Services Pager: [phone]

## 2016-10-24 NOTE — Progress Notes (Addendum)
Subjective: Patient reports "My head's a little sore, but I think I'm doing good"  Objective: Vital signs in last 24 hours: Temp:  [97.3 F (36.3 C)-98.3 F (36.8 C)] 98 F (36.7 C) (02/16 0351) Pulse Rate:  [67-78] 73 (02/16 0351) Resp:  [16-18] 18 (02/16 0351) BP: (118-169)/(62-87) 132/66 (02/16 0351) SpO2:  [94 %-99 %] 96 % (02/16 0351)  Intake/Output from previous day: 02/15 0701 - 02/16 0700 In: 1580 [P.O.:480; I.V.:1000; IV Piggyback:100] Out: 50 [Blood:50] Intake/Output this shift: No intake/output data recorded.  Alert, conversant, up in chair & dressed. MAEW. PEAR[place]. Incisions scalp & uppper chest bilaterally are without erythema, swelling, or drainage. Staples scalp. Dermabond & honeycomb chest.   Lab Results:  Recent Labs  10/23/16 0624  WBC 8.3  HGB 14.0  HCT 41.0  PLT 292   BMET  Recent Labs  10/23/16 0624  NA 140  K 3.8  CL 105  CO2 25  GLUCOSE 110*  BUN 15  CREATININE 1.11  CALCIUM 9.3    Studies/Results: No results found.  Assessment/Plan: Doing well  LOS: 1 day  Per DrStern, d/c to home. Pt has sufficient Norco at home for PRN use. Verbalizes understanding of d/c instructions. Ok to remove honeycomb drsgs tomorrow. Ok to leave staple incisions open to air. Ok to shower, washing scalp with hands to avoid disturbance of staples. He will call office to schedule 2 week staple removal with DrStern. He has appts with his primary MD & DrTat to start DBS therapy.   Verdis Prime 10/24/2016, 7:27 AM   Doing well.  Discharge home.

## 2016-10-24 NOTE — Progress Notes (Signed)
Pt doing well. Pt given D/C instructions with verbal understanding. Pt's incisions are clean and dry. Pt's IV was removed prior to D/C. Pt D/C'd home via walking @ 0915 per MD order. Pt is stable @ D/C and has no other needs at this time. Holli Humbles, RN

## 2016-10-24 NOTE — Discharge Summary (Signed)
       Physician Discharge Summary  Patient ID: Carlos SHAMOON MRN: [id number] DOB/AGE: 1946-06-07 71 y.o.  Admit date: 10/23/2016 Discharge date: 10/24/2016  Admission Diagnoses: ESSENTIAL TREMOR    Discharge Diagnoses: ESSENTIAL TREMOR s/p BILATERAL IMPLANTABLE PULSE GENERATOR (Bilateral) - BILATERAL IMPLANTABLE PULSE GENERATOR with extensions     Active Problems:   Essential tremor   Discharged Condition: good  Hospital Course: Carlos Hunt was admitted for implantation of Implanted Pulse Generators bilaterally with extensions to Deep Brain Stimulator leads. Following uncomplicated surgery, he recovered nicely and stayed overnight for observation. He is mobilizing well.   Consults: None  Significant Diagnostic Studies:   Treatments: surgery: BILATERAL IMPLANTABLE PULSE GENERATOR (Bilateral) - BILATERAL IMPLANTABLE PULSE GENERATOR with extensions    Discharge Exam: Blood pressure 132/66, pulse 73, temperature 98 F (36.7 C), temperature source Oral, resp. rate 18, SpO2 96 %. Alert, conversant, up in chair &dressed. MAEW. PEAR[place]. Incisions scalp &uppper chest bilaterally are without erythema, swelling, or drainage. Staples scalp. Dermabond &honeycomb chest.       Disposition: 01-Home or Self Care  Pt has sufficient Norco at home for PRN use. Verbalizes understanding of d/c instructions. Ok to remove honeycomb drsgs tomorrow. Ok to leave staple incisions open to air. Ok to shower, washing scalp with hands to avoid disturbance of staples. He will call office to schedule 2 week staple removal with DrStern. He has appts with his primary MD & DrTat to start DBS therapy.       Allergies as of 10/24/2016      Reactions   Procaine Nausea And Vomiting   NOVOCAINE      Medication List    STOP taking these medications   HM VITAMIN D3 2000 units Caps Generic drug:  Cholecalciferol   primidone 50 MG tablet Commonly known as:  MYSOLINE      TAKE these medications   aspirin 325 MG tablet Take 325 mg by mouth daily.   CENTRUM PO Take 1 tablet by mouth daily.   Echinacea 400 MG Caps Take 400 mg by mouth daily.   GLUCOSAMINE-CHONDROIT-MSM-C-MN PO Take 1 tablet by mouth daily.   hydrochlorothiazide 25 MG tablet Commonly known as:  HYDRODIURIL Take 1 tablet (25 mg total) by mouth daily.   lisinopril 10 MG tablet Commonly known as:  PRINIVIL,ZESTRIL Take 1 tablet (10 mg total) by mouth daily.   Papaya Tabs Take 2 tablets by mouth daily as needed (heartburn).   ULTRA OMEGA 3 PO Take 690 mg by mouth 2 (two) times daily.   VISINE MAXIMUM REDNESS RELIEF OP Apply 1 drop to eye daily as needed (redness).   VITAMIN B12 PO Take 3,000 mcg by mouth daily. Reported on 08/27/2015        Signed: Peggyann Shoals, MD 10/24/2016, 8:19 AM

## 2016-11-05 NOTE — Progress Notes (Signed)
Carlos Hunt was seen today in the movement disorders clinic for neurologic consultation at the request of SMITH,KRISTI, MD.  I have reviewed numerous prior records made available to me.  This patient is accompanied in the office by his spouse who supplements the history.  The patient presents today for a second opinion regarding essential tremor.  He has been seeing [name] in [place] since 03/02/2014 and his last appointment with him was on November 05, 2015.  The patient reports that tremor began slowly, in approximately 2007.  Tremor started in both hands. Pt is R hand dominant but both hands shake. The patient notices tremor most when the hand is in use.  There is a family history of tremor in the patient's father, daughter, and brother, but his brother and father do have a known diagnosis of Parkinson's disease.  On his first visit with [name], the patient was started on primidone, 50 mg twice a day for essential tremor.  At his follow-up 2 months later, the patient reported that tremor was better, although he felt that the medication was causing significant dreams.  He continued on the medication until his February, 2017 follow-up and his primidone was increased to 125 mg twice a day.  Jaw tremor was noted that visit.  The patient called the neurology office a few weeks later because of anxiety and just "felt weird" and primidone was reduced to 75 mg twice a day.  04/24/16 update:  The patient was up today, accompanied by his wife who supplements the history.  Records were reviewed and are made available to me.  The patient had a dat scan at [facility] since our last visit.  There was mild asymmetry of update in caudates, less on the left than the right of uncertain etiology and could be from prominent perivascular space or from remote lacute but said not typical for PD or any parkinsonian state.  He had an MRI of the brain in 2016 that was unremarkable.  09/30/16 update:  Patient follows up today,  accompanied by his wife who supplements the history.  The patient had fiducials placed today in anticipation of DBS surgery for essential tremor.  Surgery is scheduled for one week from today, 10/07/2016.  The patient reports that he is ready for surgery.  He is currently on primidone, 50 mg bid.  Doesn't think that it is helping.  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    11/06/16 update:  Pt f/u today.  He underwent bilateral VIM DBS on 10/07/16.  He had IPG placed on 10/23/16 and did stay one night overnight.  Pt reports that he has recovered well.  No falls since our last visit.  He is no longer on the primidone.  Neuroimaging has previously been performed.  It is available for my review today.  MRI was done 10/02/14 and was unremarkable.    ALLERGIES:   Allergies  Allergen Reactions  . Procaine Nausea And Vomiting    NOVOCAINE    CURRENT MEDICATIONS:  Outpatient Encounter Prescriptions as of 11/07/2016  Medication Sig  . aspirin 325 MG tablet Take 325 mg by mouth daily.  . Cyanocobalamin (VITAMIN B12 PO) Take 3,000 mcg by mouth daily. Reported on 08/27/2015  . Echinacea 400 MG CAPS Take 400 mg by mouth daily.  Marland Kitchen GLUCOSAMINE-CHONDROIT-MSM-C-MN PO Take 1 tablet by mouth daily.   . hydrochlorothiazide (HYDRODIURIL) 25 MG tablet Take 1 tablet (25 mg total) by mouth daily.  Marland Kitchen lisinopril (  PRINIVIL,ZESTRIL) 10 MG tablet Take 1 tablet (10 mg total) by mouth daily.  . Multiple Vitamins-Minerals (CENTRUM PO) Take 1 tablet by mouth daily.   . Omega-3 Fatty Acids (ULTRA OMEGA 3 PO) Take 690 mg by mouth 2 (two) times daily.   . Papaya TABS Take 2 tablets by mouth daily as needed (heartburn).  . Tetrahydroz-Glyc-Hyprom-PEG (VISINE MAXIMUM REDNESS RELIEF OP) Apply 1 drop to eye daily as needed (redness).   No facility-administered encounter medications on file as of 11/07/2016.     PAST MEDICAL HISTORY:   Past Medical History:  Diagnosis Date  . Blood in stool   . Erectile  dysfunction   . Essential tremor   . GERD (gastroesophageal reflux disease)   . Hx of colonic polyps   . Hypertension   . Pain in left knee   . Pain, joint, shoulder   . PONV (postoperative nausea and vomiting)   . Seasonal allergies   . Testicular hypofunction   . Tobacco use disorder   . Tremor   . Unspecified vitamin D deficiency     PAST SURGICAL HISTORY:   Past Surgical History:  Procedure Laterality Date  . COLONOSCOPY  09/09/2011   normal.  Previous polyps.  [name]. Repeat 5 years.  . COLONOSCOPY N/A 07/18/2016   Procedure: COLONOSCOPY;  Surgeon: Manya Silvas, MD;  Location: [facility] ENDOSCOPY;  Service: Endoscopy;  Laterality: N/A;  . HAND SURGERY Left 2011   trauma  . PULSE GENERATOR IMPLANT Bilateral 10/23/2016   Procedure: BILATERAL IMPLANTABLE PULSE GENERATOR;  Surgeon: Erline Levine, MD;  Location: [place];  Service: Neurosurgery;  Laterality: Bilateral;  BILATERAL IMPLANTABLE PULSE GENERATOR  . SUBTHALAMIC STIMULATOR INSERTION Bilateral 10/07/2016   Procedure: BILATERAL DEEP BRAIN STIMULATOR PLACEMENT;  Surgeon: Erline Levine, MD;  Location: [place];  Service: Neurosurgery;  Laterality: Bilateral;  BILATERAL DEEP BRAIN STIMULATOR PLACEMENT    SOCIAL HISTORY:   Social History   Social History  . Marital status: Married    Spouse name: N/A  . Number of children: 4  . Years of education: N/A   Occupational History  . building maintenance     [employer]   Social History Main Topics  . Smoking status: Former Smoker    Packs/day: 3.00    Years: 10.00    Types: Cigarettes    Quit date: 09/23/1970  . Smokeless tobacco: Never Used     Comment: quit in the 1970's  . Alcohol use 0.0 oz/week     Comment: occasional; once every 6 months  . Drug use: No  . Sexual activity: Yes   Other Topics Concern  . Not on file   Social History Narrative   Marital status: married x 45 years.      Children: 2 daughters, 2 sons; 6 grandchildren.      Lives: with wife.         Employment:  [employer] x 13 years; moderately happy.  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      Tobacco: quit 42 years ago; smoked x 10 years.       Alcohol: socially; weekends.  Rarely.      Drugs: none      Exercise:  None; work is physical.        Seatbelt: 100%      Sunscreen: rare sun exposure      Guns:  4 gun; unloaded, not stored in locked cabinet.       Smoke alarm and carbon monoxide detector in the  home.      Caffeine use: consumes a minimal amount   Living Will: patient does NOT have Living Will; Does NOT have HCPOA; getting ready to work on it   Organ Donor: NO          FAMILY HISTORY:   Family Status  Relation Status  . Mother Deceased at age 39   dementia  . Father Deceased at age 12   PD, heart disease  . Sister Alive   unknown  . Brother Deceased at age 64   Parkinson's disease  . Sister Alive   unknown  . Brother Alive   unknown  . Brother Alive   unknown  . Daughter Alive   tremor  . Son Alive   healthy    ROS:  A complete 10 system review of systems was obtained and was unremarkable apart from what is mentioned above.  PHYSICAL EXAMINATION:    VITALS:   Vitals:   11/07/16 0845  BP: (!) 152/72  Pulse: 73  Weight: 213 lb (96.6 kg)  Height: 5' 11"  (1.803 m)    GEN:  The patient appears stated age and is in NAD. HEENT:  Normocephalic, atraumatic.  The mucous membranes are moist. The superficial temporal arteries are without ropiness or tenderness. CV:  RRR Lungs:  CTAB Neck/HEME:  There are no carotid bruits bilaterally. Skin:  Battery sites and scalp incisions are well healed.    Neurological examination:  Orientation: The patient is alert and oriented x3. Fund of knowledge is appropriate.  Recent and remote memory are intact.  Attention and concentration are normal.    Able to name objects and repeat phrases. Cranial nerves: There is good facial symmetry.  The visual fields are full to confrontational testing. The speech is  fluent and clear. Soft palate rises symmetrically and there is no tongue deviation. Hearing is intact to conversational tone. Sensation: Sensation is intact to light and pinprick throughout (facial, trunk, extremities). Vibration is intact at the bilateral big toe. There is no extinction with double simultaneous stimulation. There is no sensory dermatomal level identified. Motor: Strength is 5/5 in the bilateral upper and lower extremities.   Shoulder shrug is equal and symmetric.  There is no pronator drift.   Movement examination: Tone: There is normal tone in the bilateral upper extremities.  The tone in the lower extremities is normal.  Abnormal movements:  There is no longer rest tremor even with it off.  He has tremor with the outstretched hands.  He is able to pour water this time without spilling it and with only minimal tremor on the right. Coordination:  There is  decremation with RAM's, only with toe taps on the L.  All other forms of RAM's are normal, with any form of RAMS, including alternating supination and pronation of the forearm, hand opening and closing, finger taps, heel taps bilaterally.  Gait and Station: The patient has no difficulty arising out of a deep-seated chair without the use of the hands. The patient's stride length is normal with normal stride length.  The patient has a negative pull test.    DBS programming was performed today, which is described in more detail on separate programming procedure notes.  In brief, following programming, there was near complete resolution of tremor on the right and complete resolution of tremor on the left.    ASSESSMENT/PLAN:  1.  Tremor  -He likely has long standing ET that is now manifesting as a resting tremor.   -  The patient had a dat scan at [facility] since our last visit.  There was mild asymmetry of update in caudates, less on the left than the right of uncertain etiology and could be from prominent perivascular space or from  remote lacute but said not typical for PD or any parkinsonian state.  He had an MRI of the brain in 2016 that was unremarkable.  -Patient underwent bilateral VIM DBS on 10/07/2016.  He had bilateral IPG placement on 10/23/2016.  Patient's DBS device was activated today, 11/07/2016.  -He will stay off of primidone. 2.  .  He has a follow-up appointment in a few weeks.   Total face to face time:  20 min.  this does not include DBS activation time, which was most of the time.

## 2016-11-07 ENCOUNTER — Encounter: Payer: Self-pay | Admitting: Neurology

## 2016-11-07 ENCOUNTER — Ambulatory Visit (INDEPENDENT_AMBULATORY_CARE_PROVIDER_SITE_OTHER): Payer: Medicare Other | Admitting: Neurology

## 2016-11-07 ENCOUNTER — Telehealth: Payer: Self-pay | Admitting: Neurology

## 2016-11-07 ENCOUNTER — Ambulatory Visit: Payer: Medicare Other | Admitting: Neurology

## 2016-11-07 VITALS — BP 152/72 | HR 73 | Ht 71.0 in | Wt 213.0 lb

## 2016-11-07 DIAGNOSIS — G250 Essential tremor: Secondary | ICD-10-CM

## 2016-11-07 NOTE — Telephone Encounter (Signed)
Spoke with patient and he is doing great.

## 2016-11-07 NOTE — Procedures (Signed)
DBS Programming was performed.    Total time spent programming was 35 minutes.  Device was turned on as this was initial activation.  Soft start was confirmed to be on.  Impedences were checked and were within normal limits.  Battery was checked and was determined to be functioning normally and not near the end of life.  Final settings were as follows:  Left brain electrode:     2-3+           ; Amplitude  2.2   V   ; Pulse width 90 microseconds;   Frequency   150   Hz.  Right brain electrode:     2-1+          ; Amplitude   2.5  V ;  Pulse width 90  microseconds;  Frequency   150    Hz.

## 2016-11-07 NOTE — Telephone Encounter (Signed)
Left message on machine for patient to call back.  To make sure he is doing okay after DBS programming today.

## 2016-11-26 NOTE — Progress Notes (Signed)
Carlos Hunt was seen today in the movement disorders clinic for neurologic consultation at the request of SMITH,KRISTI, MD.  I have reviewed numerous prior records made available to me.  This patient is accompanied in the office by his spouse who supplements the history.  The patient presents today for a second opinion regarding essential tremor.  He has been seeing [name] in [place] since 03/02/2014 and his last appointment with him was on November 05, 2015.  The patient reports that tremor began slowly, in approximately 2007.  Tremor started in both hands. Pt is R hand dominant but both hands shake. The patient notices tremor most when the hand is in use.  There is a family history of tremor in the patient's father, daughter, and brother, but his brother and father do have a known diagnosis of Parkinson's disease.  On his first visit with [name], the patient was started on primidone, 50 mg twice a day for essential tremor.  At his follow-up 2 months later, the patient reported that tremor was better, although he felt that the medication was causing significant dreams.  He continued on the medication until his February, 2017 follow-up and his primidone was increased to 125 mg twice a day.  Jaw tremor was noted that visit.  The patient called the neurology office a few weeks later because of anxiety and just "felt weird" and primidone was reduced to 75 mg twice a day.  04/24/16 update:  The patient was up today, accompanied by his wife who supplements the history.  Records were reviewed and are made available to me.  The patient had a dat scan at [facility] since our last visit.  There was mild asymmetry of update in caudates, less on the left than the right of uncertain etiology and could be from prominent perivascular space or from remote lacute but said not typical for PD or any parkinsonian state.  He had an MRI of the brain in 2016 that was unremarkable.  09/30/16 update:  Patient follows up today,  accompanied by his wife who supplements the history.  The patient had fiducials placed today in anticipation of DBS surgery for essential tremor.  Surgery is scheduled for one week from today, 10/07/2016.  The patient reports that he is ready for surgery.  He is currently on primidone, 50 mg bid.  Doesn't think that it is helping.  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    11/06/16 update:  Pt f/u today.  He underwent bilateral VIM DBS on 10/07/16.  He had IPG placed on 10/23/16 and did stay one night overnight.  Pt reports that he has recovered well.  No falls since our last visit.  He is no longer on the primidone.  11/27/16 update:  Patient follows up today.  His device was activated about 3 weeks ago.  The patient reports that he has done well.  He denies side effects with the device.  In regards to tremor, he states that he has minor tremor in the dominant hand.  No falls.  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  Something which was taking him 30 minutes is now taking him under 5 minutes.  He asks me to look at a place on the scalp.  Neuroimaging has previously been performed.  It is available for my review today.  MRI was done 10/02/14 and was unremarkable.    ALLERGIES:   Allergies  Allergen Reactions  .  Procaine Nausea And Vomiting    NOVOCAINE    CURRENT MEDICATIONS:  Outpatient Encounter Prescriptions as of 11/27/2016  Medication Sig  . aspirin 325 MG tablet Take 325 mg by mouth daily.  . Cyanocobalamin (VITAMIN B12 PO) Take 3,000 mcg by mouth daily. Reported on 08/27/2015  . Echinacea 400 MG CAPS Take 400 mg by mouth daily.  Marland Kitchen GLUCOSAMINE-CHONDROIT-MSM-C-MN PO Take 1 tablet by mouth daily.   . hydrochlorothiazide (HYDRODIURIL) 25 MG tablet Take 1 tablet (25 mg total) by mouth daily.  Marland Kitchen lisinopril (PRINIVIL,ZESTRIL) 10 MG tablet Take 1 tablet (10 mg total) by mouth daily.  . Multiple Vitamins-Minerals (CENTRUM PO) Take 1 tablet by mouth daily.   .  Omega-3 Fatty Acids (ULTRA OMEGA 3 PO) Take 690 mg by mouth 2 (two) times daily.   . Papaya TABS Take 2 tablets by mouth daily as needed (heartburn).  . Tetrahydroz-Glyc-Hyprom-PEG (VISINE MAXIMUM REDNESS RELIEF OP) Apply 1 drop to eye daily as needed (redness).   No facility-administered encounter medications on file as of 11/27/2016.     PAST MEDICAL HISTORY:   Past Medical History:  Diagnosis Date  . Blood in stool   . Erectile dysfunction   . Essential tremor   . GERD (gastroesophageal reflux disease)   . Hx of colonic polyps   . Hypertension   . Pain in left knee   . Pain, joint, shoulder   . PONV (postoperative nausea and vomiting)   . Seasonal allergies   . Testicular hypofunction   . Tobacco use disorder   . Tremor   . Unspecified vitamin D deficiency     PAST SURGICAL HISTORY:   Past Surgical History:  Procedure Laterality Date  . COLONOSCOPY  09/09/2011   normal.  Previous polyps.  [name]. Repeat 5 years.  . COLONOSCOPY N/A 07/18/2016   Procedure: COLONOSCOPY;  Surgeon: Manya Silvas, MD;  Location: [facility] ENDOSCOPY;  Service: Endoscopy;  Laterality: N/A;  . HAND SURGERY Left 2011   trauma  . PULSE GENERATOR IMPLANT Bilateral 10/23/2016   Procedure: BILATERAL IMPLANTABLE PULSE GENERATOR;  Surgeon: Erline Levine, MD;  Location: [place];  Service: Neurosurgery;  Laterality: Bilateral;  BILATERAL IMPLANTABLE PULSE GENERATOR  . SUBTHALAMIC STIMULATOR INSERTION Bilateral 10/07/2016   Procedure: BILATERAL DEEP BRAIN STIMULATOR PLACEMENT;  Surgeon: Erline Levine, MD;  Location: [place];  Service: Neurosurgery;  Laterality: Bilateral;  BILATERAL DEEP BRAIN STIMULATOR PLACEMENT    SOCIAL HISTORY:   Social History   Social History  . Marital status: Married    Spouse name: N/A  . Number of children: 4  . Years of education: N/A   Occupational History  . building maintenance     [employer]   Social History Main Topics  . Smoking status: Former Smoker    Packs/day:  3.00    Years: 10.00    Types: Cigarettes    Quit date: 09/23/1970  . Smokeless tobacco: Never Used     Comment: quit in the 1970's  . Alcohol use 0.0 oz/week     Comment: occasional; once every 6 months  . Drug use: No  . Sexual activity: Yes   Other Topics Concern  . Not on file   Social History Narrative   Marital status: married x 45 years.      Children: 2 daughters, 2 sons; 6 grandchildren.      Lives: with wife.        Employment:  [employer] x 13 years; moderately happy.  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      Tobacco: quit 42 years ago; smoked x 10 years.       Alcohol: socially; weekends.  Rarely.      Drugs: none      Exercise:  None; work is physical.        Seatbelt: 100%      Sunscreen: rare sun exposure      Guns:  4 gun; unloaded, not stored in locked cabinet.       Smoke alarm and carbon monoxide detector in the home.      Caffeine use: consumes a minimal amount   Living Will: patient does NOT have Living Will; Does NOT have HCPOA; getting ready to work on it   Organ Donor: NO          FAMILY HISTORY:   Family Status  Relation Status  . Mother Deceased at age 38   dementia  . Father Deceased at age 21   PD, heart disease  . Sister Alive   unknown  . Brother Deceased at age 47   Parkinson's disease  . Sister Alive   unknown  . Brother Alive   unknown  . Brother Alive   unknown  . Daughter Alive   tremor  . Son Alive   healthy    ROS:  A complete 10 system review of systems was obtained and was unremarkable apart from what is mentioned above.  PHYSICAL EXAMINATION:    VITALS:   Vitals:   11/27/16 1424  BP: 140/60  Pulse: 66  SpO2: 96%  Weight: 216 lb (98 kg)  Height: 5' 11"  (1.803 m)    GEN:  The patient appears stated age and is in NAD. HEENT:  Normocephalic, atraumatic.  The mucous membranes are moist. The superficial temporal arteries are without ropiness or tenderness. CV:  RRR Lungs:  CTAB Neck/HEME:  There are no  carotid bruits bilaterally. Skin:  Battery sites and scalp incisions are well healed.  There is nothing on the scalp that looks infected.  There is a Thier scab, but nothing erythematous.  Neurological examination:  Orientation: The patient is alert and oriented x3.  Cranial nerves: There is good facial symmetry.  The visual fields are full to confrontational testing. The speech is fluent and clear. Soft palate rises symmetrically and there is no tongue deviation. Hearing is intact to conversational tone. Sensation: Sensation is intact to light and pinprick throughout (facial, trunk, extremities). Vibration is intact at the bilateral big toe. There is no extinction with double simultaneous stimulation. There is no sensory dermatomal level identified. Motor: Strength is 5/5 in the bilateral upper and lower extremities.   Shoulder shrug is equal and symmetric.  There is no pronator drift.   Movement examination: Tone: There is normal tone in the bilateral upper extremities.  The tone in the lower extremities is normal.  Abnormal movements:  He has rare tremor of the right hand. Coordination:  There is  decremation with RAM's, only with toe taps on the L.  All other forms of RAM's are normal, with any form of RAMS, including alternating supination and pronation of the forearm, hand opening and closing, finger taps, heel taps bilaterally.  Gait and Station: The patient has no difficulty arising out of a deep-seated chair without the use of the hands. The patient's stride length is normal with normal stride length.  The patient has a negative pull test.    DBS programming was performed today, which is described in more  detail on separate programming procedure notes.  In brief, following programming, there was complete resolution of tremor.  ASSESSMENT/PLAN:  1.  Tremor  -He likely has long standing ET that is now manifesting as a resting tremor.   -The patient had a dat scan at [facility] since our  last visit.  There was mild asymmetry of update in caudates, less on the left than the right of uncertain etiology and could be from prominent perivascular space or from remote lacute but said not typical for PD or any parkinsonian state.  He had an MRI of the brain in 2016 that was unremarkable.  -Patient underwent bilateral VIM DBS on 10/07/2016.  He had bilateral IPG placement on 10/23/2016.  Patient's DBS device was activated today, 11/07/2016.  -Postop video was done today.  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  -He will stay off of primidone. 2.  Told the patient that he can follow-up with [name] if he would like and be seen here as needed, but the patient expresses desire to continue to follow with [place].  Follow up is anticipated in the next 5 months, sooner should new neurologic issues arise.

## 2016-11-27 ENCOUNTER — Ambulatory Visit (INDEPENDENT_AMBULATORY_CARE_PROVIDER_SITE_OTHER): Payer: Medicare Other | Admitting: Neurology

## 2016-11-27 ENCOUNTER — Encounter: Payer: Self-pay | Admitting: Neurology

## 2016-11-27 VITALS — BP 140/60 | HR 66 | Ht 71.0 in | Wt 216.0 lb

## 2016-11-27 DIAGNOSIS — G250 Essential tremor: Secondary | ICD-10-CM | POA: Diagnosis not present

## 2016-11-27 NOTE — Procedures (Signed)
DBS Programming was performed.    Total time spent programming was 20 minutes.  Device was confirmed to be on.  Soft start was confirmed to be on.  Impedences were checked and were within normal limits.  Battery was checked and was determined to be functioning normally and not near the end of life.  Final settings were as follows:  Left brain electrode:     2-3+           ; Amplitude  2.5   V   ; Pulse width 90 microseconds;   Frequency   150   Hz.  Right brain electrode:     2-1+          ; Amplitude   2.7  V ;  Pulse width 90  microseconds;  Frequency   150    Hz.

## 2016-12-02 ENCOUNTER — Encounter: Payer: Self-pay | Admitting: Family Medicine

## 2016-12-03 ENCOUNTER — Ambulatory Visit (INDEPENDENT_AMBULATORY_CARE_PROVIDER_SITE_OTHER): Payer: Medicare Other | Admitting: Family Medicine

## 2016-12-03 ENCOUNTER — Encounter: Payer: Self-pay | Admitting: Family Medicine

## 2016-12-03 VITALS — BP 148/66 | HR 66 | Temp 98.2°F | Resp 20 | Ht 71.0 in | Wt 212.4 lb

## 2016-12-03 DIAGNOSIS — Z0000 Encounter for general adult medical examination without abnormal findings: Secondary | ICD-10-CM

## 2016-12-03 DIAGNOSIS — I10 Essential (primary) hypertension: Secondary | ICD-10-CM

## 2016-12-03 DIAGNOSIS — Z125 Encounter for screening for malignant neoplasm of prostate: Secondary | ICD-10-CM | POA: Diagnosis not present

## 2016-12-03 DIAGNOSIS — R001 Bradycardia, unspecified: Secondary | ICD-10-CM

## 2016-12-03 DIAGNOSIS — E7800 Pure hypercholesterolemia, unspecified: Secondary | ICD-10-CM | POA: Diagnosis not present

## 2016-12-03 DIAGNOSIS — G250 Essential tremor: Secondary | ICD-10-CM | POA: Diagnosis not present

## 2016-12-03 LAB — POCT URINALYSIS DIP (MANUAL ENTRY)
Bilirubin, UA: NEGATIVE
Blood, UA: NEGATIVE
Glucose, UA: NEGATIVE
Ketones, POC UA: NEGATIVE
Leukocytes, UA: NEGATIVE
Nitrite, UA: NEGATIVE
Protein Ur, POC: NEGATIVE
Spec Grav, UA: 1.015 (ref 1.030–1.035)
Urobilinogen, UA: 0.2 (ref ?–2.0)
pH, UA: 7 (ref 5.0–8.0)

## 2016-12-03 MED ORDER — LISINOPRIL 10 MG PO TABS: 10.0000 mg | ORAL_TABLET | Freq: Every day | ORAL | 3 refills | Status: DC

## 2016-12-03 MED ORDER — HYDROCHLOROTHIAZIDE 25 MG PO TABS: 25.0000 mg | ORAL_TABLET | Freq: Every day | ORAL | 3 refills | Status: DC

## 2016-12-03 NOTE — Patient Instructions (Addendum)
   IF you received an x-ray today, you will receive an invoice from [facility]. Please contact [facility] at [phone] with questions or concerns regarding your invoice.   IF you received labwork today, you will receive an invoice from [facility]. Please contact [facility] at [phone] with questions or concerns regarding your invoice.   Our billing staff will not be able to assist you with questions regarding bills from these companies.  You will be contacted with the lab results as soon as they are available. The fastest way to get your results is to activate your My Chart account. Instructions are located on the last page of this paperwork. If you have not heard from us regarding the results in 2 weeks, please contact this office.      Preventive Care 65 Years and Older, Male Preventive care refers to lifestyle choices and visits with your health care provider that can promote health and wellness. What does preventive care include?  A yearly physical exam. This is also called an annual well check.  Dental exams once or twice a year.  Routine eye exams. Ask your health care provider how often you should have your eyes checked.  Personal lifestyle choices, including: ? Daily care of your teeth and gums. ? Regular physical activity. ? Eating a healthy diet. ? Avoiding tobacco and drug use. ? Limiting alcohol use. ? Practicing safe sex. ? Taking low doses of aspirin every day. ? Taking vitamin and mineral supplements as recommended by your health care provider. What happens during an annual well check? The services and screenings done by your health care provider during your annual well check will depend on your age, overall health, lifestyle risk factors, and family history of disease. Counseling Your health care provider may ask you questions about your:  Alcohol use.  Tobacco use.  Drug use.  Emotional well-being.  Home and relationship  well-being.  Sexual activity.  Eating habits.  History of falls.  Memory and ability to understand (cognition).  Work and work environment.  Screening You may have the following tests or measurements:  Height, weight, and BMI.  Blood pressure.  Lipid and cholesterol levels. These may be checked every 5 years, or more frequently if you are over 50 years old.  Skin check.  Lung cancer screening. You may have this screening every year starting at age 55 if you have a 30-pack-year history of smoking and currently smoke or have quit within the past 15 years.  Fecal occult blood test (FOBT) of the stool. You may have this test every year starting at age 50.  Flexible sigmoidoscopy or colonoscopy. You may have a sigmoidoscopy every 5 years or a colonoscopy every 10 years starting at age 50.  Prostate cancer screening. Recommendations will vary depending on your family history and other risks.  Hepatitis C blood test.  Hepatitis B blood test.  Sexually transmitted disease (STD) testing.  Diabetes screening. This is done by checking your blood sugar (glucose) after you have not eaten for a while (fasting). You may have this done every 1-3 years.  Abdominal aortic aneurysm (AAA) screening. You may need this if you are a current or former smoker.  Osteoporosis. You may be screened starting at age 70 if you are at high risk.  Talk with your health care provider about your test results, treatment options, and if necessary, the need for more tests. Vaccines Your health care provider may recommend certain vaccines, such as:  Influenza vaccine. This   This is recommended every year.  Tetanus, diphtheria, and acellular pertussis (Tdap, Td) vaccine. You may need a Td booster every 10 years.  Varicella vaccine. You may need this if you have not been vaccinated.  Zoster vaccine. You may need this after age 36.  Measles, mumps, and rubella (MMR) vaccine. You may need at least one dose of MMR  if you were born in 1957 or later. You may also need a second dose.  Pneumococcal 13-valent conjugate (PCV13) vaccine. One dose is recommended after age 90.  Pneumococcal polysaccharide (PPSV23) vaccine. One dose is recommended after age 23.  Meningococcal vaccine. You may need this if you have certain conditions.  Hepatitis A vaccine. You may need this if you have certain conditions or if you travel or work in places where you may be exposed to hepatitis A.  Hepatitis B vaccine. You may need this if you have certain conditions or if you travel or work in places where you may be exposed to hepatitis B.  Haemophilus influenzae type b (Hib) vaccine. You may need this if you have certain risk factors. Talk to your health care provider about which screenings and vaccines you need and how often you need them. This information is not intended to replace advice given to you by your health care provider. Make sure you discuss any questions you have with your health care provider. Document Released: 09/21/2015 Document Revised: 05/14/2016 Document Reviewed: 06/26/2015 Elsevier Interactive Patient Education  2017 [employer].  Carlos Hunt , Thank you for taking time to come for your Medicare Wellness Visit. I appreciate your ongoing commitment to your health goals. Please review the following plan we discussed and let me know if I can assist you in the future.   These are the goals we discussed: Goals    None      This is a list of the screening recommended for you and due dates:  Health Maintenance  Topic Date Due  . Tetanus Vaccine  09/23/2022  . Colon Cancer Screening  07/18/2026  . Flu Shot  Completed  .  Hepatitis C: One time screening is recommended by Center for Disease Control  (CDC) for  adults born from [birth year] through 1965.   Completed  . Pneumonia vaccines  Completed

## 2016-12-03 NOTE — Progress Notes (Signed)
Subjective:    Patient ID: Carlos Hunt, male    DOB: Jun 22, 1946, 71 y.o.   MRN: [id number]  12/03/2016  Annual Exam   HPI This 71 y.o. male presents for [facility] to [facility] and follow-up for chronic medical conditions.  Last physical: 11-27-15 Colonoscopy:  07-18-2016 PSA:  11-27-2015   Visual Acuity Screening   Right eye Left eye Both eyes  Without correction: 20/40 20/40 20/30   With correction:        Essential tremor:   [name] performed procedure; Dr. Carles Collet performed mapping.  Underwent adjustments.  Device turned on 11/10/16; follow-up 11/27/16; adjustment made on 11/27/16.  Primidone discontinued.  Underwent MRI at [facility]; then underwent psychiatric evaluation.  Cleared for procedure.  HTN: Patient reports good compliance with medication, good tolerance to medication, and good symptom control.    Immunization History  Administered Date(s) Administered  . Influenza, Seasonal, Injecte, Preservative Fre 09/23/2012  . Influenza,inj,Quad PF,36+ Mos 07/27/2013, 05/25/2015, 06/03/2016  . Influenza-Unspecified 06/08/2014  . Pneumococcal Conjugate-13 11/13/2014  . Pneumococcal Polysaccharide-23 11/27/2015  . Tdap 09/23/2012   BP Readings from Last 3 Encounters:  12/03/16 (!) 148/66  11/27/16 140/60  11/07/16 (!) 152/72   Wt Readings from Last 3 Encounters:  12/03/16 212 lb 6.4 oz (96.3 kg)  11/27/16 216 lb (98 kg)  11/07/16 213 lb (96.6 kg)    Review of Systems  Constitutional: Negative for activity change, appetite change, chills, diaphoresis, fatigue, fever and unexpected weight change.  HENT: Negative for congestion, dental problem, drooling, ear discharge, ear pain, facial swelling, hearing loss, mouth sores, nosebleeds, postnasal drip, rhinorrhea, sinus pressure, sneezing, sore throat, tinnitus, trouble swallowing and voice change.   Eyes: Negative for photophobia, pain, discharge, redness, itching and visual disturbance.  Respiratory: Negative for apnea,  cough, choking, chest tightness, shortness of breath, wheezing and stridor.   Cardiovascular: Negative for chest pain, palpitations and leg swelling.  Gastrointestinal: Negative for abdominal distention, abdominal pain, anal bleeding, blood in stool, constipation, diarrhea, nausea and vomiting.  Endocrine: Negative for cold intolerance, heat intolerance, polydipsia, polyphagia and polyuria.  Genitourinary: Negative for decreased urine volume, difficulty urinating, discharge, dysuria, enuresis, flank pain, frequency, genital sores, hematuria, penile pain, penile swelling, scrotal swelling, testicular pain and urgency.       Nocturia x 1.  Weaker stream unchanged from last year.  Musculoskeletal: Negative for arthralgias, back pain, gait problem, joint swelling, myalgias, neck pain and neck stiffness.  Skin: Negative for color change, pallor, rash and wound.  Allergic/Immunologic: Negative for environmental allergies, food allergies and immunocompromised state.  Neurological: Negative for dizziness, tremors, seizures, syncope, facial asymmetry, speech difficulty, weakness, light-headedness, numbness and headaches.  Hematological: Negative for adenopathy. Does not bruise/bleed easily.  Psychiatric/Behavioral: Negative for agitation, behavioral problems, confusion, decreased concentration, dysphoric mood, hallucinations, self-injury, sleep disturbance and suicidal ideas. The patient is not nervous/anxious and is not hyperactive.        Bedtime 8:00pm; wakes up 3:00am.    Past Medical History:  Diagnosis Date  . Blood in stool   . Erectile dysfunction   . Essential tremor   . GERD (gastroesophageal reflux disease)   . Hx of colonic polyps   . Hypertension   . Pain in left knee   . Pain, joint, shoulder   . PONV (postoperative nausea and vomiting)   . Seasonal allergies   . Testicular hypofunction   . Tobacco use disorder   . Tremor   . Unspecified vitamin D deficiency    Past  Surgical  History:  Procedure Laterality Date  . COLONOSCOPY  09/09/2011   normal.  Previous polyps.  [name]. Repeat 5 years.  . COLONOSCOPY N/A 07/18/2016   Procedure: COLONOSCOPY;  Surgeon: Manya Silvas, MD;  Location: [facility] ENDOSCOPY;  Service: Endoscopy;  Laterality: N/A;  . HAND SURGERY Left 2011   trauma  . PULSE GENERATOR IMPLANT Bilateral 10/23/2016   Procedure: BILATERAL IMPLANTABLE PULSE GENERATOR;  Surgeon: Erline Levine, MD;  Location: [place];  Service: Neurosurgery;  Laterality: Bilateral;  BILATERAL IMPLANTABLE PULSE GENERATOR  . SUBTHALAMIC STIMULATOR INSERTION Bilateral 10/07/2016   Procedure: BILATERAL DEEP BRAIN STIMULATOR PLACEMENT;  Surgeon: Erline Levine, MD;  Location: [place];  Service: Neurosurgery;  Laterality: Bilateral;  BILATERAL DEEP BRAIN STIMULATOR PLACEMENT   Allergies  Allergen Reactions  . Procaine Nausea And Vomiting    NOVOCAINE    Social History   Social History  . Marital status: Married    Spouse name: N/A  . Number of children: 4  . Years of education: N/A   Occupational History  . building maintenance     [employer]   Social History Main Topics  . Smoking status: Former Smoker    Packs/day: 3.00    Years: 10.00    Types: Cigarettes    Quit date: 09/23/1970  . Smokeless tobacco: Never Used     Comment: quit in the 1970's  . Alcohol use 0.0 oz/week     Comment: occasional; once every 6 months  . Drug use: No  . Sexual activity: Yes   Other Topics Concern  . Not on file   Social History Narrative   Marital status: married x 45 years.      Children: 2 daughters, 2 sons; 6 grandchildren.      Lives: with wife.        Employment:  [employer] x 13 years; moderately happy.  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      Tobacco: quit 42 years ago; smoked x 10 years.       Alcohol: socially; weekends.  Rarely.      Drugs: none      Exercise:  None; work is physical.        Seatbelt: 100%      Sunscreen: rare sun exposure      Guns:  4  gun; unloaded, not stored in locked cabinet.       Smoke alarm and carbon monoxide detector in the home.      Caffeine use: consumes a minimal amount   Living Will: patient does NOT have Living Will; Does NOT have HCPOA; getting ready to work on it   Organ Donor: NO         Family History  Problem Relation Age of Onset  . Dementia Mother   . Parkinson's disease Father   . Cancer Father 34    prostate  . Heart disease Father 10    CAD/CABG  . Hypertension Father   . Hyperlipidemia Father   . Parkinsonism Father   . Parkinson's disease Brother   . Diabetes Sister        Objective:    BP (!) 148/66   Pulse 66   Temp 98.2 F (36.8 C) (Oral)   Resp 20   Ht 5' 11"  (1.803 m)   Wt 212 lb 6.4 oz (96.3 kg)   SpO2 96%   BMI 29.62 kg/m  Physical Exam  Constitutional: He is oriented to person, place, and time. He appears well-developed and  well-nourished. No distress.  HENT:  Head: Normocephalic and atraumatic.  Right Ear: External ear normal.  Left Ear: External ear normal.  Nose: Nose normal.  Mouth/Throat: Oropharynx is clear and moist.  Eyes: Conjunctivae and EOM are normal. Pupils are equal, round, and reactive to light.  Neck: Normal range of motion. Neck supple. Carotid bruit is not present. No thyromegaly present.  Cardiovascular: Normal rate, regular rhythm, normal heart sounds and intact distal pulses.  Exam reveals no gallop and no friction rub.   No murmur heard. Pulmonary/Chest: Effort normal and breath sounds normal. He has no wheezes. He has no rales.  Abdominal: Soft. Bowel sounds are normal. He exhibits no distension and no mass. There is no tenderness. There is no rebound and no guarding. A hernia is present. Hernia confirmed positive in the right inguinal area. Hernia confirmed negative in the left inguinal area.  Genitourinary: Testes normal and penis normal.  Musculoskeletal:       Right shoulder: Normal.       Left shoulder: Normal.       Cervical back:  Normal.  Lymphadenopathy:    He has no cervical adenopathy.       Left: No inguinal adenopathy present.  Neurological: He is alert and oriented to person, place, and time. He has normal reflexes. No cranial nerve deficit. He exhibits normal muscle tone. Coordination normal.  Skin: Skin is warm and dry. No rash noted. He is not diaphoretic.  Psychiatric: He has a normal mood and affect. His behavior is normal. Judgment and thought content normal.   Depression screen [facility] 2/9 12/03/2016 06/03/2016 12/17/2015 11/27/2015 08/27/2015  Decreased Interest 0 0 0 0 0  Down, Depressed, Hopeless 0 0 0 0 0  PHQ - 2 Score 0 0 0 0 0   Fall Risk  12/03/2016 11/07/2016 09/30/2016 06/03/2016 11/27/2015  Falls in the past year? No No No No No  Number falls in past yr: - - - - -  Injury with Fall? - - - - -  Follow up - - - - -   Functional Status Survey: Is the patient deaf or have difficulty hearing?: No Does the patient have difficulty seeing, even when wearing glasses/contacts?: No Does the patient have difficulty concentrating, remembering, or making decisions?: No Does the patient have difficulty walking or climbing stairs?: No Does the patient have difficulty dressing or bathing?: No Does the patient have difficulty doing errands alone such as visiting a doctor's office or shopping?: No      Assessment & Plan:   1. Welcome to Medicare preventive visit   2. Essential hypertension, benign   3. Sinus bradycardia   4. Essential tremor   5. Pure hypercholesterolemia   6. Screening for prostate cancer    -anticipatory guidance --- exercise, weight loss, safe driving practices. -independent with ADLs; low fall risk; no evidence of depression; no hearing loss.  Discussed Advanced Directives with patient. -obtain appropriate screening labs.   -obtain labs for chronic disease management.  -refills of medication provided. -s/p B deep brain stimulator placement by Dr. Donald Pore for essential tremor; now no  longer suffers with moderate to severe tremors of B upper extremities; doing extremely well.    Orders Placed This Encounter  Procedures  . CBC with Differential/Platelet  . Comprehensive metabolic panel    Order Specific Question:   Has the patient fasted?    Answer:   Yes  . Lipid panel    Order Specific Question:   Has the  patient fasted?    Answer:   Yes  . PSA, Medicare  . POCT urinalysis dipstick  . EKG 12-Lead   Meds ordered this encounter  Medications  . cholecalciferol (VITAMIN D) 1000 units tablet    Sig: Take 2,000 Units by mouth daily.  . hydrochlorothiazide (HYDRODIURIL) 25 MG tablet    Sig: Take 1 tablet (25 mg total) by mouth daily.    Dispense:  90 tablet    Refill:  3  . lisinopril (PRINIVIL,ZESTRIL) 10 MG tablet    Sig: Take 1 tablet (10 mg total) by mouth daily.    Dispense:  90 tablet    Refill:  3    Return in about 6 months (around 06/05/2017) for recheck high blood pressure.   Maiyah Goyne Elayne Guerin, M.D. Primary Care at [facility] [address] [place], [place]  [address] [phone] phone [phone] fax

## 2016-12-04 LAB — CBC WITH DIFFERENTIAL/PLATELET
Basophils Absolute: 0.1 10*3/uL (ref 0.0–0.2)
Basos: 1 %
EOS (ABSOLUTE): 0.1 10*3/uL (ref 0.0–0.4)
Eos: 1 %
Hematocrit: 45.4 % (ref 37.5–51.0)
Hemoglobin: 15.4 g/dL (ref 13.0–17.7)
Immature Grans (Abs): 0 10*3/uL (ref 0.0–0.1)
Immature Granulocytes: 0 %
Lymphocytes Absolute: 2.6 10*3/uL (ref 0.7–3.1)
Lymphs: 28 %
MCH: 31.9 pg (ref 26.6–33.0)
MCHC: 33.9 g/dL (ref 31.5–35.7)
MCV: 94 fL (ref 79–97)
Monocytes Absolute: 0.8 10*3/uL (ref 0.1–0.9)
Monocytes: 8 %
Neutrophils Absolute: 5.7 10*3/uL (ref 1.4–7.0)
Neutrophils: 62 %
Platelets: 294 10*3/uL (ref 150–379)
RBC: 4.83 x10E6/uL (ref 4.14–5.80)
RDW: 13.7 % (ref 12.3–15.4)
WBC: 9.3 10*3/uL (ref 3.4–10.8)

## 2016-12-04 LAB — LIPID PANEL
Chol/HDL Ratio: 3.8 ratio units (ref 0.0–5.0)
Cholesterol, Total: 165 mg/dL (ref 100–199)
HDL: 43 mg/dL (ref >39)
LDL Calculated: 93 mg/dL (ref 0–99)
Triglycerides: 144 mg/dL (ref 0–149)
VLDL Cholesterol Cal: 29 mg/dL (ref 5–40)

## 2016-12-04 LAB — COMPREHENSIVE METABOLIC PANEL
ALT: 50 IU/L — ABNORMAL HIGH (ref 0–44)
AST: 38 IU/L (ref 0–40)
Albumin/Globulin Ratio: 1.6 (ref 1.2–2.2)
Albumin: 4.5 g/dL (ref 3.5–4.8)
Alkaline Phosphatase: 78 IU/L (ref 39–117)
BUN/Creatinine Ratio: 17 (ref 10–24)
BUN: 20 mg/dL (ref 8–27)
Bilirubin Total: 0.4 mg/dL (ref 0.0–1.2)
CO2: 27 mmol/L (ref 18–29)
Calcium: 9.8 mg/dL (ref 8.6–10.2)
Chloride: 96 mmol/L (ref 96–106)
Creatinine, Ser: 1.19 mg/dL (ref 0.76–1.27)
GFR calc Af Amer: 71 mL/min/{1.73_m2} (ref >59)
GFR calc non Af Amer: 62 mL/min/{1.73_m2} (ref >59)
Globulin, Total: 2.9 g/dL (ref 1.5–4.5)
Glucose: 99 mg/dL (ref 65–99)
Potassium: 4.7 mmol/L (ref 3.5–5.2)
Sodium: 139 mmol/L (ref 134–144)
Total Protein: 7.4 g/dL (ref 6.0–8.5)

## 2016-12-04 LAB — PSA: Prostate Specific Ag, Serum: 0.8 ng/mL (ref 0.0–4.0)

## 2017-02-06 NOTE — Addendum Note (Signed)
Addendum  created 02/06/17 0950 by Effie Berkshire, MD   Sign clinical note

## 2017-02-25 ENCOUNTER — Telehealth: Payer: Self-pay | Admitting: Neurology

## 2017-02-25 NOTE — Telephone Encounter (Signed)
PT called and said that his tremors have returned and wanted to see Dr Tat sooner than his august appointment

## 2017-02-25 NOTE — Telephone Encounter (Signed)
Spoke with patient. He states about a week ago his tremors have come back - almost as bad as prior to surgery - and having trouble with speech. Come out of no where. No new medications. No falls. No numbness, tingling, or new symptoms.  He checked his device. It is on (both sides) and battery is okay. Setting has not been changed to his knowledge.   Please advise.

## 2017-02-25 NOTE — Telephone Encounter (Signed)
Tried to call patient with no answer and no voicemail set up.

## 2017-02-26 NOTE — Telephone Encounter (Signed)
Appt made 03/10/2017 at 2:30 pm. Tried to let patient know but no answer. Will try again later.

## 2017-02-26 NOTE — Telephone Encounter (Signed)
That is strange.  He isn't numb or weak on one side more than another, just the return of tremor and speech?  He can be put in on work in day July 3 (end of day if able but if not, is okay)

## 2017-02-26 NOTE — Telephone Encounter (Signed)
LMOM making patient aware of date/time. He should call back to confirm.

## 2017-02-27 ENCOUNTER — Telehealth: Payer: Self-pay | Admitting: Neurology

## 2017-02-27 NOTE — Telephone Encounter (Signed)
Spoke with patient's wife.  Aware I have spoken with him. She states he always underplays his symptoms and I advised if he looks this concerning and not feeling well he needs to get in to see PCP today to evaluate for different causes. She expressed understanding. They were supposed to be out of town on 03/10/2017, but will leave appt on that date for now until we find out what PCP thinks of new symptoms.

## 2017-02-27 NOTE — Telephone Encounter (Signed)
Patient's wife called and said that she was concerned about her husband. She said that he doesn't look good. Please Advise. Thanks

## 2017-03-10 ENCOUNTER — Ambulatory Visit (INDEPENDENT_AMBULATORY_CARE_PROVIDER_SITE_OTHER): Payer: Medicare Other | Admitting: Neurology

## 2017-03-10 ENCOUNTER — Emergency Department (HOSPITAL_COMMUNITY): Payer: Medicare Other

## 2017-03-10 ENCOUNTER — Encounter (HOSPITAL_COMMUNITY)
Admission: EM | Disposition: A | Discharge status: Home Health | Payer: Self-pay | Source: Home / Self Care | Attending: Neurosurgery

## 2017-03-10 ENCOUNTER — Inpatient Hospital Stay (HOSPITAL_COMMUNITY)
Admission: EM | Admit: 2017-03-10 | Discharge: 2017-03-13 | DRG: 025 | Disposition: A | Discharge status: Home Health | Payer: Medicare Other | Attending: Neurosurgery | Admitting: Neurosurgery

## 2017-03-10 ENCOUNTER — Emergency Department (HOSPITAL_COMMUNITY): Payer: Medicare Other | Admitting: Anesthesiology

## 2017-03-10 ENCOUNTER — Encounter (HOSPITAL_COMMUNITY): Payer: Self-pay

## 2017-03-10 ENCOUNTER — Encounter: Payer: Self-pay | Admitting: Neurology

## 2017-03-10 VITALS — BP 140/64 | HR 64 | Ht 71.0 in | Wt 212.0 lb

## 2017-03-10 DIAGNOSIS — E559 Vitamin D deficiency, unspecified: Secondary | ICD-10-CM | POA: Diagnosis not present

## 2017-03-10 DIAGNOSIS — I63312 Cerebral infarction due to thrombosis of left middle cerebral artery: Secondary | ICD-10-CM

## 2017-03-10 DIAGNOSIS — R2990 Unspecified symptoms and signs involving the nervous system: Secondary | ICD-10-CM

## 2017-03-10 DIAGNOSIS — R4781 Slurred speech: Secondary | ICD-10-CM | POA: Diagnosis not present

## 2017-03-10 DIAGNOSIS — G250 Essential tremor: Secondary | ICD-10-CM

## 2017-03-10 DIAGNOSIS — K219 Gastro-esophageal reflux disease without esophagitis: Secondary | ICD-10-CM | POA: Diagnosis present

## 2017-03-10 DIAGNOSIS — Z87891 Personal history of nicotine dependence: Secondary | ICD-10-CM | POA: Diagnosis not present

## 2017-03-10 DIAGNOSIS — Z884 Allergy status to anesthetic agent status: Secondary | ICD-10-CM

## 2017-03-10 DIAGNOSIS — IMO0002 Reserved for concepts with insufficient information to code with codable children: Secondary | ICD-10-CM

## 2017-03-10 DIAGNOSIS — T85731A Infection and inflammatory reaction due to implanted electronic neurostimulator of brain, electrode (lead), initial encounter: Principal | ICD-10-CM | POA: Diagnosis present

## 2017-03-10 DIAGNOSIS — Y752 Prosthetic and other implants, materials and neurological devices associated with adverse incidents: Secondary | ICD-10-CM | POA: Diagnosis present

## 2017-03-10 DIAGNOSIS — Z7982 Long term (current) use of aspirin: Secondary | ICD-10-CM

## 2017-03-10 DIAGNOSIS — I10 Essential (primary) hypertension: Secondary | ICD-10-CM | POA: Diagnosis present

## 2017-03-10 DIAGNOSIS — R229 Localized swelling, mass and lump, unspecified: Secondary | ICD-10-CM

## 2017-03-10 DIAGNOSIS — I639 Cerebral infarction, unspecified: Secondary | ICD-10-CM | POA: Diagnosis not present

## 2017-03-10 DIAGNOSIS — G060 Intracranial abscess and granuloma: Secondary | ICD-10-CM | POA: Diagnosis not present

## 2017-03-10 DIAGNOSIS — R001 Bradycardia, unspecified: Secondary | ICD-10-CM | POA: Diagnosis not present

## 2017-03-10 DIAGNOSIS — G936 Cerebral edema: Secondary | ICD-10-CM | POA: Diagnosis present

## 2017-03-10 HISTORY — PX: SUBTHALAMIC STIMULATOR REMOVAL: SHX5406

## 2017-03-10 LAB — I-STAT CHEM 8, ED
BUN: 17 mg/dL (ref 6–20)
Calcium, Ion: 1.16 mmol/L (ref 1.15–1.40)
Chloride: 101 mmol/L (ref 101–111)
Creatinine, Ser: 1 mg/dL (ref 0.61–1.24)
Glucose, Bld: 147 mg/dL — ABNORMAL HIGH (ref 65–99)
HCT: 45 % (ref 39.0–52.0)
Hemoglobin: 15.3 g/dL (ref 13.0–17.0)
Potassium: 3.7 mmol/L (ref 3.5–5.1)
Sodium: 140 mmol/L (ref 135–145)
TCO2: 28 mmol/L (ref 0–100)

## 2017-03-10 LAB — COMPREHENSIVE METABOLIC PANEL
ALT: 56 U/L (ref 17–63)
AST: 34 U/L (ref 15–41)
Albumin: 4 g/dL (ref 3.5–5.0)
Alkaline Phosphatase: 82 U/L (ref 38–126)
Anion gap: 10 (ref 5–15)
BUN: 14 mg/dL (ref 6–20)
CO2: 26 mmol/L (ref 22–32)
Calcium: 9.5 mg/dL (ref 8.9–10.3)
Chloride: 102 mmol/L (ref 101–111)
Creatinine, Ser: 1.11 mg/dL (ref 0.61–1.24)
GFR calc Af Amer: >60 mL/min (ref >60)
GFR calc non Af Amer: >60 mL/min (ref >60)
Glucose, Bld: 153 mg/dL — ABNORMAL HIGH (ref 65–99)
Potassium: 3.6 mmol/L (ref 3.5–5.1)
Sodium: 138 mmol/L (ref 135–145)
Total Bilirubin: 0.7 mg/dL (ref 0.3–1.2)
Total Protein: 6.9 g/dL (ref 6.5–8.1)

## 2017-03-10 LAB — DIFFERENTIAL
Basophils Absolute: 0.1 10*3/uL (ref 0.0–0.1)
Basophils Relative: 1 %
Eosinophils Absolute: 0.2 10*3/uL (ref 0.0–0.7)
Eosinophils Relative: 2 %
Lymphocytes Relative: 24 %
Lymphs Abs: 2.4 10*3/uL (ref 0.7–4.0)
Monocytes Absolute: 0.7 10*3/uL (ref 0.1–1.0)
Monocytes Relative: 7 %
Neutro Abs: 6.7 10*3/uL (ref 1.7–7.7)
Neutrophils Relative %: 66 %

## 2017-03-10 LAB — CBC
HCT: 43.8 % (ref 39.0–52.0)
Hemoglobin: 15.3 g/dL (ref 13.0–17.0)
MCH: 32.1 pg (ref 26.0–34.0)
MCHC: 34.9 g/dL (ref 30.0–36.0)
MCV: 91.8 fL (ref 78.0–100.0)
Platelets: 282 10*3/uL (ref 150–400)
RBC: 4.77 MIL/uL (ref 4.22–5.81)
RDW: 13.2 % (ref 11.5–15.5)
WBC: 10 10*3/uL (ref 4.0–10.5)

## 2017-03-10 LAB — I-STAT TROPONIN, ED: Troponin i, poc: 0 ng/mL (ref 0.00–0.08)

## 2017-03-10 LAB — PROTIME-INR
INR: 1.04
Prothrombin Time: 13.6 seconds (ref 11.4–15.2)

## 2017-03-10 LAB — APTT: aPTT: 27 seconds (ref 24–36)

## 2017-03-10 SURGERY — SUBTHALAMIC STIMULATOR REMOVAL
Anesthesia: General | Site: Head | Laterality: Left

## 2017-03-10 MED ORDER — LIDOCAINE HCL (CARDIAC) 20 MG/ML IV SOLN
INTRAVENOUS | Status: DC | PRN
  Administered 2017-03-10: 50 mg via INTRAVENOUS

## 2017-03-10 MED ORDER — MIDAZOLAM HCL 2 MG/2ML IJ SOLN
INTRAMUSCULAR | Status: AC
  Filled 2017-03-10: qty 2

## 2017-03-10 MED ORDER — ONDANSETRON HCL 4 MG/2ML IJ SOLN
INTRAMUSCULAR | Status: AC
  Filled 2017-03-10: qty 2

## 2017-03-10 MED ORDER — DEXAMETHASONE SODIUM PHOSPHATE 10 MG/ML IJ SOLN
INTRAMUSCULAR | Status: AC
  Filled 2017-03-10: qty 1

## 2017-03-10 MED ORDER — SUGAMMADEX SODIUM 200 MG/2ML IV SOLN
INTRAVENOUS | Status: AC
  Filled 2017-03-10: qty 2

## 2017-03-10 MED ORDER — PHENYLEPHRINE 40 MCG/ML (10ML) SYRINGE FOR IV PUSH (FOR BLOOD PRESSURE SUPPORT)
PREFILLED_SYRINGE | INTRAVENOUS | Status: AC
  Filled 2017-03-10: qty 10

## 2017-03-10 MED ORDER — EPHEDRINE SULFATE 50 MG/ML IJ SOLN
INTRAMUSCULAR | Status: DC | PRN
  Administered 2017-03-10 (×2): 10 mg via INTRAVENOUS

## 2017-03-10 MED ORDER — ROCURONIUM BROMIDE 100 MG/10ML IV SOLN
INTRAVENOUS | Status: DC | PRN
  Administered 2017-03-10: 20 mg via INTRAVENOUS
  Administered 2017-03-10: 40 mg via INTRAVENOUS

## 2017-03-10 MED ORDER — LACTATED RINGERS IV SOLN
INTRAVENOUS | Status: DC | PRN
  Administered 2017-03-10: 23:00:00 via INTRAVENOUS

## 2017-03-10 MED ORDER — FENTANYL CITRATE (PF) 250 MCG/5ML IJ SOLN
INTRAMUSCULAR | Status: AC
Start: 2017-03-10 — End: ?
  Filled 2017-03-10: qty 5

## 2017-03-10 MED ORDER — BACITRACIN ZINC 500 UNIT/GM EX OINT
TOPICAL_OINTMENT | CUTANEOUS | Status: AC
  Filled 2017-03-10: qty 28.35

## 2017-03-10 MED ORDER — FENTANYL CITRATE (PF) 100 MCG/2ML IJ SOLN
INTRAMUSCULAR | Status: DC | PRN
  Administered 2017-03-10: 50 ug via INTRAVENOUS
  Administered 2017-03-10: 100 ug via INTRAVENOUS

## 2017-03-10 MED ORDER — THROMBIN 5000 UNITS EX SOLR
CUTANEOUS | Status: AC
  Filled 2017-03-10: qty 5000

## 2017-03-10 MED ORDER — LIDOCAINE-EPINEPHRINE 1 %-1:100000 IJ SOLN
INTRAMUSCULAR | Status: AC
  Filled 2017-03-10: qty 1

## 2017-03-10 MED ORDER — PROPOFOL 10 MG/ML IV BOLUS
INTRAVENOUS | Status: AC
  Filled 2017-03-10: qty 20

## 2017-03-10 MED ORDER — VANCOMYCIN HCL IN DEXTROSE 1-5 GM/200ML-% IV SOLN
INTRAVENOUS | Status: AC
  Filled 2017-03-10: qty 200

## 2017-03-10 MED ORDER — THROMBIN 20000 UNITS EX SOLR
CUTANEOUS | Status: AC
  Filled 2017-03-10: qty 20000

## 2017-03-10 MED ORDER — PROPOFOL 10 MG/ML IV BOLUS
INTRAVENOUS | Status: DC | PRN
  Administered 2017-03-10: 110 mg via INTRAVENOUS

## 2017-03-10 MED ORDER — IOPAMIDOL (ISOVUE-300) INJECTION 61%
INTRAVENOUS | Status: AC
  Administered 2017-03-10: 75 mL
  Filled 2017-03-10: qty 75

## 2017-03-10 SURGICAL SUPPLY — 67 items
BAG DECANTER FOR FLEXI CONT (MISCELLANEOUS) ×3 IMPLANT
BLADE CLIPPER SURG (BLADE) ×3 IMPLANT
BNDG COHESIVE 4X5 TAN NS LF (GAUZE/BANDAGES/DRESSINGS) ×3 IMPLANT
BUR ACORN 6.0 PRECISION (BURR) ×2 IMPLANT
BUR ACORN 6.0MM PRECISION (BURR) ×1
BUR SPIRAL ROUTER 2.3 (BUR) IMPLANT
BUR SPIRAL ROUTER 2.3MM (BUR)
CANISTER SUCT 3000ML PPV (MISCELLANEOUS) ×6 IMPLANT
CARTRIDGE OIL MAESTRO DRILL (MISCELLANEOUS) ×1 IMPLANT
CATH VENTRIC 35X38 W/TROCAR LG (CATHETERS) ×3 IMPLANT
CONT SPEC 4OZ CLIKSEAL STRL BL (MISCELLANEOUS) ×3 IMPLANT
DERMABOND ADVANCED (GAUZE/BANDAGES/DRESSINGS) ×2
DERMABOND ADVANCED .7 DNX12 (GAUZE/BANDAGES/DRESSINGS) ×1 IMPLANT
DIFFUSER DRILL AIR PNEUMATIC (MISCELLANEOUS) ×3 IMPLANT
DRAPE CAMERA VIDEO/LASER (DRAPES) IMPLANT
DRAPE MICROSCOPE LEICA (MISCELLANEOUS) IMPLANT
DRAPE NEUROLOGICAL W/INCISE (DRAPES) IMPLANT
DRAPE STERI IOBAN 125X83 (DRAPES) IMPLANT
DRAPE SURG 17X23 STRL (DRAPES) IMPLANT
DRAPE WARM FLUID 44X44 (DRAPE) ×3 IMPLANT
DRSG OPSITE POSTOP 4X6 (GAUZE/BANDAGES/DRESSINGS) ×3 IMPLANT
ELECT CAUTERY BLADE 6.4 (BLADE) ×3 IMPLANT
ELECT REM PT RETURN 9FT ADLT (ELECTROSURGICAL) ×3
ELECTRODE REM PT RTRN 9FT ADLT (ELECTROSURGICAL) ×1 IMPLANT
GAUZE SPONGE 4X4 12PLY STRL (GAUZE/BANDAGES/DRESSINGS) ×3 IMPLANT
GAUZE SPONGE 4X4 16PLY XRAY LF (GAUZE/BANDAGES/DRESSINGS) IMPLANT
GLOVE BIO SURGEON STRL SZ 6.5 (GLOVE) ×2 IMPLANT
GLOVE BIO SURGEON STRL SZ7 (GLOVE) ×6 IMPLANT
GLOVE BIO SURGEONS STRL SZ 6.5 (GLOVE) ×1
GLOVE ECLIPSE 9.0 STRL (GLOVE) ×3 IMPLANT
GLOVE EXAM NITRILE LRG STRL (GLOVE) IMPLANT
GLOVE EXAM NITRILE XL STR (GLOVE) IMPLANT
GLOVE EXAM NITRILE XS STR PU (GLOVE) IMPLANT
GOWN STRL REUS W/ TWL LRG LVL3 (GOWN DISPOSABLE) IMPLANT
GOWN STRL REUS W/ TWL XL LVL3 (GOWN DISPOSABLE) IMPLANT
GOWN STRL REUS W/TWL 2XL LVL3 (GOWN DISPOSABLE) IMPLANT
GOWN STRL REUS W/TWL LRG LVL3 (GOWN DISPOSABLE)
GOWN STRL REUS W/TWL XL LVL3 (GOWN DISPOSABLE)
HEMOSTAT SURGICEL 2X14 (HEMOSTASIS) ×3 IMPLANT
KIT BASIN OR (CUSTOM PROCEDURE TRAY) ×3 IMPLANT
KIT ROOM TURNOVER OR (KITS) ×3 IMPLANT
NEEDLE HYPO 18GX1.5 BLUNT FILL (NEEDLE) IMPLANT
NEEDLE HYPO 25X1 1.5 SAFETY (NEEDLE) ×3 IMPLANT
NS IRRIG 1000ML POUR BTL (IV SOLUTION) ×6 IMPLANT
OIL CARTRIDGE MAESTRO DRILL (MISCELLANEOUS) ×3
PACK CRANIOTOMY (CUSTOM PROCEDURE TRAY) ×3 IMPLANT
PAD ARMBOARD 7.5X6 YLW CONV (MISCELLANEOUS) ×9 IMPLANT
PATTIES SURGICAL .25X.25 (GAUZE/BANDAGES/DRESSINGS) IMPLANT
PATTIES SURGICAL .5 X.5 (GAUZE/BANDAGES/DRESSINGS) IMPLANT
PATTIES SURGICAL .5 X3 (DISPOSABLE) IMPLANT
PATTIES SURGICAL 1X1 (DISPOSABLE) IMPLANT
RUBBERBAND STERILE (MISCELLANEOUS) IMPLANT
SPONGE NEURO XRAY DETECT 1X3 (DISPOSABLE) IMPLANT
SPONGE SURGIFOAM ABS GEL 100 (HEMOSTASIS) ×3 IMPLANT
STAPLER VISISTAT 35W (STAPLE) ×3 IMPLANT
STOCKINETTE 6  STRL (DRAPES) ×2
STOCKINETTE 6 STRL (DRAPES) ×1 IMPLANT
STOCKINETTE TUBULAR SYNTH 6IN (GAUZE/BANDAGES/DRESSINGS) ×3 IMPLANT
SUT NURALON 4 0 TR CR/8 (SUTURE) IMPLANT
SUT VIC AB 2-0 CT2 18 VCP726D (SUTURE) ×6 IMPLANT
SUT VIC AB 3-0 SH 8-18 (SUTURE) ×3 IMPLANT
SYR CONTROL 10ML LL (SYRINGE) ×3 IMPLANT
TOWEL GREEN STERILE (TOWEL DISPOSABLE) ×3 IMPLANT
TOWEL GREEN STERILE FF (TOWEL DISPOSABLE) ×3 IMPLANT
TRAY FOLEY W/METER SILVER 16FR (SET/KITS/TRAYS/PACK) ×3 IMPLANT
UNDERPAD 30X30 (UNDERPADS AND DIAPERS) ×3 IMPLANT
WATER STERILE IRR 1000ML POUR (IV SOLUTION) ×3 IMPLANT

## 2017-03-10 NOTE — H&P (Signed)
Carlos Hunt is an 71 y.o. male.   Chief Complaint: Aphasia HPI: 71 year old male status post bilateral placement of deep brain stimulator for treatment of essential tremor in January of this year. Patient has noted a decline in speech and language function over the past 1-1/2 months. Symptoms are then progressively worsening. Also noted some difficulty with right-sided movement although no discrete weakness. No history of fever. No history of wound drainage. No history of seizure. Patient otherwise feels well.  Past Medical History:  Diagnosis Date  . Blood in stool   . Erectile dysfunction   . Essential tremor   . GERD (gastroesophageal reflux disease)   . Hx of colonic polyps   . Hypertension   . Pain in left knee   . Pain, joint, shoulder   . PONV (postoperative nausea and vomiting)   . Seasonal allergies   . Testicular hypofunction   . Tobacco use disorder   . Tremor   . Unspecified vitamin D deficiency     Past Surgical History:  Procedure Laterality Date  . COLONOSCOPY  09/09/2011   normal.  Previous polyps.  [name]. Repeat 5 years.  . COLONOSCOPY N/A 07/18/2016   Procedure: COLONOSCOPY;  Surgeon: Manya Silvas, MD;  Location: [facility] ENDOSCOPY;  Service: Endoscopy;  Laterality: N/A;  . HAND SURGERY Left 2011   trauma  . PULSE GENERATOR IMPLANT Bilateral 10/23/2016   Procedure: BILATERAL IMPLANTABLE PULSE GENERATOR;  Surgeon: Erline Levine, MD;  Location: [facility];  Service: Neurosurgery;  Laterality: Bilateral;  BILATERAL IMPLANTABLE PULSE GENERATOR  . SUBTHALAMIC STIMULATOR INSERTION Bilateral 10/07/2016   Procedure: BILATERAL DEEP BRAIN STIMULATOR PLACEMENT;  Surgeon: Erline Levine, MD;  Location: [facility];  Service: Neurosurgery;  Laterality: Bilateral;  BILATERAL DEEP BRAIN STIMULATOR PLACEMENT    Family History  Problem Relation Age of Onset  . Dementia Mother   . Parkinson's disease Father   . Cancer Father 68       prostate  . Heart disease Father 67       CAD/CABG  .  Hypertension Father   . Hyperlipidemia Father   . Parkinsonism Father   . Parkinson's disease Brother   . Diabetes Sister    Social History:  reports that he quit smoking about 46 years ago. His smoking use included Cigarettes. He has a 30.00 pack-year smoking history. He has never used smokeless tobacco. He reports that he drinks alcohol. He reports that he does not use drugs.  Allergies:  Allergies  Allergen Reactions  . Procaine Nausea And Vomiting    NOVOCAINE     (Not in a hospital admission)  Results for orders placed or performed during the hospital encounter of 03/10/17 (from the past 48 hour(s))  Protime-INR     Status: None   Collection Time: 03/10/17  3:49 PM  Result Value Ref Range   Prothrombin Time 13.6 11.4 - 15.2 seconds   INR 1.04   APTT     Status: None   Collection Time: 03/10/17  3:49 PM  Result Value Ref Range   aPTT 27 24 - 36 seconds  CBC     Status: None   Collection Time: 03/10/17  3:49 PM  Result Value Ref Range   WBC 10.0 4.0 - 10.5 K/uL   RBC 4.77 4.22 - 5.81 MIL/uL   Hemoglobin 15.3 13.0 - 17.0 g/dL   HCT 43.8 39.0 - 52.0 %   MCV 91.8 78.0 - 100.0 fL   MCH 32.1 26.0 - 34.0 pg  MCHC 34.9 30.0 - 36.0 g/dL   RDW 13.2 11.5 - 15.5 %   Platelets 282 150 - 400 K/uL  Differential     Status: None   Collection Time: 03/10/17  3:49 PM  Result Value Ref Range   Neutrophils Relative % 66 %   Neutro Abs 6.7 1.7 - 7.7 K/uL   Lymphocytes Relative 24 %   Lymphs Abs 2.4 0.7 - 4.0 K/uL   Monocytes Relative 7 %   Monocytes Absolute 0.7 0.1 - 1.0 K/uL   Eosinophils Relative 2 %   Eosinophils Absolute 0.2 0.0 - 0.7 K/uL   Basophils Relative 1 %   Basophils Absolute 0.1 0.0 - 0.1 K/uL  Comprehensive metabolic panel     Status: Abnormal   Collection Time: 03/10/17  3:49 PM  Result Value Ref Range   Sodium 138 135 - 145 mmol/L   Potassium 3.6 3.5 - 5.1 mmol/L   Chloride 102 101 - 111 mmol/L   CO2 26 22 - 32 mmol/L   Glucose, Bld 153 (H) 65 - 99 mg/dL    BUN 14 6 - 20 mg/dL   Creatinine, Ser 1.11 0.61 - 1.24 mg/dL   Calcium 9.5 8.9 - 10.3 mg/dL   Total Protein 6.9 6.5 - 8.1 g/dL   Albumin 4.0 3.5 - 5.0 g/dL   AST 34 15 - 41 U/L   ALT 56 17 - 63 U/L   Alkaline Phosphatase 82 38 - 126 U/L   Total Bilirubin 0.7 0.3 - 1.2 mg/dL   GFR calc non Af Amer >60 >60 mL/min   GFR calc Af Amer >60 >60 mL/min    Comment: (NOTE) The eGFR has been calculated using the CKD EPI equation. This calculation has not been validated in all clinical situations. eGFR's persistently <60 mL/min signify possible Chronic Kidney Disease.    Anion gap 10 5 - 15  I-stat troponin, ED     Status: None   Collection Time: 03/10/17  4:00 PM  Result Value Ref Range   Troponin i, poc 0.00 0.00 - 0.08 ng/mL   Comment 3            Comment: Due to the release kinetics of cTnI, a negative result within the first hours of the onset of symptoms does not rule out myocardial infarction with certainty. If myocardial infarction is still suspected, repeat the test at appropriate intervals.   I-Stat Chem 8, ED     Status: Abnormal   Collection Time: 03/10/17  4:02 PM  Result Value Ref Range   Sodium 140 135 - 145 mmol/L   Potassium 3.7 3.5 - 5.1 mmol/L   Chloride 101 101 - 111 mmol/L   BUN 17 6 - 20 mg/dL   Creatinine, Ser 1.00 0.61 - 1.24 mg/dL   Glucose, Bld 147 (H) 65 - 99 mg/dL   Calcium, Ion 1.16 1.15 - 1.40 mmol/L   TCO2 28 0 - 100 mmol/L   Hemoglobin 15.3 13.0 - 17.0 g/dL   HCT 45.0 39.0 - 52.0 %   Ct Head Wo Contrast  Result Date: 03/10/2017 CLINICAL DATA:  71 year old male with slurred speech and cognitive changes for 2 weeks. Deep brain stimulator placement in January this year. EXAM: CT HEAD WITHOUT CONTRAST TECHNIQUE: Contiguous axial images were obtained from the base of the skull through the vertex without intravenous contrast. COMPARISON:  Postoperative brain MRI 10/07/2016. Preoperative CT and MRI a 09/30/2016 and earlier. FINDINGS: Brain: In the central  left frontal lobe white matter  there is a new 18-21 mm diameter cystic appearing hypodense lesion located along the dorsal course of the left deep brain stimulator lead (series 3, image 21 and series 5, image 33. There is regional white matter hypodensity in a pattern resembling vasogenic edema. At the level of the left corona radiata of the edema surrounds the left side electrode. The edema extends nearly to the subcortical white matter of the left superior frontal gyrus, and tracks into the left deep white matter capsules, but does not extend to the left thalamus. There is mild regional mass effect including partial effacement of the left lateral ventricle. No midline shift. There were no signal abnormalities along the course of the left lead at the time of the postoperative brain MRI. No adverse features identified along the right side deep brain stimulator lead. Normal gray-white matter differentiation in the right hemisphere. No acute intracranial hemorrhage. No ventriculomegaly. No acute cortically based infarct identified. Vascular: No suspicious intracranial vascular hyperdensity. Skull: Vertex burr holes have an expected appearance. No other osseous abnormality of the skull identified. Sinuses/Orbits: Visualized paranasal sinuses and mastoids are stable and well pneumatized. Other: Visualized orbit soft tissues are within normal limits. No adverse features identified along the course of the scalp electrodes tracking from the vertex along the posterior convexities. Redundant electrode loops incidentally noted at the scalp. IMPRESSION: 1. New 20 mm cystic lesion in the central left frontal lobe white matter with surrounding vasogenic edema and mild regional mass effect. This is along the course of the left deep brain stimulator lead, although nonspecific this constellation is suspicious for brain abscess. Query fever or other signs of infection. 2. Recommend Head CT with contrast to further characterize. If  the clinical and imaging constellation is equivocal, we can probably perform at least a limited MRI of the brain - pending direction from the device manufacturer. Electronically Signed   By: Genevie Ann M.D.   On: 03/10/2017 17:28    Pertinent items noted in HPI and remainder of comprehensive ROS otherwise negative.  Blood pressure (!) 143/69, pulse 61, temperature 98.2 F (36.8 C), resp. rate 17, SpO2 94 %.  Patient is awake and alert. He is oriented and reasonably appropriate. His speech is somewhat halting but his content is good. Examination his cranial nerve function reveals some trace weakness of his right face otherwise cranial nerve function normal. Motor examination the extremities reveals a trace pronator drift on the right side otherwise motor strength intact. Sensory examination nonfocal. Reflexes normal. Wounds well-healed. No evidence of erythema or swelling. No evidence of nuchal rigidity. Examination head ears eyes and throat is unremarkable otherwise. Neck is supple. Chest and abdomen are benign. Extremities free from injury deformity. Assessment/Plan CT scan consistent with left frontal abscess surrounding the patient's previously placed left-sided deep brain stimulator system. No evidence of hemorrhage. I discussed situation with patient. I believe are most prudent course of action is removal of his left-sided implant with aspiration of this abscess and institution of appropriate antibiotic coverage. I discussed the risks involved with surgery including but not limited to the risk of anesthesia, bleeding, further infection, need for reoperation, possible seizure, possible stroke, and non-benefit. The patient has been given the opportunity ask questions. He appears to understand. He agrees to proceed with surgery.  Clementine Soulliere A 03/10/2017, 8:43 PM

## 2017-03-10 NOTE — Anesthesia Preprocedure Evaluation (Signed)
Anesthesia Evaluation  Patient identified by MRN, date of birth, ID band Patient awake    Reviewed: Allergy & Precautions, NPO status , Patient's Chart, lab work & pertinent test results  History of Anesthesia Complications (+) PONV  Airway Mallampati: II  TM Distance: >3 FB Neck ROM: Full    Dental no notable dental hx.    Pulmonary neg pulmonary ROS, former smoker,    Pulmonary exam normal breath sounds clear to auscultation       Cardiovascular hypertension, Normal cardiovascular exam Rhythm:Regular Rate:Normal     Neuro/Psych Essential tremor negative psych ROS   GI/Hepatic negative GI ROS, Neg liver ROS,   Endo/Other  negative endocrine ROS  Renal/GU negative Renal ROS  negative genitourinary   Musculoskeletal negative musculoskeletal ROS (+)   Abdominal   Peds negative pediatric ROS (+)  Hematology negative hematology ROS (+)   Anesthesia Other Findings   Reproductive/Obstetrics negative OB ROS                             Anesthesia Physical Anesthesia Plan  ASA: II and emergent  Anesthesia Plan: General   Post-op Pain Management:    Induction: Intravenous  PONV Risk Score and Plan: 1 and Ondansetron, Dexamethasone, Propofol and Treatment may vary due to age or medical condition  Airway Management Planned: Oral ETT  Additional Equipment:   Intra-op Plan:   Post-operative Plan: Extubation in OR  Informed Consent: I have reviewed the patients History and Physical, chart, labs and discussed the procedure including the risks, benefits and alternatives for the proposed anesthesia with the patient or authorized representative who has indicated his/her understanding and acceptance.   Dental advisory given  Plan Discussed with: CRNA and Surgeon  Anesthesia Plan Comments:         Anesthesia Quick Evaluation

## 2017-03-10 NOTE — ED Notes (Signed)
Patient transported to CT 

## 2017-03-10 NOTE — Procedures (Signed)
DBS Programming was performed.    Total time spent programming was 10 minutes.  Device was confirmed to be on.  Soft start was confirmed to be on.  Impedences were checked and were within normal limits.  Battery was checked and was determined to be functioning normally and not near the end of life.  Final settings were as follows:  Left brain electrode:     2-3+           ; Amplitude  2.5   V   ; Pulse width 90 microseconds;   Frequency   150   Hz.  Right brain electrode:     2-1+          ; Amplitude   2.7  V ;  Pulse width 90  microseconds;  Frequency   150    Hz.

## 2017-03-10 NOTE — ED Notes (Addendum)
Pt was sent by his doctor due to concern about patient possibly have had a stroke. Pt has a deep brain stimulator for tremors and has been having right leg numbness and trouble getting his words out for weeks now.   If patient needs MRI, please call Mason Jim at [phone] to have his [employer] brain stimulator turned off.

## 2017-03-10 NOTE — ED Notes (Signed)
Vitals rechecked when he returned from CT.

## 2017-03-10 NOTE — ED Provider Notes (Signed)
[place] DEPT Provider Note   CSN: [id number] Arrival date & time: 03/10/17  1538     History   Chief Complaint Chief Complaint  Patient presents with  . Stroke Symptoms    HPI Carlos Hunt is a 71 y.o. male.  HPI  71 y.o. male with a hx of Essential Tremor presents to the Emergency Department today from Neurology office due to stroke like symptoms. Noted right leg numbness as well as aphasia "for several weeks." Seen by [facility] (Dr. Carles Collet) for essential tremor for second opinion. Seen by [facility] ([name]) since 03-02-14 for same. Tremor began slowly in 2007. Tremor both hands. Pt has Medtronic Brain Stimulator that is compatible for MRI scan. Pt called on 02-25-17 due to speech issues as well as tremor. Pt states this was slow in onset. Notes headache and speech issues. Headache noted on right aspect around occiput. Denies numbness. Notes drooling. No trouble swallowing. Pt states that it feels like he his dragging his right leg. Denies headaches currently. No N/V. No fevers. No CP/SOB/ABD pain. No other symptoms noted.    Past Medical History:  Diagnosis Date  . Blood in stool   . Erectile dysfunction   . Essential tremor   . GERD (gastroesophageal reflux disease)   . Hx of colonic polyps   . Hypertension   . Pain in left knee   . Pain, joint, shoulder   . PONV (postoperative nausea and vomiting)   . Seasonal allergies   . Testicular hypofunction   . Tobacco use disorder   . Tremor   . Unspecified vitamin D deficiency     Patient Active Problem List   Diagnosis Date Noted  . Pain in the chest 06/05/2015  . Sinus bradycardia 06/05/2015  . Essential tremor 08/07/2014  . Essential hypertension, benign 02/14/2013    Past Surgical History:  Procedure Laterality Date  . COLONOSCOPY  09/09/2011   normal.  Previous polyps.  [name]. Repeat 5 years.  . COLONOSCOPY N/A 07/18/2016   Procedure: COLONOSCOPY;  Surgeon: Manya Silvas, MD;  Location:  [facility] ENDOSCOPY;  Service: Endoscopy;  Laterality: N/A;  . HAND SURGERY Left 2011   trauma  . PULSE GENERATOR IMPLANT Bilateral 10/23/2016   Procedure: BILATERAL IMPLANTABLE PULSE GENERATOR;  Surgeon: Erline Levine, MD;  Location: [place];  Service: Neurosurgery;  Laterality: Bilateral;  BILATERAL IMPLANTABLE PULSE GENERATOR  . SUBTHALAMIC STIMULATOR INSERTION Bilateral 10/07/2016   Procedure: BILATERAL DEEP BRAIN STIMULATOR PLACEMENT;  Surgeon: Erline Levine, MD;  Location: [place];  Service: Neurosurgery;  Laterality: Bilateral;  BILATERAL DEEP BRAIN STIMULATOR PLACEMENT       Home Medications    Prior to Admission medications   Medication Sig Start Date End Date Taking? Authorizing Provider  aspirin 325 MG tablet Take 325 mg by mouth daily.    [provider]  cholecalciferol (VITAMIN D) 1000 units tablet Take 2,000 Units by mouth daily.    [provider]  Cyanocobalamin (VITAMIN B12 PO) Take 3,000 mcg by mouth daily. Reported on 08/27/2015    [provider]  Echinacea 400 MG CAPS Take 400 mg by mouth daily.    [provider]  GLUCOSAMINE-CHONDROIT-MSM-C-MN PO Take 1 tablet by mouth daily.     [provider]  hydrochlorothiazide (HYDRODIURIL) 25 MG tablet Take 1 tablet (25 mg total) by mouth daily. 12/03/16   Wardell Honour, MD  lisinopril (PRINIVIL,ZESTRIL) 10 MG tablet Take 1 tablet (10 mg total) by mouth daily. 12/03/16  Wardell Honour, MD  Multiple Vitamins-Minerals (CENTRUM PO) Take 1 tablet by mouth daily.     [provider]  Omega-3 Fatty Acids (ULTRA OMEGA 3 PO) Take 690 mg by mouth 2 (two) times daily.     [provider]  Papaya TABS Take 2 tablets by mouth daily as needed (heartburn).    [provider]  Tetrahydroz-Glyc-Hyprom-PEG (VISINE MAXIMUM REDNESS RELIEF OP) Apply 1 drop to eye daily as needed (redness).    [provider]    Family History Family History  Problem Relation Age of Onset    . Dementia Mother   . Parkinson's disease Father   . Cancer Father 58       prostate  . Heart disease Father 12       CAD/CABG  . Hypertension Father   . Hyperlipidemia Father   . Parkinsonism Father   . Parkinson's disease Brother   . Diabetes Sister     Social History Social History  Substance Use Topics  . Smoking status: Former Smoker    Packs/day: 3.00    Years: 10.00    Types: Cigarettes    Quit date: 09/23/1970  . Smokeless tobacco: Never Used     Comment: quit in the 1970's  . Alcohol use 0.0 oz/week     Comment: occasional; once every 6 months     Allergies   Procaine   Review of Systems Review of Systems ROS reviewed and all are negative for acute change except as noted in the HPI.  Physical Exam Updated Vital Signs BP (!) 143/69   Pulse 61   Temp 98.2 F (36.8 C)   Resp 17   SpO2 94%   Physical Exam  Constitutional: He is oriented to person, place, and time. Vital signs are normal. He appears well-developed and well-nourished. No distress.  HENT:  Head: Normocephalic and atraumatic.  Right Ear: Hearing, tympanic membrane, external ear and ear canal normal.  Left Ear: Hearing, tympanic membrane, external ear and ear canal normal.  Nose: Nose normal.  Mouth/Throat: Uvula is midline, oropharynx is clear and moist and mucous membranes are normal. No trismus in the jaw. No oropharyngeal exudate, posterior oropharyngeal erythema or tonsillar abscesses.  Eyes: Conjunctivae and EOM are normal. Pupils are equal, round, and reactive to light.  Neck: Normal range of motion. Neck supple. No tracheal deviation present.  Cardiovascular: Normal rate, regular rhythm, S1 normal, S2 normal, normal heart sounds, intact distal pulses and normal pulses.   Pulmonary/Chest: Effort normal and breath sounds normal. No respiratory distress. He has no decreased breath sounds. He has no wheezes. He has no rhonchi. He has no rales.  Abdominal: Normal appearance and bowel  sounds are normal. There is no tenderness.  Musculoskeletal: Normal range of motion.  Neurological: He is alert and oriented to person, place, and time. He has normal strength. No cranial nerve deficit or sensory deficit.  Cranial Nerves:  II: Pupils equal, round, reactive to light III,IV, VI: ptosis not present, extra-ocular motions intact bilaterally  V,VII: smile symmetric, facial light touch sensation equal VIII: hearing grossly normal bilaterally  IX,X: midline uvula rise  XI: bilateral shoulder shrug equal and strong XII: midline tongue extension Negative finger to nose Negative pronator drift Noted expressive aphasia with word finding diffuclties  Skin: Skin is warm and dry.  Psychiatric: He has a normal mood and affect. His speech is normal and behavior is normal. Thought content normal.   ED Treatments / Results  Labs (all  labs ordered are listed, but only abnormal results are displayed) Labs Reviewed  COMPREHENSIVE METABOLIC PANEL - Abnormal; Notable for the following:       Result Value   Glucose, Bld 153 (*)    All other components within normal limits  I-STAT CHEM 8, ED - Abnormal; Notable for the following:    Glucose, Bld 147 (*)    All other components within normal limits  PROTIME-INR  APTT  CBC  DIFFERENTIAL  I-STAT TROPOININ, ED  CBG MONITORING, ED    EKG  EKG Interpretation  Date/Time:  Tuesday March 10 2017 15:47:21 EDT Ventricular Rate:  67 PR Interval:  144 QRS Duration: 86 QT Interval:  392 QTC Calculation: 414 R Axis:   57 Text Interpretation:  Normal sinus rhythm Normal ECG No significant change since last tracing Confirmed by Wandra Arthurs [phone]) on 03/10/2017 4:59:43 PM      Radiology Ct Head Wo Contrast  Result Date: 03/10/2017 CLINICAL DATA:  71 year old male with slurred speech and cognitive changes for 2 weeks. Deep brain stimulator placement in January this year. EXAM: CT HEAD WITHOUT CONTRAST TECHNIQUE: Contiguous axial images were  obtained from the base of the skull through the vertex without intravenous contrast. COMPARISON:  Postoperative brain MRI 10/07/2016. Preoperative CT and MRI a 09/30/2016 and earlier. FINDINGS: Brain: In the central left frontal lobe white matter there is a new 18-21 mm diameter cystic appearing hypodense lesion located along the dorsal course of the left deep brain stimulator lead (series 3, image 21 and series 5, image 33. There is regional white matter hypodensity in a pattern resembling vasogenic edema. At the level of the left corona radiata of the edema surrounds the left side electrode. The edema extends nearly to the subcortical white matter of the left superior frontal gyrus, and tracks into the left deep white matter capsules, but does not extend to the left thalamus. There is mild regional mass effect including partial effacement of the left lateral ventricle. No midline shift. There were no signal abnormalities along the course of the left lead at the time of the postoperative brain MRI. No adverse features identified along the right side deep brain stimulator lead. Normal gray-white matter differentiation in the right hemisphere. No acute intracranial hemorrhage. No ventriculomegaly. No acute cortically based infarct identified. Vascular: No suspicious intracranial vascular hyperdensity. Skull: Vertex burr holes have an expected appearance. No other osseous abnormality of the skull identified. Sinuses/Orbits: Visualized paranasal sinuses and mastoids are stable and well pneumatized. Other: Visualized orbit soft tissues are within normal limits. No adverse features identified along the course of the scalp electrodes tracking from the vertex along the posterior convexities. Redundant electrode loops incidentally noted at the scalp. IMPRESSION: 1. New 20 mm cystic lesion in the central left frontal lobe white matter with surrounding vasogenic edema and mild regional mass effect. This is along the course of  the left deep brain stimulator lead, although nonspecific this constellation is suspicious for brain abscess. Query fever or other signs of infection. 2. Recommend Head CT with contrast to further characterize. If the clinical and imaging constellation is equivocal, we can probably perform at least a limited MRI of the brain - pending direction from the device manufacturer. Electronically Signed   By: Genevie Ann M.D.   On: 03/10/2017 17:28   Procedures Procedures (including critical care time) CRITICAL CARE Performed by: Ozella Rocks   Total critical care time: 40 minutes  Critical care time was exclusive of separately billable  procedures and treating other patients.  Critical care was necessary to treat or prevent imminent or life-threatening deterioration.  Critical care was time spent personally by me on the following activities: development of treatment plan with patient and/or surrogate as well as nursing, discussions with consultants, evaluation of patient's response to treatment, examination of patient, obtaining history from patient or surrogate, ordering and performing treatments and interventions, ordering and review of laboratory studies, ordering and review of radiographic studies, pulse oximetry and re-evaluation of patient's condition.   Medications Ordered in ED Medications - No data to display   Initial Impression / Assessment and Plan / ED Course  I have reviewed the triage vital signs and the nursing notes.  Pertinent labs & imaging results that were available during my care of the patient were reviewed by me and considered in my medical decision making (see chart for details).  Final Clinical Impressions(s) / ED Diagnoses  {I have reviewed and evaluated the relevant laboratory values. {I have reviewed and evaluated the relevant imaging studies.  {I have reviewed the relevant previous healthcare records.  {I obtained HPI from historian. {Patient discussed with supervising  physician.  ED Course:  Assessment: Pt is a 71 y.o. male  hx of Essential Tremor presents to the Emergency Department today from Neurology office due to stroke like symptoms. Noted right leg numbness as well as aphasia "for several weeks." Seen by [facility] (Dr. Carles Collet) for essential tremor for second opinion. Seen by [facility] ([name]) since 03-02-14 for same. Tremor began slowly in 2007. Tremor both hands. Pt has Medtronic Brain Stimulator that is compatible for MRI scan. Pt called on 02-25-17 due to speech issues as well as tremor. Pt states this was slow in onset. Notes headache and speech issues. Headache noted on right aspect around occiput. Denies numbness. Notes drooling. No trouble swallowing. Pt states that it feels like he his dragging his right leg. Denies headaches currently. No N/V. No fevers. No CP/SOB/ABD pain.  On exam, pt in NAD. Nontoxic/nonseptic appearing. VSS. Afebrile. Lungs CTA. Heart RRR. Abdomen nontender soft. CN evaluated and unremarkable. No unilateral weakness or decrease in grip strength. Noted expressive aphasia. Labs unremarkable. CT Non contrast shows 47m cystic lesion in central left frontal lobe with surrounding vasogenic edema and mild regional mass effect. Suspicious for brain abscess. Stimulator was placed 10-07-16 by Dr. SVertell Limber Consult to Neurosurgery (Dr. PAnnette Stable recommended MRI with contrast. Defer to neurosurgery for ABX if needed. Will see in ED  Disposition/Plan:  Pending MRI Brain Admit to Neurosurgery  Supervising Physician YDrenda Freeze MD  Final diagnoses:  Stroke-like symptoms  Brain abscess    New Prescriptions New Prescriptions   No medications on file       MConni Slipper07/03/18 2054    YDrenda Freeze MD 03/12/17 2016

## 2017-03-10 NOTE — Anesthesia Procedure Notes (Signed)
Procedure Name: Intubation Date/Time: 03/10/2017 11:33 PM Performed by: Rebekah Chesterfield L Pre-anesthesia Checklist: Patient identified, Emergency Drugs available, Suction available and Patient being monitored Patient Re-evaluated:Patient Re-evaluated prior to inductionOxygen Delivery Method: Circle System Utilized Preoxygenation: Pre-oxygenation with 100% oxygen Intubation Type: IV induction Ventilation: Mask ventilation without difficulty Laryngoscope Size: Mac and 4 Grade View: Grade I Tube type: Oral Tube size: 8.0 mm Number of attempts: 1 Airway Equipment and Method: Stylet and Oral airway Placement Confirmation: ETT inserted through vocal cords under direct vision,  positive ETCO2 and breath sounds checked- equal and bilateral Secured at: 22 cm Tube secured with: Tape Dental Injury: Teeth and Oropharynx as per pre-operative assessment

## 2017-03-10 NOTE — ED Triage Notes (Signed)
Pt sent here by his PCP for stroke like symptoms. He has deep brain stimulator for tremors but has had increased headache and aphasia since the beginning of June. Pt alert and oriented but has some trouble communicating.

## 2017-03-10 NOTE — Progress Notes (Signed)
Carlos Hunt was seen today in the movement disorders clinic for neurologic consultation at the request of [name], MD.  I have reviewed numerous prior records made available to me.  This patient is accompanied in the office by his spouse who supplements the history.  The patient presents today for a second opinion regarding essential tremor.  He has been seeing [name] in [place] since 03/02/2014 and his last appointment with him was on November 05, 2015.  The patient reports that tremor began slowly, in approximately 2007.  Tremor started in both hands. Pt is R hand dominant but both hands shake. The patient notices tremor most when the hand is in use.  There is a family history of tremor in the patient's father, daughter, and brother, but his brother and father do have a known diagnosis of Parkinson's disease.  On his first visit with [name], the patient was started on primidone, 50 mg twice a day for essential tremor.  At his follow-up 2 months later, the patient reported that tremor was better, although he felt that the medication was causing significant dreams.  He continued on the medication until his February, 2017 follow-up and his primidone was increased to 125 mg twice a day.  Jaw tremor was noted that visit.  The patient called the neurology office a few weeks later because of anxiety and just "felt weird" and primidone was reduced to 75 mg twice a day.  04/24/16 update:  The patient was up today, accompanied by his wife who supplements the history.  Records were reviewed and are made available to me.  The patient had a dat scan at [facility] since our last visit.  There was mild asymmetry of update in caudates, less on the left than the right of uncertain etiology and could be from prominent perivascular space or from remote lacute but said not typical for PD or any parkinsonian state.  He had an MRI of the brain in 2016 that was unremarkable.  09/30/16 update:  Patient follows up  today, accompanied by his wife who supplements the history.  The patient had fiducials placed today in anticipation of DBS surgery for essential tremor.  Surgery is scheduled for one week from today, 10/07/2016.  The patient reports that he is ready for surgery.  He is currently on primidone, 50 mg bid.  Doesn't think that it is helping.  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    11/06/16 update:  Pt f/u today.  He underwent bilateral VIM DBS on 10/07/16.  He had IPG placed on 10/23/16 and did stay one night overnight.  Pt reports that he has recovered well.  No falls since our last visit.  He is no longer on the primidone.  11/27/16 update:  Patient follows up today.  His device was activated about 3 weeks ago.  The patient reports that he has done well.  He denies side effects with the device.  In regards to tremor, he states that he has minor tremor in the dominant hand.  No falls.  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  Something which was taking him 30 minutes is now taking him under 5 minutes.  He asks me to look at a place on the scalp.  03/10/17 update:  Pt seen today with wife who supplements the history.  He called 02/25/17 and said that one week prior he started having tremor and having speech issues.  Pt thinks that the onset was slow but wife thinks that it was acute.  Pt states that it was subacute but didn't tell anyone.  He is having lots of speech trouble, trouble thinking and headaches at the R occiput.  He has trouble describing the headache even when given choices of descriptors.  "I don't think that I am numb but I might be."  Unsure where.  Admits to drooling that is new.  No troubles swallowing.  Feels that he is dragging the right leg.  Tremor on the right hand is a bit worse.  He thought that it was related to DBS but told by our office to have evaluated but has not yet done so.    Neuroimaging has previously been performed.  It is available for my  review today.  MRI was done 10/02/14 and was unremarkable.    ALLERGIES:   Allergies  Allergen Reactions  . Procaine Nausea And Vomiting    NOVOCAINE    CURRENT MEDICATIONS:  Outpatient Encounter Prescriptions as of 03/10/2017  Medication Sig  . aspirin 325 MG tablet Take 325 mg by mouth daily.  . cholecalciferol (VITAMIN D) 1000 units tablet Take 2,000 Units by mouth daily.  . Cyanocobalamin (VITAMIN B12 PO) Take 3,000 mcg by mouth daily. Reported on 08/27/2015  . Echinacea 400 MG CAPS Take 400 mg by mouth daily.  Marland Kitchen GLUCOSAMINE-CHONDROIT-MSM-C-MN PO Take 1 tablet by mouth daily.   . hydrochlorothiazide (HYDRODIURIL) 25 MG tablet Take 1 tablet (25 mg total) by mouth daily.  Marland Kitchen lisinopril (PRINIVIL,ZESTRIL) 10 MG tablet Take 1 tablet (10 mg total) by mouth daily.  . Multiple Vitamins-Minerals (CENTRUM PO) Take 1 tablet by mouth daily.   . Omega-3 Fatty Acids (ULTRA OMEGA 3 PO) Take 690 mg by mouth 2 (two) times daily.   . Papaya TABS Take 2 tablets by mouth daily as needed (heartburn).  . Tetrahydroz-Glyc-Hyprom-PEG (VISINE MAXIMUM REDNESS RELIEF OP) Apply 1 drop to eye daily as needed (redness).   No facility-administered encounter medications on file as of 03/10/2017.     PAST MEDICAL HISTORY:   Past Medical History:  Diagnosis Date  . Blood in stool   . Erectile dysfunction   . Essential tremor   . GERD (gastroesophageal reflux disease)   . Hx of colonic polyps   . Hypertension   . Pain in left knee   . Pain, joint, shoulder   . PONV (postoperative nausea and vomiting)   . Seasonal allergies   . Testicular hypofunction   . Tobacco use disorder   . Tremor   . Unspecified vitamin D deficiency     PAST SURGICAL HISTORY:   Past Surgical History:  Procedure Laterality Date  . COLONOSCOPY  09/09/2011   normal.  Previous polyps.  [name]. Repeat 5 years.  . COLONOSCOPY N/A 07/18/2016   Procedure: COLONOSCOPY;  Surgeon: Manya Silvas, MD;  Location: [facility] ENDOSCOPY;  Service:  Endoscopy;  Laterality: N/A;  . HAND SURGERY Left 2011   trauma  . PULSE GENERATOR IMPLANT Bilateral 10/23/2016   Procedure: BILATERAL IMPLANTABLE PULSE GENERATOR;  Surgeon: Erline Levine, MD;  Location: [place];  Service: Neurosurgery;  Laterality: Bilateral;  BILATERAL IMPLANTABLE PULSE GENERATOR  . SUBTHALAMIC STIMULATOR INSERTION Bilateral 10/07/2016   Procedure: BILATERAL DEEP BRAIN STIMULATOR PLACEMENT;  Surgeon: Erline Levine, MD;  Location: [place];  Service: Neurosurgery;  Laterality: Bilateral;  BILATERAL DEEP BRAIN STIMULATOR PLACEMENT    SOCIAL HISTORY:   Social History   Social History  . Marital  status: Married    Spouse name: N/A  . Number of children: 4  . Years of education: N/A   Occupational History  . building maintenance     [employer]   Social History Main Topics  . Smoking status: Former Smoker    Packs/day: 3.00    Years: 10.00    Types: Cigarettes    Quit date: 09/23/1970  . Smokeless tobacco: Never Used     Comment: quit in the 1970's  . Alcohol use 0.0 oz/week     Comment: occasional; once every 6 months  . Drug use: No  . Sexual activity: Yes   Other Topics Concern  . Not on file   Social History Narrative   Marital status: married x 45 years.      Children: 2 daughters, 2 sons; 6 grandchildren.      Lives: with wife.        Employment:  [employer] x 13 years; moderately happy.  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      Tobacco: quit 42 years ago; smoked x 10 years.       Alcohol: socially; weekends.  Rarely.      Drugs: none      Exercise:  None; work is physical.        Seatbelt: 100%      Sunscreen: rare sun exposure      Guns:  4 gun; unloaded, not stored in locked cabinet.       Smoke alarm and carbon monoxide detector in the home.      Caffeine use: consumes a minimal amount   Living Will: patient does NOT have Living Will; Does NOT have HCPOA; getting ready to work on it   Organ Donor: NO          FAMILY HISTORY:     Family Status  Relation Status  . Mother Deceased at age 69       dementia  . Father Deceased at age 44       PD, heart disease  . Sister Alive       unknown  . Brother Deceased at age 51       Parkinson's disease  . Sister Alive       unknown  . Brother Alive       unknown  . Brother Alive       unknown  . Daughter Alive       tremor  . Son Alive       healthy    ROS:  A complete 10 system review of systems was obtained and was unremarkable apart from what is mentioned above.  PHYSICAL EXAMINATION:    VITALS:   Vitals:   03/10/17 1444  BP: 140/64  Pulse: 64  SpO2: 96%  Weight: 212 lb (96.2 kg)  Height: 5' 11"  (1.803 m)    GEN:  The patient appears stated age and is in NAD.  Pt diaphoretic HEENT:  Normocephalic, atraumatic.  The mucous membranes are moist. The superficial temporal arteries are without ropiness or tenderness. CV:  RRR Lungs:  CTAB Neck/HEME:  There are no carotid bruits bilaterally. Skin:  Battery sites and scalp incisions are well healed.  There is nothing on the scalp that looks infected.  There is a Lofgren scab, but nothing erythematous.  Neurological examination:  Orientation: The patient is alert and oriented x3.  Cranial nerves: There is good facial symmetry.  No facial asymmetry.  The visual fields are full to  confrontational testing. The speech is non-fluent with dysphasia but it is clear. Soft palate rises symmetrically and there is no tongue deviation. Hearing is intact to conversational tone. Sensation: Sensation is intact to light and pinprick throughout (facial, trunk, extremities).  There is no extinction with double simultaneous stimulation. There is no sensory dermatomal level identified. Motor: Strength is 5/5 in the bilateral upper and lower extremities.   Shoulder shrug is equal and symmetric.  There is no pronator drift.   Movement examination: Tone: There is normal tone in the bilateral upper extremities.  The tone in the lower  extremities is normal.  Abnormal movements:  He has rare tremor of the right hand. Coordination:  There is good RAMs. Gait and Station: The patient has no difficulty arising out of a deep-seated chair without the use of the hands. The patient's stride length is normal with just slight drag of the right leg.  The patient has a negative pull test.    DBS programming was turned off today and patient had same difficulty speaking.  Tremor returned in both hands and device was turned back on  ASSESSMENT/PLAN:  1.  Tremor  -He likely has long standing ET that is now manifesting as a resting tremor.   -The patient had a dat scan at [facility] since our last visit.  There was mild asymmetry of uptake in caudates, less on the left than the right of uncertain etiology and could be from prominent perivascular space or from remote lacute but said not typical for PD or any parkinsonian state.  He had an MRI of the brain in 2016 that was unremarkable.  -Patient underwent bilateral VIM DBS on 10/07/2016.  He had bilateral IPG placement on 10/23/2016.  Patient's DBS device was activated, 11/07/2016.  Likely needs some adjustment on L but too many acute issues to address today. 2.  Probable cerebral infarct  -patient having difficulty with word finding and feeling weak in the R leg.  Told him he needed to go to the ER.  Wife wanted to drive him.  He wanted to wait until Friday to go to the ER but ultimately wife and I convinced him to go today.  I called the ER and asked to speak to the dr.  Given intake nurse and gave detailed hx and number to [employer] rep.  His device is MRI compatible but will need turned off, placed into bipolar mode with special parameters for MRI to be done.  [employer] rep called by me and he will be available if the hospital contacts him. 3.  Will f/u post above issue.  Much greater than 50% of this visit was spent in counseling and coordinating care.  Total face to face time:  35 min

## 2017-03-11 ENCOUNTER — Encounter (HOSPITAL_COMMUNITY): Payer: Self-pay | Admitting: Neurosurgery

## 2017-03-11 DIAGNOSIS — G060 Intracranial abscess and granuloma: Secondary | ICD-10-CM | POA: Diagnosis not present

## 2017-03-11 DIAGNOSIS — K219 Gastro-esophageal reflux disease without esophagitis: Secondary | ICD-10-CM | POA: Diagnosis present

## 2017-03-11 DIAGNOSIS — Z888 Allergy status to other drugs, medicaments and biological substances status: Secondary | ICD-10-CM | POA: Diagnosis not present

## 2017-03-11 DIAGNOSIS — Z8249 Family history of ischemic heart disease and other diseases of the circulatory system: Secondary | ICD-10-CM | POA: Diagnosis not present

## 2017-03-11 DIAGNOSIS — G936 Cerebral edema: Secondary | ICD-10-CM | POA: Diagnosis present

## 2017-03-11 DIAGNOSIS — Y752 Prosthetic and other implants, materials and neurological devices associated with adverse incidents: Secondary | ICD-10-CM | POA: Diagnosis present

## 2017-03-11 DIAGNOSIS — Z833 Family history of diabetes mellitus: Secondary | ICD-10-CM | POA: Diagnosis not present

## 2017-03-11 DIAGNOSIS — Z79899 Other long term (current) drug therapy: Secondary | ICD-10-CM | POA: Diagnosis not present

## 2017-03-11 DIAGNOSIS — I10 Essential (primary) hypertension: Secondary | ICD-10-CM | POA: Diagnosis present

## 2017-03-11 DIAGNOSIS — I619 Nontraumatic intracerebral hemorrhage, unspecified: Secondary | ICD-10-CM | POA: Diagnosis not present

## 2017-03-11 DIAGNOSIS — Z820 Family history of epilepsy and other diseases of the nervous system: Secondary | ICD-10-CM | POA: Diagnosis not present

## 2017-03-11 DIAGNOSIS — E559 Vitamin D deficiency, unspecified: Secondary | ICD-10-CM | POA: Diagnosis present

## 2017-03-11 DIAGNOSIS — G250 Essential tremor: Secondary | ICD-10-CM | POA: Diagnosis present

## 2017-03-11 DIAGNOSIS — T85731A Infection and inflammatory reaction due to implanted electronic neurostimulator of brain, electrode (lead), initial encounter: Secondary | ICD-10-CM | POA: Diagnosis present

## 2017-03-11 DIAGNOSIS — Z87891 Personal history of nicotine dependence: Secondary | ICD-10-CM | POA: Diagnosis not present

## 2017-03-11 DIAGNOSIS — Z7982 Long term (current) use of aspirin: Secondary | ICD-10-CM | POA: Diagnosis not present

## 2017-03-11 DIAGNOSIS — R4781 Slurred speech: Secondary | ICD-10-CM | POA: Diagnosis not present

## 2017-03-11 DIAGNOSIS — Z8042 Family history of malignant neoplasm of prostate: Secondary | ICD-10-CM | POA: Diagnosis not present

## 2017-03-11 DIAGNOSIS — Z884 Allergy status to anesthetic agent status: Secondary | ICD-10-CM | POA: Diagnosis not present

## 2017-03-11 HISTORY — DX: Intracranial abscess and granuloma: G06.0

## 2017-03-11 LAB — CBC
HCT: 41.7 % (ref 39.0–52.0)
Hemoglobin: 14.1 g/dL (ref 13.0–17.0)
MCH: 31.1 pg (ref 26.0–34.0)
MCHC: 33.8 g/dL (ref 30.0–36.0)
MCV: 92.1 fL (ref 78.0–100.0)
Platelets: 261 10*3/uL (ref 150–400)
RBC: 4.53 MIL/uL (ref 4.22–5.81)
RDW: 13.5 % (ref 11.5–15.5)
WBC: 11.1 10*3/uL — ABNORMAL HIGH (ref 4.0–10.5)

## 2017-03-11 LAB — BASIC METABOLIC PANEL
Anion gap: 11 (ref 5–15)
BUN: 11 mg/dL (ref 6–20)
CO2: 25 mmol/L (ref 22–32)
Calcium: 8.9 mg/dL (ref 8.9–10.3)
Chloride: 102 mmol/L (ref 101–111)
Creatinine, Ser: 1.06 mg/dL (ref 0.61–1.24)
GFR calc Af Amer: >60 mL/min (ref >60)
GFR calc non Af Amer: >60 mL/min (ref >60)
Glucose, Bld: 124 mg/dL — ABNORMAL HIGH (ref 65–99)
Potassium: 3.6 mmol/L (ref 3.5–5.1)
Sodium: 138 mmol/L (ref 135–145)

## 2017-03-11 LAB — MRSA PCR SCREENING: MRSA by PCR: NEGATIVE

## 2017-03-11 MED ORDER — NALOXONE HCL 0.4 MG/ML IJ SOLN: 0.0800 mg | INTRAMUSCULAR | Status: DC | PRN

## 2017-03-11 MED ORDER — ONDANSETRON HCL 4 MG PO TABS: 4.0000 mg | ORAL_TABLET | ORAL | Status: DC | PRN

## 2017-03-11 MED ORDER — VITAMIN B-12 1000 MCG PO TABS
3000.0000 ug | ORAL_TABLET | Freq: Every day | ORAL | Status: DC
  Administered 2017-03-11 – 2017-03-13 (×3): 3000 ug via ORAL
  Filled 2017-03-11 (×4): qty 3

## 2017-03-11 MED ORDER — HYDROMORPHONE HCL 1 MG/ML IJ SOLN: 0.5000 mg | INTRAMUSCULAR | Status: DC | PRN

## 2017-03-11 MED ORDER — DEXTROSE 5 % IV SOLN
2.0000 g | INTRAVENOUS | Status: AC
  Administered 2017-03-11: 2 g via INTRAVENOUS
  Filled 2017-03-11: qty 2

## 2017-03-11 MED ORDER — ONDANSETRON HCL 4 MG/2ML IJ SOLN: 4.0000 mg | INTRAMUSCULAR | Status: DC | PRN

## 2017-03-11 MED ORDER — HYDROCHLOROTHIAZIDE 25 MG PO TABS
25.0000 mg | ORAL_TABLET | Freq: Every day | ORAL | Status: DC
  Administered 2017-03-11 – 2017-03-13 (×3): 25 mg via ORAL
  Filled 2017-03-11 (×3): qty 1

## 2017-03-11 MED ORDER — VANCOMYCIN HCL 10 G IV SOLR
1250.0000 mg | Freq: Two times a day (BID) | INTRAVENOUS | Status: DC
  Administered 2017-03-11 – 2017-03-13 (×5): 1250 mg via INTRAVENOUS
  Filled 2017-03-11 (×6): qty 1250

## 2017-03-11 MED ORDER — VITAMIN D 1000 UNITS PO TABS
2000.0000 [IU] | ORAL_TABLET | Freq: Every day | ORAL | Status: DC
  Administered 2017-03-11 – 2017-03-13 (×3): 2000 [IU] via ORAL
  Filled 2017-03-11 (×3): qty 2

## 2017-03-11 MED ORDER — SUGAMMADEX SODIUM 200 MG/2ML IV SOLN
INTRAVENOUS | Status: DC | PRN
  Administered 2017-03-11: 400 mg via INTRAVENOUS

## 2017-03-11 MED ORDER — PROMETHAZINE HCL 25 MG/ML IJ SOLN
INTRAMUSCULAR | Status: AC
  Filled 2017-03-11: qty 1

## 2017-03-11 MED ORDER — ONDANSETRON HCL 4 MG/2ML IJ SOLN
INTRAMUSCULAR | Status: DC | PRN
  Administered 2017-03-11: 4 mg via INTRAVENOUS

## 2017-03-11 MED ORDER — POLYVINYL ALCOHOL 1.4 % OP SOLN: 1.0000 [drp] | Freq: Every day | OPHTHALMIC | Status: DC | PRN

## 2017-03-11 MED ORDER — ECHINACEA 400 MG PO CAPS: 400.0000 mg | ORAL_CAPSULE | Freq: Every day | ORAL | Status: DC

## 2017-03-11 MED ORDER — PROMETHAZINE HCL 25 MG PO TABS: 12.5000 mg | ORAL_TABLET | ORAL | Status: DC | PRN

## 2017-03-11 MED ORDER — LISINOPRIL 10 MG PO TABS
10.0000 mg | ORAL_TABLET | Freq: Every day | ORAL | Status: DC
  Administered 2017-03-11 – 2017-03-13 (×3): 10 mg via ORAL
  Filled 2017-03-11 (×3): qty 1

## 2017-03-11 MED ORDER — VANCOMYCIN HCL 1000 MG IV SOLR
INTRAVENOUS | Status: DC | PRN
  Administered 2017-03-11: 1000 mg via INTRAVENOUS

## 2017-03-11 MED ORDER — ACETAMINOPHEN 650 MG RE SUPP: 650.0000 mg | RECTAL | Status: DC | PRN

## 2017-03-11 MED ORDER — SODIUM CHLORIDE 0.9 % IR SOLN
Status: DC | PRN
  Administered 2017-03-11: 1000 mL

## 2017-03-11 MED ORDER — SODIUM CHLORIDE 0.9 % IV SOLN
500.0000 mg | Freq: Two times a day (BID) | INTRAVENOUS | Status: DC
  Administered 2017-03-11 – 2017-03-13 (×6): 500 mg via INTRAVENOUS
  Filled 2017-03-11 (×7): qty 5

## 2017-03-11 MED ORDER — THROMBIN 20000 UNITS EX SOLR
CUTANEOUS | Status: DC | PRN
  Administered 2017-03-10: 20 mL

## 2017-03-11 MED ORDER — PANTOPRAZOLE SODIUM 40 MG IV SOLR
40.0000 mg | Freq: Every day | INTRAVENOUS | Status: DC
  Administered 2017-03-11 – 2017-03-12 (×3): 40 mg via INTRAVENOUS
  Filled 2017-03-11 (×3): qty 40

## 2017-03-11 MED ORDER — LABETALOL HCL 5 MG/ML IV SOLN: 10.0000 mg | INTRAVENOUS | Status: DC | PRN

## 2017-03-11 MED ORDER — HYDROMORPHONE HCL 1 MG/ML IJ SOLN: 0.2500 mg | INTRAMUSCULAR | Status: DC | PRN

## 2017-03-11 MED ORDER — BACITRACIN 50000 UNITS IM SOLR
INTRAMUSCULAR | Status: DC | PRN
  Administered 2017-03-10: 500 mL

## 2017-03-11 MED ORDER — ORAL CARE MOUTH RINSE: 15.0000 mL | Freq: Two times a day (BID) | OROMUCOSAL | Status: DC

## 2017-03-11 MED ORDER — DEXTROSE 5 % IV SOLN
2.0000 g | Freq: Two times a day (BID) | INTRAVENOUS | Status: DC
  Administered 2017-03-11 – 2017-03-12 (×3): 2 g via INTRAVENOUS
  Filled 2017-03-11 (×4): qty 2

## 2017-03-11 MED ORDER — LIDOCAINE-EPINEPHRINE 1 %-1:100000 IJ SOLN
INTRAMUSCULAR | Status: DC | PRN
  Administered 2017-03-10: 7 mL via INTRADERMAL

## 2017-03-11 MED ORDER — PROMETHAZINE HCL 25 MG/ML IJ SOLN
6.2500 mg | INTRAMUSCULAR | Status: DC | PRN
  Administered 2017-03-11: 12.5 mg via INTRAVENOUS

## 2017-03-11 MED ORDER — HYDROCODONE-ACETAMINOPHEN 5-325 MG PO TABS: 1.0000 | ORAL_TABLET | ORAL | Status: DC | PRN

## 2017-03-11 MED ORDER — ACETAMINOPHEN 325 MG PO TABS: 650.0000 mg | ORAL_TABLET | ORAL | Status: DC | PRN

## 2017-03-11 NOTE — Transfer of Care (Signed)
Immediate Anesthesia Transfer of Care Note  Patient: Carlos Hunt  Procedure(s) Performed: Procedure(s): REMOVAL DEEP BRAIN STIMULATOR AND ASPIRATION OF BRAIN ABSCESS (Left)  Patient Location: PACU  Anesthesia Type:General  Level of Consciousness: awake, alert , confused and responds to stimulation  Airway & Oxygen Therapy: Patient Spontanous Breathing  Post-op Assessment: Report given to RN, Post -op Vital signs reviewed and stable and Patient moving all extremities  Post vital signs: Reviewed and stable  Last Vitals:  Vitals:   03/10/17 2130 03/10/17 2215  BP: (!) 151/71 (!) 154/68  Pulse: 61 61  Resp: 16 19  Temp:      Last Pain:  Vitals:   03/10/17 1544  TempSrc:   PainSc: 2          Complications: No apparent anesthesia complications

## 2017-03-11 NOTE — Op Note (Signed)
Date of procedure: 03/11/2017  Date of dictation: Same  Service: Neurosurgery  Preoperative diagnosis: Infected deep brain stimulator with left frontal abscess  Postoperative diagnosis: Same  Procedure Name: Removal of a left-sided deep brain stimulator  Aspiration of brain abscess  Surgeon:Amesha Bailey A.Chrishonda Hesch, M.D.  Asst. Surgeon: None  Anesthesia: General  Indication: 71 year old male status post bilateral deep brain stimulator placement for essential tremor area did surgery done in January. Patient presents now with 4 week history of increasing speech difficulty and some right-sided weakness. Workup demonstrates evidence of a cystic lesion with associated edema around the catheter consistent with brain abscess. Patient presents now for reexploration of his DBS system and aspiration of abscess.  Operative note: After induction of anesthesia, patient position supine. Patient's left scalp and left upper chest were prepped and draped sterilely. Curvilinear incision was made in his left frontal region. This carried down sharply to the paracranium. Coiled wire was dissected free and cut area did the bur hole cover was removed. The DBS wire was removed and sent for culture. A ventricular catheter was passed through the wire track and gentle aspiration was performed. This produced approximately 2 mL of necrotic appearing white matter but no frank pus. Wound was irrigated area did tension placed to the left upper chest. Previous incision was reopened. The battery pack was dissected free and removed. All wires between the 2 wounds were removed. Both wounds were irrigated and closed in a typical fashion. Antibiotics were given after implant removal. Oriented procedure well and returns to the recovery room postop.

## 2017-03-11 NOTE — Brief Op Note (Signed)
03/10/2017 - 03/11/2017  12:22 AM  PATIENT:  Carlos Hunt  71 y.o. male  PRE-OPERATIVE DIAGNOSIS:  BRAIN ABSCESS AND INFECTED BRAIN STIMULATOR  POST-OPERATIVE DIAGNOSIS:  BRAIN ABSCESS AND INFECTED BRAIN STIMULATOR  PROCEDURE:  Procedure(s): REMOVAL DEEP BRAIN STIMULATOR AND ASPIRATION OF BRAIN ABSCESS (Left)  SURGEON:  Surgeon(s) and Role:    * Quetzali Heinle, Mallie Mussel, MD - Primary  PHYSICIAN ASSISTANT:   ASSISTANTS:    ANESTHESIA:   general  EBL:  Total I/O In: 600 [I.V.:600] Out: -   BLOOD ADMINISTERED:none  DRAINS: none   LOCAL MEDICATIONS USED:  LIDOCAINE   SPECIMEN:  No Specimen  DISPOSITION OF SPECIMEN:  N/A  COUNTS:  YES  TOURNIQUET:  * No tourniquets in log *  DICTATION: .Dragon Dictation  PLAN OF CARE: Admit to inpatient   PATIENT DISPOSITION:  PACU - hemodynamically stable.   Delay start of Pharmacological VTE agent (>24hrs) due to surgical blood loss or risk of bleeding: yes

## 2017-03-11 NOTE — Progress Notes (Signed)
Postop day 1. Patient denies headache. States speech is a little easier. No new numbness or weakness.  Afebrile. Vitals are stable. He is awake and alert. He is oriented and appropriate. His speech is slow but reasonably fluent. Motor examination 5/5 bilaterally. Tremor in his right upper extremity essentially unchanged. Wounds clean and dry. Chest and abdomen benign.  Gram stain with multiple WBCs but no organisms identified.  Status post infected deep brain stimulator. Continue broad-spectrum antibiotics. Await cultures.

## 2017-03-11 NOTE — Progress Notes (Signed)
Pharmacy Antibiotic Note  FRUTOSO DIMARE is a 71 y.o. male admitted on 03/10/2017 with brain abscess. Pharmacy has been consulted for Vancomycin dosing. Pt is s/p OR for removal of infected deep brain stimulator (with abscess). WBC WNL. Renal function good. Received Vancomycin 1000 mg IV x 1 in OR.   Plan: Vancomycin 1250 mg IV q12h Ceftriaxone 2g IV q12h per MD Trend WBC, temp, renal function  F/U infectious work-up Drug levels as indicated   Height: 5' 11"  (180.3 cm) Weight: 208 lb 1.8 oz (94.4 kg) IBW/kg (Calculated) : 75.3  Temp (24hrs), Avg:97.7 F (36.5 C), Min:97.3 F (36.3 C), Max:98.2 F (36.8 C)   Recent Labs Lab 03/10/17 1549 03/10/17 1602  WBC 10.0  --   CREATININE 1.11 1.00    Estimated Creatinine Clearance: 79.4 mL/min (by C-G formula based on SCr of 1 mg/dL).    Allergies  Allergen Reactions  . Procaine Nausea And Vomiting    NOVOCAINE   Narda Bonds 03/11/2017 2:34 AM

## 2017-03-12 ENCOUNTER — Telehealth: Payer: Self-pay | Admitting: Neurology

## 2017-03-12 ENCOUNTER — Inpatient Hospital Stay (HOSPITAL_COMMUNITY): Payer: Medicare Other

## 2017-03-12 DIAGNOSIS — Z8249 Family history of ischemic heart disease and other diseases of the circulatory system: Secondary | ICD-10-CM

## 2017-03-12 DIAGNOSIS — Z79899 Other long term (current) drug therapy: Secondary | ICD-10-CM

## 2017-03-12 DIAGNOSIS — Z87891 Personal history of nicotine dependence: Secondary | ICD-10-CM

## 2017-03-12 DIAGNOSIS — G060 Intracranial abscess and granuloma: Secondary | ICD-10-CM

## 2017-03-12 DIAGNOSIS — Z8042 Family history of malignant neoplasm of prostate: Secondary | ICD-10-CM

## 2017-03-12 DIAGNOSIS — Z833 Family history of diabetes mellitus: Secondary | ICD-10-CM

## 2017-03-12 DIAGNOSIS — Z820 Family history of epilepsy and other diseases of the nervous system: Secondary | ICD-10-CM

## 2017-03-12 DIAGNOSIS — Z888 Allergy status to other drugs, medicaments and biological substances status: Secondary | ICD-10-CM

## 2017-03-12 MED ORDER — IOPAMIDOL (ISOVUE-300) INJECTION 61%
INTRAVENOUS | Status: AC
  Administered 2017-03-12: 75 mL
  Filled 2017-03-12: qty 75

## 2017-03-12 MED ORDER — DEXTROSE 5 % IV SOLN
2.0000 g | Freq: Two times a day (BID) | INTRAVENOUS | Status: DC
  Administered 2017-03-12 – 2017-03-13 (×2): 2 g via INTRAVENOUS
  Filled 2017-03-12 (×3): qty 2

## 2017-03-12 NOTE — Consult Note (Signed)
[facility] for Infectious Disease    Date of Admission:  03/10/2017           Day 2 vancomycin        Day 2 ceftriaxone       Reason for Consult: Brain abscess    Referring Provider: Dr. 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 I will have a PICC placed in anticipation of at least 6 weeks of IV antibiotic therapy. I will continue his current antibiotics pending final culture results.   Plan: 1. Continue vancomycin and ceftriaxone for now pending final culture results 2. PICC placement   Active Problems:   Brain abscess   Essential hypertension, benign   Essential tremor   . cholecalciferol  2,000 Units Oral Daily  . hydrochlorothiazide  25 mg Oral Daily  . lisinopril  10 mg Oral Daily  . pantoprazole (PROTONIX) IV  40 mg Intravenous QHS  . vitamin B-12  3,000 mcg Oral Daily    HPI: Carlos Hunt is a 71 y.o. male with benign essential tremor who had bilateral deep brain stimulators placed in February of this year. He states that about one month ago he began to notice some difficulty with his speech, dragging of his right foot and worsening of his right hand tremors. This led to a CT scan which revealed a 21 mm brain abscess surrounding the left deep brain stimulator wire. There was surrounding vasogenic edema. Yesterday he underwent removal of the stimulator and aspiration of the abscess. The aspirate was described as "necrotic-appearing white matter". Gram stain of the tissue did not reveal any organisms. Cultures are pending. He and his family can tell that he is feeling better today.   Review of Systems: Review of Systems  Constitutional: Negative for chills, diaphoresis and fever.  HENT: Negative for congestion and sore throat.   Respiratory: Negative for cough, sputum production and shortness of breath.   Cardiovascular: Negative for chest pain.  Gastrointestinal: Negative for  abdominal pain, diarrhea, nausea and vomiting.  Genitourinary: Negative for dysuria.  Skin: Negative for rash.  Neurological: Positive for tremors, speech change and focal weakness. Negative for dizziness and headaches.    Past Medical History:  Diagnosis Date  . Blood in stool   . Erectile dysfunction   . Essential tremor   . GERD (gastroesophageal reflux disease)   . Hx of colonic polyps   . Hypertension   . Pain in left knee   . Pain, joint, shoulder   . PONV (postoperative nausea and vomiting)   . Seasonal allergies   . Testicular hypofunction   . Tobacco use disorder   . Tremor   . Unspecified vitamin D deficiency     Social History  Substance Use Topics  . Smoking status: Former Smoker    Packs/day: 3.00    Years: 10.00    Types: Cigarettes    Quit date: 09/23/1970  . Smokeless tobacco: Never Used     Comment: quit in the 1970's  . Alcohol use 0.0 oz/week     Comment: occasional; once every 6 months    Family History  Problem Relation Age of Onset  . Dementia Mother   . Parkinson's disease Father   . Cancer Father 37       prostate  . Heart disease Father 54       CAD/CABG  . Hypertension Father   .  Hyperlipidemia Father   . Parkinsonism Father   . Parkinson's disease Brother   . Diabetes Sister    Allergies  Allergen Reactions  . Procaine Nausea And Vomiting    NOVOCAINE    OBJECTIVE: Blood pressure (!) 145/56, pulse (!) 54, temperature 98.1 F (36.7 C), temperature source Oral, resp. rate 17, height 5' 11"  (1.803 m), weight 208 lb 1.8 oz (94.4 kg), SpO2 94 %.  Physical Exam  Constitutional: He is oriented to person, place, and time.  He is alert and comfortable sitting up in bed. His family is visiting.  Cardiovascular: Normal rate and regular rhythm.   No murmur heard. Pulmonary/Chest: Effort normal and breath sounds normal. He has no wheezes. He has no rales.  Abdominal: Soft. He exhibits no distension. There is no tenderness.    Musculoskeletal: Normal range of motion. He exhibits no edema or tenderness.  Neurological: He is alert and oriented to person, place, and time.  Very mild resting tremor of his right hand. He is slightly confused according to his family.  Skin: No rash noted.  Psychiatric: Mood and affect normal.    Lab Results Lab Results  Component Value Date   WBC 11.1 (H) 03/11/2017   HGB 14.1 03/11/2017   HCT 41.7 03/11/2017   MCV 92.1 03/11/2017   PLT 261 03/11/2017    Lab Results  Component Value Date   CREATININE 1.06 03/11/2017   BUN 11 03/11/2017   NA 138 03/11/2017   K 3.6 03/11/2017   CL 102 03/11/2017   CO2 25 03/11/2017    Lab Results  Component Value Date   ALT 56 03/10/2017   AST 34 03/10/2017   ALKPHOS 82 03/10/2017   BILITOT 0.7 03/10/2017     Microbiology: Recent Results (from the past 240 hour(s))  Aerobic/Anaerobic Culture (surgical/deep wound)     Status: None (Preliminary result)   Collection Time: 03/11/17 12:07 AM  Result Value Ref Range Status   Specimen Description ABSCESS  Final   Special Requests BRAIN ABSCESS  Final   Gram Stain   Final    ABUNDANT WBC PRESENT, PREDOMINANTLY MONONUCLEAR NO ORGANISMS SEEN CRITICAL RESULT CALLED TO, READ BACK BY AND VERIFIED WITH: TO SJENKINS(RN) BY TCLEVELAND 03/11/17 AT 2:10AM    Culture PENDING  Incomplete   Report Status PENDING  Incomplete  Aerobic/Anaerobic Culture (surgical/deep wound)     Status: None (Preliminary result)   Collection Time: 03/11/17 12:13 AM  Result Value Ref Range Status   Specimen Description WOUND  Final   Special Requests DEEP BRAIN STIMULATOR IMPLANT  Final   Gram Stain   Final    FEW WBC PRESENT, PREDOMINANTLY MONONUCLEAR NO ORGANISMS SEEN CRITICAL RESULT CALLED TO, READ BACK BY AND VERIFIED WITH: TO SJENKINS(RN) BY TCLEVELAND 03/11/17 AT 2:10AM    Culture PENDING  Incomplete   Report Status PENDING  Incomplete  MRSA PCR Screening     Status: None   Collection Time: 03/11/17  1:39 AM   Result Value Ref Range Status   MRSA by PCR NEGATIVE NEGATIVE Final    Comment:        The GeneXpert MRSA Assay (FDA approved for NASAL specimens only), is one component of a comprehensive MRSA colonization surveillance program. It is not intended to diagnose MRSA infection nor to guide or monitor treatment for MRSA infections.     Michel Bickers, MD [facility] [phone] pager   [phone] cell 03/12/2017, 12:23 PM

## 2017-03-12 NOTE — Progress Notes (Signed)
[place] pt for [facility] and Pharmacy services this hospital admission.  [facility] will support home IV ABX at home upon DC.  [place] will provide in hospital teaching with pt and wife to support independence at home.    If patient discharges after hours, please call [phone].   Larry Sierras 03/12/2017, 4:19 PM

## 2017-03-12 NOTE — Care Management Note (Addendum)
Case Management Note  Patient Details  Name: Carlos Hunt MRN: [id number] Date of Birth: [birth year]  Subjective/Objective:  Pt admitted on 03/10/17 with infected deep brain stimulator with LT frontal abscess.  PTA, pt resided at home with spouse, [name].  Per ID, pt will need at least 6 weeks of IV antibiotic therapy.                  Action/Plan: Met with pt to discuss arrangements.  PICC line placement pending.  Pt agreeable to [facility] follow up for assistance with IV antibiotic therapy and PICC line at home.  He states wife able to assist.  Spoke with wife via telephone(cell [phone]), (with pt's consent) and she states she is willing/able to assist with IV abx therapy.  Daughter is a Marine scientist and can help as well.  Referral to Carolynn Sayers, [facility] IV infusion coordinator to facilitate home IV antibiotic therapy and teaching with family.    Expected Discharge Date:                  Expected Discharge Plan:  [place]  In-House Referral:     Discharge planning Services  CM Consult  Post Acute Care Choice:  Home Health Choice offered to:  Patient, Spouse  DME Arranged:    DME Agency:     HH Arranged:  RN [place] Agency:  [place]  Status of Service:  In process, will continue to follow  If discussed at Long Length of Stay Meetings, dates discussed:    Additional Comments:  Reinaldo Raddle, RN, BSN  Trauma/Neuro ICU Case Manager [phone]

## 2017-03-12 NOTE — Telephone Encounter (Signed)
-----   Message from [place], DO sent at 03/12/2017 12:58 PM EDT ----- Let pts wife know that patient was accurate and he has an appointment on 04/30/17

## 2017-03-12 NOTE — Progress Notes (Signed)
Subjective:  Pt seen with wife/daughter.  Discussed case with [name] and [name].  Pt feeling better.  Noting tremor in R hand especially when eating.    MEDS:   Scheduled Meds: . cholecalciferol  2,000 Units Oral Daily  . hydrochlorothiazide  25 mg Oral Daily  . lisinopril  10 mg Oral Daily  . pantoprazole (PROTONIX) IV  40 mg Intravenous QHS  . vitamin B-12  3,000 mcg Oral Daily   Continuous Infusions: . cefTRIAXone (ROCEPHIN)  IV Stopped (03/12/17 1050)  . levETIRAcetam Stopped (03/12/17 0946)  . vancomycin Stopped (03/12/17 1220)   PRN Meds:.acetaminophen **OR** acetaminophen, HYDROcodone-acetaminophen, HYDROmorphone (DILAUDID) injection, labetalol, naLOXone (NARCAN)  injection, ondansetron **OR** ondansetron (ZOFRAN) IV, polyvinyl alcohol, promethazine  O:  BP (!) 145/56   Pulse (!) 54   Temp 98.1 F (36.7 C) (Oral)   Resp 17   Ht 5' 11"  (1.803 m)   Wt 208 lb 1.8 oz (94.4 kg)   SpO2 94%   BMI 29.03 kg/m   Neuro:  Gen.: Patient is alert and oriented 3.  He is able to tell me the date of his next appointment at my office (August 23) Cranial nerves: There is good facial symmetry.  Extraocular muscles are intact.  The speech remains as it was on my last examination, prior to surgery.  He has dysphasia. Tone: Normal Strength: Strength is at least antigravity 4. Abnormal Movements:  There is no tremor on the L.  There is tremor on the R, especially when he tries to use the hand (pull himself up in the bed, etc).    A/P:  1.  Cerebral abscess   -IV abx x 6 weeks.  -Talked with family.  Suspect that speech will improve and that much of speech issue is likely due to vasogenic edema but time will tell.  -Patient expresses desire to have DBS replaced due to tremor.  I told him that we will need to see how he recovers from this event and how he feels once he does recover.  If he recovers and if he feels that he still wants to proceed, we will need to wait at least 6 months.   Much greater than 50% of this visit was spent in counseling/coordinating care.  Face to face time was 25 min

## 2017-03-12 NOTE — Telephone Encounter (Signed)
Spoke with patient's wife and made her aware.

## 2017-03-12 NOTE — Progress Notes (Signed)
Subjective: Patient reports feeling a bit better.  Speech improving.  No headache.  Objective: Vital signs in last 24 hours: Temp:  [97.5 F (36.4 C)-98.5 F (36.9 C)] 97.9 F (36.6 C) (07/05 0318) Pulse Rate:  [45-69] 52 (07/05 0600) Resp:  [13-20] 16 (07/05 0600) BP: (96-158)/(51-75) 102/75 (07/05 0600) SpO2:  [91 %-99 %] 94 % (07/05 0600)  Intake/Output from previous day: 07/04 0730 - 07/05 0729 In: 810 [IV Piggyback:810] Out: 825 [Urine:825] Intake/Output this shift: No intake/output data recorded.  Physical Exam: Awake, alert, conversant.  MAEW, no drift.  Has moderate tremor on right, none on left.  Dressings CDI. Speech fairly fluent, although a bit delayed.  Lab Results:  Recent Labs  03/10/17 1549 03/10/17 1602 03/11/17 0229  WBC 10.0  --  11.1*  HGB 15.3 15.3 14.1  HCT 43.8 45.0 41.7  PLT 282  --  261   BMET  Recent Labs  03/10/17 1549 03/10/17 1602 03/11/17 0229  NA 138 140 138  K 3.6 3.7 3.6  CL 102 101 102  CO2 26  --  25  GLUCOSE 153* 147* 124*  BUN 14 17 11   CREATININE 1.11 1.00 1.06  CALCIUM 9.5  --  8.9    Studies/Results: Ct Head Wo Contrast  Result Date: 03/10/2017 CLINICAL DATA:  71 year old male with slurred speech and cognitive changes for 2 weeks. Deep brain stimulator placement in January this year. EXAM: CT HEAD WITHOUT CONTRAST TECHNIQUE: Contiguous axial images were obtained from the base of the skull through the vertex without intravenous contrast. COMPARISON:  Postoperative brain MRI 10/07/2016. Preoperative CT and MRI a 09/30/2016 and earlier. FINDINGS: Brain: In the central left frontal lobe white matter there is a new 18-21 mm diameter cystic appearing hypodense lesion located along the dorsal course of the left deep brain stimulator lead (series 3, image 21 and series 5, image 33. There is regional white matter hypodensity in a pattern resembling vasogenic edema. At the level of the left corona radiata of the edema surrounds the  left side electrode. The edema extends nearly to the subcortical white matter of the left superior frontal gyrus, and tracks into the left deep white matter capsules, but does not extend to the left thalamus. There is mild regional mass effect including partial effacement of the left lateral ventricle. No midline shift. There were no signal abnormalities along the course of the left lead at the time of the postoperative brain MRI. No adverse features identified along the right side deep brain stimulator lead. Normal gray-white matter differentiation in the right hemisphere. No acute intracranial hemorrhage. No ventriculomegaly. No acute cortically based infarct identified. Vascular: No suspicious intracranial vascular hyperdensity. Skull: Vertex burr holes have an expected appearance. No other osseous abnormality of the skull identified. Sinuses/Orbits: Visualized paranasal sinuses and mastoids are stable and well pneumatized. Other: Visualized orbit soft tissues are within normal limits. No adverse features identified along the course of the scalp electrodes tracking from the vertex along the posterior convexities. Redundant electrode loops incidentally noted at the scalp. IMPRESSION: 1. New 20 mm cystic lesion in the central left frontal lobe white matter with surrounding vasogenic edema and mild regional mass effect. This is along the course of the left deep brain stimulator lead, although nonspecific this constellation is suspicious for brain abscess. Query fever or other signs of infection. 2. Recommend Head CT with contrast to further characterize. If the clinical and imaging constellation is equivocal, we can probably perform at least a limited MRI  of the brain - pending direction from the device manufacturer. Electronically Signed   By: Genevie Ann M.D.   On: 03/10/2017 17:28   Ct Head W Wo Contrast  Result Date: 03/12/2017 CLINICAL DATA:  Brain abscess. Removal of subthalamic stimulator lead. EXAM: CT HEAD  WITHOUT AND WITH CONTRAST TECHNIQUE: Contiguous axial images were obtained from the base of the skull through the vertex without and with intravenous contrast CONTRAST:  45m ISOVUE-300 IOPAMIDOL (ISOVUE-300) INJECTION 61% COMPARISON:  Head CT 03/10/2017 FINDINGS: Brain: The left deep brain stimulator lead has been removed. There is a Mkrtchyan amount of hemorrhage along the lead tract. This measures approximately 8 x 5 mm just inferior to a fluid-filled cavity within the left frontal white matter that contains a Slape amount of air and blood. The cavity measures 18 x 16 mm, unchanged. Surrounding edema is also unchanged. There is no enhancement on postcontrast images. The right subthalamic lead is in normal position without surrounding abnormality. Vascular: No hyperdense vessel or unexpected calcification. Skull: Bifrontal burr holes. Sinuses/Orbits: No sinus fluid levels or advanced mucosal thickening. No mastoid effusion. Normal orbits. IMPRESSION: 1. Removal of left subthalamic lead with Boyde amount of hemorrhage along the tract, measuring up to 8 x 5 mm. 2. Unchanged size of fluid containing cavity in the left frontal white matter, which now also contains a Vanwinkle amount of post procedural blood and air. Surrounding edema is unchanged. There is no peripheral contrast enhancement, but this does not exclude the possibility of infection/abscess. Electronically Signed   By: KUlyses JarredM.D.   On: 03/12/2017 01:26    Assessment/Plan: Head CT stable to improved.  Exam improving.  Continue broad spectrum ABX.  Awaiting culture results.  I will consult ID.      LOS: 1 day    SPeggyann Shoals MD 03/12/2017, 7:31 AM

## 2017-03-13 ENCOUNTER — Telehealth: Payer: Self-pay | Admitting: Neurology

## 2017-03-13 LAB — VANCOMYCIN, TROUGH: Vancomycin Tr: 18 ug/mL (ref 15–20)

## 2017-03-13 MED ORDER — ERTAPENEM IV (FOR PTA / DISCHARGE USE ONLY): 1.0000 g | INTRAVENOUS | 0 refills | Status: DC

## 2017-03-13 MED ORDER — VANCOMYCIN HCL 10 G IV SOLR: 1250.0000 mg | Freq: Two times a day (BID) | INTRAVENOUS | 1 refills | Status: DC

## 2017-03-13 MED ORDER — SODIUM CHLORIDE 0.9% FLUSH: 10.0000 mL | INTRAVENOUS | Status: DC | PRN

## 2017-03-13 MED ORDER — VANCOMYCIN IV (FOR PTA / DISCHARGE USE ONLY): 1000.0000 mg | Freq: Two times a day (BID) | INTRAVENOUS | 0 refills | Status: DC

## 2017-03-13 MED ORDER — PANTOPRAZOLE SODIUM 40 MG PO TBEC: 40.0000 mg | DELAYED_RELEASE_TABLET | Freq: Every day | ORAL | Status: DC

## 2017-03-13 MED ORDER — SODIUM CHLORIDE 0.9% FLUSH
10.0000 mL | Freq: Two times a day (BID) | INTRAVENOUS | Status: DC
  Administered 2017-03-13: 10 mL

## 2017-03-13 MED ORDER — DEXTROSE 5 % IV SOLN: 2.0000 g | Freq: Two times a day (BID) | INTRAVENOUS | 1 refills | Status: DC

## 2017-03-13 MED ORDER — CHLORHEXIDINE GLUCONATE CLOTH 2 % EX PADS: 6.0000 | MEDICATED_PAD | Freq: Every day | CUTANEOUS | Status: DC

## 2017-03-13 NOTE — Telephone Encounter (Signed)
Called patient and spoke to him.  States that feeling much better.  No headache.  Speech better than yesterday but still some dysphasia.  Pt to call or go to ER immediately with new/lateralizing neuro sx's.  Told him I would likely move his follow up to a bit sooner.  Luvenia Starch, can we get him in on my work in day in august (I think it is august 2).  I can probably put him in July work in day but doubt he needs to be seen quite that fast.

## 2017-03-13 NOTE — Progress Notes (Signed)
Pharmacy Antibiotic Note  Carlos Hunt is a 71 y.o. male admitted on 03/10/2017 with brain abscess. Pharmacy has been consulted for Vancomycin dosing. Pt is s/p OR for removal of infected deep brain stimulator (with abscess) likely to receive 6 weeks of antibiotic therapy. WBC 11.1. Renal function stable. Vancomycin trough therapeutic today at 18.   Plan: Vancomycin 1250 mg IV q12h Ceftriaxone 2g IV q12h per MD Trend WBC, temp, renal function  F/U infectious work-up Drug levels as indicated   Height: 5' 11"  (180.3 cm) Weight: 208 lb 1.8 oz (94.4 kg) IBW/kg (Calculated) : 75.3  Temp (24hrs), Avg:98.1 F (36.7 C), Min:97.8 F (36.6 C), Max:98.4 F (36.9 C)   Recent Labs Lab 03/10/17 1549 03/10/17 1602 03/11/17 0229 03/13/17 1027  WBC 10.0  --  11.1*  --   CREATININE 1.11 1.00 1.06  --   VANCOTROUGH  --   --   --  18    Estimated Creatinine Clearance: 74.9 mL/min (by C-G formula based on SCr of 1.06 mg/dL).    Allergies  Allergen Reactions  . Procaine Nausea And Vomiting    NOVOCAINE   Carlos Hunt 03/13/2017 11:19 AM

## 2017-03-13 NOTE — Progress Notes (Signed)
PHARMACY CONSULT NOTE FOR:  OUTPATIENT  PARENTERAL ANTIBIOTIC THERAPY (OPAT)  Indication: brain abscess Regimen: vancomycin 1g IV q12h and ertapenem 1g IV q24 End date: 04/22/17  Discharge orders entered after discussion with [name].  Also, reducing vancomycin from 1284m q12h to 1g IV q12h due to anticipation of accumulation over time.   Thank you for allowing pharmacy to be a part of this patient's care.  FNoal, Abshier7/02/2017, 2:44 PM

## 2017-03-13 NOTE — Care Management Note (Signed)
Case Management Note  Patient Details  Name: Carlos Hunt MRN: [id number] Date of Birth: [birth year]  Subjective/Objective:  Pt admitted on 03/10/17 with infected deep brain stimulator with LT frontal abscess.  PTA, pt resided at home with spouse, [name].  Per ID, pt will need at least 6 weeks of IV antibiotic therapy.                  Action/Plan: Met with pt to discuss arrangements.  PICC line placement pending.  Pt agreeable to [facility] follow up for assistance with IV antibiotic therapy and PICC line at home.  He states wife able to assist.  Spoke with wife via telephone(cell [phone]), (with pt's consent) and she states she is willing/able to assist with IV abx therapy.  Daughter is a Marine scientist and can help as well.  Referral to Carolynn Sayers, [facility] IV infusion coordinator to facilitate home IV antibiotic therapy and teaching with family.    Expected Discharge Date:  03/13/17               Expected Discharge Plan:  [place]  In-House Referral:     Discharge planning Services  CM Consult  Post Acute Care Choice:  Home Health Choice offered to:  Patient, Spouse  DME Arranged:    DME Agency:     HH Arranged:  RN, PT, Speech Therapy [place] Agency:  [place]  Status of Service:  Completed, signed off  If discussed at [place] of Stay Meetings, dates discussed:    Additional Comments:  03/13/17 J. Natesha Hassey, RN, BSN Teaching session went well today with family, per IV infusion coordinator.  Plan dc home today with family and [facility] services as ordered.  Notified [facility] of added PT and speech therapy for home.    Reinaldo Raddle, RN, BSN  Trauma/Neuro ICU Case Manager [phone]

## 2017-03-13 NOTE — Progress Notes (Signed)
Peripherally Inserted Central Catheter/Midline Placement  The IV Nurse has discussed with the patient and/or persons authorized to consent for the patient, the purpose of this procedure and the potential benefits and risks involved with this procedure.  The benefits include less needle sticks, lab draws from the catheter, and the patient may be discharged home with the catheter. Risks include, but not limited to, infection, bleeding, blood clot (thrombus formation), and puncture of an artery; nerve damage and irregular heartbeat and possibility to perform a PICC exchange if needed/ordered by physician.  Alternatives to this procedure were also discussed.  Bard Power PICC patient education guide, fact sheet on infection prevention and patient information card has been provided to patient /or left at bedside.    PICC/Midline Placement Documentation        Synthia Innocent 03/13/2017, 9:21 AM

## 2017-03-13 NOTE — Progress Notes (Signed)
Patient ID: Carlos Hunt, male   DOB: 01-Mar-1946, 71 y.o.   MRN: [id number]          [facility]  Date of Admission:  03/10/2017           Day 3 vancomycin        Day 3 ceftriaxone ASSESSMENT: His brain abscess cultures remain negative at 48 hours and are quite likely to remain negative. I will treat him with empiric vancomycin and ertapenem. This will give him broad coverage for staph, strep anaerobic gram-negative rods and anaerobes with a reasonably convenient outpatient regimen.  PLAN: 1. Discharge home today on vancomycin and ertapenem 2. I will check final cultures and make adjustments as necessary 3. Follow-up with me in 6 weeks  Diagnosis: Postoperative brain abscess  Culture Result: Negative  Allergies  Allergen Reactions  . Procaine Nausea And Vomiting    NOVOCAINE    OPAT Orders Discharge antibiotics: Per pharmacy protocol Ertapenem and vancomycin Aim for Vancomycin trough 15-20 (unless otherwise indicated) Duration: 6 weeks End Date: 04/22/2017  [facility] Care Per Protocol:  Labs weekly while on IV antibiotics: _x_ CBC with differential _x_ BMP __ CMP __ CRP __ ESR _x_ Vancomycin trough  __ Please pull PIC at completion of IV antibiotics _x_ Please leave PIC in place until doctor has seen patient or been notified  Fax weekly labs to [phone]  Clinic Follow Up Appt: I will arrange follow-up with me in early August   Active Problems:   Brain abscess   Essential hypertension, benign   Essential tremor   . Chlorhexidine Gluconate Cloth  6 each Topical Daily  . cholecalciferol  2,000 Units Oral Daily  . hydrochlorothiazide  25 mg Oral Daily  . lisinopril  10 mg Oral Daily  . pantoprazole  40 mg Oral QHS  . sodium chloride flush  10-40 mL Intracatheter Q12H  . vitamin B-12  3,000 mcg Oral Daily    SUBJECTIVE: He is feeling much better. He notes a slight increase in his faint right hand tremors but otherwise is  feeling back to normal. His PICC was placed today. He is eager to go home this afternoon.  Review of Systems: Review of Systems  Constitutional: Negative for chills, diaphoresis and fever.  Gastrointestinal: Negative for abdominal pain, diarrhea, nausea and vomiting.  Neurological: Positive for tremors. Negative for speech change and headaches.    Allergies  Allergen Reactions  . Procaine Nausea And Vomiting    NOVOCAINE    OBJECTIVE: Vitals:   03/13/17 1040 03/13/17 1100 03/13/17 1200 03/13/17 1300  BP: 140/86 140/68 127/64 (!) 147/65  Pulse: 62 63 (!) 47 69  Resp: 20 20 15 19   Temp:   97.8 F (36.6 C)   TempSrc:   Oral   SpO2: 96% 95% 96% 94%  Weight:      Height:       Body mass index is 29.03 kg/m.  Physical Exam  Constitutional: He is oriented to person, place, and time.  He is up walking in his room. He is in good spirits.  Cardiovascular: Normal rate and regular rhythm.   No murmur heard. Pulmonary/Chest: Effort normal and breath sounds normal.  Abdominal: Soft. There is no tenderness.  Neurological: He is alert and oriented to person, place, and time.  Skin: No rash noted.   Right arm PICC.  Psychiatric: Mood and affect normal.    Lab Results Lab Results  Component Value Date   WBC 11.1 (  H) 03/11/2017   HGB 14.1 03/11/2017   HCT 41.7 03/11/2017   MCV 92.1 03/11/2017   PLT 261 03/11/2017    Lab Results  Component Value Date   CREATININE 1.06 03/11/2017   BUN 11 03/11/2017   NA 138 03/11/2017   K 3.6 03/11/2017   CL 102 03/11/2017   CO2 25 03/11/2017    Lab Results  Component Value Date   ALT 56 03/10/2017   AST 34 03/10/2017   ALKPHOS 82 03/10/2017   BILITOT 0.7 03/10/2017     Microbiology: Recent Results (from the past 240 hour(s))  Aerobic/Anaerobic Culture (surgical/deep wound)     Status: None (Preliminary result)   Collection Time: 03/11/17 12:07 AM  Result Value Ref Range Status   Specimen Description ABSCESS  Final   Special  Requests BRAIN ABSCESS  Final   Gram Stain   Final    ABUNDANT WBC PRESENT, PREDOMINANTLY MONONUCLEAR NO ORGANISMS SEEN CRITICAL RESULT CALLED TO, READ BACK BY AND VERIFIED WITH: TO SJENKINS(RN) BY TCLEVELAND 03/11/17 AT 2:10AM    Culture   Final    NO GROWTH 2 DAYS NO ANAEROBES ISOLATED; CULTURE IN PROGRESS FOR 5 DAYS   Report Status PENDING  Incomplete  Aerobic/Anaerobic Culture (surgical/deep wound)     Status: None (Preliminary result)   Collection Time: 03/11/17 12:13 AM  Result Value Ref Range Status   Specimen Description WOUND  Final   Special Requests DEEP BRAIN STIMULATOR IMPLANT  Final   Gram Stain   Final    FEW WBC PRESENT, PREDOMINANTLY MONONUCLEAR NO ORGANISMS SEEN CRITICAL RESULT CALLED TO, READ BACK BY AND VERIFIED WITH: TO SJENKINS(RN) BY TCLEVELAND 03/11/17 AT 2:10AM    Culture   Final    NO GROWTH 2 DAYS NO ANAEROBES ISOLATED; CULTURE IN PROGRESS FOR 5 DAYS   Report Status PENDING  Incomplete  MRSA PCR Screening     Status: None   Collection Time: 03/11/17  1:39 AM  Result Value Ref Range Status   MRSA by PCR NEGATIVE NEGATIVE Final    Comment:        The GeneXpert MRSA Assay (FDA approved for NASAL specimens only), is one component of a comprehensive MRSA colonization surveillance program. It is not intended to diagnose MRSA infection nor to guide or monitor treatment for MRSA infections.     Michel Bickers, MD [facility] [phone] pager   [phone] cell 03/13/2017, 2:26 PM

## 2017-03-13 NOTE — Discharge Summary (Addendum)
Physician Discharge Summary  Patient ID: Carlos Hunt MRN: [id number] DOB/AGE: [birth year] 71 y.o.  Admit date: 03/10/2017 Discharge date: 03/13/2017  Admission Diagnoses:Infected deep brain stimulator with left frontal abscess   Discharge Diagnoses: Infected deep brain stimulator with left frontal abscess s/p removal of left DBS system Active Problems:   Essential hypertension, benign   Essential tremor   Brain abscess   Discharged Condition: good  Hospital Course: Constantinos Krempasky was admitted through the ED after visiting Dr. Wells Guiles Tat's office with increased tremorand speech difficulties to rule out stroke. CT revealed abscess at left deep brain stimulator electrode. Pt was taken to OR for removal of left DBS system by Dr. Annette Stable. Cultures were obtained; and broad spectrum IVAB initiated. Pt recovered well, transferring to Neuro ICU for observation. He mobilized well with steadily improving speech. [name] (ID) consulted and initiated treatment plan of 6 weeks IVAB via PICC (Vancomycin & Ceftriaxone pending culture results).   Consults: ID  Significant Diagnostic Studies:   Treatments: surgery: Removal of a left-sided deep brain stimulator   Discharge Exam: Blood pressure 127/64, pulse (!) 47, temperature 97.8 F (36.6 C), temperature source Oral, resp. rate 15, height 5' 11"  (1.803 m), weight 94.4 kg (208 lb 1.8 oz), SpO2 96 %. Alert, sitting in bed, smiling & conversant. Speech near baseline, without hesitation or significant pause in replies. Tremor right hand.  Scalp incision with staples, no erythema, swelling, or drainage. Left chest incision without erythema, swelling, or drainage beneath honeycomb drsg. PICC RUE. Pt aware of plan for 6 weeks IVAB and agrees.      Disposition: 01-Home or Self Care with [facility] for IVAB teaching and PICC care per DrStern. IV Vancomycin 1250m q12hrs ,  IV Ceftriaxone2gm q12hrs.  Office f/u in 2 weeks for staple removal.      Discharge  Instructions    Diet - low sodium heart healthy    Complete by:  As directed    Increase activity slowly    Complete by:  As directed      Allergies as of 03/13/2017      Reactions   Procaine Nausea And Vomiting   NOVOCAINE      Medication List    TAKE these medications   aspirin 325 MG tablet Take 325 mg by mouth daily.   cefTRIAXone 2 g in dextrose 5 % 50 mL Inject 2 g into the vein every 12 (twelve) hours.   CENTRUM PO Take 1 tablet by mouth daily.   Echinacea 400 MG Caps Take 400 mg by mouth daily.   GLUCOSAMINE-CHONDROIT-MSM-C-MN PO Take 1 tablet by mouth daily.   hydrochlorothiazide 25 MG tablet Commonly known as:  HYDRODIURIL Take 1 tablet (25 mg total) by mouth daily.   lisinopril 10 MG tablet Commonly known as:  PRINIVIL,ZESTRIL Take 1 tablet (10 mg total) by mouth daily.   ULTRA OMEGA 3 PO Take 1 capsule by mouth 2 (two) times daily.   vancomycin 1,250 mg in sodium chloride 0.9 % 250 mL Inject 1,250 mg into the vein every 12 (twelve) hours.   VISINE MAXIMUM REDNESS RELIEF OP Place 1 drop into both eyes daily as needed (for irritation).   VITAMIN B12 PO Take 3,000 mcg by mouth daily. Reported on 08/27/2015   Vitamin D3 2000 units Tabs Take 2,000 Units by mouth daily.        Signed: PVerdis Prime 03/13/2017, 1:49 PM    Patient to be discharged home with PICC line and home IV antibiotics.

## 2017-03-13 NOTE — Progress Notes (Addendum)
Subjective: Patient reports "I'm doing good. They put in this PICC and taught my wife to give my medicine"  Objective: Vital signs in last 24 hours: Temp:  [97.8 F (36.6 C)-98.4 F (36.9 C)] 97.8 F (36.6 C) (07/06 1200) Pulse Rate:  [43-88] 47 (07/06 1200) Resp:  [8-22] 15 (07/06 1200) BP: (102-163)/(40-86) 127/64 (07/06 1200) SpO2:  [90 %-97 %] 96 % (07/06 1200)  Intake/Output from previous day: 07/05 0701 - 07/06 0700 In: 1250 [P.O.:440; IV Piggyback:810] Out: 250 [Urine:250] Intake/Output this shift: Total I/O In: 605 [P.O.:200; IV Piggyback:405] Out: -   Alert, sitting in bed, smiling & conversant. Speech near baseline, without hesitation or significant pause in replies. Tremor right hand.  Scalp incision with staples, no erythema, swelling, or drainage. Left chest incision without erythema, swelling, or drainage beneath honeycomb drsg. PICC RUE. Pt aware of plan for 6 weeks IVAB and agrees.   Lab Results:  Recent Labs  03/10/17 1549 03/10/17 1602 03/11/17 0229  WBC 10.0  --  11.1*  HGB 15.3 15.3 14.1  HCT 43.8 45.0 41.7  PLT 282  --  261   BMET  Recent Labs  03/10/17 1549 03/10/17 1602 03/11/17 0229  NA 138 140 138  K 3.6 3.7 3.6  CL 102 101 102  CO2 26  --  25  GLUCOSE 153* 147* 124*  BUN 14 17 11   CREATININE 1.11 1.00 1.06  CALCIUM 9.5  --  8.9    Studies/Results: Ct Head W Wo Contrast  Result Date: 03/12/2017 CLINICAL DATA:  Brain abscess. Removal of subthalamic stimulator lead. EXAM: CT HEAD WITHOUT AND WITH CONTRAST TECHNIQUE: Contiguous axial images were obtained from the base of the skull through the vertex without and with intravenous contrast CONTRAST:  65m ISOVUE-300 IOPAMIDOL (ISOVUE-300) INJECTION 61% COMPARISON:  Head CT 03/10/2017 FINDINGS: Brain: The left deep brain stimulator lead has been removed. There is a Verstraete amount of hemorrhage along the lead tract. This measures approximately 8 x 5 mm just inferior to a fluid-filled cavity  within the left frontal white matter that contains a Giron amount of air and blood. The cavity measures 18 x 16 mm, unchanged. Surrounding edema is also unchanged. There is no enhancement on postcontrast images. The right subthalamic lead is in normal position without surrounding abnormality. Vascular: No hyperdense vessel or unexpected calcification. Skull: Bifrontal burr holes. Sinuses/Orbits: No sinus fluid levels or advanced mucosal thickening. No mastoid effusion. Normal orbits. IMPRESSION: 1. Removal of left subthalamic lead with Hellums amount of hemorrhage along the tract, measuring up to 8 x 5 mm. 2. Unchanged size of fluid containing cavity in the left frontal white matter, which now also contains a Laguna amount of post procedural blood and air. Surrounding edema is unchanged. There is no peripheral contrast enhancement, but this does not exclude the possibility of infection/abscess. Electronically Signed   By: KUlyses JarredM.D.   On: 03/12/2017 01:26    Assessment/Plan: Improving  LOS: 2 days  [facility] DrStern. IV Vancomycin [id number] ,  IV Ceftriaxone2gm q12hrs.  Office f/u in 2 weeks for staple removal.    [place], BrAaron Edelman/02/2017, 1:39 PM  Patient is improving.  Cultures pending.  Home on IV antibiotics.

## 2017-03-13 NOTE — Anesthesia Postprocedure Evaluation (Signed)
Anesthesia Post Note  Patient: Carlos Hunt  Procedure(s) Performed: Procedure(s) (LRB): REMOVAL DEEP BRAIN STIMULATOR AND ASPIRATION OF BRAIN ABSCESS (Left)     Patient location during evaluation: PACU Anesthesia Type: General Level of consciousness: awake and alert Pain management: pain level controlled Vital Signs Assessment: post-procedure vital signs reviewed and stable Respiratory status: spontaneous breathing, nonlabored ventilation, respiratory function stable and patient connected to nasal cannula oxygen Cardiovascular status: blood pressure returned to baseline and stable Postop Assessment: no signs of nausea or vomiting Anesthetic complications: no    Last Vitals:  Vitals:   03/13/17 1200 03/13/17 1300  BP: 127/64 (!) 147/65  Pulse: (!) 47 69  Resp: 15 19  Temp: 36.6 C     Last Pain:  Vitals:   03/13/17 1200  TempSrc: Oral  PainSc: 0-No pain                 Branndon Tuite S

## 2017-03-14 DIAGNOSIS — K219 Gastro-esophageal reflux disease without esophagitis: Secondary | ICD-10-CM | POA: Diagnosis not present

## 2017-03-14 DIAGNOSIS — I10 Essential (primary) hypertension: Secondary | ICD-10-CM | POA: Diagnosis not present

## 2017-03-14 DIAGNOSIS — Z792 Long term (current) use of antibiotics: Secondary | ICD-10-CM | POA: Diagnosis not present

## 2017-03-14 DIAGNOSIS — Z7982 Long term (current) use of aspirin: Secondary | ICD-10-CM | POA: Diagnosis not present

## 2017-03-14 DIAGNOSIS — Z87891 Personal history of nicotine dependence: Secondary | ICD-10-CM | POA: Diagnosis not present

## 2017-03-14 DIAGNOSIS — Z452 Encounter for adjustment and management of vascular access device: Secondary | ICD-10-CM | POA: Diagnosis not present

## 2017-03-14 DIAGNOSIS — G060 Intracranial abscess and granuloma: Secondary | ICD-10-CM | POA: Diagnosis not present

## 2017-03-14 DIAGNOSIS — T85731A Infection and inflammatory reaction due to implanted electronic neurostimulator of brain, electrode (lead), initial encounter: Secondary | ICD-10-CM | POA: Diagnosis not present

## 2017-03-14 DIAGNOSIS — Z5181 Encounter for therapeutic drug level monitoring: Secondary | ICD-10-CM | POA: Diagnosis not present

## 2017-03-14 DIAGNOSIS — G250 Essential tremor: Secondary | ICD-10-CM | POA: Diagnosis not present

## 2017-03-16 ENCOUNTER — Other Ambulatory Visit: Payer: Medicare Other

## 2017-03-16 DIAGNOSIS — T85731A Infection and inflammatory reaction due to implanted electronic neurostimulator of brain, electrode (lead), initial encounter: Secondary | ICD-10-CM | POA: Diagnosis not present

## 2017-03-16 DIAGNOSIS — G250 Essential tremor: Secondary | ICD-10-CM | POA: Diagnosis not present

## 2017-03-16 DIAGNOSIS — Z452 Encounter for adjustment and management of vascular access device: Secondary | ICD-10-CM | POA: Diagnosis not present

## 2017-03-16 DIAGNOSIS — G060 Intracranial abscess and granuloma: Secondary | ICD-10-CM | POA: Diagnosis not present

## 2017-03-16 DIAGNOSIS — Z5181 Encounter for therapeutic drug level monitoring: Secondary | ICD-10-CM | POA: Diagnosis not present

## 2017-03-16 DIAGNOSIS — I10 Essential (primary) hypertension: Secondary | ICD-10-CM | POA: Diagnosis not present

## 2017-03-16 LAB — AEROBIC/ANAEROBIC CULTURE W GRAM STAIN (SURGICAL/DEEP WOUND): Culture: NO GROWTH

## 2017-03-16 LAB — AEROBIC/ANAEROBIC CULTURE (SURGICAL/DEEP WOUND)

## 2017-03-16 NOTE — Telephone Encounter (Signed)
No work in time August 2.  Work in days are July 24 and August 15.  I went ahead and scheduled patient for July 24 and we can cancel if needed. Wife made aware of appt.

## 2017-03-16 NOTE — Telephone Encounter (Signed)
That sounds good.  Please give time for extended appt

## 2017-03-17 DIAGNOSIS — Z5181 Encounter for therapeutic drug level monitoring: Secondary | ICD-10-CM | POA: Diagnosis not present

## 2017-03-17 DIAGNOSIS — Z452 Encounter for adjustment and management of vascular access device: Secondary | ICD-10-CM | POA: Diagnosis not present

## 2017-03-17 DIAGNOSIS — G060 Intracranial abscess and granuloma: Secondary | ICD-10-CM | POA: Diagnosis not present

## 2017-03-17 DIAGNOSIS — I10 Essential (primary) hypertension: Secondary | ICD-10-CM | POA: Diagnosis not present

## 2017-03-17 DIAGNOSIS — T85731A Infection and inflammatory reaction due to implanted electronic neurostimulator of brain, electrode (lead), initial encounter: Secondary | ICD-10-CM | POA: Diagnosis not present

## 2017-03-17 DIAGNOSIS — G250 Essential tremor: Secondary | ICD-10-CM | POA: Diagnosis not present

## 2017-03-18 LAB — AEROBIC/ANAEROBIC CULTURE (SURGICAL/DEEP WOUND)

## 2017-03-18 LAB — AEROBIC/ANAEROBIC CULTURE W GRAM STAIN (SURGICAL/DEEP WOUND)

## 2017-03-19 ENCOUNTER — Other Ambulatory Visit: Payer: Self-pay | Admitting: Internal Medicine

## 2017-03-19 ENCOUNTER — Telehealth: Payer: Self-pay | Admitting: Internal Medicine

## 2017-03-19 DIAGNOSIS — Z5181 Encounter for therapeutic drug level monitoring: Secondary | ICD-10-CM | POA: Diagnosis not present

## 2017-03-19 DIAGNOSIS — I10 Essential (primary) hypertension: Secondary | ICD-10-CM | POA: Diagnosis not present

## 2017-03-19 DIAGNOSIS — R700 Elevated erythrocyte sedimentation rate: Secondary | ICD-10-CM | POA: Diagnosis not present

## 2017-03-19 DIAGNOSIS — R7982 Elevated C-reactive protein (CRP): Secondary | ICD-10-CM | POA: Diagnosis not present

## 2017-03-19 DIAGNOSIS — G060 Intracranial abscess and granuloma: Secondary | ICD-10-CM

## 2017-03-19 DIAGNOSIS — T85731A Infection and inflammatory reaction due to implanted electronic neurostimulator of brain, electrode (lead), initial encounter: Secondary | ICD-10-CM | POA: Diagnosis not present

## 2017-03-19 DIAGNOSIS — Z452 Encounter for adjustment and management of vascular access device: Secondary | ICD-10-CM | POA: Diagnosis not present

## 2017-03-19 DIAGNOSIS — G250 Essential tremor: Secondary | ICD-10-CM | POA: Diagnosis not present

## 2017-03-19 MED ORDER — DEXTROSE 5 % IV SOLN: 2.0000 g | Freq: Two times a day (BID) | INTRAVENOUS | Status: DC

## 2017-03-19 NOTE — Telephone Encounter (Signed)
I received a phone call from the lab yesterday informing me that his brain stimulator that was removed last week was cultured and is growing Propionibacterium. That is the likely cause of his brain abscess. I will allow me to simplify his antibiotic therapy. I called [facility] and gave orders to discontinue vancomycin and ertapenem. He will start on ceftriaxone 2 g every 12 hours. He will follow-up with me on 04/14/2017.

## 2017-03-20 ENCOUNTER — Encounter: Payer: Self-pay | Admitting: Family Medicine

## 2017-03-20 ENCOUNTER — Ambulatory Visit (INDEPENDENT_AMBULATORY_CARE_PROVIDER_SITE_OTHER): Payer: Medicare Other | Admitting: Family Medicine

## 2017-03-20 ENCOUNTER — Ambulatory Visit (INDEPENDENT_AMBULATORY_CARE_PROVIDER_SITE_OTHER): Payer: Medicare Other

## 2017-03-20 VITALS — BP 130/68 | HR 53 | Temp 97.6°F | Resp 18 | Ht 69.29 in | Wt 211.0 lb

## 2017-03-20 DIAGNOSIS — R0609 Other forms of dyspnea: Secondary | ICD-10-CM

## 2017-03-20 DIAGNOSIS — I63312 Cerebral infarction due to thrombosis of left middle cerebral artery: Secondary | ICD-10-CM | POA: Diagnosis not present

## 2017-03-20 DIAGNOSIS — Z5181 Encounter for therapeutic drug level monitoring: Secondary | ICD-10-CM | POA: Diagnosis not present

## 2017-03-20 DIAGNOSIS — R05 Cough: Secondary | ICD-10-CM | POA: Diagnosis not present

## 2017-03-20 DIAGNOSIS — I10 Essential (primary) hypertension: Secondary | ICD-10-CM | POA: Diagnosis not present

## 2017-03-20 DIAGNOSIS — R059 Cough, unspecified: Secondary | ICD-10-CM

## 2017-03-20 DIAGNOSIS — Z452 Encounter for adjustment and management of vascular access device: Secondary | ICD-10-CM | POA: Diagnosis not present

## 2017-03-20 DIAGNOSIS — G060 Intracranial abscess and granuloma: Secondary | ICD-10-CM | POA: Diagnosis not present

## 2017-03-20 DIAGNOSIS — T85731A Infection and inflammatory reaction due to implanted electronic neurostimulator of brain, electrode (lead), initial encounter: Secondary | ICD-10-CM | POA: Diagnosis not present

## 2017-03-20 DIAGNOSIS — K4090 Unilateral inguinal hernia, without obstruction or gangrene, not specified as recurrent: Secondary | ICD-10-CM | POA: Diagnosis not present

## 2017-03-20 DIAGNOSIS — G250 Essential tremor: Secondary | ICD-10-CM

## 2017-03-20 NOTE — Patient Instructions (Signed)
General surgeons in [place]: Carlos Hausen, MD Carlos Morale, MD Carlos Starks, MD  General surgeons in [place]: Dr. Jamal Hunt      IF you received an x-ray today, you will receive an invoice from [facility]. Please contact [facility] at [phone] with questions or concerns regarding your invoice.   IF you received labwork today, you will receive an invoice from [place]. Please contact [facility] at [phone] with questions or concerns regarding your invoice.   Our billing staff will not be able to assist you with questions regarding bills from these companies.  You will be contacted with the lab results as soon as they are available. The fastest way to get your results is to activate your My Chart account. Instructions are located on the last page of this paperwork. If you have not heard from [place] regarding the results in 2 weeks, please contact this office.

## 2017-03-20 NOTE — Progress Notes (Signed)
Subjective:    Patient ID: Carlos Hunt, male    DOB: Mar 02, 1946, 71 y.o.   MRN: [id number]  03/20/2017  Insect Bite (x 2 months that is itchy on left arm )   HPI This 71 y.o. male presents for [place] for brain abscess.    Brain abscess:  Started having word-finding difficulties and tremor was returning on the R side.  Called two weeks later for an appointment with Dr. Carles Collet.  Continued to work throughout the illness. No fever/chills/sweats.  When Tat evaluated pt, recommended ED evaluation due to concern of having CVA due to word-finding issue and tremor R side.  Presented to ED and underwent CT scan which revealed abscess of brain.  Dr. Trenton Gammon evaluated in ED; went to OR that night at 11:00pm; in room at 1:00am.  [name].  Admitted for four days. PICC line. Started on Vancomycin; now Megan Salon changed Ceftriaxone every 12 hours.   [name] is ID provider.   Nurse coming twice weekly; checks BP and pulse.  Changes bandage once per week.   One complaint was on the day of discharge on Saturday; gave 30 minute training course; wife was really upset that wife had to administer vancomycin.  Wife cannot drive at night.  Denies fever/chills/sweats.  Denies headaches; denies focal weakness or paresthesias; denies worsening tremors.  No dizziness.   Immunization History  Administered Date(s) Administered  . Influenza, Seasonal, Injecte, Preservative Fre 09/23/2012  . Influenza,inj,Quad PF,36+ Mos 07/27/2013, 05/25/2015, 06/03/2016  . Influenza-Unspecified 06/08/2014  . Pneumococcal Conjugate-13 11/13/2014  . Pneumococcal Polysaccharide-23 11/27/2015  . Tdap 09/23/2012   BP Readings from Last 3 Encounters:  03/20/17 130/68  03/13/17 (!) 147/65  03/10/17 140/64   Wt Readings from Last 3 Encounters:  03/20/17 211 lb (95.7 kg)  03/11/17 208 lb 1.8 oz (94.4 kg)  03/10/17 212 lb (96.2 kg)    Review of Systems  Constitutional: Negative for activity change, appetite change,  chills, diaphoresis, fatigue and fever.  Respiratory: Positive for cough. Negative for shortness of breath.   Cardiovascular: Negative for chest pain, palpitations and leg swelling.  Gastrointestinal: Negative for abdominal pain, diarrhea, nausea and vomiting.  Endocrine: Negative for cold intolerance, heat intolerance, polydipsia, polyphagia and polyuria.  Skin: Negative for color change, rash and wound.  Neurological: Positive for tremors. Negative for dizziness, seizures, syncope, facial asymmetry, speech difficulty, weakness, light-headedness, numbness and headaches.  Psychiatric/Behavioral: Negative for dysphoric mood and sleep disturbance. The patient is not nervous/anxious.     Past Medical History:  Diagnosis Date  . Blood in stool   . Erectile dysfunction   . Essential tremor   . GERD (gastroesophageal reflux disease)   . Hx of colonic polyps   . Hypertension   . Pain in left knee   . Pain, joint, shoulder   . PONV (postoperative nausea and vomiting)   . Seasonal allergies   . Testicular hypofunction   . Tobacco use disorder   . Tremor   . Unspecified vitamin D deficiency    Past Surgical History:  Procedure Laterality Date  . COLONOSCOPY  09/09/2011   normal.  Previous polyps.  [name]. Repeat 5 years.  . COLONOSCOPY N/A 07/18/2016   Procedure: COLONOSCOPY;  Surgeon: Manya Silvas, MD;  Location: [facility] ENDOSCOPY;  Service: Endoscopy;  Laterality: N/A;  . HAND SURGERY Left 2011   trauma  . PULSE GENERATOR IMPLANT Bilateral 10/23/2016   Procedure: BILATERAL IMPLANTABLE PULSE GENERATOR;  Surgeon: Erline Levine, MD;  Location: [facility]  OR;  Service: Neurosurgery;  Laterality: Bilateral;  BILATERAL IMPLANTABLE PULSE GENERATOR  . SUBTHALAMIC STIMULATOR INSERTION Bilateral 10/07/2016   Procedure: BILATERAL DEEP BRAIN STIMULATOR PLACEMENT;  Surgeon: Erline Levine, MD;  Location: [place];  Service: Neurosurgery;  Laterality: Bilateral;  BILATERAL DEEP BRAIN STIMULATOR PLACEMENT  .  SUBTHALAMIC STIMULATOR REMOVAL Left 03/10/2017   Procedure: REMOVAL DEEP BRAIN STIMULATOR AND ASPIRATION OF BRAIN ABSCESS;  Surgeon: Earnie Larsson, MD;  Location: [place];  Service: Neurosurgery;  Laterality: Left;   Allergies  Allergen Reactions  . Procaine Nausea And Vomiting    NOVOCAINE    Social History   Social History  . Marital status: Married    Spouse name: N/A  . Number of children: 4  . Years of education: N/A   Occupational History  . building maintenance     [employer]   Social History Main Topics  . Smoking status: Former Smoker    Packs/day: 3.00    Years: 10.00    Types: Cigarettes    Quit date: 09/23/1970  . Smokeless tobacco: Never Used     Comment: quit in the 1970's  . Alcohol use 0.0 oz/week     Comment: occasional; once every 6 months  . Drug use: No  . Sexual activity: Yes   Other Topics Concern  . Not on file   Social History Narrative   Marital status: married x 45 years.      Children: 2 daughters, 2 sons; 6 grandchildren.      Lives: with wife.        Employment:  [employer] x 13 years; moderately happy.  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      Tobacco: quit 42 years ago; smoked x 10 years.       Alcohol: socially; weekends.  Rarely.      Drugs: none      Exercise:  None; work is physical.        Seatbelt: 100%      Sunscreen: rare sun exposure      Guns:  4 gun; unloaded, not stored in locked cabinet.       Smoke alarm and carbon monoxide detector in the home.      Caffeine use: consumes a minimal amount   Living Will: patient does NOT have Living Will; Does NOT have HCPOA; getting ready to work on it   Organ Donor: NO         Family History  Problem Relation Age of Onset  . Dementia Mother   . Parkinson's disease Father   . Cancer Father 22       prostate  . Heart disease Father 65       CAD/CABG  . Hypertension Father   . Hyperlipidemia Father   . Parkinsonism Father   . Parkinson's disease Brother   .  Diabetes Sister        Objective:    BP 130/68   Pulse (!) 53   Temp 97.6 F (36.4 C) (Oral)   Resp 18   Ht 5' 9.29" (1.76 m)   Wt 211 lb (95.7 kg)   SpO2 98%   BMI 30.90 kg/m  Physical Exam  Constitutional: He is oriented to person, place, and time. He appears well-developed and well-nourished. No distress.  HENT:  Head: Normocephalic and atraumatic.  Right Ear: External ear normal.  Left Ear: External ear normal.  Nose: Nose normal.  Mouth/Throat: Oropharynx is clear and moist.  Eyes: Pupils are equal, round, and  reactive to light. Conjunctivae and EOM are normal.  Neck: Normal range of motion. Neck supple. Carotid bruit is not present. No thyromegaly present.  Cardiovascular: Normal rate, regular rhythm, normal heart sounds and intact distal pulses.  Exam reveals no gallop and no friction rub.   No murmur heard. Pulmonary/Chest: Effort normal and breath sounds normal. He has no wheezes. He has no rales.  Abdominal: Soft. Bowel sounds are normal. He exhibits no distension and no mass. There is no tenderness. There is no rebound and no guarding.  Lymphadenopathy:    He has no cervical adenopathy.  Neurological: He is alert and oriented to person, place, and time. He displays tremor. No cranial nerve deficit. He exhibits normal muscle tone. Coordination and gait normal.  Skin: Skin is warm and dry. No rash noted. He is not diaphoretic.  Psychiatric: He has a normal mood and affect. His behavior is normal.  Nursing note and vitals reviewed.  Results for orders placed or performed during the hospital encounter of 03/10/17  Aerobic/Anaerobic Culture (surgical/deep wound)  Result Value Ref Range   Specimen Description ABSCESS    Special Requests BRAIN ABSCESS    Gram Stain      ABUNDANT WBC PRESENT, PREDOMINANTLY MONONUCLEAR NO ORGANISMS SEEN CRITICAL RESULT CALLED TO, READ BACK BY AND VERIFIED WITH: TO SJENKINS(RN) BY TCLEVELAND 03/11/17 AT 2:10AM    Culture No growth  aerobically or anaerobically.    Report Status 03/16/2017 FINAL   Aerobic/Anaerobic Culture (surgical/deep wound)  Result Value Ref Range   Specimen Description WOUND    Special Requests DEEP BRAIN STIMULATOR IMPLANT    Gram Stain      FEW WBC PRESENT, PREDOMINANTLY MONONUCLEAR NO ORGANISMS SEEN CRITICAL RESULT CALLED TO, READ BACK BY AND VERIFIED WITH: TO SJENKINS(RN) BY TCLEVELAND 03/11/17 AT 2:10AM    Culture      RARE PROPIONIBACTERIUM ACNES Standardized susceptibility testing for this organism is not available. CRITICAL RESULT CALLED TO, READ BACK BY AND VERIFIED WITH: [name] AT 1125 03/18/17 REGARDING CULTURE GROWTH, D. VANHOOK    Report Status 03/18/2017 FINAL   MRSA PCR Screening  Result Value Ref Range   MRSA by PCR NEGATIVE NEGATIVE  Protime-INR  Result Value Ref Range   Prothrombin Time 13.6 11.4 - 15.2 seconds   INR 1.04   APTT  Result Value Ref Range   aPTT 27 24 - 36 seconds  CBC  Result Value Ref Range   WBC 10.0 4.0 - 10.5 K/uL   RBC 4.77 4.22 - 5.81 MIL/uL   Hemoglobin 15.3 13.0 - 17.0 g/dL   HCT 43.8 39.0 - 52.0 %   MCV 91.8 78.0 - 100.0 fL   MCH 32.1 26.0 - 34.0 pg   MCHC 34.9 30.0 - 36.0 g/dL   RDW 13.2 11.5 - 15.5 %   Platelets 282 150 - 400 K/uL  Differential  Result Value Ref Range   Neutrophils Relative % 66 %   Neutro Abs 6.7 1.7 - 7.7 K/uL   Lymphocytes Relative 24 %   Lymphs Abs 2.4 0.7 - 4.0 K/uL   Monocytes Relative 7 %   Monocytes Absolute 0.7 0.1 - 1.0 K/uL   Eosinophils Relative 2 %   Eosinophils Absolute 0.2 0.0 - 0.7 K/uL   Basophils Relative 1 %   Basophils Absolute 0.1 0.0 - 0.1 K/uL  Comprehensive metabolic panel  Result Value Ref Range   Sodium 138 135 - 145 mmol/L   Potassium 3.6 3.5 - 5.1 mmol/L   Chloride  102 101 - 111 mmol/L   CO2 26 22 - 32 mmol/L   Glucose, Bld 153 (H) 65 - 99 mg/dL   BUN 14 6 - 20 mg/dL   Creatinine, Ser 1.11 0.61 - 1.24 mg/dL   Calcium 9.5 8.9 - 10.3 mg/dL   Total Protein 6.9 6.5 - 8.1 g/dL     Albumin 4.0 3.5 - 5.0 g/dL   AST 34 15 - 41 U/L   ALT 56 17 - 63 U/L   Alkaline Phosphatase 82 38 - 126 U/L   Total Bilirubin 0.7 0.3 - 1.2 mg/dL   GFR calc non Af Amer >60 >60 mL/min   GFR calc Af Amer >60 >60 mL/min   Anion gap 10 5 - 15  Basic metabolic panel  Result Value Ref Range   Sodium 138 135 - 145 mmol/L   Potassium 3.6 3.5 - 5.1 mmol/L   Chloride 102 101 - 111 mmol/L   CO2 25 22 - 32 mmol/L   Glucose, Bld 124 (H) 65 - 99 mg/dL   BUN 11 6 - 20 mg/dL   Creatinine, Ser 1.06 0.61 - 1.24 mg/dL   Calcium 8.9 8.9 - 10.3 mg/dL   GFR calc non Af Amer >60 >60 mL/min   GFR calc Af Amer >60 >60 mL/min   Anion gap 11 5 - 15  CBC  Result Value Ref Range   WBC 11.1 (H) 4.0 - 10.5 K/uL   RBC 4.53 4.22 - 5.81 MIL/uL   Hemoglobin 14.1 13.0 - 17.0 g/dL   HCT 41.7 39.0 - 52.0 %   MCV 92.1 78.0 - 100.0 fL   MCH 31.1 26.0 - 34.0 pg   MCHC 33.8 30.0 - 36.0 g/dL   RDW 13.5 11.5 - 15.5 %   Platelets 261 150 - 400 K/uL  Vancomycin, trough  Result Value Ref Range   Vancomycin Tr 18 15 - 20 ug/mL  I-stat troponin, ED  Result Value Ref Range   Troponin i, poc 0.00 0.00 - 0.08 ng/mL   Comment 3          I-Stat Chem 8, ED  Result Value Ref Range   Sodium 140 135 - 145 mmol/L   Potassium 3.7 3.5 - 5.1 mmol/L   Chloride 101 101 - 111 mmol/L   BUN 17 6 - 20 mg/dL   Creatinine, Ser 1.00 0.61 - 1.24 mg/dL   Glucose, Bld 147 (H) 65 - 99 mg/dL   Calcium, Ion 1.16 1.15 - 1.40 mmol/L   TCO2 28 0 - 100 mmol/L   Hemoglobin 15.3 13.0 - 17.0 g/dL   HCT 45.0 39.0 - 52.0 %       Assessment & Plan:   1. Brain abscess   2. Cough   3. Essential hypertension, benign   4. Essential tremor   5. Dyspnea on exertion   6. Inguinal hernia, right    -new onset brain abscess requiring surgical drainage and removal of brain nerve stimulator.  Undergoing iv abx therapy via ID/Dr. Wendie Simmer and NS. -new onset cough and DOE; obtain CXR.  No evidence of pneumonia. -RIGHT inguinal hernia; recommend  complete resolution of brain abscess with abx treatment before proceeding with inguinal hernia repair; recommend receiving clearance from ID physician and NS physician prior to proceeding with inguinal hernia repair.   Orders Placed This Encounter  Procedures  . DG Chest 2 View    Standing Status:   Future    Number of Occurrences:   1  Standing Expiration Date:   03/20/2018    Order Specific Question:   Reason for Exam (SYMPTOM  OR DIAGNOSIS REQUIRED)    Answer:   recent brain abscess; cough, dyspnea    Order Specific Question:   Preferred imaging location?    Answer:   External  . Ambulatory referral to General Surgery    Referral Priority:   Routine    Referral Type:   Surgical    Referral Reason:   Specialty Services Required    Requested Specialty:   General Surgery    Number of Visits Requested:   1   Meds ordered this encounter  Medications  . cefTRIAXone in sterile water (preservative free) injection    Sig: Inject 1 g into the vein every 12 (twelve) hours.    No Follow-up on file.   Daiya Tamer Elayne Guerin, M.D. Primary Care at [facility] [address] [place], [place]  [address] [phone] phone [phone] fax

## 2017-03-23 DIAGNOSIS — G060 Intracranial abscess and granuloma: Secondary | ICD-10-CM | POA: Diagnosis not present

## 2017-03-23 DIAGNOSIS — T85731A Infection and inflammatory reaction due to implanted electronic neurostimulator of brain, electrode (lead), initial encounter: Secondary | ICD-10-CM | POA: Diagnosis not present

## 2017-03-23 DIAGNOSIS — Z5181 Encounter for therapeutic drug level monitoring: Secondary | ICD-10-CM | POA: Diagnosis not present

## 2017-03-23 DIAGNOSIS — G250 Essential tremor: Secondary | ICD-10-CM | POA: Diagnosis not present

## 2017-03-23 DIAGNOSIS — Z452 Encounter for adjustment and management of vascular access device: Secondary | ICD-10-CM | POA: Diagnosis not present

## 2017-03-23 DIAGNOSIS — I10 Essential (primary) hypertension: Secondary | ICD-10-CM | POA: Diagnosis not present

## 2017-03-23 NOTE — Progress Notes (Signed)
OT NOTE- LATE GCODE ENTRY      11/19/16 0700  OT G-codes **NOT FOR INPATIENT CLASS**  Functional Assessment Tool Used Clinical judgement  Functional Limitation Self care  Self Care Current Status [phone]) [facility]  Self Care Discharge Status [phone]) [facility]        Jeri Modena   OTR/L Pager: [phone] Office: [phone] .

## 2017-03-30 DIAGNOSIS — Z5181 Encounter for therapeutic drug level monitoring: Secondary | ICD-10-CM | POA: Diagnosis not present

## 2017-03-30 DIAGNOSIS — I10 Essential (primary) hypertension: Secondary | ICD-10-CM | POA: Diagnosis not present

## 2017-03-30 DIAGNOSIS — G250 Essential tremor: Secondary | ICD-10-CM | POA: Diagnosis not present

## 2017-03-30 DIAGNOSIS — Z452 Encounter for adjustment and management of vascular access device: Secondary | ICD-10-CM | POA: Diagnosis not present

## 2017-03-30 DIAGNOSIS — T85731A Infection and inflammatory reaction due to implanted electronic neurostimulator of brain, electrode (lead), initial encounter: Secondary | ICD-10-CM | POA: Diagnosis not present

## 2017-03-30 DIAGNOSIS — G060 Intracranial abscess and granuloma: Secondary | ICD-10-CM | POA: Diagnosis not present

## 2017-03-30 NOTE — Progress Notes (Signed)
Carlos Hunt was seen today in the movement disorders clinic for neurologic consultation at the request of [name], MD.  I have reviewed numerous prior records made available to me.  This patient is accompanied in the office by his spouse who supplements the history.  The patient presents today for a second opinion regarding essential tremor.  He has been seeing [name] in [place] since 03/02/2014 and his last appointment with him was on November 05, 2015.  The patient reports that tremor began slowly, in approximately 2007.  Tremor started in both hands. Pt is R hand dominant but both hands shake. The patient notices tremor most when the hand is in use.  There is a family history of tremor in the patient's father, daughter, and brother, but his brother and father do have a known diagnosis of Parkinson's disease.  On his first visit with [name], the patient was started on primidone, 50 mg twice a day for essential tremor.  At his follow-up 2 months later, the patient reported that tremor was better, although he felt that the medication was causing significant dreams.  He continued on the medication until his February, 2017 follow-up and his primidone was increased to 125 mg twice a day.  Jaw tremor was noted that visit.  The patient called the neurology office a few weeks later because of anxiety and just "felt weird" and primidone was reduced to 75 mg twice a day.  04/24/16 update:  The patient was up today, accompanied by his wife who supplements the history.  Records were reviewed and are made available to me.  The patient had a dat scan at [facility] since our last visit.  There was mild asymmetry of update in caudates, less on the left than the right of uncertain etiology and could be from prominent perivascular space or from remote lacute but said not typical for PD or any parkinsonian state.  He had an MRI of the brain in 2016 that was unremarkable.  09/30/16 update:  Patient follows up  today, accompanied by his wife who supplements the history.  The patient had fiducials placed today in anticipation of DBS surgery for essential tremor.  Surgery is scheduled for one week from today, 10/07/2016.  The patient reports that he is ready for surgery.  He is currently on primidone, 50 mg bid.  Doesn't think that it is helping.  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    11/06/16 update:  Pt f/u today.  He underwent bilateral VIM DBS on 10/07/16.  He had IPG placed on 10/23/16 and did stay one night overnight.  Pt reports that he has recovered well.  No falls since our last visit.  He is no longer on the primidone.  11/27/16 update:  Patient follows up today.  His device was activated about 3 weeks ago.  The patient reports that he has done well.  He denies side effects with the device.  In regards to tremor, he states that he has minor tremor in the dominant hand.  No falls.  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  Something which was taking him 30 minutes is now taking him under 5 minutes.  He asks me to look at a place on the scalp.  03/10/17 update:  Pt seen today with wife who supplements the history.  He called 02/25/17 and said that one week prior he started having tremor and having speech issues.  Pt thinks that the onset was slow but wife thinks that it was acute.  Pt states that it was subacute but didn't tell anyone.  He is having lots of speech trouble, trouble thinking and headaches at the R occiput.  He has trouble describing the headache even when given choices of descriptors.  "I don't think that I am numb but I might be."  Unsure where.  Admits to drooling that is new.  No troubles swallowing.  Feels that he is dragging the right leg.  Tremor on the right hand is a bit worse.  He thought that it was related to DBS but told by our office to have evaluated but has not yet done so.    03/31/17 update:  Pt seen in f/u.  Much has happened.  The records  that were made available to me were reviewed.  He had his L DBS system removed on 03/10/17 due to cerebral abscess. It ultimately grew out propionibacterium.  He is seeing [name].  He has an appt with him on 04/14/17.  He saw [name] last week and while I don't have f/u notes, I did speak to [name] about him.  He felt that he was doing better and wanted to hold off on repeating CT brain until abx were completed.  Pt reports that he feels great.  Speech is back to normal.  Biggest issue is re-emergence of tremor in the right hand.  Asks me about when DBS can be placed back.    Neuroimaging has previously been performed.  It is available for my review today.  MRI was done 10/02/14 and was unremarkable.    ALLERGIES:   Allergies  Allergen Reactions  . Procaine Nausea And Vomiting    NOVOCAINE    CURRENT MEDICATIONS:  Outpatient Encounter Prescriptions as of 03/31/2017  Medication Sig  . aspirin 325 MG tablet Take 325 mg by mouth daily.  . cefTRIAXone 2 g in dextrose 5 % 50 mL Inject 2 g into the vein every 12 (twelve) hours.  . cefTRIAXone in sterile water (preservative free) injection Inject 1 g into the vein every 12 (twelve) hours.  . Cholecalciferol (VITAMIN D3) 2000 units TABS Take 2,000 Units by mouth daily.  . Cyanocobalamin (VITAMIN B12 PO) Take 3,000 mcg by mouth daily. Reported on 08/27/2015  . Echinacea 400 MG CAPS Take 400 mg by mouth daily.  Marland Kitchen GLUCOSAMINE-CHONDROIT-MSM-C-MN PO Take 1 tablet by mouth daily.   . hydrochlorothiazide (HYDRODIURIL) 25 MG tablet Take 1 tablet (25 mg total) by mouth daily.  Marland Kitchen lisinopril (PRINIVIL,ZESTRIL) 10 MG tablet Take 1 tablet (10 mg total) by mouth daily.  . Multiple Vitamins-Minerals (CENTRUM PO) Take 1 tablet by mouth daily.   . Omega-3 Fatty Acids (ULTRA OMEGA 3 PO) Take 1 capsule by mouth 2 (two) times daily.   . Tetrahydroz-Glyc-Hyprom-PEG (VISINE MAXIMUM REDNESS RELIEF OP) Place 1 drop into both eyes daily as needed (for irritation).     No facility-administered encounter medications on file as of 03/31/2017.     PAST MEDICAL HISTORY:   Past Medical History:  Diagnosis Date  . Blood in stool   . Erectile dysfunction   . Essential tremor   . GERD (gastroesophageal reflux disease)   . Hx of colonic polyps   . Hypertension   . Pain in left knee   . Pain, joint, shoulder   . PONV (postoperative nausea and vomiting)   . Seasonal allergies   . Testicular hypofunction   .  Tobacco use disorder   . Tremor   . Unspecified vitamin D deficiency     PAST SURGICAL HISTORY:   Past Surgical History:  Procedure Laterality Date  . COLONOSCOPY  09/09/2011   normal.  Previous polyps.  [name]. Repeat 5 years.  . COLONOSCOPY N/A 07/18/2016   Procedure: COLONOSCOPY;  Surgeon: Manya Silvas, MD;  Location: [facility] ENDOSCOPY;  Service: Endoscopy;  Laterality: N/A;  . HAND SURGERY Left 2011   trauma  . PULSE GENERATOR IMPLANT Bilateral 10/23/2016   Procedure: BILATERAL IMPLANTABLE PULSE GENERATOR;  Surgeon: Erline Levine, MD;  Location: [place];  Service: Neurosurgery;  Laterality: Bilateral;  BILATERAL IMPLANTABLE PULSE GENERATOR  . SUBTHALAMIC STIMULATOR INSERTION Bilateral 10/07/2016   Procedure: BILATERAL DEEP BRAIN STIMULATOR PLACEMENT;  Surgeon: Erline Levine, MD;  Location: [place];  Service: Neurosurgery;  Laterality: Bilateral;  BILATERAL DEEP BRAIN STIMULATOR PLACEMENT  . SUBTHALAMIC STIMULATOR REMOVAL Left 03/10/2017   Procedure: REMOVAL DEEP BRAIN STIMULATOR AND ASPIRATION OF BRAIN ABSCESS;  Surgeon: Earnie Larsson, MD;  Location: [place];  Service: Neurosurgery;  Laterality: Left;    SOCIAL HISTORY:   Social History   Social History  . Marital status: Married    Spouse name: N/A  . Number of children: 4  . Years of education: N/A   Occupational History  . building maintenance     [employer]   Social History Main Topics  . Smoking status: Former Smoker    Packs/day: 3.00    Years: 10.00    Types: Cigarettes     Quit date: 09/23/1970  . Smokeless tobacco: Never Used     Comment: quit in the 1970's  . Alcohol use 0.0 oz/week     Comment: occasional; once every 6 months  . Drug use: No  . Sexual activity: Yes   Other Topics Concern  . Not on file   Social History Narrative   Marital status: married x 45 years.      Children: 2 daughters, 2 sons; 6 grandchildren.      Lives: with wife.        Employment:  [employer] x 13 years; moderately happy.  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      Tobacco: quit 42 years ago; smoked x 10 years.       Alcohol: socially; weekends.  Rarely.      Drugs: none      Exercise:  None; work is physical.        Seatbelt: 100%      Sunscreen: rare sun exposure      Guns:  4 gun; unloaded, not stored in locked cabinet.       Smoke alarm and carbon monoxide detector in the home.      Caffeine use: consumes a minimal amount   Living Will: patient does NOT have Living Will; Does NOT have HCPOA; getting ready to work on it   Organ Donor: NO          FAMILY HISTORY:   Family Status  Relation Status  . Mother Deceased at age 70       dementia  . Father Deceased at age 75       PD, heart disease  . Sister Alive       unknown  . Brother Deceased at age 50       Parkinson's disease  . Sister Alive       unknown  . Brother Alive       unknown  .  Brother Alive       unknown  . Daughter Alive       tremor  . Son Alive       healthy    ROS:  A complete 10 system review of systems was obtained and was unremarkable apart from what is mentioned above.  PHYSICAL EXAMINATION:    VITALS:   Vitals:   03/31/17 1339  BP: (!) 148/78  Pulse: 72  SpO2: 95%  Weight: 211 lb (95.7 kg)  Height: 5' 11"  (1.803 m)    GEN:  The patient appears stated age and is in NAD.  Pt diaphoretic HEENT:  Normocephalic, atraumatic.  The mucous membranes are moist. The superficial temporal arteries are without ropiness or tenderness. CV:  RRR Lungs:  CTAB Neck/HEME:   There are no carotid bruits bilaterally. Skin:  Battery sites and scalp incisions are well healed.    Neurological examination:  Orientation: The patient is alert and oriented x3.  Cranial nerves: There is good facial symmetry.  No facial asymmetry.  The visual fields are full to confrontational testing. The speech is fluent again and clear.   Soft palate rises symmetrically and there is no tongue deviation. Hearing is intact to conversational tone. Sensation: Sensation is intact to light and pinprick throughout (facial, trunk, extremities).  There is no extinction with double simultaneous stimulation. There is no sensory dermatomal level identified. Motor: Strength is 5/5 in the bilateral upper and lower extremities.   Shoulder shrug is equal and symmetric.  There is no pronator drift.   Movement examination: Tone: There is normal tone in the bilateral upper extremities.  The tone in the lower extremities is normal.  Abnormal movements:  He has tremor on the R as he no longer has a DBS in place on the L.  No tremor on the L hand.   Coordination:  There is good RAMs. Gait and Station: The patient has no difficulty arising out of a deep-seated chair without the use of the hands. The patient's stride length is normal.  The patient has a negative pull test.    DBS programming was performed today.  No change in programming today  ASSESSMENT/PLAN:  1.  Tremor  -He likely has long standing ET that is now manifesting as a resting tremor.   -The patient had a dat scan at [facility] since our last visit.  There was mild asymmetry of uptake in caudates, less on the left than the right of uncertain etiology and could be from prominent perivascular space or from remote lacute but said not typical for PD or any parkinsonian state.  He had an MRI of the brain in 2016 that was unremarkable.  -Patient underwent bilateral VIM DBS on 10/07/2016.  He had bilateral IPG placement on 10/23/2016.  Patient's DBS device  was activated, 11/07/2016.  Unfortunately, he had a cerebral absess on the left and the L system was removed on 03/10/17.  He is currently on Abx therapy and seeing ID.  -repeat CT once abx complete per [name].  Pt is clinically markedly better.    -told patient won't replace the L DBS system for at least 6 months.  He expressed understanding.  2.  F/u here in November.  Told him if ever experiences focal or lateralizing neuro s/s, needs to go to ER immediately or call 911.  Much greater than 50% of this visit was spent in counseling and coordinating care.  Total face to face time:  25 min , which  didn't include DBS time

## 2017-03-31 ENCOUNTER — Encounter: Payer: Self-pay | Admitting: Neurology

## 2017-03-31 ENCOUNTER — Ambulatory Visit (INDEPENDENT_AMBULATORY_CARE_PROVIDER_SITE_OTHER): Payer: Medicare Other | Admitting: Neurology

## 2017-03-31 DIAGNOSIS — I63312 Cerebral infarction due to thrombosis of left middle cerebral artery: Secondary | ICD-10-CM | POA: Diagnosis not present

## 2017-03-31 DIAGNOSIS — G250 Essential tremor: Secondary | ICD-10-CM | POA: Diagnosis not present

## 2017-03-31 DIAGNOSIS — G060 Intracranial abscess and granuloma: Secondary | ICD-10-CM | POA: Diagnosis not present

## 2017-03-31 NOTE — Procedures (Signed)
DBS Programming was performed.    Total time spent programming was 10 minutes.  Device was confirmed to be on.  Soft start was confirmed to be on.  Impedences were checked and were within normal limits.  Battery was checked and was determined to be functioning normally and not near the end of life (2.96).  Final settings were as follows:  Left brain electrode:     n/a  Right brain electrode:     2-1+          ; Amplitude   2.7  V ;  Pulse width 90  microseconds;  Frequency   150    Hz.

## 2017-04-02 DIAGNOSIS — D369 Benign neoplasm, unspecified site: Secondary | ICD-10-CM

## 2017-04-02 HISTORY — DX: Benign neoplasm, unspecified site: D36.9

## 2017-04-06 DIAGNOSIS — T85731A Infection and inflammatory reaction due to implanted electronic neurostimulator of brain, electrode (lead), initial encounter: Secondary | ICD-10-CM | POA: Diagnosis not present

## 2017-04-06 DIAGNOSIS — G250 Essential tremor: Secondary | ICD-10-CM | POA: Diagnosis not present

## 2017-04-06 DIAGNOSIS — Z452 Encounter for adjustment and management of vascular access device: Secondary | ICD-10-CM | POA: Diagnosis not present

## 2017-04-06 DIAGNOSIS — G060 Intracranial abscess and granuloma: Secondary | ICD-10-CM | POA: Diagnosis not present

## 2017-04-06 DIAGNOSIS — Z5181 Encounter for therapeutic drug level monitoring: Secondary | ICD-10-CM | POA: Diagnosis not present

## 2017-04-06 DIAGNOSIS — I10 Essential (primary) hypertension: Secondary | ICD-10-CM | POA: Diagnosis not present

## 2017-04-08 ENCOUNTER — Ambulatory Visit (INDEPENDENT_AMBULATORY_CARE_PROVIDER_SITE_OTHER): Payer: Medicare Other | Admitting: Surgery

## 2017-04-08 ENCOUNTER — Encounter: Payer: Self-pay | Admitting: Surgery

## 2017-04-08 VITALS — BP 181/78 | HR 80 | Temp 97.7°F | Ht 71.0 in | Wt 212.0 lb

## 2017-04-08 DIAGNOSIS — I63312 Cerebral infarction due to thrombosis of left middle cerebral artery: Secondary | ICD-10-CM | POA: Diagnosis not present

## 2017-04-08 DIAGNOSIS — K4090 Unilateral inguinal hernia, without obstruction or gangrene, not specified as recurrent: Secondary | ICD-10-CM

## 2017-04-08 NOTE — Patient Instructions (Signed)
Please give [place] a call when your doctor gives you the okay for [place] to have your hernia repaired.   Inguinal Hernia, Adult An inguinal hernia is when fat or the intestines push through the area where the leg meets the lower belly (groin) and make a rounded lump (bulge). This condition happens over time. There are three types of inguinal hernias. These types include:  Hernias that can be pushed back into the belly (are reducible).  Hernias that cannot be pushed back into the belly (are incarcerated).  Hernias that cannot be pushed back into the belly and lose their blood supply (get strangulated). This type needs emergency surgery.  Follow these instructions at home: Lifestyle  Drink enough fluid to keep your urine (pee) clear or pale yellow.  Eat plenty of fruits, vegetables, and whole grains. These have a lot of fiber. Talk with your doctor if you have questions.  Avoid lifting heavy objects.  Avoid standing for long periods of time.  Do not use tobacco products. These include cigarettes, chewing tobacco, or e-cigarettes. If you need help quitting, ask your doctor.  Try to stay at a healthy weight. General instructions  Do not try to force the hernia back in.  Watch your hernia for any changes in color or size. Let your doctor know if there are any changes.  Take over-the-counter and prescription medicines only as told by your doctor.  Keep all follow-up visits as told by your doctor. This is important. Contact a doctor if:  You have a fever.  You have new symptoms.  Your symptoms get worse. Get help right away if:  The area where the legs meets the lower belly has: ? Pain that gets worse suddenly. ? A bulge that gets bigger suddenly and does not go down. ? A bulge that turns red or purple. ? A bulge that is painful to the touch.  You are a man and your scrotum: ? Suddenly feels painful. ? Suddenly changes in size.  You feel sick to your stomach (nauseous) and this  feeling does not go away.  You throw up (vomit) and this keeps happening.  You feel your heart beating a lot more quickly than normal.  You cannot poop (have a bowel movement) or pass gas. This information is not intended to replace advice given to you by your health care provider. Make sure you discuss any questions you have with your health care provider. Document Released: 09/25/2006 Document Revised: 01/31/2016 Document Reviewed: 07/05/2014 Elsevier Interactive Patient Education  2018 [employer].

## 2017-04-08 NOTE — Progress Notes (Signed)
Surgical Consultation  04/08/2017  Carlos Hunt is an 71 y.o. male.   CC:ing hernia, right  HPI: Is patient with an inguinal hernia. He has had a recent brain abscess drained surgically. That was less than 1 month ago. This was done with apparently bur holes. He states that those are completely healed at this point and points to areas anteriorly on his head.  He's had the right inguinal hernia for at least a year probably longer but over the last 6 months it started causing him minimal pain no constipation no nausea or vomiting no fevers or chills. It is mostly reducible but not completely. He denies left-sided pain  Past Medical History:  Diagnosis Date  . Adenomatous polyps 04/02/2017  . Blood in stool   . Brain abscess 03/11/2017  . Erectile dysfunction   . Essential hypertension, benign 02/14/2013  . Essential tremor   . GERD (gastroesophageal reflux disease)   . Hx of colonic polyps   . Hypertension   . Pain in left knee   . Pain, joint, shoulder   . PONV (postoperative nausea and vomiting)   . Seasonal allergies   . Testicular hypofunction   . Tobacco use disorder   . Tremor   . Unspecified vitamin D deficiency     Past Surgical History:  Procedure Laterality Date  . COLONOSCOPY  09/09/2011   normal.  Previous polyps.  [name]. Repeat 5 years.  . COLONOSCOPY N/A 07/18/2016   Procedure: COLONOSCOPY;  Surgeon: Manya Silvas, MD;  Location: [facility] ENDOSCOPY;  Service: Endoscopy;  Laterality: N/A;  . HAND SURGERY Left 2011   trauma  . PULSE GENERATOR IMPLANT Bilateral 10/23/2016   Procedure: BILATERAL IMPLANTABLE PULSE GENERATOR;  Surgeon: Erline Levine, MD;  Location: [place];  Service: Neurosurgery;  Laterality: Bilateral;  BILATERAL IMPLANTABLE PULSE GENERATOR  . SUBTHALAMIC STIMULATOR INSERTION Bilateral 10/07/2016   Procedure: BILATERAL DEEP BRAIN STIMULATOR PLACEMENT;  Surgeon: Erline Levine, MD;  Location: [place];  Service: Neurosurgery;  Laterality: Bilateral;  BILATERAL DEEP  BRAIN STIMULATOR PLACEMENT  . SUBTHALAMIC STIMULATOR REMOVAL Left 03/10/2017   Procedure: REMOVAL DEEP BRAIN STIMULATOR AND ASPIRATION OF BRAIN ABSCESS;  Surgeon: Earnie Larsson, MD;  Location: [place];  Service: Neurosurgery;  Laterality: Left;    Family History  Problem Relation Age of Onset  . Dementia Mother   . Parkinson's disease Father   . Cancer Father 73       prostate  . Heart disease Father 59       CAD/CABG  . Hypertension Father   . Hyperlipidemia Father   . Parkinsonism Father   . Parkinson's disease Brother   . Diabetes Sister     Social History:  reports that he quit smoking about 46 years ago. His smoking use included Cigarettes. He has a 30.00 pack-year smoking history. He has never used smokeless tobacco. He reports that he drinks alcohol. He reports that he does not use drugs. 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 Allergies:  Allergies  Allergen Reactions  . Procaine Nausea And Vomiting    NOVOCAINE    Medications reviewed.   Review of Systems:   Review of Systems  Constitutional: Negative.  Negative for chills and fever.  HENT: Negative.   Eyes: Negative.   Respiratory: Negative.   Cardiovascular: Negative.   Gastrointestinal: Negative.   Genitourinary: Negative.   Musculoskeletal: Negative.   Skin: Negative.   Neurological: Positive for speech change and focal weakness.  Endo/Heme/Allergies: Negative.  Psychiatric/Behavioral: Negative.      Physical Exam:  There were no vitals taken for this visit.  Physical Exam  Constitutional: He is oriented to person, place, and time and well-developed, well-nourished, and in no distress. No distress.  Bandage on right antecubital fossa with PICC line.  HENT:  Head: Normocephalic.  Healed bur holes anteriorly that patient points out.  Eyes: Pupils are equal, round, and reactive to light. Right eye exhibits no discharge. Left eye exhibits no discharge. No scleral icterus.  Neck: Normal  range of motion. No JVD present.  Cardiovascular: Normal rate and regular rhythm.   Pulmonary/Chest: Effort normal. No respiratory distress. He has no wheezes. He has no rales.  Abdominal: Soft. He exhibits no distension. There is no tenderness. There is no rebound.  Genitourinary: Penis normal.  Genitourinary Comments: Patient is examined standing supine and with Valsalva. Large right inguinal hernia only partially reducible on the right side. No left inguinal hernia normal testicles  Musculoskeletal: Normal range of motion. He exhibits no edema.  Lymphadenopathy:    He has no cervical adenopathy.  Neurological: He is alert and oriented to person, place, and time.  Patient seems to have some word finding slowness  Skin: Skin is warm and dry. He is not diaphoretic. No erythema.  Psychiatric: Mood and affect normal.  Vitals reviewed.     No results found for this or any previous visit (from the past 48 hour(s)). No results found.  Assessment/Plan:  This a patient with a right internal hernia which is partially incarcerated. Under normal circumstances I would recommend a laparoscopic preperitoneal repair. However the patient has had a drain in the drainage of a brain abscess less than 1 month ago. He still has a PICC line in place and stimulators. I would definitely wait to give him a general anesthetic and expose him to the risks of additional infection until well after this brain abscess was completely cleared. He will call me once he is cleared by his neurosurgeon. Patient was understanding and in agreement of this.  Florene Glen, MD, FACS

## 2017-04-13 ENCOUNTER — Ambulatory Visit: Payer: Medicare Other | Admitting: Internal Medicine

## 2017-04-13 ENCOUNTER — Encounter: Payer: Self-pay | Admitting: Internal Medicine

## 2017-04-13 ENCOUNTER — Ambulatory Visit (INDEPENDENT_AMBULATORY_CARE_PROVIDER_SITE_OTHER): Payer: Medicare Other | Admitting: Internal Medicine

## 2017-04-13 DIAGNOSIS — G250 Essential tremor: Secondary | ICD-10-CM | POA: Diagnosis not present

## 2017-04-13 DIAGNOSIS — G060 Intracranial abscess and granuloma: Secondary | ICD-10-CM | POA: Diagnosis not present

## 2017-04-13 DIAGNOSIS — I10 Essential (primary) hypertension: Secondary | ICD-10-CM | POA: Diagnosis not present

## 2017-04-13 DIAGNOSIS — T85731A Infection and inflammatory reaction due to implanted electronic neurostimulator of brain, electrode (lead), initial encounter: Secondary | ICD-10-CM | POA: Diagnosis not present

## 2017-04-13 DIAGNOSIS — Z452 Encounter for adjustment and management of vascular access device: Secondary | ICD-10-CM | POA: Diagnosis not present

## 2017-04-13 DIAGNOSIS — I63312 Cerebral infarction due to thrombosis of left middle cerebral artery: Secondary | ICD-10-CM

## 2017-04-13 DIAGNOSIS — Z5181 Encounter for therapeutic drug level monitoring: Secondary | ICD-10-CM | POA: Diagnosis not present

## 2017-04-13 MED ORDER — DEXTROSE 5 % IV SOLN: 2.0000 g | Freq: Two times a day (BID) | INTRAVENOUS | Status: AC

## 2017-04-13 NOTE — Progress Notes (Signed)
[facility]  Patient Active Problem List   Diagnosis Date Noted  . Brain abscess 03/11/2017    Priority: High  . Adenomatous polyps 04/02/2017  . Essential tremor 08/07/2014  . Essential hypertension, benign 02/14/2013    Patient's Medications  New Prescriptions   No medications on file  Previous Medications   ASPIRIN 325 MG TABLET    Take 325 mg by mouth daily.   CHOLECALCIFEROL (VITAMIN D3) 2000 UNITS TABS    Take 2,000 Units by mouth daily.   CYANOCOBALAMIN (VITAMIN B12 PO)    Take 3,000 mcg by mouth daily. Reported on 08/27/2015   ECHINACEA 400 MG CAPS    Take 400 mg by mouth daily.   GLUCOSAMINE-CHONDROIT-MSM-C-MN PO    Take 1 tablet by mouth daily.    HYDROCHLOROTHIAZIDE (HYDRODIURIL) 25 MG TABLET    Take 1 tablet (25 mg total) by mouth daily.   LISINOPRIL (PRINIVIL,ZESTRIL) 10 MG TABLET    Take 1 tablet (10 mg total) by mouth daily.   MULTIPLE VITAMINS-MINERALS (CENTRUM PO)    Take 1 tablet by mouth daily.    OMEGA-3 FATTY ACIDS (ULTRA OMEGA 3 PO)    Take 1 capsule by mouth 2 (two) times daily.    TETRAHYDROZ-GLYC-HYPROM-PEG (VISINE MAXIMUM REDNESS RELIEF OP)    Place 1 drop into both eyes daily as needed (for irritation).   Modified Medications   Modified Medication Previous Medication   CEFTRIAXONE 2 G IN DEXTROSE 5 % 50 ML cefTRIAXone 2 g in dextrose 5 % 50 mL      Inject 2 g into the vein every 12 (twelve) hours.    Inject 2 g into the vein every 12 (twelve) hours.  Discontinued Medications   CEFTRIAXONE IN STERILE WATER (PRESERVATIVE FREE) INJECTION    Inject 1 g into the vein every 12 (twelve) hours.    Subjective: Carlos Hunt is in for his hospital follow-up visit. He has benign essential tremor. He had bilateral deep brain stimulators placed in February of this year. About 2 months ago he began to notice some difficulty with his speech, dragging of his right foot and worsening of his right hand tremors. This led to a CT scan  which revealed a 21 mm brain abscess surrounding the left deep brain stimulator wire. There was surrounding vasogenic edema. He underwent removal of the stimulator and aspiration of the abscess. The aspirate was described as "necrotic-appearing white matter". Gram stain of the tissue did not reveal any organisms. Cultures grew Propionibacterium. He was discharged on IV ceftriaxone. He is now completed 34 days of IV antibiotic therapy. He's had no problems tolerating his PICC or ceftriaxone. He is feeling much better. His right hand tremor is better than when he was admitted to the hospital but still quite bothersome for him. His right-sided weakness and speech difficulties have resolved completely.  Review of Systems: Review of Systems  Constitutional: Negative for chills, diaphoresis and fever.  Gastrointestinal: Negative for abdominal pain, diarrhea, nausea and vomiting.  Skin: Negative for rash.  Neurological: Positive for tremors. Negative for sensory change, speech change and headaches.    Past Medical History:  Diagnosis Date  . Adenomatous polyps 04/02/2017  . Blood in stool   . Brain abscess 03/11/2017  . Erectile dysfunction   . Essential hypertension, benign 02/14/2013  . Essential tremor   . GERD (gastroesophageal reflux disease)   . Hx of colonic polyps   . Hypertension   .  Pain in left knee   . Pain, joint, shoulder   . PONV (postoperative nausea and vomiting)   . Seasonal allergies   . Testicular hypofunction   . Tobacco use disorder   . Tremor   . Unspecified vitamin D deficiency     Social History  Substance Use Topics  . Smoking status: Former Smoker    Packs/day: 3.00    Years: 10.00    Types: Cigarettes    Quit date: 09/23/1970  . Smokeless tobacco: Never Used     Comment: quit in the 1970's  . Alcohol use 0.0 oz/week     Comment: occasional; once every 6 months    Family History  Problem Relation Age of Onset  . Dementia Mother   . Parkinson's disease  Father   . Cancer Father 100       prostate  . Heart disease Father 25       CAD/CABG  . Hypertension Father   . Hyperlipidemia Father   . Parkinsonism Father   . Parkinson's disease Brother   . Diabetes Sister     Allergies  Allergen Reactions  . Procaine Nausea And Vomiting    NOVOCAINE    Objective: Vitals:   04/13/17 1513  BP: (!) 177/79  Pulse: 73  Temp: 97.8 F (36.6 C)  TempSrc: Oral  Weight: 214 lb (97.1 kg)   Body mass index is 29.85 kg/m.  Physical Exam  Constitutional: He is oriented to person, place, and time.  He is smiling and in good spirits.  Neurological: He is alert and oriented to person, place, and time. Gait normal.  His scalp incision is completely healed. His speech is fluent and normal.  Skin: No rash noted.  Right arm PICC site looks normal.  Psychiatric: Mood and affect normal.    Lab Results    Problem List Items Addressed This Visit      High   Brain abscess    He is improving on therapy for Propionibacterium brain abscess. He will complete 6 weeks of IV ceftriaxone on 04/22/2017 and have the PICC removed. He will follow-up here next month.      Relevant Medications   cefTRIAXone 2 g in dextrose 5 % 50 mL       Carlos Bickers, MD [facility] [phone] pager   [phone] cell 04/13/2017, 3:51 PM

## 2017-04-13 NOTE — Assessment & Plan Note (Signed)
He is improving on therapy for Propionibacterium brain abscess. He will complete 6 weeks of IV ceftriaxone on 04/22/2017 and have the PICC removed. He will follow-up here next month.

## 2017-04-14 ENCOUNTER — Ambulatory Visit: Payer: Medicare Other | Admitting: Internal Medicine

## 2017-04-20 DIAGNOSIS — G250 Essential tremor: Secondary | ICD-10-CM | POA: Diagnosis not present

## 2017-04-20 DIAGNOSIS — Z452 Encounter for adjustment and management of vascular access device: Secondary | ICD-10-CM | POA: Diagnosis not present

## 2017-04-20 DIAGNOSIS — Z5181 Encounter for therapeutic drug level monitoring: Secondary | ICD-10-CM | POA: Diagnosis not present

## 2017-04-20 DIAGNOSIS — I10 Essential (primary) hypertension: Secondary | ICD-10-CM | POA: Diagnosis not present

## 2017-04-20 DIAGNOSIS — T85731A Infection and inflammatory reaction due to implanted electronic neurostimulator of brain, electrode (lead), initial encounter: Secondary | ICD-10-CM | POA: Diagnosis not present

## 2017-04-20 DIAGNOSIS — G060 Intracranial abscess and granuloma: Secondary | ICD-10-CM | POA: Diagnosis not present

## 2017-04-22 MED ORDER — CEFAZOLIN SODIUM-DEXTROSE 2-4 GM/100ML-% IV SOLN
INTRAVENOUS | Status: AC
  Filled 2017-04-22: qty 100

## 2017-04-23 ENCOUNTER — Telehealth: Payer: Self-pay | Admitting: Pharmacist

## 2017-04-23 NOTE — Telephone Encounter (Signed)
Jeani Hawking, PharmD at [facility], called asking if it is ok to pull patient's PICC line since he finished IV abx yesterday.  Per Dr. Hale Bogus last note, ok to pull PICC after completion of IV abx. Gave verbal to [employer].

## 2017-04-27 DIAGNOSIS — I10 Essential (primary) hypertension: Secondary | ICD-10-CM | POA: Diagnosis not present

## 2017-04-27 DIAGNOSIS — G250 Essential tremor: Secondary | ICD-10-CM | POA: Diagnosis not present

## 2017-04-27 DIAGNOSIS — G060 Intracranial abscess and granuloma: Secondary | ICD-10-CM | POA: Diagnosis not present

## 2017-04-27 DIAGNOSIS — Z5181 Encounter for therapeutic drug level monitoring: Secondary | ICD-10-CM | POA: Diagnosis not present

## 2017-04-27 DIAGNOSIS — Z452 Encounter for adjustment and management of vascular access device: Secondary | ICD-10-CM | POA: Diagnosis not present

## 2017-04-27 DIAGNOSIS — T85731A Infection and inflammatory reaction due to implanted electronic neurostimulator of brain, electrode (lead), initial encounter: Secondary | ICD-10-CM | POA: Diagnosis not present

## 2017-04-30 ENCOUNTER — Ambulatory Visit: Payer: Medicare Other | Admitting: Neurology

## 2017-05-26 ENCOUNTER — Ambulatory Visit: Payer: Medicare Other | Admitting: Internal Medicine

## 2017-06-02 ENCOUNTER — Encounter: Payer: Self-pay | Admitting: Family Medicine

## 2017-06-02 ENCOUNTER — Ambulatory Visit (INDEPENDENT_AMBULATORY_CARE_PROVIDER_SITE_OTHER): Payer: Medicare Other | Admitting: Family Medicine

## 2017-06-02 VITALS — BP 132/70 | HR 72 | Temp 98.0°F | Resp 16 | Ht 70.08 in | Wt 209.0 lb

## 2017-06-02 DIAGNOSIS — K4090 Unilateral inguinal hernia, without obstruction or gangrene, not specified as recurrent: Secondary | ICD-10-CM

## 2017-06-02 DIAGNOSIS — I63312 Cerebral infarction due to thrombosis of left middle cerebral artery: Secondary | ICD-10-CM | POA: Diagnosis not present

## 2017-06-02 DIAGNOSIS — G060 Intracranial abscess and granuloma: Secondary | ICD-10-CM

## 2017-06-02 DIAGNOSIS — I10 Essential (primary) hypertension: Secondary | ICD-10-CM | POA: Diagnosis not present

## 2017-06-02 DIAGNOSIS — G250 Essential tremor: Secondary | ICD-10-CM | POA: Diagnosis not present

## 2017-06-02 DIAGNOSIS — Z23 Encounter for immunization: Secondary | ICD-10-CM | POA: Diagnosis not present

## 2017-06-02 DIAGNOSIS — R490 Dysphonia: Secondary | ICD-10-CM | POA: Diagnosis not present

## 2017-06-02 MED ORDER — LOSARTAN POTASSIUM 50 MG PO TABS: 50.0000 mg | ORAL_TABLET | Freq: Every day | ORAL | 3 refills | Status: DC

## 2017-06-02 MED ORDER — HYDROCHLOROTHIAZIDE 25 MG PO TABS: 25.0000 mg | ORAL_TABLET | Freq: Every day | ORAL | 3 refills | Status: AC

## 2017-06-02 NOTE — Progress Notes (Signed)
Subjective:    Patient ID: Carlos Hunt, male    DOB: 02/02/1946, 71 y.o.   MRN: [id number]  06/02/2017  Hypertension (6 month follow-up)   HPI This 71 y.o. male presents for evaluation of hypertension and essential tremor s/p bilateral VIM DBS on 10/07/16 with subsequent brain abscess formation warranting removal of LEFT DBS system on 03/10/17 by [name].  Followed by [name] of ID; s/p six weeks of iv rocephin via PICC line.   Pt desperately wants LEFT DBS replaced but advised must wait six months from completion of iv Ceftriaxone before will consider.  cx grew Propionibacterium.    PICC line placed for two months; unable to work.  Finally had removed.  Rocephin twice daily for eight weeks.   Passed physical and speech therapy just fine.  Talked to some other people who had a frog in throat; would like to switch to something else.  Tremors are back in RIGHT hand; will not discuss replacing for two months.    S/p general surgery consultation for inguinal hernia on the RIGHT; will not operate for several months after brain abscess.    BP Readings from Last 3 Encounters:  06/04/17 (!) 153/70  06/02/17 132/70  04/13/17 (!) 177/79   Wt Readings from Last 3 Encounters:  06/04/17 211 lb (95.7 kg)  06/02/17 209 lb (94.8 kg)  04/13/17 214 lb (97.1 kg)   Immunization History  Administered Date(s) Administered  . Influenza, Seasonal, Injecte, Preservative Fre 09/23/2012  . Influenza,inj,Quad PF,6+ Mos 07/27/2013, 05/25/2015, 06/03/2016, 06/02/2017  . Influenza-Unspecified 06/08/2014  . Pneumococcal Conjugate-13 11/13/2014  . Pneumococcal Polysaccharide-23 11/27/2015  . Tdap 09/23/2012    Review of Systems  Constitutional: Negative for activity change, appetite change, chills, diaphoresis, fatigue and fever.  Respiratory: Negative for cough and shortness of breath.   Cardiovascular: Negative for chest pain, palpitations and leg swelling.  Gastrointestinal: Negative for abdominal  distention, abdominal pain, anal bleeding, blood in stool, constipation, diarrhea, nausea, rectal pain and vomiting.  Endocrine: Negative for cold intolerance, heat intolerance, polydipsia, polyphagia and polyuria.  Skin: Negative for color change, rash and wound.  Neurological: Positive for tremors. Negative for dizziness, seizures, syncope, facial asymmetry, speech difficulty, weakness, light-headedness, numbness and headaches.  Psychiatric/Behavioral: Negative for dysphoric mood and sleep disturbance. The patient is not nervous/anxious.     Past Medical History:  Diagnosis Date  . Adenomatous polyps 04/02/2017  . Blood in stool   . Brain abscess 03/11/2017  . Erectile dysfunction   . Essential hypertension, benign 02/14/2013  . Essential tremor   . GERD (gastroesophageal reflux disease)   . Hx of colonic polyps   . Hypertension   . Pain in left knee   . Pain, joint, shoulder   . PONV (postoperative nausea and vomiting)   . Seasonal allergies   . Testicular hypofunction   . Tobacco use disorder   . Tremor   . Unspecified vitamin D deficiency    Past Surgical History:  Procedure Laterality Date  . COLONOSCOPY  09/09/2011   normal.  Previous polyps.  [name]. Repeat 5 years.  . COLONOSCOPY N/A 07/18/2016   Procedure: COLONOSCOPY;  Surgeon: Manya Silvas, MD;  Location: [facility] ENDOSCOPY;  Service: Endoscopy;  Laterality: N/A;  . HAND SURGERY Left 2011   trauma  . PULSE GENERATOR IMPLANT Bilateral 10/23/2016   Procedure: BILATERAL IMPLANTABLE PULSE GENERATOR;  Surgeon: Erline Levine, MD;  Location: [facility];  Service: Neurosurgery;  Laterality: Bilateral;  BILATERAL IMPLANTABLE PULSE GENERATOR  .  SUBTHALAMIC STIMULATOR INSERTION Bilateral 10/07/2016   Procedure: BILATERAL DEEP BRAIN STIMULATOR PLACEMENT;  Surgeon: Erline Levine, MD;  Location: [facility];  Service: Neurosurgery;  Laterality: Bilateral;  BILATERAL DEEP BRAIN STIMULATOR PLACEMENT  . SUBTHALAMIC STIMULATOR REMOVAL Left 03/10/2017    Procedure: REMOVAL DEEP BRAIN STIMULATOR AND ASPIRATION OF BRAIN ABSCESS;  Surgeon: Earnie Larsson, MD;  Location: [facility];  Service: Neurosurgery;  Laterality: Left;   Allergies  Allergen Reactions  . Procaine Nausea And Vomiting    NOVOCAINE    Social History   Social History  . Marital status: Married    Spouse name: N/A  . Number of children: 4  . Years of education: N/A   Occupational History  . building maintenance     [employer]   Social History Main Topics  . Smoking status: Former Smoker    Packs/day: 3.00    Years: 10.00    Types: Cigarettes    Quit date: 09/23/1970  . Smokeless tobacco: Never Used     Comment: quit in the 1970's  . Alcohol use 0.0 oz/week     Comment: occasional; once every 6 months  . Drug use: No  . Sexual activity: Yes   Other Topics Concern  . Not on file   Social History Narrative   Marital status: married x 45 years.      Children: 2 daughters, 2 sons; 6 grandchildren.      Lives: with wife.        Employment:  [employer] x 13 years; moderately happy.  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      Tobacco: quit 42 years ago; smoked x 10 years.       Alcohol: socially; weekends.  Rarely.      Drugs: none      Exercise:  None; work is physical.        Seatbelt: 100%      Sunscreen: rare sun exposure      Guns:  4 gun; unloaded, not stored in locked cabinet.       Smoke alarm and carbon monoxide detector in the home.      Caffeine use: consumes a minimal amount   Living Will: patient does NOT have Living Will; Does NOT have HCPOA; getting ready to work on it   Organ Donor: NO         Family History  Problem Relation Age of Onset  . Dementia Mother   . Parkinson's disease Father   . Cancer Father 55       prostate  . Heart disease Father 64       CAD/CABG  . Hypertension Father   . Hyperlipidemia Father   . Parkinsonism Father   . Parkinson's disease Brother   . Diabetes Sister        Objective:    BP 132/70    Pulse 72   Temp 98 F (36.7 C) (Oral)   Resp 16   Ht 5' 10.08" (1.78 m)   Wt 209 lb (94.8 kg)   SpO2 96%   BMI 29.92 kg/m  Physical Exam  Constitutional: He is oriented to person, place, and time. He appears well-developed and well-nourished. No distress.  HENT:  Head: Normocephalic and atraumatic.  Right Ear: External ear normal.  Left Ear: External ear normal.  Nose: Nose normal.  Mouth/Throat: Oropharynx is clear and moist.  Eyes: Pupils are equal, round, and reactive to light. Conjunctivae and EOM are normal.  Neck: Normal range of motion. Neck  supple. Carotid bruit is not present. No thyromegaly present.  Cardiovascular: Normal rate, regular rhythm, normal heart sounds and intact distal pulses.  Exam reveals no gallop and no friction rub.   No murmur heard. Pulmonary/Chest: Effort normal and breath sounds normal. He has no wheezes. He has no rales.  Abdominal: Soft. Bowel sounds are normal. He exhibits no distension and no mass. There is no tenderness. There is no rebound and no guarding.  Lymphadenopathy:    He has no cervical adenopathy.  Neurological: He is alert and oriented to person, place, and time. He displays tremor. No cranial nerve deficit.  Slight tremor RIGHT upper extremity.  Skin: Skin is warm and dry. No rash noted. He is not diaphoretic.  Psychiatric: He has a normal mood and affect. His behavior is normal.  Nursing note and vitals reviewed.   No results found. Depression screen [facility] 2/9 06/04/2017 06/02/2017 04/13/2017 03/20/2017 12/03/2016  Decreased Interest 0 0 0 0 0  Down, Depressed, Hopeless 0 0 0 0 0  PHQ - 2 Score 0 0 0 0 0   Fall Risk  06/04/2017 06/02/2017 04/13/2017 03/31/2017 03/20/2017  Falls in the past year? No No No No No  Number falls in past yr: - - - - -  Injury with Fall? - - - - -  Follow up - - - - -        Assessment & Plan:   1. Essential hypertension, benign   2. Essential tremor   3. Brain abscess   4. Right inguinal hernia   5.  Need for prophylactic vaccination and inoculation against influenza   6. Hoarseness    -due to chronic hoarseness, will switch Lisinopril to Losartan 27m daily. Obtain labs.   -if hoarseness persists, will warrant ENT consultation +/- treatment of allergic rhinitis and/or GERD. -s/p six weeks of ceftriaxone for brain abscess; follow-up with ID in upcoming week. -has very minimal RIGHT upper extremity tremor at this time; s/p recent follow-up with neurology; desires replacement of stimulator after six weeks. -s/p general surgery consultation for R inguinal hernia; must wait for surgical repair considering recent brain abscess.   Orders Placed This Encounter  Procedures  . Flu Vaccine QUAD 36+ mos IM  . CBC with Differential/Platelet  . Comprehensive metabolic panel   Meds ordered this encounter  Medications  . losartan (COZAAR) 50 MG tablet    Sig: Take 1 tablet (50 mg total) by mouth daily.    Dispense:  90 tablet    Refill:  3  . hydrochlorothiazide (HYDRODIURIL) 25 MG tablet    Sig: Take 1 tablet (25 mg total) by mouth daily.    Dispense:  90 tablet    Refill:  3    Return in about 6 months (around 11/30/2017) for complete physical examiniation.   Yeriel Mineo MElayne Guerin M.D. [facility]  [phone] [phone]

## 2017-06-02 NOTE — Patient Instructions (Addendum)
IF you received an x-ray today, you will receive an invoice from [facility]. Please contact [facility] at [phone] with questions or concerns regarding your invoice.   IF you received labwork today, you will receive an invoice from [facility]. Please contact [facility] at [phone] with questions or concerns regarding your invoice.   Our billing staff will not be able to assist you with questions regarding bills from these companies.  You will be contacted with the lab results as soon as they are available. The fastest way to get your results is to activate your My Chart account. Instructions are located on the last page of this paperwork. If you have not heard from [place] regarding the results in 2 weeks, please contact this office.      Managing Your Hypertension Hypertension is commonly called high blood pressure. This is when the force of your blood pressing against the walls of your arteries is too strong. Arteries are blood vessels that carry blood from your heart throughout your body. Hypertension forces the heart to work harder to pump blood, and may cause the arteries to become narrow or stiff. Having untreated or uncontrolled hypertension can cause heart attack, stroke, kidney disease, and other problems. What are blood pressure readings? A blood pressure reading consists of a higher number over a lower number. Ideally, your blood pressure should be below 120/80. The first ("top") number is called the systolic pressure. It is a measure of the pressure in your arteries as your heart beats. The second ("bottom") number is called the diastolic pressure. It is a measure of the pressure in your arteries as the heart relaxes. What does my blood pressure reading mean? Blood pressure is classified into four stages. Based on your blood pressure reading, your health care provider may use the following stages to determine what type of treatment you need, if any. Systolic  pressure and diastolic pressure are measured in a unit called mm Hg. Normal  Systolic pressure: below 546.  Diastolic pressure: below 80. Elevated  Systolic pressure: 568-127.  Diastolic pressure: below 80. Hypertension stage 1  Systolic pressure: 517-001.  Diastolic pressure: 74-94. Hypertension stage 2  Systolic pressure: 496 or above.  Diastolic pressure: 90 or above. What health risks are associated with hypertension? Managing your hypertension is an important responsibility. Uncontrolled hypertension can lead to:  A heart attack.  A stroke.  A weakened blood vessel (aneurysm).  Heart failure.  Kidney damage.  Eye damage.  Metabolic syndrome.  Memory and concentration problems.  What changes can I make to manage my hypertension? Hypertension can be managed by making lifestyle changes and possibly by taking medicines. Your health care provider will help you make a plan to bring your blood pressure within a normal range. Eating and drinking  Eat a diet that is high in fiber and potassium, and low in salt (sodium), added sugar, and fat. An example eating plan is called the DASH (Dietary Approaches to Stop Hypertension) diet. To eat this way: ? Eat plenty of fresh fruits and vegetables. Try to fill half of your plate at each meal with fruits and vegetables. ? Eat whole grains, such as whole wheat pasta, brown rice, or whole grain bread. Fill about one quarter of your plate with whole grains. ? Eat low-fat diary products. ? Avoid fatty cuts of meat, processed or cured meats, and poultry with skin. Fill about one quarter of your plate with lean proteins such as fish, chicken without skin, beans, eggs,  and tofu. ? Avoid premade and processed foods. These tend to be higher in sodium, added sugar, and fat.  Reduce your daily sodium intake. Most people with hypertension should eat less than 1,500 mg of sodium a day.  Limit alcohol intake to no more than 1 drink a day  for nonpregnant women and 2 drinks a day for men. One drink equals 12 oz of beer, 5 oz of wine, or 1 oz of hard liquor. Lifestyle  Work with your health care provider to maintain a healthy body weight, or to lose weight. Ask what an ideal weight is for you.  Get at least 30 minutes of exercise that causes your heart to beat faster (aerobic exercise) most days of the week. Activities may include walking, swimming, or biking.  Include exercise to strengthen your muscles (resistance exercise), such as weight lifting, as part of your weekly exercise routine. Try to do these types of exercises for 30 minutes at least 3 days a week.  Do not use any products that contain nicotine or tobacco, such as cigarettes and e-cigarettes. If you need help quitting, ask your health care provider.  Control any long-term (chronic) conditions you have, such as high cholesterol or diabetes. Monitoring  Monitor your blood pressure at home as told by your health care provider. Your personal target blood pressure may vary depending on your medical conditions, your age, and other factors.  Have your blood pressure checked regularly, as often as told by your health care provider. Working with your health care provider  Review all the medicines you take with your health care provider because there may be side effects or interactions.  Talk with your health care provider about your diet, exercise habits, and other lifestyle factors that may be contributing to hypertension.  Visit your health care provider regularly. Your health care provider can help you create and adjust your plan for managing hypertension. Will I need medicine to control my blood pressure? Your health care provider may prescribe medicine if lifestyle changes are not enough to get your blood pressure under control, and if:  Your systolic blood pressure is 130 or higher.  Your diastolic blood pressure is 80 or higher.  Take medicines only as told  by your health care provider. Follow the directions carefully. Blood pressure medicines must be taken as prescribed. The medicine does not work as well when you skip doses. Skipping doses also puts you at risk for problems. Contact a health care provider if:  You think you are having a reaction to medicines you have taken.  You have repeated (recurrent) headaches.  You feel dizzy.  You have swelling in your ankles.  You have trouble with your vision. Get help right away if:  You develop a severe headache or confusion.  You have unusual weakness or numbness, or you feel faint.  You have severe pain in your chest or abdomen.  You vomit repeatedly.  You have trouble breathing. Summary  Hypertension is when the force of blood pumping through your arteries is too strong. If this condition is not controlled, it may put you at risk for serious complications.  Your personal target blood pressure may vary depending on your medical conditions, your age, and other factors. For most people, a normal blood pressure is less than 120/80.  Hypertension is managed by lifestyle changes, medicines, or both. Lifestyle changes include weight loss, eating a healthy, low-sodium diet, exercising more, and limiting alcohol. This information is not intended to replace advice  given to you by your health care provider. Make sure you discuss any questions you have with your health care provider. Document Released: 05/19/2012 Document Revised: 07/23/2016 Document Reviewed: 07/23/2016 Elsevier Interactive Patient Education  [employer].

## 2017-06-03 LAB — COMPREHENSIVE METABOLIC PANEL
ALT: 63 IU/L — ABNORMAL HIGH (ref 0–44)
AST: 41 IU/L — ABNORMAL HIGH (ref 0–40)
Albumin/Globulin Ratio: 1.8 (ref 1.2–2.2)
Albumin: 4.9 g/dL — ABNORMAL HIGH (ref 3.5–4.8)
Alkaline Phosphatase: 82 IU/L (ref 39–117)
BUN/Creatinine Ratio: 15 (ref 10–24)
BUN: 19 mg/dL (ref 8–27)
Bilirubin Total: 0.5 mg/dL (ref 0.0–1.2)
CO2: 24 mmol/L (ref 20–29)
Calcium: 10.2 mg/dL (ref 8.6–10.2)
Chloride: 100 mmol/L (ref 96–106)
Creatinine, Ser: 1.26 mg/dL (ref 0.76–1.27)
GFR calc Af Amer: 66 mL/min/{1.73_m2} (ref >59)
GFR calc non Af Amer: 57 mL/min/{1.73_m2} — ABNORMAL LOW (ref >59)
Globulin, Total: 2.7 g/dL (ref 1.5–4.5)
Glucose: 96 mg/dL (ref 65–99)
Potassium: 4.2 mmol/L (ref 3.5–5.2)
Sodium: 140 mmol/L (ref 134–144)
Total Protein: 7.6 g/dL (ref 6.0–8.5)

## 2017-06-03 LAB — CBC WITH DIFFERENTIAL/PLATELET
Basophils Absolute: 0.1 10*3/uL (ref 0.0–0.2)
Basos: 1 %
EOS (ABSOLUTE): 0.1 10*3/uL (ref 0.0–0.4)
Eos: 1 %
Hematocrit: 44.8 % (ref 37.5–51.0)
Hemoglobin: 15.8 g/dL (ref 13.0–17.7)
Immature Grans (Abs): 0 10*3/uL (ref 0.0–0.1)
Immature Granulocytes: 0 %
Lymphocytes Absolute: 2.3 10*3/uL (ref 0.7–3.1)
Lymphs: 21 %
MCH: 31.8 pg (ref 26.6–33.0)
MCHC: 35.3 g/dL (ref 31.5–35.7)
MCV: 90 fL (ref 79–97)
Monocytes Absolute: 0.8 10*3/uL (ref 0.1–0.9)
Monocytes: 8 %
Neutrophils Absolute: 7.5 10*3/uL — ABNORMAL HIGH (ref 1.4–7.0)
Neutrophils: 69 %
Platelets: 329 10*3/uL (ref 150–379)
RBC: 4.97 x10E6/uL (ref 4.14–5.80)
RDW: 14.1 % (ref 12.3–15.4)
WBC: 10.7 10*3/uL (ref 3.4–10.8)

## 2017-06-04 ENCOUNTER — Encounter: Payer: Self-pay | Admitting: Internal Medicine

## 2017-06-04 ENCOUNTER — Ambulatory Visit (INDEPENDENT_AMBULATORY_CARE_PROVIDER_SITE_OTHER): Payer: Medicare Other | Admitting: Internal Medicine

## 2017-06-04 DIAGNOSIS — G060 Intracranial abscess and granuloma: Secondary | ICD-10-CM

## 2017-06-04 DIAGNOSIS — I63312 Cerebral infarction due to thrombosis of left middle cerebral artery: Secondary | ICD-10-CM

## 2017-06-04 NOTE — Progress Notes (Signed)
[facility]ious [facility]  Patient Active Problem List   Diagnosis Date Noted  . Brain abscess 03/11/2017    Priority: High  . Adenomatous polyps 04/02/2017  . Essential tremor 08/07/2014  . Essential hypertension, benign 02/14/2013    Patient's Medications  New Prescriptions   No medications on file  Previous Medications   ASPIRIN 325 MG TABLET    Take 325 mg by mouth daily.   CHOLECALCIFEROL (VITAMIN D3) 2000 UNITS TABS    Take 2,000 Units by mouth daily.   CYANOCOBALAMIN (VITAMIN B12 PO)    Take 3,000 mcg by mouth daily. Reported on 08/27/2015   ECHINACEA 400 MG CAPS    Take 400 mg by mouth daily.   GLUCOSAMINE-CHONDROIT-MSM-C-MN PO    Take 1 tablet by mouth daily.    HYDROCHLOROTHIAZIDE (HYDRODIURIL) 25 MG TABLET    Take 1 tablet (25 mg total) by mouth daily.   LOSARTAN (COZAAR) 50 MG TABLET    Take 1 tablet (50 mg total) by mouth daily.   MULTIPLE VITAMINS-MINERALS (CENTRUM PO)    Take 1 tablet by mouth daily.    OMEGA-3 FATTY ACIDS (ULTRA OMEGA 3 PO)    Take 1 capsule by mouth 2 (two) times daily.    TETRAHYDROZ-GLYC-HYPROM-PEG (VISINE MAXIMUM REDNESS RELIEF OP)    Place 1 drop into both eyes daily as needed (for irritation).   Modified Medications   No medications on file  Discontinued Medications   No medications on file    Subjective: Mr. Losito is in for his routine follow-up visit. 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 The stimulator was removed, the abscess was drained and he received 6 weeks of IV ceftriaxone, completing therapy on 04/22/2017. His tremors in his right hand have been a little worse since the stimulator was removed but otherwise he is doing much better. He has not had any headache, language difficulties or right sided weakness.  Review of Systems: Review of Systems  Constitutional: Negative for chills, diaphoresis and fever.   Gastrointestinal: Negative for abdominal pain, diarrhea, nausea and vomiting.  Neurological: Positive for tremors. Negative for sensory change, speech change, focal weakness and headaches.    Past Medical History:  Diagnosis Date  . Adenomatous polyps 04/02/2017  . Blood in stool   . Brain abscess 03/11/2017  . Erectile dysfunction   . Essential hypertension, benign 02/14/2013  . Essential tremor   . GERD (gastroesophageal reflux disease)   . Hx of colonic polyps   . Hypertension   . Pain in left knee   . Pain, joint, shoulder   . PONV (postoperative nausea and vomiting)   . Seasonal allergies   . Testicular hypofunction   . Tobacco use disorder   . Tremor   . Unspecified vitamin D deficiency     Social History  Substance Use Topics  . Smoking status: Former Smoker    Packs/day: 3.00    Years: 10.00    Types: Cigarettes    Quit date: 09/23/1970  . Smokeless tobacco: Never Used     Comment: quit in the 1970's  . Alcohol use 0.0 oz/week     Comment: occasional; once every 6 months    Family History  Problem Relation Age of Onset  . Dementia Mother   . Parkinson's disease Father   . Cancer Father 59  prostate  . Heart disease Father 60       CAD/CABG  . Hypertension Father   . Hyperlipidemia Father   . Parkinsonism Father   . Parkinson's disease Brother   . Diabetes Sister     Allergies  Allergen Reactions  . Procaine Nausea And Vomiting    NOVOCAINE    Objective: Vitals:   06/04/17 0849  BP: (!) 153/70  Pulse: (!) 58  Temp: 97.6 F (36.4 C)  TempSrc: Oral  Weight: 211 lb (95.7 kg)  Height: 5' 11"  (1.803 m)   Body mass index is 29.43 kg/m.  Physical Exam  Constitutional: He is oriented to person, place, and time.  He is in good spirits.  HENT:  His scalp incisions are well-healed.  Cardiovascular: Normal rate and regular rhythm.   No murmur heard. Pulmonary/Chest: Effort normal and breath sounds normal. He has no wheezes. He has no rales.    Neurological: He is alert and oriented to person, place, and time. Gait normal.  Psychiatric: Mood and affect normal.    Lab Results    Problem List Items Addressed This Visit      High   Brain abscess    I'm quite hopeful that his brain abscess has been cured. He can follow up here as needed.          Michel Bickers, MD [facility] [phone] pager   [phone] cell 06/04/2017, 9:07 AM

## 2017-06-04 NOTE — Assessment & Plan Note (Signed)
I'm quite hopeful that his brain abscess has been cured. He can follow up here as needed.

## 2017-06-05 DIAGNOSIS — K4090 Unilateral inguinal hernia, without obstruction or gangrene, not specified as recurrent: Secondary | ICD-10-CM | POA: Insufficient documentation

## 2017-07-02 ENCOUNTER — Ambulatory Visit (INDEPENDENT_AMBULATORY_CARE_PROVIDER_SITE_OTHER): Payer: Medicare Other | Admitting: Surgery

## 2017-07-02 ENCOUNTER — Telehealth: Payer: Self-pay

## 2017-07-02 ENCOUNTER — Encounter: Payer: Self-pay | Admitting: Surgery

## 2017-07-02 VITALS — BP 163/77 | HR 76 | Temp 98.1°F | Ht 71.0 in | Wt 216.6 lb

## 2017-07-02 DIAGNOSIS — K4090 Unilateral inguinal hernia, without obstruction or gangrene, not specified as recurrent: Secondary | ICD-10-CM | POA: Diagnosis not present

## 2017-07-02 DIAGNOSIS — I63312 Cerebral infarction due to thrombosis of left middle cerebral artery: Secondary | ICD-10-CM

## 2017-07-02 NOTE — Progress Notes (Signed)
Outpatient Surgical Follow Up  07/02/2017  Carlos Hunt is an 71 y.o. male.   CC:RIH  HPI: This patient with a right inguinal hernia.  He is known about it for over a year.  He had a recent brain abscess secondary to a stimulator that had been placed for tremors.  That stimulator has been removed.  Patient has recurrent pain from his right inguinal hernia and states that he thinks is getting larger.  Surgery at this point.  He had a stimulator removed back in July and finished antibiotics in August.  Past Medical History:  Diagnosis Date  . Adenomatous polyps 04/02/2017  . Blood in stool   . Brain abscess 03/11/2017  . Erectile dysfunction   . Essential hypertension, benign 02/14/2013  . Essential tremor   . GERD (gastroesophageal reflux disease)   . Hx of colonic polyps   . Hypertension   . Pain in left knee   . Pain, joint, shoulder   . PONV (postoperative nausea and vomiting)   . Seasonal allergies   . Testicular hypofunction   . Tobacco use disorder   . Tremor   . Unspecified vitamin D deficiency     Past Surgical History:  Procedure Laterality Date  . COLONOSCOPY  09/09/2011   normal.  Previous polyps.  [name]. Repeat 5 years.  . COLONOSCOPY N/A 07/18/2016   Procedure: COLONOSCOPY;  Surgeon: Manya Silvas, MD;  Location: [facility] ENDOSCOPY;  Service: Endoscopy;  Laterality: N/A;  . HAND SURGERY Left 2011   trauma  . PULSE GENERATOR IMPLANT Bilateral 10/23/2016   Procedure: BILATERAL IMPLANTABLE PULSE GENERATOR;  Surgeon: Erline Levine, MD;  Location: [facility];  Service: Neurosurgery;  Laterality: Bilateral;  BILATERAL IMPLANTABLE PULSE GENERATOR  . SUBTHALAMIC STIMULATOR INSERTION Bilateral 10/07/2016   Procedure: BILATERAL DEEP BRAIN STIMULATOR PLACEMENT;  Surgeon: Erline Levine, MD;  Location: [facility];  Service: Neurosurgery;  Laterality: Bilateral;  BILATERAL DEEP BRAIN STIMULATOR PLACEMENT  . SUBTHALAMIC STIMULATOR REMOVAL Left 03/10/2017   Procedure: REMOVAL DEEP BRAIN STIMULATOR  AND ASPIRATION OF BRAIN ABSCESS;  Surgeon: Earnie Larsson, MD;  Location: [facility];  Service: Neurosurgery;  Laterality: Left;    Family History  Problem Relation Age of Onset  . Dementia Mother   . Parkinson's disease Father   . Cancer Father 23       prostate  . Heart disease Father 64       CAD/CABG  . Hypertension Father   . Hyperlipidemia Father   . Parkinsonism Father   . Parkinson's disease Brother   . Diabetes Sister     Social History:  reports that he quit smoking about 46 years ago. His smoking use included Cigarettes. He has a 30.00 pack-year smoking history. He has never used smokeless tobacco. He reports that he drinks alcohol. He reports that he does not use drugs.  Allergies:  Allergies  Allergen Reactions  . Procaine Nausea And Vomiting    NOVOCAINE  . Sulfacetamide Sodium-Sulfur Rash    Medications reviewed.   Review of Systems:   Review of Systems  Constitutional: Negative.   HENT: Negative.   Eyes: Negative.   Respiratory: Negative.   Cardiovascular: Negative.   Gastrointestinal: Negative.   Genitourinary: Negative.   Musculoskeletal: Negative.   Skin: Negative.   Neurological: Positive for tingling, tremors and headaches.  Endo/Heme/Allergies: Negative.   Psychiatric/Behavioral: Negative.      Physical Exam:  There were no vitals taken for this visit.  Physical Exam  Constitutional: He is oriented to  person, place, and time and well-developed, well-nourished, and in no distress. No distress.  HENT:  Head: Normocephalic and atraumatic.  Eyes: Pupils are equal, round, and reactive to light. Right eye exhibits no discharge. Left eye exhibits no discharge. No scleral icterus.  Neck: Normal range of motion.  Cardiovascular: Normal rate, regular rhythm and normal heart sounds.   Pulmonary/Chest: Effort normal and breath sounds normal. No respiratory distress. He has no wheezes. He has no rales.  Abdominal: Soft. He exhibits no distension. There is  no tenderness. There is no rebound and no guarding.  Umbilical hernia, Bohlken reducible and nontender nontender  Genitourinary:  Genitourinary Comments: Large right inguinal hernia extending into the scrotum with normal testicle.  No hernia on the left.  Musculoskeletal: Normal range of motion. He exhibits no edema.  Lymphadenopathy:    He has no cervical adenopathy.  Neurological: He is alert and oriented to person, place, and time.  Skin: Skin is warm and dry. No rash noted. He is not diaphoretic. No erythema.  Psychiatric: Mood and affect normal.  Vitals reviewed.     No results found for this or any previous visit (from the past 48 hour(s)). No results found.  Assessment/Plan:  This patient with a very symptomatic right inguinal hernia requesting repair.  He also has a Sena umbilical hernia.  I have discussed with him the options of observation and the rationale for offering surgery the risks of bleeding infection recurrence and cosmetic deformity as well as ischemic orchitis chronic pain syndrome and neuroma.  Of significance back in the summer he had a brain abscess which was treated by removal of his nerve stimulator and IV antibiotics for 8 weeks.  He finished his antibiotics in August.  He states that he feels well enough and wants to have surgery at this point.  I discussed the pathophysiology of brain swelling etc. related to general anesthesia so that he would understand the risks associated with this early on and why waiting a significant amount of time is warranted.  He understood and agreed with this plan.  Florene Glen, MD, FACS

## 2017-07-02 NOTE — Patient Instructions (Addendum)
We have seen you today for an Inguinal Hernia. We have scheduled your surgery on 11/ 7/18 at [facility] with [place].  You may get a "Hernia Truss" at a Medical Supply Store. A hernia truss will provide some counter pressure against your hernia and help control your pain and give you more comfort in that area.  A few in our area are:  [facility] [address] [place], [place] ([place], [place], [place] [phone]  [facility] [place], [place], [place] [phone]  If the hernia is causing more pain and has a firm bulge in this area, lie down and try to push the protruding part in slowly and gently. It is much easier to get this to go back in, when you are lying flat.  Highlighted below are the urgent symptoms with a Strangulated hernia. This is a medical emergency and if you experience any of these symptoms, go immediately to the emergency department.    Inguinal Hernia, Adult An inguinal hernia is when fat or the intestines push through the area where the leg meets the lower abdomen (groin) and create a rounded lump (bulge). This condition develops over time. There are three types of inguinal hernias. These types include:  Hernias that can be pushed back into the belly (are reducible).  Hernias that are not reducible (are incarcerated).  Hernias that are not reducible and lose their blood supply (are strangulated). This type of hernia requires emergency surgery.  What are the causes? This condition is caused by having a weak spot in the muscles or tissue. This weakness lets the hernia poke through. This condition can be triggered by:  Suddenly straining the muscles of the lower abdomen.  Lifting heavy objects.  Straining to have a bowel movement. Difficult bowel movements (constipation) can lead to this.  Coughing.  What increases the risk? This condition  is more likely to develop in:  Men.  Pregnant women.  People who: ? Are overweight. ? Work in jobs that require long periods of standing or heavy lifting. ? Have had an inguinal hernia before. ? Smoke or have lung disease. These factors can lead to long-lasting (chronic) coughing.  What are the signs or symptoms? Symptoms can depend on the size of the hernia. Often, a Saint[place] inguinal hernia has no symptoms. Symptoms of a larger hernia include:  A lump in the groin. This is easier to see when the person is standing. It might not be visible when he or she is lying down.  Pain or burning in the groin. This occurs especially when lifting, straining, or coughing.  A dull ache or a feeling of pressure in the groin.  A lump in the scrotum in men.  Symptoms of a strangulated inguinal hernia can include:  A bulge in the groin that is very painful and tender to the touch.  A bulge that turns red or purple.  Fever, nausea, and vomiting.  The inability to have a bowel movement or to pass gas.  How is this diagnosed? This condition is diagnosed with a medical history and physical exam. Your health care provider may feel your groin area and ask you to cough. How is this treated? Treatment for this condition varies depending on the size of your hernia and whether you have symptoms. If you do not have symptoms, your health care provider may have you watch your hernia carefully and come in for follow-up visits. If  your hernia is larger or if you have symptoms, your treatment will include surgery. Follow these instructions at home: Lifestyle  Drink enough fluid to keep your urine clear or pale yellow.  Eat a diet that includes a lot of fiber. Eat plenty of fruits, vegetables, and whole grains. Talk with your health care provider if you have questions.  Avoid lifting heavy objects.  Avoid standing for long periods of time.  Do not use tobacco products, including cigarettes, chewing  tobacco, or e-cigarettes. If you need help quitting, ask your health care provider.  Maintain a healthy weight. General instructions  Do not try to force the hernia back in.  Watch your hernia for any changes in color or size. Let your health care provider know if any changes occur.  Take over-the-counter and prescription medicines only as told by your health care provider.  Keep all follow-up visits as told by your health care provider. This is important. Contact a health care provider if:  You have a fever.  You have new symptoms.  Your symptoms get worse. Get help right away if:  You have pain in the groin that suddenly gets worse.  A bulge in the groin gets bigger suddenly and does not go down.  You are a man and you have a sudden pain in the scrotum, or the size of your scrotum suddenly changes.  A bulge in the groin area becomes red or purple and is painful to the touch.  You have nausea or vomiting that does not go away.  You feel your heart beating a lot more quickly than normal.  You cannot have a bowel movement or pass gas. This information is not intended to replace advice given to you by your health care provider. Make sure you discuss any questions you have with your health care provider. Document Released: 01/11/2009 Document Revised: 01/31/2016 Document Reviewed: 07/05/2014 Elsevier Interactive Patient Education  2018 [employer].

## 2017-07-02 NOTE — Telephone Encounter (Signed)
Neurology Clearance faxed to Dr.Joseph Vertell Limber at this time.  Medical Clearance faxed to Dr.Kristi Tamala Julian at this time.  Cardiac Clearance faxed to Dr.Arida Muhammad at this time.

## 2017-07-06 ENCOUNTER — Telehealth: Payer: Self-pay | Admitting: Surgery

## 2017-07-06 NOTE — Telephone Encounter (Signed)
Patient returned phone call. All information below was given.  Patient was also told to stop his Fish Oil, ASA, and Echinacea on 07/09/17. He will hold all of these medications until after surgery.

## 2017-07-06 NOTE — Telephone Encounter (Signed)
I have called patient to go over surgery information below. No answer. I have left a message for patient to call back.   Please advise Pt of pre op date/time and sx date. Sx: 07/15/17 with Dr Maeola Sarah inguinal (right) hernia repair with umbilical hernia repair.  Pre op: 07/09/17 @ 10:45am--Office interview.   Patient made aware to call [phone], between 1-3:00pm the day before surgery, to find out what time to arrive.

## 2017-07-07 ENCOUNTER — Ambulatory Visit (INDEPENDENT_AMBULATORY_CARE_PROVIDER_SITE_OTHER): Payer: Medicare Other | Admitting: Internal Medicine

## 2017-07-07 ENCOUNTER — Telehealth: Payer: Self-pay | Admitting: Cardiovascular Disease

## 2017-07-07 ENCOUNTER — Encounter: Payer: Self-pay | Admitting: Internal Medicine

## 2017-07-07 ENCOUNTER — Telehealth: Payer: Self-pay

## 2017-07-07 VITALS — BP 156/70 | HR 53 | Ht 71.0 in | Wt 215.2 lb

## 2017-07-07 DIAGNOSIS — I10 Essential (primary) hypertension: Secondary | ICD-10-CM

## 2017-07-07 DIAGNOSIS — Z01810 Encounter for preprocedural cardiovascular examination: Secondary | ICD-10-CM | POA: Diagnosis not present

## 2017-07-07 DIAGNOSIS — R001 Bradycardia, unspecified: Secondary | ICD-10-CM

## 2017-07-07 NOTE — Telephone Encounter (Signed)
Neurology Clearance scanned, obtain, and approved at this time. Neurology Clearance can be found under Media Tab.

## 2017-07-07 NOTE — Patient Instructions (Signed)
Medication Instructions:  Your physician recommends that you continue on your current medications as directed. Please refer to the Current Medication list given to you today.  Your physician recommends you contact your PCP for adjustment of blood pressure medications.  Labwork: none  Testing/Procedures: none  Follow-Up: Your physician recommends that you schedule a follow-up appointment in: as needed.

## 2017-07-07 NOTE — Telephone Encounter (Signed)
Received cardiac clearance from [facility] for 11/7 right inguinal hernial repair with Dr. Burt Knack. Pt last saw cardiology Sept 2016. Dr. Saunders Revel agreeable to see patient.  I s/w pt's wife, [name], (on [place]) who is agreeable for pt appt today, October 30, 3pm, with Dr. Saunders Revel.

## 2017-07-07 NOTE — Progress Notes (Signed)
Outpatient cardiology consultation Date: 07/07/2017  Referring Provider: Phoebe Perch. MD [facility] [address] [place], [place] [address]  Chief Complaint: Preoperative evaluation for right groin hernia  HPI:  Mr. Slayden is a 71 y.o. male who is being seen today for the evaluation of preoperative cardiovascular risk assessment at the request of Dr. Burt Knack. He has a history of hypertension, GERD, and tremor status post bilateral deep brain stimulators complicated by infection and brain abscess involving the left-sided deep brain stimulator.  He first noticed a right groin hernia approximately a year ago.  He has been evaluated by Dr. Burt Knack and wishes to undergo surgical repair early next month (07/15/17).  Mr. Tappan was seen in our office in 2016 for assessment of a single episode of chest pain.  Subsequent myocardial perfusion stress test was low risk without evidence of ischemia or scar.  Since that time, he has not had any further episodes of chest pain.  He occasionally has been told by others around him that he seems to be out of breath, though Mr. Farish himself has not felt short of breath at all.  He also denies palpitations, lightheadedness, orthopnea, PND, and edema.  He exercises most days of the week at the senior center for 30-45 minutes at a time.  He is able to climb 2 flights of stairs without difficulty.  He denies prior perioperative complications.  --------------------------------------------------------------------------------------------------  Cardiovascular History & Procedures: Cardiovascular Problems:  Atypical chest pain  Risk Factors:  Hypertension, male gender, and age > 39  Cath/PCI:  None  CV Surgery:  None  EP Procedures and Devices:  None  Non-Invasive Evaluation(s):  Exercise MPI (06/22/15): Normal study without ischemia or scar. LVEF 53%. Baseline hypertension with hypertensive BP response.  Recent  CV Pertinent Labs: Lab Results  Component Value Date   CHOL 165 12/03/2016   HDL 43 12/03/2016   LDLCALC 93 12/03/2016   TRIG 144 12/03/2016   CHOLHDL 3.8 12/03/2016   CHOLHDL 3.6 11/27/2015   INR 1.04 03/10/2017   K 4.2 06/02/2017   BUN 19 06/02/2017   CREATININE 1.26 06/02/2017   CREATININE 1.11 06/03/2016    --------------------------------------------------------------------------------------------------  Past Medical History:  Diagnosis Date  . Adenomatous polyps 04/02/2017  . Blood in stool   . Brain abscess 03/11/2017  . Erectile dysfunction   . Essential hypertension, benign 02/14/2013  . Essential tremor   . GERD (gastroesophageal reflux disease)   . Hx of colonic polyps   . Hypertension   . Pain in left knee   . Pain, joint, shoulder   . PONV (postoperative nausea and vomiting)   . Seasonal allergies   . Testicular hypofunction   . Tobacco use disorder   . Tremor   . Unspecified vitamin D deficiency     Past Surgical History:  Procedure Laterality Date  . COLONOSCOPY  09/09/2011   normal.  Previous polyps.  [name]. Repeat 5 years.  . COLONOSCOPY N/A 07/18/2016   Procedure: COLONOSCOPY;  Surgeon: Manya Silvas, MD;  Location: [facility] ENDOSCOPY;  Service: Endoscopy;  Laterality: N/A;  . HAND SURGERY Left 2011   trauma  . PULSE GENERATOR IMPLANT Bilateral 10/23/2016   Procedure: BILATERAL IMPLANTABLE PULSE GENERATOR;  Surgeon: Erline Levine, MD;  Location: [place];  Service: Neurosurgery;  Laterality: Bilateral;  BILATERAL IMPLANTABLE PULSE GENERATOR  . SUBTHALAMIC STIMULATOR INSERTION Bilateral 10/07/2016   Procedure: BILATERAL DEEP BRAIN STIMULATOR PLACEMENT;  Surgeon: Erline Levine, MD;  Location: [place];  Service: Neurosurgery;  Laterality: Bilateral;  BILATERAL DEEP BRAIN STIMULATOR PLACEMENT  . SUBTHALAMIC STIMULATOR REMOVAL Left 03/10/2017   Procedure: REMOVAL DEEP BRAIN STIMULATOR AND ASPIRATION OF BRAIN ABSCESS;  Surgeon: Earnie Larsson, MD;  Location: [place];   Service: Neurosurgery;  Laterality: Left;    Current Meds  Medication Sig  . Cholecalciferol (VITAMIN D3) 2000 units TABS Take 2,000 Units by mouth daily.  . Cyanocobalamin (B-12) 3000 MCG CAPS Take 3,000 mcg by mouth daily.  . Digestive Enzymes (PAPAYA ENZYME) TABS Take 2 tablets by mouth daily as needed (heartburn).  . fluticasone (FLONASE) 50 MCG/ACT nasal spray Place 1 spray into both nostrils daily as needed for allergies or rhinitis.  Marland Kitchen GLUCOSAMINE-CHONDROIT-MSM-C-MN PO Take 1 tablet by mouth daily.   . hydrochlorothiazide (HYDRODIURIL) 25 MG tablet Take 1 tablet (25 mg total) by mouth daily.  Marland Kitchen losartan (COZAAR) 50 MG tablet Take 1 tablet (50 mg total) by mouth daily.  . Multiple Vitamins-Minerals (CENTRUM PO) Take 1 tablet by mouth daily.   . Tetrahydroz-Glyc-Hyprom-PEG (VISINE MAXIMUM REDNESS RELIEF OP) Place 1 drop into both eyes daily as needed (for irritation).     Allergies: Procaine and Sulfacetamide sodium-sulfur  Social History   Social History  . Marital status: Married    Spouse name: N/A  . Number of children: 4  . Years of education: N/A   Occupational History  . building maintenance     [employer]   Social History Main Topics  . Smoking status: Former Smoker    Packs/day: 3.00    Years: 10.00    Types: Cigarettes    Quit date: 09/23/1970  . Smokeless tobacco: Never Used     Comment: quit in the 1970's  . Alcohol use 0.0 oz/week     Comment: occasional; once every 6 months  . Drug use: No  . Sexual activity: Yes   Other Topics Concern  . Not on file   Social History Narrative   Marital status: married x 45 years.      Children: 2 daughters, 2 sons; 6 grandchildren.      Lives: with wife.        Employment:  [employer] x 13 years; moderately happy.  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      Tobacco: quit 42 years ago; smoked x 10 years.       Alcohol: socially; weekends.  Rarely.      Drugs: none      Exercise:  None; work is  physical.        Seatbelt: 100%      Sunscreen: rare sun exposure      Guns:  4 gun; unloaded, not stored in locked cabinet.       Smoke alarm and carbon monoxide detector in the home.      Caffeine use: consumes a minimal amount   Living Will: patient does NOT have Living Will; Does NOT have HCPOA; getting ready to work on it   Organ Donor: NO          Family History  Problem Relation Age of Onset  . Dementia Mother   . Parkinson's disease Father   . Cancer Father 76       prostate  . Heart disease Father 32       CAD/CABG  . Hypertension Father   . Hyperlipidemia Father   . Parkinsonism Father   . Parkinson's disease Brother   . Diabetes Sister     Review of Systems: A  12-system review of systems was performed and was negative except as noted in the HPI.  --------------------------------------------------------------------------------------------------  Physical Exam: BP (!) 156/70 (BP Location: Left Arm, Patient Position: Sitting, Cuff Size: Normal)   Pulse (!) 53   Ht 5' 11"  (1.803 m)   Wt 215 lb 4 oz (97.6 kg)   BMI 30.02 kg/m   General:  Obese man, seated comfortably in the exam room. HEENT: No conjunctival pallor or scleral icterus. Moist mucous membranes. OP clear. Neck: Supple without lymphadenopathy, thyromegaly, JVD, or HJR. No carotid bruit. Lungs: Normal work of breathing. Clear to auscultation bilaterally without wheezes or crackles. Heart: Bradycardiac but regular without murmurs, rubs, or gallops. Non-displaced PMI. Abd: Bowel sounds present. Soft, NT/ND without hepatosplenomegaly Ext: No lower extremity edema. Radial, PT, and DP pulses are 2+ bilaterally Skin: Warm and dry without rash. Neuro: CNIII-XII intact. Strength and fine-touch sensation intact in upper and lower extremities bilaterally. Psych: Normal mood and affect.  EKG:  Sinus bradycardia (HR 53 bpm). Otherwise, no significant abnormalities.  Lab Results  Component Value Date   WBC  10.7 06/02/2017   HGB 15.8 06/02/2017   HCT 44.8 06/02/2017   MCV 90 06/02/2017   PLT 329 06/02/2017    Lab Results  Component Value Date   NA 140 06/02/2017   K 4.2 06/02/2017   CL 100 06/02/2017   CO2 24 06/02/2017   BUN 19 06/02/2017   CREATININE 1.26 06/02/2017   GLUCOSE 96 06/02/2017   ALT 63 (H) 06/02/2017    Lab Results  Component Value Date   CHOL 165 12/03/2016   HDL 43 12/03/2016   LDLCALC 93 12/03/2016   TRIG 144 12/03/2016   CHOLHDL 3.8 12/03/2016   Lab Results  Component Value Date   TSH 2.49 11/27/2015   --------------------------------------------------------------------------------------------------  ASSESSMENT AND PLAN: Preoperative cardiovascular risk assessment Mr. Stradling is scheduled for right groin hernia repair next month. He does not have any unstable cardiac symptoms. I personally observed him climb 4 flights of stair without angina or significant shortness of breath. Myocardial perfusion stress test 2 years ago did not show any significant abnormalities. Mr. Aliano is low-risk for perioperative cardiovascular complications. I do not recommend any further cardiac testing prior to proceeding with his surgery.  Sinus bradycardia Resting heart rate found to be 53 bpm today. Mr. Cappella is asymptomatic and mounts an appropriate heart rate response with activity. No further workup is recommended at this time. Heart-rate suppressing medications should be avoided.  Hypertension Blood pressure is suboptimally controlled today, similar to prior visits noted in Epic. Mr. Degidio states that he was recently switched from an ACEI to losartan due to cough. I have advised Mr. Sheeley to follow-up with his PCP; uptitration of losartan and/or addition of another antihypertensive agent should be considered.  Follow-up: Return to clinic as needed.  Nelva Bush, MD 07/07/2017 11:43 PM

## 2017-07-08 ENCOUNTER — Telehealth: Payer: Self-pay | Admitting: Family Medicine

## 2017-07-08 ENCOUNTER — Telehealth: Payer: Self-pay

## 2017-07-08 ENCOUNTER — Telehealth: Payer: Self-pay | Admitting: *Deleted

## 2017-07-08 NOTE — Telephone Encounter (Signed)
Dr Crisoforo Oxford from [facility]  Has called to see if you had filled out paper work for medical clearance patient has surgery on Nov 7th and she states that she sent it on Oct 25th and will resend another one  There phone number is [phone]

## 2017-07-08 NOTE — Telephone Encounter (Signed)
Cardiac Clearance obtained from Dr.Christopher End at this time. May proceed with Scheduled surgery.  Please see office notes 07/07/17.

## 2017-07-08 NOTE — Telephone Encounter (Signed)
Gave message to [place] to check on [employer]

## 2017-07-08 NOTE — Telephone Encounter (Signed)
Clearance form from [facility] completed and signed by Dr End. Surgery Right Inguinal Hernia repair on 07/15/17 by Dr Phoebe Perch.

## 2017-07-08 NOTE — Telephone Encounter (Signed)
Cardiac Clearance obtained from [facility] at this time and scanned under Media.

## 2017-07-09 ENCOUNTER — Encounter
Admission: RE | Admit: 2017-07-09 | Discharge: 2017-07-09 | Disposition: A | Discharge status: Home / Self Care | Payer: Medicare Other | Source: Ambulatory Visit | Attending: Surgery | Admitting: Surgery

## 2017-07-09 DIAGNOSIS — Z01818 Encounter for other preprocedural examination: Secondary | ICD-10-CM | POA: Diagnosis not present

## 2017-07-09 DIAGNOSIS — K4090 Unilateral inguinal hernia, without obstruction or gangrene, not specified as recurrent: Secondary | ICD-10-CM | POA: Insufficient documentation

## 2017-07-09 LAB — BASIC METABOLIC PANEL
Anion gap: 10 (ref 5–15)
BUN: 20 mg/dL (ref 6–20)
CO2: 27 mmol/L (ref 22–32)
Calcium: 9.7 mg/dL (ref 8.9–10.3)
Chloride: 101 mmol/L (ref 101–111)
Creatinine, Ser: 1.03 mg/dL (ref 0.61–1.24)
GFR calc Af Amer: >60 mL/min (ref >60)
GFR calc non Af Amer: >60 mL/min (ref >60)
Glucose, Bld: 93 mg/dL (ref 65–99)
Potassium: 3.7 mmol/L (ref 3.5–5.1)
Sodium: 138 mmol/L (ref 135–145)

## 2017-07-09 LAB — CBC WITH DIFFERENTIAL/PLATELET
Basophils Absolute: 0.1 10*3/uL (ref 0–0.1)
Basophils Relative: 1 %
Eosinophils Absolute: 0.2 10*3/uL (ref 0–0.7)
Eosinophils Relative: 2 %
HCT: 45.8 % (ref 40.0–52.0)
Hemoglobin: 15.8 g/dL (ref 13.0–18.0)
Lymphocytes Relative: 24 %
Lymphs Abs: 2.7 10*3/uL (ref 1.0–3.6)
MCH: 31.9 pg (ref 26.0–34.0)
MCHC: 34.5 g/dL (ref 32.0–36.0)
MCV: 92.5 fL (ref 80.0–100.0)
Monocytes Absolute: 0.9 10*3/uL (ref 0.2–1.0)
Monocytes Relative: 8 %
Neutro Abs: 7.3 10*3/uL — ABNORMAL HIGH (ref 1.4–6.5)
Neutrophils Relative %: 65 %
Platelets: 339 10*3/uL (ref 150–440)
RBC: 4.95 MIL/uL (ref 4.40–5.90)
RDW: 13.9 % (ref 11.5–14.5)
WBC: 11.2 10*3/uL — ABNORMAL HIGH (ref 3.8–10.6)

## 2017-07-09 NOTE — Pre-Procedure Instructions (Addendum)
Pt has a brain stimulator on right side of head.  See copy of card in front of pt's chart.

## 2017-07-09 NOTE — Pre-Procedure Instructions (Signed)
Progress Notes Encounter Date: 07/07/2017 Nelva Bush, MD  Cardiology  Expand All Collapse All   [] Hide copied text   Outpatient cardiology consultation Date: 07/07/2017  Referring Provider: Phoebe Perch. MD [facility] [address] [place], [place] [address]  Chief Complaint: Preoperative evaluation for right groin hernia  HPI:  Carlos Hunt is a 71 y.o. male who is being seen today for the evaluation of preoperative cardiovascular risk assessment at the request of Dr. Burt Knack. He has a history of hypertension, GERD, and tremor status post bilateral deep brain stimulators complicated by infection and brain abscess involving the left-sided deep brain stimulator.  He first noticed a right groin hernia approximately a year ago.  He has been evaluated by Dr. Burt Knack and wishes to undergo surgical repair early next month (07/15/17).  Carlos Hunt was seen in our office in 2016 for assessment of a single episode of chest pain.  Subsequent myocardial perfusion stress test was low risk without evidence of ischemia or scar.  Since that time, he has not had any further episodes of chest pain.  He occasionally has been told by others around him that he seems to be out of breath, though Carlos Hunt himself has not felt short of breath at all.  He also denies palpitations, lightheadedness, orthopnea, PND, and edema.  He exercises most days of the week at the senior center for 30-45 minutes at a time.  He is able to climb 2 flights of stairs without difficulty.  He denies prior perioperative complications.  --------------------------------------------------------------------------------------------------  Cardiovascular History & Procedures: Cardiovascular Problems:  Atypical chest pain  Risk Factors:  Hypertension, male gender, and age > 22  Cath/PCI:  None  CV Surgery:  None  EP Procedures and Devices:  None  Non-Invasive  Evaluation(s):  Exercise MPI (06/22/15): Normal study without ischemia or scar. LVEF 53%. Baseline hypertension with hypertensive BP response.  Recent CV Pertinent Labs: Labs (Brief)       Lab Results  Component Value Date   CHOL 165 12/03/2016   HDL 43 12/03/2016   LDLCALC 93 12/03/2016   TRIG 144 12/03/2016   CHOLHDL 3.8 12/03/2016   CHOLHDL 3.6 11/27/2015   INR 1.04 03/10/2017   K 4.2 06/02/2017   BUN 19 06/02/2017   CREATININE 1.26 06/02/2017   CREATININE 1.11 06/03/2016      --------------------------------------------------------------------------------------------------      Past Medical History:  Diagnosis Date  . Adenomatous polyps 04/02/2017  . Blood in stool   . Brain abscess 03/11/2017  . Erectile dysfunction   . Essential hypertension, benign 02/14/2013  . Essential tremor   . GERD (gastroesophageal reflux disease)   . Hx of colonic polyps   . Hypertension   . Pain in left knee   . Pain, joint, shoulder   . PONV (postoperative nausea and vomiting)   . Seasonal allergies   . Testicular hypofunction   . Tobacco use disorder   . Tremor   . Unspecified vitamin D deficiency          Past Surgical History:  Procedure Laterality Date  . COLONOSCOPY  09/09/2011   normal.  Previous polyps.  [name]. Repeat 5 years.  . COLONOSCOPY N/A 07/18/2016   Procedure: COLONOSCOPY;  Surgeon: Manya Silvas, MD;  Location: [facility] ENDOSCOPY;  Service: Endoscopy;  Laterality: N/A;  . HAND SURGERY Left 2011   trauma  . PULSE GENERATOR IMPLANT Bilateral 10/23/2016   Procedure: BILATERAL IMPLANTABLE PULSE GENERATOR;  Surgeon: Erline Levine, MD;  Location: [place] [facility];  Service: Neurosurgery;  Laterality: Bilateral;  BILATERAL IMPLANTABLE PULSE GENERATOR  . SUBTHALAMIC STIMULATOR INSERTION Bilateral 10/07/2016   Procedure: BILATERAL DEEP BRAIN STIMULATOR PLACEMENT;  Surgeon: Erline Levine, MD;  Location: [place];  Service: Neurosurgery;  Laterality:  Bilateral;  BILATERAL DEEP BRAIN STIMULATOR PLACEMENT  . SUBTHALAMIC STIMULATOR REMOVAL Left 03/10/2017   Procedure: REMOVAL DEEP BRAIN STIMULATOR AND ASPIRATION OF BRAIN ABSCESS;  Surgeon: Earnie Larsson, MD;  Location: [place];  Service: Neurosurgery;  Laterality: Left;    Active Medications      Current Meds  Medication Sig  . Cholecalciferol (VITAMIN D3) 2000 units TABS Take 2,000 Units by mouth daily.  . Cyanocobalamin (B-12) 3000 MCG CAPS Take 3,000 mcg by mouth daily.  . Digestive Enzymes (PAPAYA ENZYME) TABS Take 2 tablets by mouth daily as needed (heartburn).  . fluticasone (FLONASE) 50 MCG/ACT nasal spray Place 1 spray into both nostrils daily as needed for allergies or rhinitis.  Marland Kitchen GLUCOSAMINE-CHONDROIT-MSM-C-MN PO Take 1 tablet by mouth daily.   . hydrochlorothiazide (HYDRODIURIL) 25 MG tablet Take 1 tablet (25 mg total) by mouth daily.  Marland Kitchen losartan (COZAAR) 50 MG tablet Take 1 tablet (50 mg total) by mouth daily.  . Multiple Vitamins-Minerals (CENTRUM PO) Take 1 tablet by mouth daily.   . Tetrahydroz-Glyc-Hyprom-PEG (VISINE MAXIMUM REDNESS RELIEF OP) Place 1 drop into both eyes daily as needed (for irritation).       Allergies: Procaine and Sulfacetamide sodium-sulfur  Social History        Social History  . Marital status: Married    Spouse name: N/A  . Number of children: 4  . Years of education: N/A        Occupational History  . building maintenance     [employer]         Social History Main Topics  . Smoking status: Former Smoker    Packs/day: 3.00    Years: 10.00    Types: Cigarettes    Quit date: 09/23/1970  . Smokeless tobacco: Never Used     Comment: quit in the 1970's  . Alcohol use 0.0 oz/week     Comment: occasional; once every 6 months  . Drug use: No  . Sexual activity: Yes       Other Topics Concern  . Not on file      Social History Narrative   Marital status: married x 45 years.      Children: 2  daughters, 2 sons; 6 grandchildren.      Lives: with wife.        Employment:  [employer] x 13 years; moderately happy.  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      Tobacco: quit 42 years ago; smoked x 10 years.       Alcohol: socially; weekends.  Rarely.      Drugs: none      Exercise:  None; work is physical.        Seatbelt: 100%      Sunscreen: rare sun exposure      Guns:  4 gun; unloaded, not stored in locked cabinet.       Smoke alarm and carbon monoxide detector in the home.      Caffeine use: consumes a minimal amount   Living Will: patient does NOT have Living Will; Does NOT have HCPOA; getting ready to work on it   Organ Donor: NO               Family  History  Problem Relation Age of Onset  . Dementia Mother   . Parkinson's disease Father   . Cancer Father 22       prostate  . Heart disease Father 59       CAD/CABG  . Hypertension Father   . Hyperlipidemia Father   . Parkinsonism Father   . Parkinson's disease Brother   . Diabetes Sister     Review of Systems: A 12-system review of systems was performed and was negative except as noted in the HPI.  --------------------------------------------------------------------------------------------------  Physical Exam: BP (!) 156/70 (BP Location: Left Arm, Patient Position: Sitting, Cuff Size: Normal)   Pulse (!) 53   Ht 5' 11"  (1.803 m)   Wt 215 lb 4 oz (97.6 kg)   BMI 30.02 kg/m   General:  Obese man, seated comfortably in the exam room. HEENT: No conjunctival pallor or scleral icterus. Moist mucous membranes. OP clear. Neck: Supple without lymphadenopathy, thyromegaly, JVD, or HJR. No carotid bruit. Lungs: Normal work of breathing. Clear to auscultation bilaterally without wheezes or crackles. Heart: Bradycardiac but regular without murmurs, rubs, or gallops. Non-displaced PMI. Abd: Bowel sounds present. Soft, NT/ND without hepatosplenomegaly Ext: No lower extremity edema.  Radial, PT, and DP pulses are 2+ bilaterally Skin: Warm and dry without rash. Neuro: CNIII-XII intact. Strength and fine-touch sensation intact in upper and lower extremities bilaterally. Psych: Normal mood and affect.  EKG:  Sinus bradycardia (HR 53 bpm). Otherwise, no significant abnormalities.  Recent Labs       Lab Results  Component Value Date   WBC 10.7 06/02/2017   HGB 15.8 06/02/2017   HCT 44.8 06/02/2017   MCV 90 06/02/2017   PLT 329 06/02/2017      Recent Labs       Lab Results  Component Value Date   NA 140 06/02/2017   K 4.2 06/02/2017   CL 100 06/02/2017   CO2 24 06/02/2017   BUN 19 06/02/2017   CREATININE 1.26 06/02/2017   GLUCOSE 96 06/02/2017   ALT 63 (H) 06/02/2017      Recent Labs       Lab Results  Component Value Date   CHOL 165 12/03/2016   HDL 43 12/03/2016   LDLCALC 93 12/03/2016   TRIG 144 12/03/2016   CHOLHDL 3.8 12/03/2016     Recent Labs       Lab Results  Component Value Date   TSH 2.49 11/27/2015     --------------------------------------------------------------------------------------------------  ASSESSMENT AND PLAN: Preoperative cardiovascular risk assessment Carlos Hunt is scheduled for right groin hernia repair next month. He does not have any unstable cardiac symptoms. I personally observed him climb 4 flights of stair without angina or significant shortness of breath. Myocardial perfusion stress test 2 years ago did not show any significant abnormalities. Carlos Hunt is low-risk for perioperative cardiovascular complications. I do not recommend any further cardiac testing prior to proceeding with his surgery.  Sinus bradycardia Resting heart rate found to be 53 bpm today. Carlos Hunt is asymptomatic and mounts an appropriate heart rate response with activity. No further workup is recommended at this time. Heart-rate suppressing medications should be avoided.  Hypertension Blood pressure is  suboptimally controlled today, similar to prior visits noted in Epic. Carlos Hunt states that he was recently switched from an ACEI to losartan due to cough. I have advised Carlos Hunt to follow-up with his PCP; uptitration of losartan and/or addition of another antihypertensive agent should be considered.  Follow-up: Return to  clinic as needed.  Nelva Bush, MD 07/07/2017 11:43 PM

## 2017-07-09 NOTE — Pre-Procedure Instructions (Signed)
Spoke to Dr. Andree Elk regarding anesthesia consult, reviewed medical and surgical history, especially recent infection and removal of left brain stimulator.  Pt has been instructed to bring remote to turn off the stimulator during surgery.  Dr. Andree Elk informed this nurse that as long as pt's mentation is WNL, afebrile and we can use his remote to turn off brain stimulator we should be good to proceed.

## 2017-07-09 NOTE — Patient Instructions (Signed)
Your procedure is scheduled on: Wednesday Nov. 7, 2018 Report to Same day Surgery To find out your arrival time please call [phone] between 1PM - 3PM on Tuesday Nov. 6, 2018.  Remember: Instructions that are not followed completely may result in serious medical risk, up to and including death, or upon the discretion of your surgeon and anesthesiologist your surgery may need to be rescheduled.     _X__ 1. Do not eat food after midnight the night before your procedure.                 No gum chewing or hard candies. You may drink clear liquids up to 2 hours                 before you are scheduled to arrive for your surgery- DO not drink clear                 liquids within 2 hours of the start of your surgery.                 Clear Liquids include:  water, apple juice without pulp, clear carbohydrate                 drink such as Clearfast of Gart[place]ade, Black Coffee or Tea (Do not add                 anything to coffee or tea).     _X__ 2.  No Alcohol for 24 hours before or after surgery.   ___ 3.  Do Not Smoke or use e-cigarettes For 24 Hours Prior to Your Surgery.                 Do not use any chewable tobacco products for at least 6 hours prior to                 surgery.  ____  4.  Bring all medications with you on the day of surgery if instructed.   __x__  5.  Notify your doctor if there is any change in your medical condition      (cold, fever, infections).     Do not wear jewelry, make-up, hairpins, clips or nail polish. Do not wear lotions, powders, or perfumes. You may wear deodorant. Do not shave 48 hours prior to surgery. Men may shave face and neck. Do not bring valuables to the hospital.    [facility] is not responsible for any belongings or valuables.  Contacts, dentures or bridgework may not be worn into surgery. Leave your suitcase in the car. After surgery it may be brought to your room. For patients admitted to the hospital, discharge  time is determined by your treatment team.   Patients discharged the day of surgery will not be allowed to drive home.   Please read over the following fact sheets that you were given:   Preparing for surgery   ____ Take these medicines the morning of surgery with A SIP OF WATER: NONE     ____ Fleet Enema (as directed)   __x__ Use CHG Soap as directed  ____ Use inhalers on the day of surgery  ____ Stop metformin 2 days prior to surgery    ____ Take 1/2 of usual insulin dose the night before surgery. No insulin the morning          of surgery.   __x__ Already stopped aspirin on 07/07/17.  __x__ Stop Anti-inflammatories on Advil, Aleve,  Motrin, Ibuprofen, Naproxen, Naprosyn, Goodies Powders or Aspirin  products.   _x___ Stop supplements:Digestive Enzymes (PAPAYA ENZYME),Echinacea,   GLUCOSAMINE-CHONDROIT-MSM-C-MN, Omega-3 Fatty Acids (ULTRA OMEGA 3 PO)  until after surgery.    ____ Bring C-Pap to the hospital.

## 2017-07-10 ENCOUNTER — Telehealth: Payer: Self-pay

## 2017-07-10 NOTE — Telephone Encounter (Signed)
Cardiac clearance obtain and approved. Cardiac clearance can be found in patient's chart under medica tab.

## 2017-07-11 NOTE — Telephone Encounter (Signed)
Gave paper work to Dr. Tamala Julian so she can complete the paper work.

## 2017-07-13 ENCOUNTER — Telehealth: Payer: Self-pay

## 2017-07-13 NOTE — Telephone Encounter (Signed)
Medical Clearance faxed again to Dr.Kristi [facility] @ [phone] and [phone].

## 2017-07-14 MED ORDER — CEFAZOLIN SODIUM-DEXTROSE 2-4 GM/100ML-% IV SOLN
2.0000 g | INTRAVENOUS | Status: AC
  Administered 2017-07-15: 2 g via INTRAVENOUS

## 2017-07-14 NOTE — Telephone Encounter (Signed)
Medical clearance for inguinal surgical repair for 07/15/17; will fax medical clearance form to [facility].

## 2017-07-15 ENCOUNTER — Inpatient Hospital Stay: Payer: Medicare Other | Admitting: Certified Registered"

## 2017-07-15 ENCOUNTER — Ambulatory Visit
Admission: RE | Admit: 2017-07-15 | Discharge: 2017-07-15 | Disposition: A | Discharge status: Home / Self Care | Payer: Medicare Other | Source: Ambulatory Visit | Attending: Surgery | Admitting: Surgery

## 2017-07-15 ENCOUNTER — Encounter: Payer: Self-pay | Admitting: *Deleted

## 2017-07-15 ENCOUNTER — Telehealth: Payer: Self-pay

## 2017-07-15 ENCOUNTER — Encounter
Admission: RE | Disposition: A | Discharge status: Home / Self Care | Payer: Self-pay | Source: Ambulatory Visit | Attending: Surgery

## 2017-07-15 DIAGNOSIS — K219 Gastro-esophageal reflux disease without esophagitis: Secondary | ICD-10-CM | POA: Diagnosis not present

## 2017-07-15 DIAGNOSIS — K429 Umbilical hernia without obstruction or gangrene: Secondary | ICD-10-CM | POA: Diagnosis not present

## 2017-07-15 DIAGNOSIS — D176 Benign lipomatous neoplasm of spermatic cord: Secondary | ICD-10-CM | POA: Insufficient documentation

## 2017-07-15 DIAGNOSIS — I10 Essential (primary) hypertension: Secondary | ICD-10-CM | POA: Diagnosis not present

## 2017-07-15 DIAGNOSIS — Z87891 Personal history of nicotine dependence: Secondary | ICD-10-CM | POA: Insufficient documentation

## 2017-07-15 DIAGNOSIS — K4090 Unilateral inguinal hernia, without obstruction or gangrene, not specified as recurrent: Secondary | ICD-10-CM

## 2017-07-15 HISTORY — PX: UMBILICAL HERNIA REPAIR: SHX196

## 2017-07-15 HISTORY — PX: LAPAROSCOPIC INGUINAL HERNIA WITH UMBILICAL HERNIA: SHX5658

## 2017-07-15 SURGERY — LAPAROSCOPIC INGUINAL HERNIA WITH UMBILICAL HERNIA
Anesthesia: General | Site: Inguinal | Laterality: Right | Wound class: Clean

## 2017-07-15 MED ORDER — GLYCOPYRROLATE 0.2 MG/ML IJ SOLN
INTRAMUSCULAR | Status: AC
  Filled 2017-07-15: qty 1

## 2017-07-15 MED ORDER — ACETAMINOPHEN NICU IV SYRINGE 10 MG/ML
INTRAVENOUS | Status: AC
  Filled 2017-07-15: qty 1

## 2017-07-15 MED ORDER — ONDANSETRON HCL 4 MG/2ML IJ SOLN
4.0000 mg | Freq: Once | INTRAMUSCULAR | Status: AC | PRN
  Administered 2017-07-15: 4 mg via INTRAVENOUS

## 2017-07-15 MED ORDER — SUCCINYLCHOLINE CHLORIDE 20 MG/ML IJ SOLN
INTRAMUSCULAR | Status: AC
  Filled 2017-07-15: qty 1

## 2017-07-15 MED ORDER — DEXAMETHASONE SODIUM PHOSPHATE 10 MG/ML IJ SOLN
INTRAMUSCULAR | Status: AC
  Filled 2017-07-15: qty 1

## 2017-07-15 MED ORDER — ROCURONIUM BROMIDE 50 MG/5ML IV SOLN
INTRAVENOUS | Status: AC
  Filled 2017-07-15: qty 1

## 2017-07-15 MED ORDER — FENTANYL CITRATE (PF) 100 MCG/2ML IJ SOLN
INTRAMUSCULAR | Status: AC
  Filled 2017-07-15: qty 2

## 2017-07-15 MED ORDER — LIDOCAINE HCL (PF) 2 % IJ SOLN
INTRAMUSCULAR | Status: AC
  Filled 2017-07-15: qty 10

## 2017-07-15 MED ORDER — FAMOTIDINE 20 MG PO TABS
20.0000 mg | ORAL_TABLET | Freq: Once | ORAL | Status: AC
  Administered 2017-07-15: 20 mg via ORAL

## 2017-07-15 MED ORDER — HYDROCODONE-ACETAMINOPHEN 5-300 MG PO TABS: 1.0000 | ORAL_TABLET | ORAL | 0 refills | Status: DC | PRN

## 2017-07-15 MED ORDER — PROPOFOL 10 MG/ML IV BOLUS
INTRAVENOUS | Status: AC
  Filled 2017-07-15: qty 20

## 2017-07-15 MED ORDER — LIDOCAINE HCL (CARDIAC) 20 MG/ML IV SOLN
INTRAVENOUS | Status: DC | PRN
  Administered 2017-07-15: 80 mg via INTRAVENOUS

## 2017-07-15 MED ORDER — PHENYLEPHRINE HCL 10 MG/ML IJ SOLN
INTRAMUSCULAR | Status: AC
  Filled 2017-07-15: qty 1

## 2017-07-15 MED ORDER — ONDANSETRON HCL 4 MG/2ML IJ SOLN
INTRAMUSCULAR | Status: AC
  Filled 2017-07-15: qty 2

## 2017-07-15 MED ORDER — PROPOFOL 10 MG/ML IV BOLUS
INTRAVENOUS | Status: DC | PRN
  Administered 2017-07-15: 110 mg via INTRAVENOUS

## 2017-07-15 MED ORDER — DEXAMETHASONE SODIUM PHOSPHATE 10 MG/ML IJ SOLN
INTRAMUSCULAR | Status: DC | PRN
  Administered 2017-07-15: 4 mg via INTRAVENOUS

## 2017-07-15 MED ORDER — LACTATED RINGERS IV SOLN
INTRAVENOUS | Status: DC
  Administered 2017-07-15: 09:00:00 via INTRAVENOUS

## 2017-07-15 MED ORDER — SUGAMMADEX SODIUM 200 MG/2ML IV SOLN
INTRAVENOUS | Status: AC
  Filled 2017-07-15: qty 2

## 2017-07-15 MED ORDER — FENTANYL CITRATE (PF) 100 MCG/2ML IJ SOLN: 25.0000 ug | INTRAMUSCULAR | Status: DC | PRN

## 2017-07-15 MED ORDER — CHLORHEXIDINE GLUCONATE CLOTH 2 % EX PADS: 6.0000 | MEDICATED_PAD | Freq: Once | CUTANEOUS | Status: DC

## 2017-07-15 MED ORDER — BUPIVACAINE-EPINEPHRINE 0.5% -1:200000 IJ SOLN
INTRAMUSCULAR | Status: DC | PRN
  Administered 2017-07-15: 30 mL

## 2017-07-15 MED ORDER — CEFAZOLIN SODIUM-DEXTROSE 2-4 GM/100ML-% IV SOLN
INTRAVENOUS | Status: AC
  Filled 2017-07-15: qty 100

## 2017-07-15 MED ORDER — SUCCINYLCHOLINE CHLORIDE 20 MG/ML IJ SOLN
INTRAMUSCULAR | Status: DC | PRN
  Administered 2017-07-15: 100 mg via INTRAVENOUS

## 2017-07-15 MED ORDER — ONDANSETRON HCL 4 MG/2ML IJ SOLN
INTRAMUSCULAR | Status: DC | PRN
  Administered 2017-07-15: 4 mg via INTRAVENOUS

## 2017-07-15 MED ORDER — ROCURONIUM BROMIDE 100 MG/10ML IV SOLN
INTRAVENOUS | Status: DC | PRN
  Administered 2017-07-15: 40 mg via INTRAVENOUS
  Administered 2017-07-15 (×2): 5 mg via INTRAVENOUS

## 2017-07-15 MED ORDER — ACETAMINOPHEN 10 MG/ML IV SOLN
INTRAVENOUS | Status: DC | PRN
  Administered 2017-07-15: 1000 mg via INTRAVENOUS

## 2017-07-15 MED ORDER — FAMOTIDINE 20 MG PO TABS
ORAL_TABLET | ORAL | Status: AC
  Filled 2017-07-15: qty 1

## 2017-07-15 MED ORDER — SUGAMMADEX SODIUM 200 MG/2ML IV SOLN
INTRAVENOUS | Status: DC | PRN
  Administered 2017-07-15: 200 mg via INTRAVENOUS

## 2017-07-15 MED ORDER — BUPIVACAINE-EPINEPHRINE (PF) 0.5% -1:200000 IJ SOLN
INTRAMUSCULAR | Status: AC
  Filled 2017-07-15: qty 30

## 2017-07-15 MED ORDER — FENTANYL CITRATE (PF) 100 MCG/2ML IJ SOLN
INTRAMUSCULAR | Status: DC | PRN
  Administered 2017-07-15 (×2): 25 ug via INTRAVENOUS
  Administered 2017-07-15: 50 ug via INTRAVENOUS
  Administered 2017-07-15: 100 ug via INTRAVENOUS

## 2017-07-15 SURGICAL SUPPLY — 46 items
ADHESIVE MASTISOL STRL (MISCELLANEOUS) ×4 IMPLANT
BLADE CLIPPER SURG (BLADE) ×4 IMPLANT
BLADE SURG SZ11 CARB STEEL (BLADE) ×4 IMPLANT
CANISTER SUCT 1200ML W/VALVE (MISCELLANEOUS) ×4 IMPLANT
CHLORAPREP W/TINT 26ML (MISCELLANEOUS) ×4 IMPLANT
CLOSURE WOUND 1/2 X4 (GAUZE/BANDAGES/DRESSINGS) ×1
DISSECT BALLN SPACEMKR OVL PDB (BALLOONS) ×4
DISSECT BALLN SPACEMKR RND PDB (MISCELLANEOUS) ×4
DISSECTOR BALLN SPCMKR OVL PDB (BALLOONS) ×2 IMPLANT
DISSECTOR BALLN SPCMKR RND PDB (MISCELLANEOUS) ×2 IMPLANT
DRAPE LAPAROTOMY 100X77 ABD (DRAPES) ×4 IMPLANT
ELECT REM PT RETURN 9FT ADLT (ELECTROSURGICAL) ×4
ELECTRODE REM PT RTRN 9FT ADLT (ELECTROSURGICAL) ×2 IMPLANT
GAUZE SPONGE NON-WVN 2X2 STRL (MISCELLANEOUS) ×4 IMPLANT
GLOVE BIO SURGEON STRL SZ8 (GLOVE) ×8 IMPLANT
GOWN STRL REUS W/ TWL LRG LVL3 (GOWN DISPOSABLE) ×4 IMPLANT
GOWN STRL REUS W/TWL LRG LVL3 (GOWN DISPOSABLE) ×4
IRRIGATION STRYKERFLOW (MISCELLANEOUS) IMPLANT
IRRIGATOR STRYKERFLOW (MISCELLANEOUS)
LABEL OR SOLS (LABEL) ×4 IMPLANT
MESH HERNIA 4X6 PROLITE RECT (Mesh General) ×2 IMPLANT
MESH POLY 4X6 (Mesh General) ×2 IMPLANT
NDL SAFETY 22GX1.5 (NEEDLE) ×4 IMPLANT
NS IRRIG 500ML POUR BTL (IV SOLUTION) ×4 IMPLANT
PACK BASIN MINOR ARMC (MISCELLANEOUS) ×4 IMPLANT
PACK LAP CHOLECYSTECTOMY (MISCELLANEOUS) ×4 IMPLANT
SPONGE LAP 18X18 5 PK (GAUZE/BANDAGES/DRESSINGS) ×4 IMPLANT
SPONGE VERSALON 2X2 STRL (MISCELLANEOUS) ×4
STRIP CLOSURE SKIN 1/2X4 (GAUZE/BANDAGES/DRESSINGS) ×3 IMPLANT
SURGILUBE 2OZ TUBE FLIPTOP (MISCELLANEOUS) ×4 IMPLANT
SUT ETHIBOND 0 (SUTURE) ×4 IMPLANT
SUT ETHIBOND NAB CT1 #1 30IN (SUTURE) ×4 IMPLANT
SUT MNCRL 4-0 (SUTURE) ×4
SUT MNCRL 4-0 27XMFL (SUTURE) ×4
SUT PROLENE 0 CT 1 30 (SUTURE) ×8 IMPLANT
SUT PROLENE 1 CT 1 30 (SUTURE) ×8 IMPLANT
SUT VIC AB 3-0 SH 27 (SUTURE) ×2
SUT VIC AB 3-0 SH 27X BRD (SUTURE) ×2 IMPLANT
SUT VICRYL 0 AB UR-6 (SUTURE) ×4 IMPLANT
SUTURE MNCRL 4-0 27XMF (SUTURE) ×4 IMPLANT
SYRINGE 10CC LL (SYRINGE) ×4 IMPLANT
TACKER 5MM HERNIA 3.5CML NAB (ENDOMECHANICALS) ×4 IMPLANT
TRAY FOLEY W/METER SILVER 16FR (SET/KITS/TRAYS/PACK) ×4 IMPLANT
TROCAR 5MM SINGLE VERSAONE (TROCAR) ×8 IMPLANT
TROCAR BALLN 10M OMST10SB SPAC (TROCAR) IMPLANT
TUBING INSUFFLATOR HI FLOW (MISCELLANEOUS) ×4 IMPLANT

## 2017-07-15 NOTE — Discharge Instructions (Signed)
Lap chole Laparoscopic Cholecystectomy, Care After This sheet gives you information about how to care for yourself after your procedure. Your health care provider may also give you more specific instructions. If you have problems or questions, contact your health care provider. What can I expect after the procedure? After the procedure, it is common to have:  Pain at your incision sites. You will be given medicines to control this pain.  Mild nausea or vomiting.  Bloating and possible shoulder pain from the air-like gas that was used during the procedure.  Follow these instructions at home: Incision care   Follow instructions from your health care provider about how to take care of your incisions. Make sure you: ? Wash your hands with soap and water before you change your bandage (dressing). If soap and water are not available, use hand sanitizer. ? Change your dressing as told by your health care provider. ? Leave stitches (sutures), skin glue, or adhesive strips in place. These skin closures may need to be in place for 2 weeks or longer. If adhesive strip edges start to loosen and curl up, you may trim the loose edges. Do not remove adhesive strips completely unless your health care provider tells you to do that.  Do not take baths, swim, or use a hot tub until your health care provider approves. Ask your health care provider if you can take showers. You may only be allowed to take sponge baths for bathing.  Check your incision area every day for signs of infection. Check for: ? More redness, swelling, or pain. ? More fluid or blood. ? Warmth. ? Pus or a bad smell. Activity  Do not drive or use heavy machinery while taking prescription pain medicine.  Do not lift anything that is heavier than 10 lb (4.5 kg) until your health care provider approves.  Do not play contact sports until your health care provider approves.  Do not drive for 24 hours if you were given a medicine to  help you relax (sedative).  Rest as needed. Do not return to work or school until your health care provider approves. General instructions  Take over-the-counter and prescription medicines only as told by your health care provider.  To prevent or treat constipation while you are taking prescription pain medicine, your health care provider may recommend that you: ? Drink enough fluid to keep your urine clear or pale yellow. ? Take over-the-counter or prescription medicines. ? Eat foods that are high in fiber, such as fresh fruits and vegetables, whole grains, and beans. ? Limit foods that are high in fat and processed sugars, such as fried and sweet foods. Contact a health care provider if:  You develop a rash.  You have more redness, swelling, or pain around your incisions.  You have more fluid or blood coming from your incisions.  Your incisions feel warm to the touch.  You have pus or a bad smell coming from your incisions.  You have a fever.  One or more of your incisions breaks open. Get help right away if:  You have trouble breathing.  You have chest pain.  You have increasing pain in your shoulders.  You faint or feel dizzy when you stand.  You have severe pain in your abdomen.  You have nausea or vomiting that lasts for more than one day.  You have leg pain. This information is not intended to replace advice given to you by your health care provider. Make sure you discuss any  questions you have with your health care provider. Document Released: 08/25/2005 Document Revised: 03/15/2016 Document Reviewed: 02/11/2016 Elsevier Interactive Patient Education  2017 [place]   1) The drugs that you were given will stay in your system until tomorrow so for the next 24 hours you should not:  A) Drive an automobile B) Make any legal decisions C) Drink any alcoholic beverage   2) You may resume regular meals tomorrow.   Today it is better to start with liquids and gradually work up to solid foods.  You may eat anything you prefer, but it is better to start with liquids, then soup and crackers, and gradually work up to solid foods.   3) Please notify your doctor immediately if you have any unusual bleeding, trouble breathing, redness and pain at the surgery site, drainage, fever, or pain not relieved by medication.    4) Additional Instructions:        Please contact your physician with any problems or Same Day Surgery at [phone], Monday through Friday 6 am to 4 pm, or [place] at [facility] number at [phone].

## 2017-07-15 NOTE — Anesthesia Preprocedure Evaluation (Signed)
Anesthesia Evaluation  Patient identified by MRN, date of birth, ID band Patient awake    Reviewed: Allergy & Precautions, NPO status , Patient's Chart, lab work & pertinent test results, reviewed documented beta blocker date and time   History of Anesthesia Complications (+) PONV and history of anesthetic complications  Airway Mallampati: III  TM Distance: >3 FB     Dental  (+) Chipped   Pulmonary former smoker,           Cardiovascular hypertension, Pt. on medications      Neuro/Psych    GI/Hepatic GERD  Controlled,  Endo/Other    Renal/GU      Musculoskeletal   Abdominal   Peds  Hematology   Anesthesia Other Findings   Reproductive/Obstetrics                             Anesthesia Physical Anesthesia Plan  ASA: III  Anesthesia Plan: General   Post-op Pain Management:    Induction: Intravenous  PONV Risk Score and Plan: 3  Airway Management Planned: Oral ETT  Additional Equipment:   Intra-op Plan:   Post-operative Plan:   Informed Consent: I have reviewed the patients History and Physical, chart, labs and discussed the procedure including the risks, benefits and alternatives for the proposed anesthesia with the patient or authorized representative who has indicated his/her understanding and acceptance.     Plan Discussed with: CRNA  Anesthesia Plan Comments:         Anesthesia Quick Evaluation

## 2017-07-15 NOTE — Anesthesia Procedure Notes (Signed)
Procedure Name: Intubation Performed by: Lance Muss, CRNA Pre-anesthesia Checklist: Patient identified, Patient being monitored, Timeout performed, Emergency Drugs available and Suction available Patient Re-evaluated:Patient Re-evaluated prior to induction Oxygen Delivery Method: Circle system utilized Preoxygenation: Pre-oxygenation with 100% oxygen Induction Type: IV induction Ventilation: Mask ventilation without difficulty and Oral airway inserted - appropriate to patient size Laryngoscope Size: Mac and 3 Grade View: Grade I Tube type: Oral Tube size: 7.5 mm Number of attempts: 1 Airway Equipment and Method: Stylet Placement Confirmation: ETT inserted through vocal cords under direct vision,  positive ETCO2 and breath sounds checked- equal and bilateral Secured at: 23 cm Tube secured with: Tape Dental Injury: Teeth and Oropharynx as per pre-operative assessment

## 2017-07-15 NOTE — Progress Notes (Signed)
Preoperative Review   Patient is met in the preoperative holding area. The history is reviewed in the chart and with the patient. I personally reviewed the options and rationale as well as the risks of this procedure that have been previously discussed with the patient. All questions asked by the patient and/or family were answered to their satisfaction.  Patient is marked on the right side and umbilical area. Patient agrees to proceed with this procedure at this time.  Florene Glen M.D. FACS

## 2017-07-15 NOTE — Telephone Encounter (Signed)
Medical clearance obtained and approved at this time. Medical clearance scanned and can be found in chart under media tab.

## 2017-07-15 NOTE — Anesthesia Postprocedure Evaluation (Signed)
Anesthesia Post Note  Patient: [place] A Marcello  Procedure(s) Performed: LAPAROSCOPIC INGUINAL HERNIA REPAIR, right with umbilical herniorrhaphy (Right Inguinal) HERNIA REPAIR UMBILICAL ADULT (N/A )  Patient location during evaluation: PACU Anesthesia Type: General Level of consciousness: awake and alert Pain management: pain level controlled Vital Signs Assessment: post-procedure vital signs reviewed and stable Respiratory status: spontaneous breathing, nonlabored ventilation, respiratory function stable and patient connected to nasal cannula oxygen Cardiovascular status: blood pressure returned to baseline and stable Postop Assessment: no apparent nausea or vomiting Anesthetic complications: no     Last Vitals:  Vitals:   07/15/17 1127 07/15/17 1157  BP: (!) 167/73 (!) 147/66  Pulse: 66 64  Resp: 18   Temp: (!) 36.1 C   SpO2: 97% 95%    Last Pain:  Vitals:   07/15/17 1109  PainSc: 0-No pain                 Fannye Myer S

## 2017-07-15 NOTE — Op Note (Signed)
Laparoscopic Inguinal Hernia Repair      Pre-operative Diagnosis: Right inguinal Hernia, umbilical hernia  Post-operative Diagnosis: Right inguinal hernia, umbilical hernia  Procedure: Laparoscopic preperitoneal repair of right inguinal hernia, and the umbilical hernia repair   Surgeon: Jerrol Banana. Burt Knack, MD FACS  Anesthesia: Gen. with endotracheal tube  Assistant: Surgical tech  Procedure Details  The patient was seen again in the Holding Room. The benefits, complications, treatment options, and expected outcomes were discussed with the patient. The risks of bleeding, infection, recurrence of symptoms, failure to resolve symptoms, recurrence of hernia, ischemic orchitis, chronic pain syndrome or neuroma, were discussed again. The likelihood of improving the patient's symptoms with return to their baseline status is good.  The patient and/or family concurred with the proposed plan, giving informed consent.  The patient was taken to Operating Room, identified as and the procedure verified as Laparoscopic right inguinal Hernia Repair and umbilical hernia repair.   A Time Out was held and the above information confirmed.  Prior to the induction of general anesthesia, antibiotic prophylaxis was administered. VTE prophylaxis was in place. General endotracheal anesthesia was then administered and tolerated well. A Foley catheter was placed by the nursing staff. After the induction, the abdomen was prepped with Chloraprep and draped in the sterile fashion. The patient was positioned in the supine position.  Local anesthetic  was injected into the skin near the umbilicus and an incision made. The umbilical hernia was identified and dissected free and the fascial edges were cleaned. Figure-of-eight 0 Ethibonds were utilized to close the umbilical hernia.   An incision was made and dissection down to the right rectus fascia was performed. The fascia was incised and the muscle retracted laterally.  The Covidien dissecting balloon was placed followed by the structural balloon. The preperitoneal space was insufflated and under direct vision 2 midline 5 mm ports were placed.  Dissection was performed to delineate [name]'s ligament and the lateral extent of dissection was determined. The nerve on the lateral abdominal wall was identified and kept in view at all times. The cord was skeletonized of the indirect sac and cord lipoma which was retracted cephalad. On the right was a large indirect sac that was dissected free and the sac was retracted cephalad.  The floor was fairly strong.  Once this was complete, a laterally scissored Atrium mesh was placed into the preperitoneal space.. It was held in place with the Covidien tacking device avoiding the area of the nerve. Once assuring that the hernia was completely repaired and adequately covered, the preperitoneal space was desufflated under direct vision. There was no sign of peritoneal rent and no sign of bowel intrusion towards the mesh.  Once assuring that hemostasis was adequate the ports were removed and a figure-of-eight 0 Vicryl suture was placed at the fascial edges. The skin of the umbilicus was tacked to the fascia with figure-of-eight 30 Vicryls. Deep sutures of 30 Vicryls were placed. 4-0 subcuticular Monocryl was used at all skin edges. Steri-Strips and Mastisol and sterile dressings were placed.  Patient tolerated the procedure well. There were no complications. He was taken to the recovery room in stable condition to be discharged to the care of his family and follow-up in 10 days.    Findings: Large right inguinal hernia and Purpura sub-centimeter umbilical hernia.                       [name] E. Burt Knack, MD, FACS

## 2017-07-15 NOTE — Anesthesia Post-op Follow-up Note (Signed)
Anesthesia QCDR form completed.        

## 2017-07-15 NOTE — Transfer of Care (Signed)
Immediate Anesthesia Transfer of Care Note  Patient: Carlos Hunt  Procedure(s) Performed: LAPAROSCOPIC INGUINAL HERNIA REPAIR, right with umbilical herniorrhaphy (Right Inguinal) HERNIA REPAIR UMBILICAL ADULT (N/A )  Patient Location: PACU  Anesthesia Type:General  Level of Consciousness: awake, alert  and responds to stimulation  Airway & Oxygen Therapy: Patient Spontanous Breathing and Patient connected to face mask oxygen  Post-op Assessment: Report given to RN and Post -op Vital signs reviewed and stable  Post vital signs: Reviewed and stable  Last Vitals:  Vitals:   07/15/17 1039 07/15/17 1043  BP: (!) 159/88 (!) 159/88  Pulse: 75 69  Resp: 16 14  Temp: (!) 36.3 C   SpO2: 98% 98%    Last Pain: There were no vitals filed for this visit.       Complications: No apparent anesthesia complications

## 2017-07-16 ENCOUNTER — Ambulatory Visit: Payer: Medicare Other | Admitting: Neurology

## 2017-07-16 ENCOUNTER — Encounter: Payer: Self-pay | Admitting: Surgery

## 2017-07-17 ENCOUNTER — Telehealth: Payer: Self-pay

## 2017-07-17 NOTE — Progress Notes (Signed)
Carlos Hunt was seen today in the movement disorders clinic for neurologic consultation at the request of [name], MD.  I have reviewed numerous prior records made available to me.  This patient is accompanied in the office by his spouse who supplements the history.  The patient presents today for a second opinion regarding essential tremor.  He has been seeing [name] in [place] since 03/02/2014 and his last appointment with him was on November 05, 2015.  The patient reports that tremor began slowly, in approximately 2007.  Tremor started in both hands. Pt is R hand dominant but both hands shake. The patient notices tremor most when the hand is in use.  There is a family history of tremor in the patient's father, daughter, and brother, but his brother and father do have a known diagnosis of Parkinson's disease.  On his first visit with [name], the patient was started on primidone, 50 mg twice a day for essential tremor.  At his follow-up 2 months later, the patient reported that tremor was better, although he felt that the medication was causing significant dreams.  He continued on the medication until his February, 2017 follow-up and his primidone was increased to 125 mg twice a day.  Jaw tremor was noted that visit.  The patient called the neurology office a few weeks later because of anxiety and just "felt weird" and primidone was reduced to 75 mg twice a day.  04/24/16 update:  The patient was up today, accompanied by his wife who supplements the history.  Records were reviewed and are made available to me.  The patient had a dat scan at [facility] since our last visit.  There was mild asymmetry of update in caudates, less on the left than the right of uncertain etiology and could be from prominent perivascular space or from remote lacute but said not typical for PD or any parkinsonian state.  He had an MRI of the brain in 2016 that was unremarkable.  09/30/16 update:  Patient follows up  today, accompanied by his wife who supplements the history.  The patient had fiducials placed today in anticipation of DBS surgery for essential tremor.  Surgery is scheduled for one week from today, 10/07/2016.  The patient reports that he is ready for surgery.  He is currently on primidone, 50 mg bid.  Doesn't think that it is helping.  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    11/06/16 update:  Pt f/u today.  He underwent bilateral VIM DBS on 10/07/16.  He had IPG placed on 10/23/16 and did stay one night overnight.  Pt reports that he has recovered well.  No falls since our last visit.  He is no longer on the primidone.  11/27/16 update:  Patient follows up today.  His device was activated about 3 weeks ago.  The patient reports that he has done well.  He denies side effects with the device.  In regards to tremor, he states that he has minor tremor in the dominant hand.  No falls.  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  Something which was taking him 30 minutes is now taking him under 5 minutes.  He asks me to look at a place on the scalp.  03/10/17 update:  Pt seen today with wife who supplements the history.  He called 02/25/17 and said that one week prior he started having tremor and having speech issues.  Pt thinks that the onset was slow but wife thinks that it was acute.  Pt states that it was subacute but didn't tell anyone.  He is having lots of speech trouble, trouble thinking and headaches at the R occiput.  He has trouble describing the headache even when given choices of descriptors.  "I don't think that I am numb but I might be."  Unsure where.  Admits to drooling that is new.  No troubles swallowing.  Feels that he is dragging the right leg.  Tremor on the right hand is a bit worse.  He thought that it was related to DBS but told by our office to have evaluated but has not yet done so.    03/31/17 update:  Pt seen in f/u.  Much has happened.  The records  that were made available to me were reviewed.  He had his L DBS system removed on 03/10/17 due to cerebral abscess. It ultimately grew out propionibacterium.  He is seeing [name].  He has an appt with him on 04/14/17.  He saw [name] last week and while I don't have f/u notes, I did speak to [name] about him.  He felt that he was doing better and wanted to hold off on repeating CT brain until abx were completed.  Pt reports that he feels great.  Speech is back to normal.  Biggest issue is re-emergence of tremor in the right hand.  Asks me about when DBS can be placed back.    07/20/17 update: Patient seen in follow-up for his essential tremor.  I have reviewed his records since our last visit.  He is off of all antibiotics.  He last saw [name] on June 04, 2017.  He had right inguinal and umbilical hernia repair surgery since last visit, completed on July 15, 2017.  Pt feels great and is on no pain medication.  He would like to proceed with placement of DBS again.  He is having difficulty eating because of tremor on the right.  He has significant difficulty eating.  Neuroimaging has previously been performed.  It is available for my review today.  MRI was done 10/02/14 and was unremarkable.    ALLERGIES:   Allergies  Allergen Reactions  . Procaine Nausea And Vomiting    NOVOCAINE  . Sulfacetamide Sodium-Sulfur Rash    CURRENT MEDICATIONS:  Outpatient Encounter Medications as of 07/20/2017  Medication Sig  . acetaminophen (TYLENOL) 325 MG tablet Take 650 mg every 6 (six) hours as needed by mouth.  Marland Kitchen aspirin 325 MG tablet Take 325 mg by mouth daily.  . Cholecalciferol (VITAMIN D3) 2000 units TABS Take 2,000 Units by mouth daily.  . Cyanocobalamin (B-12) 3000 MCG CAPS Take 3,000 mcg by mouth daily.  . Digestive Enzymes (PAPAYA ENZYME) TABS Take 2 tablets by mouth daily as needed (heartburn).  . Echinacea 400 MG CAPS Take 400 mg by mouth daily.  . fluticasone (FLONASE) 50  MCG/ACT nasal spray Place 1 spray into both nostrils daily as needed for allergies or rhinitis.  Marland Kitchen GLUCOSAMINE-CHONDROIT-MSM-C-MN PO Take 1 tablet by mouth daily.   . hydrochlorothiazide (HYDRODIURIL) 25 MG tablet Take 1 tablet (25 mg total) by mouth daily.  . Hydrocodone-Acetaminophen (VICODIN) 5-300 MG TABS Take 1 tablet every 4 (four) hours as needed by mouth.  . losartan (COZAAR) 50 MG tablet Take 1 tablet (50 mg total) by mouth daily.  . Multiple Vitamins-Minerals (CENTRUM PO) Take 1 tablet by mouth daily.   Marland Kitchen  Omega-3 Fatty Acids (ULTRA OMEGA 3 PO) Take 2 capsules by mouth daily.   . Tetrahydroz-Glyc-Hyprom-PEG (VISINE MAXIMUM REDNESS RELIEF OP) Place 1 drop into both eyes daily as needed (for irritation).    No facility-administered encounter medications on file as of 07/20/2017.     PAST MEDICAL HISTORY:   Past Medical History:  Diagnosis Date  . Adenomatous polyps 04/02/2017  . Brain abscess 03/11/2017  . Erectile dysfunction   . Essential hypertension, benign 02/14/2013  . Essential tremor   . GERD (gastroesophageal reflux disease)   . Hx of colonic polyps   . Hypertension   . Pain in left knee   . Pain, joint, shoulder   . PONV (postoperative nausea and vomiting)    nausea no vomiting  . Seasonal allergies   . Testicular hypofunction   . Tobacco use disorder   . Tremor   . Unspecified vitamin D deficiency     PAST SURGICAL HISTORY:   Past Surgical History:  Procedure Laterality Date  . COLONOSCOPY  09/09/2011   normal.  Previous polyps.  [name]. Repeat 5 years.  Marland Kitchen HAND SURGERY Left 2011   trauma    SOCIAL HISTORY:   Social History   Socioeconomic History  . Marital status: Married    Spouse name: Not on file  . Number of children: 4  . Years of education: Not on file  . Highest education level: Not on file  Social Needs  . Financial resource strain: Not on file  . Food insecurity - worry: Not on file  . Food insecurity - inability: Not on file  .  Transportation needs - medical: Not on file  . Transportation needs - non-medical: Not on file  Occupational History  . Occupation: building maintenance    Comment: [employer]  Tobacco Use  . Smoking status: Former Smoker    Packs/day: 3.00    Years: 10.00    Pack years: 30.00    Types: Cigarettes    Last attempt to quit: 09/23/1970    Years since quitting: 46.8  . Smokeless tobacco: Never Used  . Tobacco comment: quit in the 1970's  Substance and Sexual Activity  . Alcohol use: Yes    Alcohol/week: 0.6 oz    Types: 1 Shots of liquor per week    Comment: occasional; once every 6 months  . Drug use: No  . Sexual activity: Yes  Other Topics Concern  . Not on file  Social History Narrative   Marital status: married x 45 years.      Children: 2 daughters, 2 sons; 6 grandchildren.      Lives: with wife.        Employment:  [employer] x 13 years; moderately happy.  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      Tobacco: quit 42 years ago; smoked x 10 years.       Alcohol: socially; weekends.  Rarely.      Drugs: none      Exercise:  None; work is physical.        Seatbelt: 100%      Sunscreen: rare sun exposure      Guns:  4 gun; unloaded, not stored in locked cabinet.       Smoke alarm and carbon monoxide detector in the home.      Caffeine use: consumes a minimal amount   Living Will: patient does NOT have Living Will; Does NOT have HCPOA; getting ready to work on it   [employer]  Donor: NO          FAMILY HISTORY:   Family Status  Relation Name Status  . Mother  Deceased at age 21       dementia  . Father  Deceased at age 86       PD, heart disease  . Sister  Alive       unknown  . Brother  Deceased at age 9       Parkinson's disease  . Sister  Alive       unknown  . Brother  Alive       unknown  . Brother  Alive       unknown  . Daughter  Alive       tremor  . Son  Alive       healthy    ROS:  A complete 10 system review of systems was obtained and  was unremarkable apart from what is mentioned above.  PHYSICAL EXAMINATION:    VITALS:   Vitals:   07/20/17 1437  BP: (!) 144/76  Pulse: 70  SpO2: 97%  Weight: 213 lb (96.6 kg)  Height: 5' 11"  (1.803 m)    GEN:  The patient appears stated age and is in NAD.  Pt diaphoretic HEENT:  Normocephalic, atraumatic.  The mucous membranes are moist. The superficial temporal arteries are without ropiness or tenderness. CV:  RRR Lungs:  CTAB Neck/HEME:  There are no carotid bruits bilaterally. Skin:  Battery sites and scalp incisions are well healed.    Neurological examination:  Orientation: The patient is alert and oriented x3.  Cranial nerves: There is good facial symmetry.  No facial asymmetry.  The visual fields are full to confrontational testing. The speech is fluent again and clear.   Soft palate rises symmetrically and there is no tongue deviation. Hearing is intact to conversational tone. Sensation: Sensation is intact to light and pinprick throughout (facial, trunk, extremities).  There is no extinction with double simultaneous stimulation. There is no sensory dermatomal level identified. Motor: Strength is 5/5 in the bilateral upper and lower extremities.   Shoulder shrug is equal and symmetric.  There is no pronator drift.   Movement examination: Tone: There is normal tone in the bilateral upper extremities.  The tone in the lower extremities is normal.  Abnormal movements:  He has tremor on the R as he approaches an object.  It is severe as he maintains the posture.  There is no tremor on the left. Coordination:  There is good RAMs. Gait and Station: The patient has no difficulty arising out of a deep-seated chair without the use of the hands. The patient's stride length is normal.  The patient has a negative pull test.    DBS programming is performed today.  He did not alter the DBS.  ASSESSMENT/PLAN:  1.  Essential Tremor  -The patient had a dat scan at [facility] since our  last visit.  There was mild asymmetry of uptake in caudates, less on the left than the right of uncertain etiology and could be from prominent perivascular space or from remote lacute but said not typical for PD or any parkinsonian state.  He had an MRI of the brain in 2016 that was unremarkable.  -Patient underwent bilateral VIM DBS on 10/07/2016.  He had bilateral IPG placement on 10/23/2016.  Patient's DBS device was activated, 11/07/2016.  Unfortunately, he had a cerebral absess on the left and the L system was removed on 03/10/17.  He is currently on Abx therapy and seeing ID.  -will repeat CT brain  -pt is interested in DBS replacement on the L.  It has only been out for about 4 months though and I would like to wait a few more before considering more surgery, especially since he just had surgery last week for hernia repair.  I told him I will likely do another pre-op MRI although will need to be 1.5 T prior to replacement of the DBS.  Talked extensively about risks, benefits, and side effects.  Talked about risk of infection, which he states that he fully understands.  Would like to wait until February or March of next year and he was agreeable.  2. Much greater than 50% of this visit was spent in counseling and coordinating care.  Total face to face time:  25 min

## 2017-07-17 NOTE — Telephone Encounter (Signed)
Medical clearance obtained and approved at this time. Clearance can be found under Media Tab.

## 2017-07-20 ENCOUNTER — Encounter: Payer: Self-pay | Admitting: Neurology

## 2017-07-20 ENCOUNTER — Ambulatory Visit (INDEPENDENT_AMBULATORY_CARE_PROVIDER_SITE_OTHER): Payer: Medicare Other | Admitting: Neurology

## 2017-07-20 VITALS — BP 144/76 | HR 70 | Ht 71.0 in | Wt 213.0 lb

## 2017-07-20 DIAGNOSIS — I63312 Cerebral infarction due to thrombosis of left middle cerebral artery: Secondary | ICD-10-CM

## 2017-07-20 DIAGNOSIS — G250 Essential tremor: Secondary | ICD-10-CM

## 2017-07-20 DIAGNOSIS — G060 Intracranial abscess and granuloma: Secondary | ICD-10-CM

## 2017-07-20 NOTE — Patient Instructions (Signed)
1. We have scheduled you at [facility] for your scan on 07/21/17 at 4:15 pm. Please arrive 15 minutes prior and go to [place]. If you need to reschedule for any reason please call [phone].

## 2017-07-20 NOTE — Procedures (Signed)
DBS Programming was performed.    Total time spent programming was 10 minutes.  Device was confirmed to be on.  Soft start was confirmed to be on.  Impedences were checked and were within normal limits.  Battery was checked and was determined to be functioning normally and not near the end of life (2.95).  Final settings were as follows:  Left brain electrode:     n/a  Right brain electrode:     2-1+          ; Amplitude   2.7  V ;  Pulse width 90  microseconds;  Frequency   150    Hz.

## 2017-07-21 ENCOUNTER — Ambulatory Visit (INDEPENDENT_AMBULATORY_CARE_PROVIDER_SITE_OTHER)
Admission: RE | Admit: 2017-07-21 | Discharge: 2017-07-21 | Disposition: A | Discharge status: Home / Self Care | Payer: Medicare Other | Source: Ambulatory Visit | Attending: Neurology | Admitting: Neurology

## 2017-07-21 DIAGNOSIS — G060 Intracranial abscess and granuloma: Secondary | ICD-10-CM

## 2017-07-21 DIAGNOSIS — R51 Headache: Secondary | ICD-10-CM | POA: Diagnosis not present

## 2017-07-21 DIAGNOSIS — G250 Essential tremor: Secondary | ICD-10-CM | POA: Diagnosis not present

## 2017-07-22 ENCOUNTER — Telehealth: Payer: Self-pay | Admitting: Neurology

## 2017-07-22 ENCOUNTER — Ambulatory Visit (INDEPENDENT_AMBULATORY_CARE_PROVIDER_SITE_OTHER): Payer: Medicare Other | Admitting: Surgery

## 2017-07-22 ENCOUNTER — Encounter: Payer: Self-pay | Admitting: Surgery

## 2017-07-22 VITALS — BP 168/82 | HR 77 | Temp 97.5°F | Ht 71.0 in | Wt 212.8 lb

## 2017-07-22 DIAGNOSIS — K4090 Unilateral inguinal hernia, without obstruction or gangrene, not specified as recurrent: Secondary | ICD-10-CM

## 2017-07-22 NOTE — Progress Notes (Signed)
Outpatient postop visit  07/22/2017  Carlos Hunt is an 71 y.o. male.    Procedure: Lap right inguinal hernia and umbilical hernia repair  CC: Wants to go back to work  HPI: This patient status post laparoscopic preperitoneal repair of right inguinal hernia and umbilical herniorrhaphy at the same time. He will as asking when he can go back to work and is not having any problems. No pain only took 5 pain pills.  Medications reviewed.    Physical Exam:  BP (!) 168/82   Pulse 77   Temp (!) 97.5 F (36.4 C) (Oral)   Ht 5' 11"  (1.803 m)   Wt 212 lb 12.8 oz (96.5 kg)   BMI 29.68 kg/m     PE: Moderate resolving ecchymosis no erythema no drainage soft and nontender all wounds clean    Assessment/Plan:  Patient doing very well controlled back to work at any time without restriction. Follow up on an as-needed basis  Florene Glen, MD, FACS

## 2017-07-22 NOTE — Telephone Encounter (Signed)
Patient made aware of results.  

## 2017-07-22 NOTE — Patient Instructions (Signed)

## 2017-07-22 NOTE — Telephone Encounter (Signed)
-----   Message from [place], DO sent at 07/21/2017  4:30 PM EST ----- Let pt know that CT looks ok

## 2017-07-23 ENCOUNTER — Other Ambulatory Visit (HOSPITAL_COMMUNITY): Payer: Self-pay | Admitting: Neurosurgery

## 2017-07-23 DIAGNOSIS — G250 Essential tremor: Secondary | ICD-10-CM

## 2017-07-24 ENCOUNTER — Other Ambulatory Visit: Payer: Self-pay | Admitting: Neurosurgery

## 2017-09-24 ENCOUNTER — Encounter: Payer: Self-pay | Admitting: Physician Assistant

## 2017-09-24 ENCOUNTER — Other Ambulatory Visit: Payer: Self-pay

## 2017-09-24 ENCOUNTER — Ambulatory Visit (INDEPENDENT_AMBULATORY_CARE_PROVIDER_SITE_OTHER): Payer: Medicare Other | Admitting: Physician Assistant

## 2017-09-24 VITALS — BP 136/64 | HR 74 | Temp 97.7°F | Resp 18 | Ht 70.08 in | Wt 212.0 lb

## 2017-09-24 DIAGNOSIS — R52 Pain, unspecified: Secondary | ICD-10-CM | POA: Diagnosis not present

## 2017-09-24 DIAGNOSIS — R05 Cough: Secondary | ICD-10-CM | POA: Diagnosis not present

## 2017-09-24 DIAGNOSIS — J0100 Acute maxillary sinusitis, unspecified: Secondary | ICD-10-CM | POA: Diagnosis not present

## 2017-09-24 DIAGNOSIS — J3489 Other specified disorders of nose and nasal sinuses: Secondary | ICD-10-CM | POA: Diagnosis not present

## 2017-09-24 DIAGNOSIS — H6123 Impacted cerumen, bilateral: Secondary | ICD-10-CM | POA: Diagnosis not present

## 2017-09-24 DIAGNOSIS — R059 Cough, unspecified: Secondary | ICD-10-CM

## 2017-09-24 LAB — POCT INFLUENZA A/B
Influenza A, POC: NEGATIVE
Influenza B, POC: NEGATIVE

## 2017-09-24 MED ORDER — AZELASTINE HCL 0.1 % NA SOLN: 2.0000 | Freq: Two times a day (BID) | NASAL | 0 refills | Status: DC

## 2017-09-24 MED ORDER — DOXYCYCLINE HYCLATE 100 MG PO CAPS: 100.0000 mg | ORAL_CAPSULE | Freq: Two times a day (BID) | ORAL | 0 refills | Status: DC

## 2017-09-24 NOTE — Progress Notes (Signed)
cerum    MRN: [id number] DOB: [birth year]  Subjective:   Carlos Hunt is a 72 y.o. male presenting for chief complaint of Cough (X 4 days) and Generalized Body Aches (X 3 days) .  Reports 3 weeks ago had a head cold but that all seemed to get better and then a cough settled in his chest 4 days ago and he developed flu like symptoms. Now has a dry cough, worsening sinus pressure, subjective fever, generalized body aches, mild headache, and rhinorrhea. Has tried mucinex, nyquil, cough spray with some relief.  Denies ear pain, sore throat, wheezing, shortness of breath, chest pain, nausea, vomiting, abdominal pain and diarrhea. Has had sick contact with grandkids. Has history of seasonal allergies, no history of asthma. Patient has had flu shot this season. Denies current smoking.  Denies any other aggravating or relieving factors, no other questions or concerns.  [place] has a current medication list which includes the following prescription(s): aspirin, vitamin d3, b-12, papaya enzyme, echinacea, fluticasone, glucosamine-chondroit-msm-c-mn, hydrochlorothiazide, losartan, multiple vitamins-minerals, omega-3 fatty acids, tetrahydroz-glyc-hyprom-peg, azelastine, and doxycycline. Also is allergic to procaine and sulfacetamide sodium-sulfur.  [name]  has a past medical history of Adenomatous polyps (04/02/2017), Brain abscess (03/11/2017), Erectile dysfunction, Essential hypertension, benign (02/14/2013), Essential tremor, GERD (gastroesophageal reflux disease), colonic polyps, Hypertension, Pain in left knee, Pain, joint, shoulder, PONV (postoperative nausea and vomiting), Seasonal allergies, Testicular hypofunction, Tobacco use disorder, Tremor, and Unspecified vitamin D deficiency. Also  has a past surgical history that includes Colonoscopy (09/09/2011); Hand surgery (Left, 2011); Colonoscopy (N/A, 07/18/2016); Subthalamic stimulator insertion (Bilateral, 10/07/2016); Pulse generator implant (Bilateral, 10/23/2016);  Subthalamic stimulator removal (Left, 03/10/2017); Laparoscopic inguinal hernia with umbilical hernia (Right, 76/10/8313); and Umbilical hernia repair (N/A, 07/15/2017).   Objective:   Vitals: BP 136/64 (BP Location: Left Arm, Patient Position: Sitting, Cuff Size: Normal)   Pulse 74   Temp 97.7 F (36.5 C) (Oral)   Resp 18   Ht 5' 10.08" (1.78 m)   Wt 212 lb (96.2 kg)   SpO2 97%   BMI 30.35 kg/m   Physical Exam  Constitutional: He is oriented to person, place, and time. He appears well-developed and well-nourished.  HENT:  Head: Normocephalic and atraumatic.  Right Ear: There is drainage (dark brown cerumen blocking view of TM).  Left Ear: There is drainage (dark brown cerumen blocking view of TM).  Nose: Mucosal edema (mild) present. No rhinorrhea. Right sinus exhibits maxillary sinus tenderness. Right sinus exhibits no frontal sinus tenderness. Left sinus exhibits maxillary sinus tenderness. Left sinus exhibits no frontal sinus tenderness.  Mouth/Throat: Uvula is midline and mucous membranes are normal. Posterior oropharyngeal erythema present. No tonsillar exudate.  Eyes: Conjunctivae are normal.  Neck: Normal range of motion.  Cardiovascular: Normal rate, regular rhythm and normal heart sounds.  Pulmonary/Chest: Effort normal and breath sounds normal. He has no wheezes. He has no rhonchi. He has no rales.  Lymphadenopathy:       Head (right side): No submental, no submandibular, no tonsillar, no preauricular, no posterior auricular and no occipital adenopathy present.       Head (left side): No submental, no submandibular, no tonsillar, no preauricular, no posterior auricular and no occipital adenopathy present.    He has no cervical adenopathy.       Right: No supraclavicular adenopathy present.       Left: No supraclavicular adenopathy present.  Neurological: He is alert and oriented to person, place, and time.  Skin: Skin is warm and dry.  Psychiatric: He  has a normal mood and  affect.  Vitals reviewed.  Results for orders placed or performed in visit on 09/24/17 (from the past 24 hour(s))  POCT Influenza A/B     Status: None   Collection Time: 09/24/17  2:39 PM  Result Value Ref Range   Influenza A, POC Negative Negative   Influenza B, POC Negative Negative   After ear lavage, bilateral TMs visible, no erythema, bugling, or perforation noted.   Assessment and Plan :  1. Generalized body aches - POCT Influenza A/B 2. Bilateral impacted cerumen Resolved with ear lavage.  - Ear wax removal 3. Cough 4. Rhinorrhea - azelastine (ASTELIN) 0.1 % nasal spray; Place 2 sprays into both nostrils 2 (two) times daily. Use in each nostril as directed  Dispense: 30 mL; Refill: 0 5. Acute non-recurrent maxillary sinusitis Vitals stable, he is afebrile. Pt is tender to palpation of maxillary sinuses. Lungs are CTAB. Due to duration of symptoms with no improvement with conservative tx, will treat with abx at this time to cover for any underlying bacterial etiology. Recommended also continuing sx relief.  Advised to return to clinic if symptoms worsen, do not improve, or as needed. - doxycycline (VIBRAMYCIN) 100 MG capsule; Take 1 capsule (100 mg total) by mouth 2 (two) times daily.  Dispense: 20 capsule; Refill: 0  Tenna Delaine, PA-C  [facility] 09/24/2017 6:25 PM

## 2017-09-24 NOTE — Patient Instructions (Addendum)
Your symptoms are consistent with sinusitis. Due the duration of your symptoms, we can treat you for underlying bacterial etiology at this time. I also recommend using a nasal spray, ibuprofen or tylenol for pain, and staying hydrated with lots of fluids and light meals. You can use over the counter delsym cough syrup to help with cough at night. Please return to clinic if symptoms worsen, do not improve, or as needed.  Sinusitis, Adult Sinusitis is soreness and inflammation of your sinuses. Sinuses are hollow spaces in the bones around your face. They are located:  Around your eyes.  In the middle of your forehead.  Behind your nose.  In your cheekbones.  Your sinuses and nasal passages are lined with a stringy fluid (mucus). Mucus normally drains out of your sinuses. When your nasal tissues get inflamed or swollen, the mucus can get trapped or blocked so air cannot flow through your sinuses. This lets bacteria, viruses, and funguses grow, and that leads to infection. Follow these instructions at home: Medicines  Take, use, or apply over-the-counter and prescription medicines only as told by your doctor. These may include nasal sprays.  If you were prescribed an antibiotic medicine, take it as told by your doctor. Do not stop taking the antibiotic even if you start to feel better. Hydrate and Humidify  Drink enough water to keep your pee (urine) clear or pale yellow.  Use a cool mist humidifier to keep the humidity level in your home above 50%.  Breathe in steam for 10-15 minutes, 3-4 times a day or as told by your doctor. You can do this in the bathroom while a hot shower is running.  Try not to spend time in cool or dry air. Rest  Rest as much as possible.  Sleep with your head raised (elevated).  Make sure to get enough sleep each night. General instructions  Put a warm, moist washcloth on your face 3-4 times a day or as told by your doctor. This will help with  discomfort.  Wash your hands often with soap and water. If there is no soap and water, use hand sanitizer.  Do not smoke. Avoid being around people who are smoking (secondhand smoke).  Keep all follow-up visits as told by your doctor. This is important. Contact a doctor if:  You have a fever.  Your symptoms get worse.  Your symptoms do not get better within 10 days. Get help right away if:  You have a very bad headache.  You cannot stop throwing up (vomiting).  You have pain or swelling around your face or eyes.  You have trouble seeing.  You feel confused.  Your neck is stiff.  You have trouble breathing. This information is not intended to replace advice given to you by your health care provider. Make sure you discuss any questions you have with your health care provider. Document Released: 02/11/2008 Document Revised: 04/20/2016 Document Reviewed: 06/20/2015 Elsevier Interactive Patient Education  2018 [employer].   IF you received an x-ray today, you will receive an invoice from [facility]. Please contact [facility] at [phone] with questions or concerns regarding your invoice.   IF you received labwork today, you will receive an invoice from [facility]. Please contact [facility] at [phone] with questions or concerns regarding your invoice.   Our billing staff will not be able to assist you with questions regarding bills from these companies.  You will be contacted with the lab results as soon as they are available.  The fastest way to get your results is to activate your My Chart account. Instructions are located on the last page of this paperwork. If you have not heard from [place] regarding the results in 2 weeks, please contact this office.

## 2017-11-13 NOTE — Progress Notes (Signed)
Carlos Hunt was seen today in the movement disorders clinic for neurologic consultation at the request of [name], MD.  I have reviewed numerous prior records made available to me.  This patient is accompanied in the office by his spouse who supplements the history.  The patient presents today for a second opinion regarding essential tremor.  He has been seeing [name] in [place] since 03/02/2014 and his last appointment with him was on November 05, 2015.  The patient reports that tremor began slowly, in approximately 2007.  Tremor started in both hands. Pt is R hand dominant but both hands shake. The patient notices tremor most when the hand is in use.  There is a family history of tremor in the patient's father, daughter, and brother, but his brother and father do have a known diagnosis of Parkinson's disease.  On his first visit with [name], the patient was started on primidone, 50 mg twice a day for essential tremor.  At his follow-up 2 months later, the patient reported that tremor was better, although he felt that the medication was causing significant dreams.  He continued on the medication until his February, 2017 follow-up and his primidone was increased to 125 mg twice a day.  Jaw tremor was noted that visit.  The patient called the neurology office a few weeks later because of anxiety and just "felt weird" and primidone was reduced to 75 mg twice a day.  04/24/16 update:  The patient was up today, accompanied by his wife who supplements the history.  Records were reviewed and are made available to me.  The patient had a dat scan [facility] since our last visit.  There was mild asymmetry of update in caudates, less on the left than the right of uncertain etiology and could be from prominent perivascular space or from remote lacute but said not typical for PD or any parkinsonian state.  He had an MRI of the brain in 2016 that was unremarkable.  09/30/16 update:  Patient follows up  today, accompanied by his wife who supplements the history.  The patient had fiducials placed today in anticipation of DBS surgery for essential tremor.  Surgery is scheduled for one week from today, 10/07/2016.  The patient reports that he is ready for surgery.  He is currently on primidone, 50 mg bid.  Doesn't think that it is helping.  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    11/06/16 update:  Pt f/u today.  He underwent bilateral VIM DBS on 10/07/16.  He had IPG placed on 10/23/16 and did stay one night overnight.  Pt reports that he has recovered well.  No falls since our last visit.  He is no longer on the primidone.  11/27/16 update:  Patient follows up today.  His device was activated about 3 weeks ago.  The patient reports that he has done well.  He denies side effects with the device.  In regards to tremor, he states that he has minor tremor in the dominant hand.  No falls.  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  Something which was taking him 30 minutes is now taking him under 5 minutes.  He asks me to look at a place on the scalp.  03/10/17 update:  Pt seen today with wife who supplements the history.  He called 02/25/17 and said that one week prior he started having tremor and having speech issues.  Pt thinks that the onset was slow but wife thinks that it was acute.  Pt states that it was subacute but didn't tell anyone.  He is having lots of speech trouble, trouble thinking and headaches at the R occiput.  He has trouble describing the headache even when given choices of descriptors.  "I don't think that I am numb but I might be."  Unsure where.  Admits to drooling that is new.  No troubles swallowing.  Feels that he is dragging the right leg.  Tremor on the right hand is a bit worse.  He thought that it was related to DBS but told by our office to have evaluated but has not yet done so.    03/31/17 update:  Pt seen in f/u.  Much has happened.  The records  that were made available to me were reviewed.  He had his L DBS system removed on 03/10/17 due to cerebral abscess. It ultimately grew out propionibacterium.  He is seeing [name].  He has an appt with him on 04/14/17.  He saw [name] last week and while I don't have f/u notes, I did speak to [name] about him.  He felt that he was doing better and wanted to hold off on repeating CT brain until abx were completed.  Pt reports that he feels great.  Speech is back to normal.  Biggest issue is re-emergence of tremor in the right hand.  Asks me about when DBS can be placed back.    07/20/17 update: Patient seen in follow-up for his essential tremor.  I have reviewed his records since our last visit.  He is off of all antibiotics.  He last saw [name] on June 04, 2017.  He had right inguinal and umbilical hernia repair surgery since last visit, completed on July 15, 2017.  Pt feels great and is on no pain medication.  He would like to proceed with placement of DBS again.  He is having difficulty eating because of tremor on the right.  He has significant difficulty eating.  11/17/17 update: Patient is seen today in follow-up for his essential tremor.  He is accompanied by his wife who supplements the history.  He reports that he is doing well, with the exception of having tremor in the right hand because of the fact that the device was removed on the left side due to infection.  He is having difficulty eating.  He very much expresses his desire to have his lead replaced.  He has just changed jobs and is working 2nd shift, full time.    Neuroimaging has previously been performed.  It is available for my review today.  MRI was done 10/02/14 and was unremarkable.    ALLERGIES:   Allergies  Allergen Reactions  . Procaine Nausea And Vomiting    NOVOCAINE  . Sulfacetamide Sodium-Sulfur Rash    CURRENT MEDICATIONS:  Outpatient Encounter Medications as of 11/17/2017  Medication Sig  . Ascorbic  Acid (VITAMIN C) 1000 MG tablet Take 1,000 mg by mouth daily.  Marland Kitchen aspirin 325 MG tablet Take 325 mg by mouth daily.  . Cholecalciferol (VITAMIN D3) 2000 units TABS Take 2,000 Units by mouth daily.  . Cyanocobalamin (B-12) 3000 MCG CAPS Take 3,000 mcg by mouth daily.  . Digestive Enzymes (PAPAYA AND ENZYMES PO) Take 1 tablet by mouth daily as needed (heartburn).  Marland Kitchen ECHINACEA PO Take 334 mg by mouth daily.  . fluticasone (FLONASE) 50 MCG/ACT nasal spray Place  1 spray into both nostrils daily as needed for allergies or rhinitis.  . hydrochlorothiazide (HYDRODIURIL) 25 MG tablet Take 1 tablet (25 mg total) by mouth daily.  Marland Kitchen losartan (COZAAR) 50 MG tablet Take 1 tablet (50 mg total) by mouth daily.  . Methylsulfonylmethane (MSM) 1000 MG CAPS Take 1,000 mg by mouth daily.  . Multiple Vitamins-Minerals (CENTRUM PO) Take 1 tablet by mouth daily.   . Tetrahydroz-Glyc-Hyprom-PEG (VISINE MAXIMUM REDNESS RELIEF OP) Place 1 drop into both eyes daily as needed (for irritation).   . trolamine salicylate (ASPERCREME) 10 % cream Apply 1 application topically as needed (shoulder pain).  . [DISCONTINUED] azelastine (ASTELIN) 0.1 % nasal spray Place 2 sprays into both nostrils 2 (two) times daily. Use in each nostril as directed (Patient not taking: Reported on 11/16/2017)  . [DISCONTINUED] Digestive Enzymes (PAPAYA ENZYME) TABS Take 2 tablets by mouth daily as needed (heartburn).  . [DISCONTINUED] doxycycline (VIBRAMYCIN) 100 MG capsule Take 1 capsule (100 mg total) by mouth 2 (two) times daily. (Patient not taking: Reported on 11/16/2017)  . [DISCONTINUED] Echinacea 400 MG CAPS Take 400 mg by mouth daily.  . [DISCONTINUED] GLUCOSAMINE-CHONDROIT-MSM-C-MN PO Take 1 tablet by mouth daily.   . [DISCONTINUED] Omega-3 Fatty Acids (ULTRA OMEGA 3 PO) Take 2 capsules by mouth daily.    No facility-administered encounter medications on file as of 11/17/2017.     PAST MEDICAL HISTORY:   Past Medical History:  Diagnosis  Date  . Adenomatous polyps 04/02/2017  . Brain abscess 03/11/2017  . Erectile dysfunction   . Essential hypertension, benign 02/14/2013  . Essential tremor   . GERD (gastroesophageal reflux disease)   . Hx of colonic polyps   . Hypertension   . Pain in left knee   . Pain, joint, shoulder   . PONV (postoperative nausea and vomiting)    nausea no vomiting  . Seasonal allergies   . Testicular hypofunction   . Tobacco use disorder   . Tremor   . Unspecified vitamin D deficiency     PAST SURGICAL HISTORY:   Past Surgical History:  Procedure Laterality Date  . COLONOSCOPY  09/09/2011   normal.  Previous polyps.  [name]. Repeat 5 years.  . COLONOSCOPY N/A 07/18/2016   Procedure: COLONOSCOPY;  Surgeon: Manya Silvas, MD;  Location: [facility] ENDOSCOPY;  Service: Endoscopy;  Laterality: N/A;  . HAND SURGERY Left 2011   trauma  . LAPAROSCOPIC INGUINAL HERNIA WITH UMBILICAL HERNIA Right 16/09/958   Procedure: LAPAROSCOPIC INGUINAL HERNIA REPAIR, right with umbilical herniorrhaphy;  Surgeon: Florene Glen, MD;  Location: [facility] ORS;  Service: General;  Laterality: Right;  . PULSE GENERATOR IMPLANT Bilateral 10/23/2016   Procedure: BILATERAL IMPLANTABLE PULSE GENERATOR;  Surgeon: Erline Levine, MD;  Location: [place];  Service: Neurosurgery;  Laterality: Bilateral;  BILATERAL IMPLANTABLE PULSE GENERATOR  . SUBTHALAMIC STIMULATOR INSERTION Bilateral 10/07/2016   Procedure: BILATERAL DEEP BRAIN STIMULATOR PLACEMENT;  Surgeon: Erline Levine, MD;  Location: [place];  Service: Neurosurgery;  Laterality: Bilateral;  BILATERAL DEEP BRAIN STIMULATOR PLACEMENT  . SUBTHALAMIC STIMULATOR REMOVAL Left 03/10/2017   Procedure: REMOVAL DEEP BRAIN STIMULATOR AND ASPIRATION OF BRAIN ABSCESS;  Surgeon: Earnie Larsson, MD;  Location: [place];  Service: Neurosurgery;  Laterality: Left;  . UMBILICAL HERNIA REPAIR N/A 07/15/2017   Procedure: HERNIA REPAIR UMBILICAL ADULT;  Surgeon: Florene Glen, MD;  Location: [facility] ORS;  Service:  General;  Laterality: N/A;    SOCIAL HISTORY:   Social History   Socioeconomic History  . Marital status:  Married    Spouse name: [name]  . Number of children: 4  . Years of education: Not on file  . Highest education level: Not on file  Social Needs  . Financial resource strain: Not on file  . Food insecurity - worry: Not on file  . Food insecurity - inability: Not on file  . Transportation needs - medical: Not on file  . Transportation needs - non-medical: Not on file  Occupational History  . Occupation: building maintenance    Comment: [employer]  Tobacco Use  . Smoking status: Former Smoker    Packs/day: 3.00    Years: 10.00    Pack years: 30.00    Types: Cigarettes    Last attempt to quit: 09/23/1970    Years since quitting: 47.1  . Smokeless tobacco: Never Used  . Tobacco comment: quit in the 1970's  Substance and Sexual Activity  . Alcohol use: Yes    Alcohol/week: 0.6 oz    Types: 1 Shots of liquor per week    Comment: occasional; once every 6 months  . Drug use: No  . Sexual activity: Yes  Other Topics Concern  . Not on file  Social History Narrative   Marital status: married x 45 years.      Children: 2 daughters, 2 sons; 6 grandchildren.      Lives: with wife.        Employment:  [employer] x 13 years; moderately happy.  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      Tobacco: quit 42 years ago; smoked x 10 years.       Alcohol: socially; weekends.  Rarely.      Drugs: none      Exercise:  None; work is physical.        Seatbelt: 100%      Sunscreen: rare sun exposure      Guns:  4 gun; unloaded, not stored in locked cabinet.       Smoke alarm and carbon monoxide detector in the home.      Caffeine use: consumes a minimal amount   Living Will: patient does NOT have Living Will; Does NOT have HCPOA; getting ready to work on it   Organ Donor: NO          FAMILY HISTORY:   Family Status  Relation Name Status  . Mother  Deceased at age 51         dementia  . Father  Deceased at age 61       PD, heart disease  . Sister  Alive       unknown  . Brother  Deceased at age 64       Parkinson's disease  . Sister  Alive       unknown  . Brother  Alive       unknown  . Brother  Alive       unknown  . Daughter  Alive       tremor  . Son  Alive       healthy    ROS:  A complete 10 system review of systems was obtained and was unremarkable apart from what is mentioned above.  PHYSICAL EXAMINATION:    VITALS:   Vitals:   11/17/17 1422  BP: 128/66  Pulse: 64  SpO2: 96%  Weight: 210 lb (95.3 kg)  Height: 5' 11"  (1.803 m)    GEN:  The patient appears stated age and is in NAD.  Pt  diaphoretic HEENT:  Normocephalic, atraumatic.  The mucous membranes are moist. The superficial temporal arteries are without ropiness or tenderness. CV:  RRR Lungs:  CTAB Neck/HEME:  There are no carotid bruits bilaterally. Skin:  Battery sites and scalp incisions are well healed.    Neurological examination:  Orientation: The patient is alert and oriented x3.  Cranial nerves: There is good facial symmetry.  No facial asymmetry.  The visual fields are full to confrontational testing. The speech is fluent again and clear.   Soft palate rises symmetrically and there is no tongue deviation. Hearing is intact to conversational tone. Sensation: Sensation is intact to light and pinprick throughout (facial, trunk, extremities).  There is no extinction with double simultaneous stimulation. There is no sensory dermatomal level identified. Motor: Strength is 5/5 in the bilateral upper and lower extremities.   Shoulder shrug is equal and symmetric.  There is no pronator drift.   Movement examination: Tone: There is normal tone in the bilateral upper extremities.  The tone in the lower extremities is normal.  Abnormal movements:  He has tremor on the R as he approaches an object.  It is mild with just the outstretched hands, but when given a weight it  becomes at least moderate.  There is no tremor on the left.  He is able to pour water from one glass to another without spilling much of it. Coordination:  There is good RAMs. Gait and Station: The patient has no difficulty arising out of a deep-seated chair without the use of the hands. The patient's stride length is normal.  The patient has a negative pull test.    DBS programming is performed today.  No changes were made.  I did check impedances given possibility of preop MRI.  These were all unremarkable.  ASSESSMENT/PLAN:  1.  Essential Tremor  -The patient had a dat scan at [facility] since our last visit.  There was mild asymmetry of uptake in caudates, less on the left than the right of uncertain etiology and could be from prominent perivascular space or from remote lacute but said not typical for PD or any parkinsonian state.  He had an MRI of the brain in 2016 that was unremarkable.  -Patient underwent bilateral VIM DBS on 10/07/2016.  He had bilateral IPG placement on 10/23/2016.  Patient's DBS device was activated, 11/07/2016.  Unfortunately, he had a cerebral absess on the left and the L system was removed on 03/10/17.  He is infection free and no evidence of infection on last CT in 07/2017.  -talked about value of repeating pre-op MRI scan for surgical planning.  He does have some encephalomalacia from when he had the abscess.  It may be of value to repeat his scan before we do surgery again.  He and his wife are agreeable.  -Patient asked me about scheduling his preop testing the same day as fiducials.  I will see if we can do that.  -pt very much wants to have his left DBS replaced.  He completely and fully understands the risks, benefits, and side effects. I talked to the patient about the logistics associated with DBS therapy.  We talked about risks which included but were not limited to infection, paralysis, intraoperative seizure, death, stroke, bleeding around the electrode.  We  discussed the fact that bilateral DBS VIM can increase risk of loss of balance in about 30% of cases.  Understanding was expressed.  He would like to continue with lead placement.  His  wife was leery, but in agreement.  She asked several questions and I answered them to the best of my ability today.  -Patient just started a new job.  I made sure with him that this was a good time to do his neck surgery.  He has not scheduled very much time off work and I told him I think he needed to reconsider how much time it takes to recover from DBS.  I would like to see him off at least for a few weeks.  2. Follow up is anticipated in the next few months, sooner should new neurologic issues arise.  Much greater than 50% of this visit was spent in counseling and coordinating care.  Total face to face time:  30 min

## 2017-11-17 ENCOUNTER — Ambulatory Visit (INDEPENDENT_AMBULATORY_CARE_PROVIDER_SITE_OTHER): Payer: Medicare Other | Admitting: Neurology

## 2017-11-17 ENCOUNTER — Telehealth: Payer: Self-pay | Admitting: Neurology

## 2017-11-17 ENCOUNTER — Encounter: Payer: Self-pay | Admitting: Neurology

## 2017-11-17 VITALS — BP 128/66 | HR 64 | Ht 71.0 in | Wt 210.0 lb

## 2017-11-17 DIAGNOSIS — G250 Essential tremor: Secondary | ICD-10-CM | POA: Diagnosis not present

## 2017-11-17 DIAGNOSIS — Z01818 Encounter for other preprocedural examination: Secondary | ICD-10-CM

## 2017-11-17 NOTE — Telephone Encounter (Signed)
Called to schedule MR brain - preop DBS Protocol. [facility] scheduling would not schedule or take the information because patient has a DBS battery in place.   I spoke with Jerene Pitch in [facility] MR department. She states that protocol is for scheduling to take patient information and send to them, which they would not do. Char or Albina Billet not available to speak with about scheduling.   I sent information on patient to [facility] at [facility] MR to fax number [phone] with confirmation received. Request that MR be scheduled prior to 11/24/17.   Dr. Carles Collet Juluis Rainier.

## 2017-11-17 NOTE — Procedures (Signed)
DBS Programming was performed.    Total time spent programming was 10 minutes.  Device was confirmed to be on.  Soft start was confirmed to be on.  Impedences were checked and were within normal limits.  Battery was checked and was determined to be functioning normally and not near the end of life.  Final settings were as follows:  Left brain electrode:     n/a  Right brain electrode:     2-1+          ; Amplitude   2.7  V ;  Pulse width 90  microseconds;  Frequency   150    Hz.   Impedance check: Contact [phone], contact [phone], contact [phone], contact [phone]

## 2017-11-19 ENCOUNTER — Other Ambulatory Visit: Payer: Self-pay | Admitting: Neurology

## 2017-11-19 ENCOUNTER — Ambulatory Visit (HOSPITAL_COMMUNITY): Admission: RE | Admit: 2017-11-19 | Payer: Medicare Other | Source: Ambulatory Visit

## 2017-11-19 DIAGNOSIS — Z01818 Encounter for other preprocedural examination: Secondary | ICD-10-CM

## 2017-11-20 ENCOUNTER — Ambulatory Visit (HOSPITAL_COMMUNITY)
Admission: RE | Admit: 2017-11-20 | Discharge: 2017-11-20 | Disposition: A | Discharge status: Home / Self Care | Payer: Medicare Other | Source: Ambulatory Visit | Attending: Neurology | Admitting: Neurology

## 2017-11-20 DIAGNOSIS — Z9689 Presence of other specified functional implants: Secondary | ICD-10-CM | POA: Diagnosis not present

## 2017-11-20 DIAGNOSIS — G930 Cerebral cysts: Secondary | ICD-10-CM | POA: Insufficient documentation

## 2017-11-20 DIAGNOSIS — Z01818 Encounter for other preprocedural examination: Secondary | ICD-10-CM | POA: Insufficient documentation

## 2017-11-20 DIAGNOSIS — M47892 Other spondylosis, cervical region: Secondary | ICD-10-CM | POA: Insufficient documentation

## 2017-11-20 DIAGNOSIS — G250 Essential tremor: Secondary | ICD-10-CM | POA: Diagnosis present

## 2017-11-20 DIAGNOSIS — R51 Headache: Secondary | ICD-10-CM | POA: Diagnosis not present

## 2017-11-20 LAB — CREATININE, SERUM
Creatinine, Ser: 1.11 mg/dL (ref 0.61–1.24)
GFR calc Af Amer: >60 mL/min (ref >60)
GFR calc non Af Amer: >60 mL/min (ref >60)

## 2017-11-24 ENCOUNTER — Ambulatory Visit (HOSPITAL_COMMUNITY)
Admission: RE | Admit: 2017-11-24 | Discharge: 2017-11-24 | Disposition: A | Discharge status: Home / Self Care | Payer: Medicare Other | Source: Ambulatory Visit | Attending: Neurosurgery | Admitting: Neurosurgery

## 2017-11-24 ENCOUNTER — Encounter (HOSPITAL_COMMUNITY)
Admission: RE | Disposition: A | Discharge status: Home / Self Care | Payer: Self-pay | Source: Ambulatory Visit | Attending: Neurosurgery

## 2017-11-24 DIAGNOSIS — G250 Essential tremor: Secondary | ICD-10-CM

## 2017-11-24 DIAGNOSIS — K219 Gastro-esophageal reflux disease without esophagitis: Secondary | ICD-10-CM | POA: Diagnosis not present

## 2017-11-24 DIAGNOSIS — Z79899 Other long term (current) drug therapy: Secondary | ICD-10-CM | POA: Diagnosis not present

## 2017-11-24 DIAGNOSIS — Z7982 Long term (current) use of aspirin: Secondary | ICD-10-CM | POA: Insufficient documentation

## 2017-11-24 DIAGNOSIS — G259 Extrapyramidal and movement disorder, unspecified: Secondary | ICD-10-CM | POA: Diagnosis not present

## 2017-11-24 DIAGNOSIS — G060 Intracranial abscess and granuloma: Secondary | ICD-10-CM | POA: Diagnosis not present

## 2017-11-24 DIAGNOSIS — I10 Essential (primary) hypertension: Secondary | ICD-10-CM | POA: Diagnosis not present

## 2017-11-24 HISTORY — PX: MINOR PLACEMENT OF FIDUCIAL: SHX6748

## 2017-11-24 SURGERY — MINOR PLACEMENT OF FIDUCIAL
Anesthesia: LOCAL

## 2017-11-24 MED ORDER — BACITRACIN ZINC 500 UNIT/GM EX OINT
TOPICAL_OINTMENT | CUTANEOUS | Status: AC
  Filled 2017-11-24: qty 28.35

## 2017-11-24 MED ORDER — LIDOCAINE-EPINEPHRINE 1 %-1:100000 IJ SOLN
INTRAMUSCULAR | Status: AC
  Filled 2017-11-24: qty 1

## 2017-11-24 MED ORDER — SODIUM BICARBONATE 4 % IV SOLN
INTRAVENOUS | Status: AC
  Filled 2017-11-24: qty 5

## 2017-11-24 SURGICAL SUPPLY — 18 items
BAG ATCL THK3 35X25 (MISCELLANEOUS) ×1 IMPLANT
BAG BIOHAZARD 25X35 (MISCELLANEOUS) ×1
BANDAGE ADH SHEER 1  50/CT (GAUZE/BANDAGES/DRESSINGS) ×10 IMPLANT
BLADE CLIPPER SPEC (BLADE) ×2 IMPLANT
BLADE SURG 11 STRL SS (BLADE) ×2 IMPLANT
COVER BACK TABLE 60X90IN (DRAPES) ×2 IMPLANT
DRAPE HALF SHEET 40X57 (DRAPES) ×2 IMPLANT
DRAPE SHEET LG 3/4 BI-LAMINATE (DRAPES) ×2 IMPLANT
GAUZE SPONGE 4X4 12PLY STRL (GAUZE/BANDAGES/DRESSINGS) ×6 IMPLANT
GLOVE BIO SURGEON STRL SZ8 (GLOVE) ×4 IMPLANT
GLOVE ECLIPSE 8.5 STRL (GLOVE) ×2 IMPLANT
NEEDLE HYPO 18GX1.5 BLUNT FILL (NEEDLE) ×2 IMPLANT
NEEDLE HYPO 25X1 1.5 SAFETY (NEEDLE) ×2 IMPLANT
SOL PREP POV-IOD 4OZ 10% (MISCELLANEOUS) ×2 IMPLANT
STAPLER SKIN PROX WIDE 3.9 (STAPLE) ×2 IMPLANT
SUT ETHILON 3 0 PS 1 (SUTURE) ×10 IMPLANT
SYR CONTROL 10ML LL (SYRINGE) ×2 IMPLANT
TOWEL GREEN STERILE (TOWEL DISPOSABLE) ×2 IMPLANT

## 2017-11-24 NOTE — Progress Notes (Addendum)
After scalp cleaned, and Carlos Hunt injected lido/epi, patient stated he felt dizzy. Oxygen at 2l/[place] had been applied prior to procedure. Carlos Hunt became diaphoretic, unable to follow simple commands. We immediately placed him on stretcher, oxygen still going. Placed on monitors. 8:20-8:22 Patient became more responsive.  Stated he was feeling some better 62 HR  99% oxygen PB 137/69 8:35  Procedure has continued with patient in semi recumbent position. 143/71, HR 61, Oxygen 99% 8:45am  Patient is talking and coherent.  Feels much better.  Procedure almost complete. 8:55am  Patient has been transported to CT by PA.  VSS, talking and feeling a lot better.

## 2017-11-24 NOTE — Op Note (Signed)
Indications:  Patient has essential tremor and presents for Star Fix Fiducial Placement for upcoming DBS VIM placement.    Procedure:  Patient was brought to the preoperative area.  His scalp had been shaved.  Areas of planned fiducial placement were marked, scalp was prepped with Duraprep.  Scalp was infiltrated with lidocaine with epinephrine with Sodium Bicarbonate.  Four fiducials were placed according to standard landmarks through stab incisions.  3-0 Nylon sutures were placed and sterile dressings were applied.  Patient tolerated procedure well.  He was taken to CT for imaging.

## 2017-11-24 NOTE — Interval H&P Note (Signed)
History and Physical Interval Note:  11/24/2017 7:55 AM  Carlos Hunt  has presented today for surgery, with the diagnosis of Essential tremor  The various methods of treatment have been discussed with the patient and family. After consideration of risks, benefits and other options for treatment, the patient has consented to  Procedure(s) with comments: MINOR PLACEMENT OF FIDUCIAL (N/A) - Fiducial placement as a surgical intervention .  The patient's history has been reviewed, patient examined, no change in status, stable for surgery.  I have reviewed the patient's chart and labs.  Questions were answered to the patient's satisfaction.     Danyeal Akens D

## 2017-11-24 NOTE — H&P (View-Only) (Signed)
Patient ID:   [id number] Patient: Carlos Hunt  Date of Birth: June 29, 1946 Visit Type: Office Visit   Date: 05/19/2017 03:15 PM Provider: Marchia Meiers. Vertell Limber MD   This 72 year old male presents for follow up.   History of Present Illness: 1.  follow up  03/11/2017 left DBS removal  Patient returns as planned, pleased with his current status.  IV antibiotics have completed.  He is to see [name] next week.  He has passed his speech and physical therapy tests.   The patient is doing well.  He is not having any symptoms related to his brain abscess .  This appears to be resolved.  His baseline  tremor has returned   On the right and his tremor remains resolved  on the left.    The patient is interested in going ahead  with repeat deep brain stimulator  placement once he is cleared  from infection issues.  He is going to see Dr. Carles Collet and will follow up with   me after that.           PAST MEDICAL/SURGICAL HISTORY   (Reviewed, updated)  Disease/disorder Onset Date Management Date Comments    left DBS removal 03/11/2017     bilateral implanted pulse generator placement w/extensions 10/23/2016     bilateral deep brain stimulator for essential tremor 10/07/2016     fiducial placement 09/30/2016   Decreased vitamin D    [facility] 06/20/2016 -  GERD      Hand fracture    [facility] 06/20/2016 -  Hypertension      Tremor          [facility] 06/20/2016 -     PAST MEDICAL HISTORY, SURGICAL HISTORY, FAMILY HISTORY, SOCIAL HISTORY AND REVIEW OF SYSTEMS I have reviewed the patient's past medical, surgical, family and social history as well as the comprehensive review of systems as included on the [facility] history form dated 07/02/2016, which I have signed.  Family History: Reviewed, no changes.  Last detailed document date:07/02/2016.   Social History: Reviewed, no changes. Last detailed document date: 07/02/2016.    MEDICATIONS(added, continued or stopped this  visit): Started Medication Directions Instruction Stopped   aspirin  34m ORAL 1 by mouth daily     Glucosamine 500 mg tablet      hydrochlorothiazide 25 mg tablet take 1 tablet by oral route  every day     Intrinsi B12-Folate 500 mcg-20 mg-800 mcg tablet      lisinopril 10 mg tablet take 1 tablet by oral route  every day     primidone 50 mg tablet take 5 tablet by oral route 3 times every day     tramadol 50 mg tablet take 1 tablet by oral route  every 6 hours as needed  05/19/2017     ALLERGIES: Ingredient Reaction Medication Name Comment  PROCAINE HCL  Novocain    Reviewed, no changes.    Vitals Date Temp F BP Pulse Ht In Wt Lb BMI BSA Pain Score  05/19/2017  137/74 76 71 213 29.71  0/10      IMPRESSION  Essential tremor   with brain abscess  Completed Orders (this encounter) Order Details Reason Side Interpretation Result Initial Treatment Date Region  Hypertension education Patient to follow up with primary care provider.        Dietary management education, guidance, and counseling patient encouraged to eat a well balanced diet  Assessment/Plan # Detail Type Description   1. Assessment Brain abscess (G06.0).       2. Assessment Essential tremor (G25.0).       3. Assessment Essential (primary) hypertension (I10).       4. Assessment Body mass index (BMI) 29.0-29.9, adult (A15.87).   Plan Orders Today's instructions / counseling include(s) Dietary management education, guidance, and counseling.           Pain Management Plan Pain Scale: 0/10. Method: Numeric Pain Intensity Scale. Onset: 09/30/2016.    follow-up as needed  Orders: Instruction(s)/Education: Assessment Instruction  I10 Hypertension education  Z68.29 Dietary management education, guidance, and counseling             Provider:  Vertell Limber MD, Marchia Meiers 05/19/2017 5:11 PM  Dictation edited by: Marchia Meiers. Vertell Limber    CC Providers: Wells Guiles Tat  [address] [place], [place]  [id number]              Electronically signed by Marchia Meiers Vertell Limber MD on 05/19/2017 05:11 PM

## 2017-11-24 NOTE — H&P (Signed)
Patient ID:   [id number] Patient: Carlos Hunt  Date of Birth: 1946-06-17 Visit Type: Office Visit   Date: 05/19/2017 03:15 PM Provider: Marchia Meiers. Vertell Limber MD   This 72 year old male presents for follow up.   History of Present Illness: 1.  follow up  03/11/2017 left DBS removal  Patient returns as planned, pleased with his current status.  IV antibiotics have completed.  He is to see [name] next week.  He has passed his speech and physical therapy tests.   The patient is doing well.  He is not having any symptoms related to his brain abscess .  This appears to be resolved.  His baseline  tremor has returned   On the right and his tremor remains resolved  on the left.    The patient is interested in going ahead  with repeat deep brain stimulator  placement once he is cleared  from infection issues.  He is going to see Dr. Carles Collet and will follow up with   me after that.           PAST MEDICAL/SURGICAL HISTORY   (Reviewed, updated)  Disease/disorder Onset Date Management Date Comments    left DBS removal 03/11/2017     bilateral implanted pulse generator placement w/extensions 10/23/2016     bilateral deep brain stimulator for essential tremor 10/07/2016     fiducial placement 09/30/2016   Decreased vitamin D    [facility] 06/20/2016 -  GERD      Hand fracture    [facility] 06/20/2016 -  Hypertension      Tremor          [facility] 06/20/2016 -     PAST MEDICAL HISTORY, SURGICAL HISTORY, FAMILY HISTORY, SOCIAL HISTORY AND REVIEW OF SYSTEMS I have reviewed the patient's past medical, surgical, family and social history as well as the comprehensive review of systems as included on the [facility] history form dated 07/02/2016, which I have signed.  Family History: Reviewed, no changes.  Last detailed document date:07/02/2016.   Social History: Reviewed, no changes. Last detailed document date: 07/02/2016.    MEDICATIONS(added, continued or stopped this  visit): Started Medication Directions Instruction Stopped   aspirin  367m ORAL 1 by mouth daily     Glucosamine 500 mg tablet      hydrochlorothiazide 25 mg tablet take 1 tablet by oral route  every day     Intrinsi B12-Folate 500 mcg-20 mg-800 mcg tablet      lisinopril 10 mg tablet take 1 tablet by oral route  every day     primidone 50 mg tablet take 5 tablet by oral route 3 times every day     tramadol 50 mg tablet take 1 tablet by oral route  every 6 hours as needed  05/19/2017     ALLERGIES: Ingredient Reaction Medication Name Comment  PROCAINE HCL  Novocain    Reviewed, no changes.    Vitals Date Temp F BP Pulse Ht In Wt Lb BMI BSA Pain Score  05/19/2017  137/74 76 71 213 29.71  0/10      IMPRESSION  Essential tremor   with brain abscess  Completed Orders (this encounter) Order Details Reason Side Interpretation Result Initial Treatment Date Region  Hypertension education Patient to follow up with primary care provider.        Dietary management education, guidance, and counseling patient encouraged to eat a well balanced diet  Assessment/Plan # Detail Type Description   1. Assessment Brain abscess (G06.0).       2. Assessment Essential tremor (G25.0).       3. Assessment Essential (primary) hypertension (I10).       4. Assessment Body mass index (BMI) 29.0-29.9, adult (V81.84).   Plan Orders Today's instructions / counseling include(s) Dietary management education, guidance, and counseling.           Pain Management Plan Pain Scale: 0/10. Method: Numeric Pain Intensity Scale. Onset: 09/30/2016.    follow-up as needed  Orders: Instruction(s)/Education: Assessment Instruction  I10 Hypertension education  Z68.29 Dietary management education, guidance, and counseling             Provider:  Vertell Limber MD, Marchia Meiers 05/19/2017 5:11 PM  Dictation edited by: Marchia Meiers. Vertell Limber    CC Providers: Wells Guiles Tat  [address] [place], [place]  [phone]              Electronically signed by Marchia Meiers Vertell Limber MD on 05/19/2017 05:11 PM

## 2017-11-24 NOTE — H&P (View-Only) (Signed)
Patient ID:   [id number] Patient: Carlos Hunt  Date of Birth: [birth year] Visit Type: Office Visit   Date: 05/19/2017 03:15 PM Provider: Marchia Meiers. Vertell Limber MD   This 72 year old male presents for follow up.   History of Present Illness: 1.  follow up  03/11/2017 left DBS removal  Patient returns as planned, pleased with his current status.  IV antibiotics have completed.  He is to see [name] next week.  He has passed his speech and physical therapy tests.   The patient is doing well.  He is not having any symptoms related to his brain abscess .  This appears to be resolved.  His baseline  tremor has returned   On the right and his tremor remains resolved  on the left.    The patient is interested in going ahead  with repeat deep brain stimulator  placement once he is cleared  from infection issues.  He is going to see Dr. Carles Collet and will follow up with   me after that.           PAST MEDICAL/SURGICAL HISTORY   (Reviewed, updated)  Disease/disorder Onset Date Management Date Comments    left DBS removal 03/11/2017     bilateral implanted pulse generator placement w/extensions 10/23/2016     bilateral deep brain stimulator for essential tremor 10/07/2016     fiducial placement 09/30/2016   Decreased vitamin D    [facility] 06/20/2016 -  GERD      Hand fracture    [facility] 06/20/2016 -  Hypertension      Tremor          [facility] 06/20/2016 -     PAST MEDICAL HISTORY, SURGICAL HISTORY, FAMILY HISTORY, SOCIAL HISTORY AND REVIEW OF SYSTEMS I have reviewed the patient's past medical, surgical, family and social history as well as the comprehensive review of systems as included on the [facility] history form dated 07/02/2016, which I have signed.  Family History: Reviewed, no changes.  Last detailed document date:07/02/2016.   Social History: Reviewed, no changes. Last detailed document date: 07/02/2016.    MEDICATIONS(added, continued or stopped this  visit): Started Medication Directions Instruction Stopped   aspirin  340m ORAL 1 by mouth daily     Glucosamine 500 mg tablet      hydrochlorothiazide 25 mg tablet take 1 tablet by oral route  every day     Intrinsi B12-Folate 500 mcg-20 mg-800 mcg tablet      lisinopril 10 mg tablet take 1 tablet by oral route  every day     primidone 50 mg tablet take 5 tablet by oral route 3 times every day     tramadol 50 mg tablet take 1 tablet by oral route  every 6 hours as needed  05/19/2017     ALLERGIES: Ingredient Reaction Medication Name Comment  PROCAINE HCL  Novocain    Reviewed, no changes.    Vitals Date Temp F BP Pulse Ht In Wt Lb BMI BSA Pain Score  05/19/2017  137/74 76 71 213 29.71  0/10      IMPRESSION  Essential tremor   with brain abscess  Completed Orders (this encounter) Order Details Reason Side Interpretation Result Initial Treatment Date Region  Hypertension education Patient to follow up with primary care provider.        Dietary management education, guidance, and counseling patient encouraged to eat a well balanced diet  Assessment/Plan # Detail Type Description   1. Assessment Brain abscess (G06.0).       2. Assessment Essential tremor (G25.0).       3. Assessment Essential (primary) hypertension (I10).       4. Assessment Body mass index (BMI) 29.0-29.9, adult (P53.74).   Plan Orders Today's instructions / counseling include(s) Dietary management education, guidance, and counseling.           Pain Management Plan Pain Scale: 0/10. Method: Numeric Pain Intensity Scale. Onset: 09/30/2016.    follow-up as needed  Orders: Instruction(s)/Education: Assessment Instruction  I10 Hypertension education  Z68.29 Dietary management education, guidance, and counseling             Provider:  Vertell Limber MD, Marchia Meiers 05/19/2017 5:11 PM  Dictation edited by: Marchia Meiers. Vertell Limber    CC Providers: Wells Guiles Tat  [address] [place], [place]  [phone]              Electronically signed by Marchia Meiers Vertell Limber MD on 05/19/2017 05:11 PM

## 2017-11-26 ENCOUNTER — Encounter (HOSPITAL_COMMUNITY): Payer: Self-pay | Admitting: Neurosurgery

## 2017-11-30 ENCOUNTER — Ambulatory Visit (INDEPENDENT_AMBULATORY_CARE_PROVIDER_SITE_OTHER): Payer: Medicare Other | Admitting: Family Medicine

## 2017-11-30 ENCOUNTER — Encounter: Payer: Self-pay | Admitting: Family Medicine

## 2017-11-30 ENCOUNTER — Other Ambulatory Visit: Payer: Self-pay

## 2017-11-30 VITALS — BP 128/72 | HR 77 | Temp 97.6°F | Resp 18 | Ht 68.9 in | Wt 200.2 lb

## 2017-11-30 DIAGNOSIS — Z6829 Body mass index (BMI) 29.0-29.9, adult: Secondary | ICD-10-CM | POA: Diagnosis not present

## 2017-11-30 DIAGNOSIS — Z125 Encounter for screening for malignant neoplasm of prostate: Secondary | ICD-10-CM | POA: Diagnosis not present

## 2017-11-30 DIAGNOSIS — D369 Benign neoplasm, unspecified site: Secondary | ICD-10-CM

## 2017-11-30 DIAGNOSIS — G250 Essential tremor: Secondary | ICD-10-CM

## 2017-11-30 DIAGNOSIS — Z0000 Encounter for general adult medical examination without abnormal findings: Secondary | ICD-10-CM

## 2017-11-30 DIAGNOSIS — E7800 Pure hypercholesterolemia, unspecified: Secondary | ICD-10-CM | POA: Diagnosis not present

## 2017-11-30 DIAGNOSIS — I10 Essential (primary) hypertension: Secondary | ICD-10-CM

## 2017-11-30 LAB — POCT URINALYSIS DIP (MANUAL ENTRY)
Bilirubin, UA: NEGATIVE
Blood, UA: NEGATIVE
Glucose, UA: NEGATIVE mg/dL
Ketones, POC UA: NEGATIVE mg/dL
Leukocytes, UA: NEGATIVE
Nitrite, UA: NEGATIVE
Protein Ur, POC: NEGATIVE mg/dL
Spec Grav, UA: 1.025 (ref 1.010–1.025)
Urobilinogen, UA: 0.2 E.U./dL
pH, UA: 5.5 (ref 5.0–8.0)

## 2017-11-30 NOTE — Progress Notes (Signed)
Subjective:    Patient ID: Carlos Hunt, male    DOB: May 30, 1946, 72 y.o.   MRN: [id number]  11/30/2017  Medicare Wellness    HPI This 71 y.o. male presents for [place] and six month follow-up of hypertension, essential tremor.    Visual Acuity Screening   Right eye Left eye Both eyes  Without correction:     With correction: 20/20-1 20/20-1 20/20    BP Readings from Last 3 Encounters:  01/25/18 132/62  01/12/18 (!) 165/69  12/22/17 137/68   Wt Readings from Last 3 Encounters:  01/25/18 204 lb (92.5 kg)  01/12/18 201 lb (91.2 kg)  12/22/17 201 lb (91.2 kg)   Immunization History  Administered Date(s) Administered  . Influenza, Seasonal, Injecte, Preservative Fre 09/23/2012  . Influenza,inj,Quad PF,6+ Mos 07/27/2013, 05/25/2015, 06/03/2016, 06/02/2017  . Influenza-Unspecified 06/08/2014  . Pneumococcal Conjugate-13 11/13/2014  . Pneumococcal Polysaccharide-23 11/27/2015  . Tdap 09/23/2012   Health Maintenance  Topic Date Due  . INFLUENZA VACCINE  04/08/2018  . TETANUS/TDAP  09/23/2022  . COLONOSCOPY  07/18/2026  . Hepatitis C Screening  Completed  . PNA vac Low Risk Adult  Completed    Essential Tremor:  Will undergo deep brain stimulator placement again this week.    HTN: Patient reports good compliance with medication, good tolerance to medication, and good symptom control.    S/p hernia surgery in October 2018.  Cleared for work the next week.   Review of Systems  Constitutional: Negative for activity change, appetite change, chills, diaphoresis, fatigue, fever and unexpected weight change.  HENT: Negative for congestion, dental problem, drooling, ear discharge, ear pain, facial swelling, hearing loss, mouth sores, nosebleeds, postnasal drip, rhinorrhea, sinus pressure, sneezing, sore throat, tinnitus, trouble swallowing and voice change.   Eyes: Negative for photophobia, pain, discharge, redness, itching and visual disturbance.    Respiratory: Negative for apnea, cough, choking, chest tightness, shortness of breath, wheezing and stridor.   Cardiovascular: Negative for chest pain, palpitations and leg swelling.  Gastrointestinal: Negative for abdominal pain, blood in stool, constipation, diarrhea, nausea and vomiting.  Endocrine: Negative for cold intolerance, heat intolerance, polydipsia, polyphagia and polyuria.  Genitourinary: Negative for decreased urine volume, difficulty urinating, discharge, dysuria, enuresis, flank pain, frequency, genital sores, hematuria, penile pain, penile swelling, scrotal swelling, testicular pain and urgency.       Nocturia x 1.  Urinary stream is strong.  Drinks a lot of water.    Musculoskeletal: Positive for arthralgias. Negative for back pain, gait problem, joint swelling, myalgias, neck pain and neck stiffness.  Skin: Negative for color change, pallor, rash and wound.  Allergic/Immunologic: Negative for environmental allergies, food allergies and immunocompromised state.  Neurological: Negative for dizziness, tremors, seizures, syncope, facial asymmetry, speech difficulty, weakness, light-headedness, numbness and headaches.  Hematological: Negative for adenopathy. Does not bruise/bleed easily.  Psychiatric/Behavioral: Negative for agitation, behavioral problems, confusion, decreased concentration, dysphoric mood, hallucinations, self-injury, sleep disturbance and suicidal ideas. The patient is not nervous/anxious and is not hyperactive.        Bedtime 12-1; wakes up 700.    Past Medical History:  Diagnosis Date  . Adenomatous polyps 04/02/2017  . Brain abscess 03/11/2017  . Elevated liver enzymes   . Erectile dysfunction   . Essential hypertension, benign 02/14/2013  . Essential tremor   . GERD (gastroesophageal reflux disease)   . Head injury   . Hx of colonic polyps   . Hypertension   . Pain in left knee   .  Pain, joint, shoulder   . PONV (postoperative nausea and vomiting)     nausea no vomiting  . Seasonal allergies   . Testicular hypofunction   . Tobacco use disorder   . Tremor   . Unspecified vitamin D deficiency    Past Surgical History:  Procedure Laterality Date  . COLONOSCOPY  09/09/2011   normal.  Previous polyps.  [name]. Repeat 5 years.  . COLONOSCOPY N/A 07/18/2016   Procedure: COLONOSCOPY;  Surgeon: Manya Silvas, MD;  Location: [facility] ENDOSCOPY;  Service: Endoscopy;  Laterality: N/A;  . HAND SURGERY Left 2011   trauma  . LAPAROSCOPIC INGUINAL HERNIA WITH UMBILICAL HERNIA Right 16/09/958   Procedure: LAPAROSCOPIC INGUINAL HERNIA REPAIR, right with umbilical herniorrhaphy;  Surgeon: Florene Glen, MD;  Location: [facility] ORS;  Service: General;  Laterality: Right;  . MINOR PLACEMENT OF FIDUCIAL N/A 11/24/2017   Procedure: MINOR PLACEMENT OF FIDUCIAL;  Surgeon: Erline Levine, MD;  Location: [place];  Service: Neurosurgery;  Laterality: N/A;  Fiducial placement  . PULSE GENERATOR IMPLANT Bilateral 10/23/2016   Procedure: BILATERAL IMPLANTABLE PULSE GENERATOR;  Surgeon: Erline Levine, MD;  Location: [place];  Service: Neurosurgery;  Laterality: Bilateral;  BILATERAL IMPLANTABLE PULSE GENERATOR  . PULSE GENERATOR IMPLANT Left 01/12/2018   Procedure: UNILATERAL PULSE GENERATOR IMPLANT AND EXTENTIONS FOR DEEP BRAIN STIMULATOR;  Surgeon: Erline Levine, MD;  Location: [place];  Service: Neurosurgery;  Laterality: Left;  . SUBTHALAMIC STIMULATOR INSERTION Bilateral 10/07/2016   Procedure: BILATERAL DEEP BRAIN STIMULATOR PLACEMENT;  Surgeon: Erline Levine, MD;  Location[place];  Service: Neurosurgery;  Laterality: Bilateral;  BILATERAL DEEP BRAIN STIMULATOR PLACEMENT  . SUBTHALAMIC STIMULATOR INSERTION Left 12/04/2017   Procedure: Left Deep Brain Stimulator Placement;  Surgeon: Erline Levine, MD;  Location: [place];  Service: Neurosurgery;  Laterality: Left;  Left deep brain stimulator placement  . SUBTHALAMIC STIMULATOR REMOVAL Left 03/10/2017   Procedure: REMOVAL DEEP BRAIN  STIMULATOR AND ASPIRATION OF BRAIN ABSCESS;  Surgeon: Earnie Larsson, MD;  Location: [place];  Service: Neurosurgery;  Laterality: Left;  . UMBILICAL HERNIA REPAIR N/A 07/15/2017   Procedure: HERNIA REPAIR UMBILICAL ADULT;  Surgeon: Florene Glen, MD;  Location: [facility] ORS;  Service: General;  Laterality: N/A;   Allergies  Allergen Reactions  . Procaine Nausea And Vomiting and Other (See Comments)    NOVOCAINE  . Sulfacetamide Sodium-Sulfur Rash   Current Outpatient Medications on File Prior to Visit  Medication Sig Dispense Refill  . hydrochlorothiazide (HYDRODIURIL) 25 MG tablet Take 1 tablet (25 mg total) by mouth daily. 90 tablet 3  . Ascorbic Acid (VITAMIN C) 1000 MG tablet Take 1,000 mg by mouth daily.    Marland Kitchen aspirin 325 MG tablet Take 325 mg by mouth daily.    . Cholecalciferol (VITAMIN D3) 2000 units TABS Take 2,000 Units by mouth daily.    . Cyanocobalamin (B-12) 3000 MCG CAPS Take 3,000 mcg by mouth daily.    . Digestive Enzymes (PAPAYA AND ENZYMES PO) Take 1 tablet by mouth daily as needed (for heartburn).     Marland Kitchen ECHINACEA PO Take 334 mg by mouth daily.    . fluticasone (FLONASE) 50 MCG/ACT nasal spray Place 1 spray into both nostrils daily as needed for allergies or rhinitis.    . Methylsulfonylmethane (MSM) 1000 MG CAPS Take 1,000 mg by mouth daily.    . Multiple Vitamins-Minerals (CENTRUM PO) Take 1 tablet by mouth daily.     . Tetrahydroz-Glyc-Hyprom-PEG (VISINE MAXIMUM REDNESS RELIEF OP) Place 1 drop into both  eyes daily as needed (for irritation).      No current facility-administered medications on file prior to visit.    Social History   Socioeconomic History  . Marital status: Married    Spouse name: [name]  . Number of children: 4  . Years of education: Not on file  . Highest education level: Not on file  Occupational History  . Occupation: building maintenance    Comment: Chemical engineer  Social Needs  . Financial resource strain: Not on file  . Food  insecurity:    Worry: Not on file    Inability: Not on file  . Transportation needs:    Medical: Not on file    Non-medical: Not on file  Tobacco Use  . Smoking status: Former Smoker    Packs/day: 3.00    Years: 10.00    Pack years: 30.00    Types: Cigarettes    Last attempt to quit: 09/23/1970    Years since quitting: 47.3  . Smokeless tobacco: Never Used  . Tobacco comment: quit in the 1970's  Substance and Sexual Activity  . Alcohol use: Yes    Alcohol/week: 0.6 oz    Types: 1 Shots of liquor per week    Comment: occasional; once every 6 months  . Drug use: No  . Sexual activity: Yes    Birth control/protection: Post-menopausal  Lifestyle  . Physical activity:    Days per week: Not on file    Minutes per session: Not on file  . Stress: Not on file  Relationships  . Social connections:    Talks on phone: Not on file    Gets together: Not on file    Attends religious service: Not on file    Active member of club or organization: Not on file    Attends meetings of clubs or organizations: Not on file    Relationship status: Not on file  . Intimate partner violence:    Fear of current or ex partner: Not on file    Emotionally abused: Not on file    Physically abused: Not on file    Forced sexual activity: Not on file  Other Topics Concern  . Not on file  Social History Narrative   Marital status: married x 46 years.      Children: 2 daughters, 2 sons; 6 grandchildren.      Lives: with wife.        Employment: RETIRED in 2018 from [employer] x 13 years; moderately happy.  In 2019, working for [employer] in [place] full time.      Tobacco: quit 42 years ago; smoked x 10 years.       Alcohol: socially; weekends.  Rarely.      Drugs: none      Exercise:  None; work is physical.        Seatbelt: 100%      Sunscreen: rare sun exposure      Guns:  4 gun; unloaded, not stored in locked cabinet.       Smoke alarm and carbon monoxide detector in the home.      Caffeine  use: consumes a minimal amount       Advanced Directives/Living Will: patient does NOT have Living Will; Does NOT have HCPOA; getting ready to work on it.     Organ Donor: NO         Family History  Problem Relation Age of Onset  . Dementia Mother   . Parkinson's disease  Father   . Cancer Father 43       prostate  . Heart disease Father 20       CAD/CABG  . Hypertension Father   . Hyperlipidemia Father   . Parkinsonism Father   . Parkinson's disease Brother   . Diabetes Sister        Objective:    BP 128/72   Pulse 77   Temp 97.6 F (36.4 C) (Oral)   Resp 18   Ht 5' 8.9" (1.75 m)   Wt 200 lb 3.2 oz (90.8 kg)   SpO2 97%   BMI 29.65 kg/m  Physical Exam  Constitutional: He is oriented to person, place, and time. He appears well-developed and well-nourished. No distress.  HENT:  Head: Normocephalic and atraumatic.  Right Ear: External ear normal.  Left Ear: External ear normal.  Nose: Nose normal.  Mouth/Throat: Oropharynx is clear and moist.  Eyes: Pupils are equal, round, and reactive to light. Conjunctivae and EOM are normal.  Neck: Normal range of motion. Neck supple. Carotid bruit is not present. No thyromegaly present.  Cardiovascular: Normal rate, regular rhythm, normal heart sounds and intact distal pulses. Exam reveals no gallop and no friction rub.  No murmur heard. Pulmonary/Chest: Effort normal and breath sounds normal. He has no wheezes. He has no rales.  Abdominal: Soft. Bowel sounds are normal. He exhibits no distension and no mass. There is no tenderness. There is no rebound and no guarding.  Genitourinary: Penis normal.  Musculoskeletal:       Right shoulder: Normal.       Left shoulder: Normal.       Cervical back: Normal.  Lymphadenopathy:    He has no cervical adenopathy.  Neurological: He is alert and oriented to person, place, and time. He has normal strength and normal reflexes. He displays tremor. No cranial nerve deficit or sensory deficit.  He exhibits normal muscle tone. Coordination and gait normal.  Skin: Skin is warm and dry. No rash noted. He is not diaphoretic.  Psychiatric: He has a normal mood and affect. His behavior is normal. Judgment and thought content normal.   No results found. Depression screen [facility] 2/9 12/07/2017 11/30/2017 09/24/2017 06/04/2017 06/02/2017  Decreased Interest 0 0 0 0 0  Down, Depressed, Hopeless 0 0 0 0 0  PHQ - 2 Score 0 0 0 0 0   Fall Risk  01/25/2018 12/07/2017 11/30/2017 11/17/2017 09/24/2017  Falls in the past year? No No No No No  Number falls in past yr: - - - - -  Injury with Fall? - - - - -  Follow up - - - - -   Functional Status Survey: Is the patient deaf or have difficulty hearing?: No Does the patient have difficulty seeing, even when wearing glasses/contacts?: No Does the patient have difficulty concentrating, remembering, or making decisions?: No Does the patient have difficulty walking or climbing stairs?: No Does the patient have difficulty dressing or bathing?: No Does the patient have difficulty doing errands alone such as visiting a doctor's office or shopping?: No      Assessment & Plan:   1. Encounter for Medicare annual wellness exam   2. Essential hypertension, benign   3. Adenomatous polyps   4. Essential tremor   5. Screening for prostate cancer   6. Pure hypercholesterolemia   7. BMI 29.0-29.9,adult     -anticipatory guidance provided --- exercise, weight loss, safe driving practices, aspirin 3m daily. -obtain age appropriate screening labs  and labs for chronic disease management. -moderate fall risk; no evidence of depression; no evidence of hearing loss.  Discussed advanced directives and living will; also discussed end of life issues including code status.  -Hypertension and hypercholesterolemia controlled moderately.  Obtain labs for chronic disease management.  No changes of therapy at this time. -Essential tremor: Uncontrolled at this time.  Scheduled for  replacement of nerve stimulator by neurosurgery. -Status post recent hernia repair in good postoperative recovery. -Left kidney angiomyolipoma: Will warrant repeat imaging in 11-2018. -BMI 29: Recommend weight loss, exercise for 30-60 minutes five days per week; recommend 1800 kcal restriction per day with a minimum of 60 grams of protein per day.  Eat 3 meals per day. Do not skip meals. Consider having a protein shake as a meal replacement to aid with eliminating meal skipping. Look for products with <220 calories, <7 gm sugar, and 20-30 gm protein.  Eat breakfast within 2 hours of getting up.   Make  your plate non-starchy vegetables,  protein, and  carbohydrates at lunch and dinner.   Aim for at least 64 oz. of calorie-free beverages daily (water, Crystal Light, diet green tea, etc.). Eliminate any sugary beverages such as regular soda, sweet tea, or fruit juice.   Pay attention to hunger and fullness cues.  Stop eating once you feel satisfied; don't wait until you feel full, stuffed, or sick from eating.  Choose lean meats and low fat/fat free dairy products.  Choose foods high in fiber such as fruits, vegetables, and whole grains (brown rice, whole wheat pasta, whole wheat bread, etc.).  Limit foods with added sugar to <7 gm per serving.  Always eat in the kitchen/dining room.  Never eat in the bedroom or in front of the TV.    Orders Placed This Encounter  Procedures  . CBC with Differential/Platelet  . Comprehensive metabolic panel    Order Specific Question:   Has the patient fasted?    Answer:   No  . Lipid panel    Order Specific Question:   Has the patient fasted?    Answer:   No  . PSA  . POCT urinalysis dipstick   No orders of the defined types were placed in this encounter.   Return in about 6 months (around 06/02/2018) for follow-up chronic medical conditions.   Kumar Falwell Elayne Guerin, M.D. Primary Care at [facility] previously [facility] [address] [place], [place]  [address] [phone] phone [phone] fax

## 2017-11-30 NOTE — Patient Instructions (Addendum)
   IF you received an x-ray today, you will receive an invoice from [facility]. Please contact [facility] at [phone] with questions or concerns regarding your invoice.   IF you received labwork today, you will receive an invoice from [facility]. Please contact [facility] at [phone] with questions or concerns regarding your invoice.   Our billing staff will not be able to assist you with questions regarding bills from these companies.  You will be contacted with the lab results as soon as they are available. The fastest way to get your results is to activate your My Chart account. Instructions are located on the last page of this paperwork. If you have not heard from us regarding the results in 2 weeks, please contact this office.      Preventive Care 72 Years and Older, Male Preventive care refers to lifestyle choices and visits with your health care provider that can promote health and wellness. What does preventive care include?  A yearly physical exam. This is also called an annual well check.  Dental exams once or twice a year.  Routine eye exams. Ask your health care provider how often you should have your eyes checked.  Personal lifestyle choices, including: ? Daily care of your teeth and gums. ? Regular physical activity. ? Eating a healthy diet. ? Avoiding tobacco and drug use. ? Limiting alcohol use. ? Practicing safe sex. ? Taking low doses of aspirin every day. ? Taking vitamin and mineral supplements as recommended by your health care provider. What happens during an annual well check? The services and screenings done by your health care provider during your annual well check will depend on your age, overall health, lifestyle risk factors, and family history of disease. Counseling Your health care provider may ask you questions about your:  Alcohol use.  Tobacco use.  Drug use.  Emotional well-being.  Home and relationship  well-being.  Sexual activity.  Eating habits.  History of falls.  Memory and ability to understand (cognition).  Work and work environment.  Screening You may have the following tests or measurements:  Height, weight, and BMI.  Blood pressure.  Lipid and cholesterol levels. These may be checked every 5 years, or more frequently if you are over 50 years old.  Skin check.  Lung cancer screening. You may have this screening every year starting at age 55 if you have a 30-pack-year history of smoking and currently smoke or have quit within the past 15 years.  Fecal occult blood test (FOBT) of the stool. You may have this test every year starting at age 50.  Flexible sigmoidoscopy or colonoscopy. You may have a sigmoidoscopy every 5 years or a colonoscopy every 10 years starting at age 50.  Prostate cancer screening. Recommendations will vary depending on your family history and other risks.  Hepatitis C blood test.  Hepatitis B blood test.  Sexually transmitted disease (STD) testing.  Diabetes screening. This is done by checking your blood sugar (glucose) after you have not eaten for a while (fasting). You may have this done every 1-3 years.  Abdominal aortic aneurysm (AAA) screening. You may need this if you are a current or former smoker.  Osteoporosis. You may be screened starting at age 70 if you are at high risk.  Talk with your health care provider about your test results, treatment options, and if necessary, the need for more tests. Vaccines Your health care provider may recommend certain vaccines, such as:  Influenza vaccine. This   is recommended every year.  Tetanus, diphtheria, and acellular pertussis (Tdap, Td) vaccine. You may need a Td booster every 10 years.  Varicella vaccine. You may need this if you have not been vaccinated.  Zoster vaccine. You may need this after age 60.  Measles, mumps, and rubella (MMR) vaccine. You may need at least one dose of  MMR if you were born in 1957 or later. You may also need a second dose.  Pneumococcal 13-valent conjugate (PCV13) vaccine. One dose is recommended after age 65.  Pneumococcal polysaccharide (PPSV23) vaccine. One dose is recommended after age 65.  Meningococcal vaccine. You may need this if you have certain conditions.  Hepatitis A vaccine. You may need this if you have certain conditions or if you travel or work in places where you may be exposed to hepatitis A.  Hepatitis B vaccine. You may need this if you have certain conditions or if you travel or work in places where you may be exposed to hepatitis B.  Haemophilus influenzae type b (Hib) vaccine. You may need this if you have certain risk factors.  Talk to your health care provider about which screenings and vaccines you need and how often you need them. This information is not intended to replace advice given to you by your health care provider. Make sure you discuss any questions you have with your health care provider. Document Released: 09/21/2015 Document Revised: 05/14/2016 Document Reviewed: 06/26/2015 Elsevier Interactive Patient Education  2018 [employer]  

## 2017-12-01 LAB — CBC WITH DIFFERENTIAL/PLATELET
Basophils Absolute: 0.1 10*3/uL (ref 0.0–0.2)
Basos: 1 %
EOS (ABSOLUTE): 0.3 10*3/uL (ref 0.0–0.4)
Eos: 3 %
Hematocrit: 45.8 % (ref 37.5–51.0)
Hemoglobin: 15.9 g/dL (ref 13.0–17.7)
Immature Grans (Abs): 0 10*3/uL (ref 0.0–0.1)
Immature Granulocytes: 0 %
Lymphocytes Absolute: 2 10*3/uL (ref 0.7–3.1)
Lymphs: 24 %
MCH: 31.2 pg (ref 26.6–33.0)
MCHC: 34.7 g/dL (ref 31.5–35.7)
MCV: 90 fL (ref 79–97)
Monocytes Absolute: 0.8 10*3/uL (ref 0.1–0.9)
Monocytes: 9 %
Neutrophils Absolute: 5.3 10*3/uL (ref 1.4–7.0)
Neutrophils: 63 %
Platelets: 341 10*3/uL (ref 150–379)
RBC: 5.1 x10E6/uL (ref 4.14–5.80)
RDW: 13.7 % (ref 12.3–15.4)
WBC: 8.4 10*3/uL (ref 3.4–10.8)

## 2017-12-01 LAB — LIPID PANEL
Chol/HDL Ratio: 2.3 ratio (ref 0.0–5.0)
Cholesterol, Total: 122 mg/dL (ref 100–199)
HDL: 52 mg/dL (ref >39)
LDL Calculated: 52 mg/dL (ref 0–99)
Triglycerides: 89 mg/dL (ref 0–149)
VLDL Cholesterol Cal: 18 mg/dL (ref 5–40)

## 2017-12-01 LAB — COMPREHENSIVE METABOLIC PANEL
ALT: 122 IU/L — ABNORMAL HIGH (ref 0–44)
AST: 113 IU/L — ABNORMAL HIGH (ref 0–40)
Albumin/Globulin Ratio: 1.6 (ref 1.2–2.2)
Albumin: 4.6 g/dL (ref 3.5–4.8)
Alkaline Phosphatase: 82 IU/L (ref 39–117)
BUN/Creatinine Ratio: 16 (ref 10–24)
BUN: 17 mg/dL (ref 8–27)
Bilirubin Total: 0.5 mg/dL (ref 0.0–1.2)
CO2: 22 mmol/L (ref 20–29)
Calcium: 9.7 mg/dL (ref 8.6–10.2)
Chloride: 100 mmol/L (ref 96–106)
Creatinine, Ser: 1.09 mg/dL (ref 0.76–1.27)
GFR calc Af Amer: 79 mL/min/{1.73_m2} (ref >59)
GFR calc non Af Amer: 68 mL/min/{1.73_m2} (ref >59)
Globulin, Total: 2.8 g/dL (ref 1.5–4.5)
Glucose: 90 mg/dL (ref 65–99)
Potassium: 4.2 mmol/L (ref 3.5–5.2)
Sodium: 142 mmol/L (ref 134–144)
Total Protein: 7.4 g/dL (ref 6.0–8.5)

## 2017-12-01 LAB — PSA: Prostate Specific Ag, Serum: 0.8 ng/mL (ref 0.0–4.0)

## 2017-12-03 MED ORDER — CHLORHEXIDINE GLUCONATE CLOTH 2 % EX PADS: 6.0000 | MEDICATED_PAD | Freq: Once | CUTANEOUS | Status: DC

## 2017-12-03 MED ORDER — CEFAZOLIN SODIUM-DEXTROSE 2-4 GM/100ML-% IV SOLN: 2.0000 g | INTRAVENOUS | Status: DC

## 2017-12-03 NOTE — Anesthesia Preprocedure Evaluation (Addendum)
Anesthesia Evaluation  Patient identified by MRN, date of birth, ID band Patient awake    Reviewed: Allergy & Precautions, NPO status , Patient's Chart, lab work & pertinent test results  History of Anesthesia Complications (+) PONV and history of anesthetic complications  Airway Mallampati: II  TM Distance: >3 FB Neck ROM: Full    Dental  (+) Teeth Intact, Dental Advisory Given, Chipped   Pulmonary former smoker,    Pulmonary exam normal breath sounds clear to auscultation       Cardiovascular hypertension, Pt. on medications Normal cardiovascular exam Rhythm:Regular Rate:Normal     Neuro/Psych Tremor negative psych ROS   GI/Hepatic Neg liver ROS, GERD  ,  Endo/Other  negative endocrine ROS  Renal/GU negative Renal ROS     Musculoskeletal negative musculoskeletal ROS (+)   Abdominal   Peds  Hematology negative hematology ROS (+)   Anesthesia Other Findings Day of surgery medications reviewed with the patient.  Reproductive/Obstetrics                            Anesthesia Physical Anesthesia Plan  ASA: III  Anesthesia Plan: MAC   Post-op Pain Management:    Induction: Intravenous  PONV Risk Score and Plan: 2 and Treatment may vary due to age or medical condition  Airway Management Planned: Nasal Cannula  Additional Equipment:   Intra-op Plan:   Post-operative Plan:   Informed Consent: I have reviewed the patients History and Physical, chart, labs and discussed the procedure including the risks, benefits and alternatives for the proposed anesthesia with the patient or authorized representative who has indicated his/her understanding and acceptance.   Dental advisory given  Plan Discussed with: CRNA  Anesthesia Plan Comments: (Precedex OK if needed, will coordinate with surgeon.)       Anesthesia Quick Evaluation

## 2017-12-03 NOTE — Progress Notes (Addendum)
Mr Lotz had labs drawn at his PCPs on 11/30/17 - his annual physical.  AST was 113, ALT 122.  I called Dr Thompson Caul office - wait time of 5 minutes.  His called Lorriane Shire at Dr Melven Sartorius office and informed her of the levels.  After not speaking to PCP or hearing from Dr Vertell Limber, I notified Dr Ermalene Postin, he said, that since we can not speak with physician's that he would have to be evaluated in am.  I called Dr Vertell Limber on his cell phone and asked if he has the information, Dr Vertell Limber said that he has it  and that he does not plan to use general anesthesia and it should be fine.  I did not reach patient at any of his numbers. I was unable to reach patient by phone.  I left  A message on voice mail.  I instructed the patient to arrive at [place] entrance at 0530 , nothing to eat or drink after midnight.   I instructed the patient to take the following medications in the am with just enough water to get them down: Vicodin if needed- may use Flonase and eye drops. I asked patient to not wear any lotions, powders, cologne, jewelry, piercing, make-up or nail polish.  I asked the patient to call [phone], in the am if there were any questions or problems.

## 2017-12-04 ENCOUNTER — Inpatient Hospital Stay (HOSPITAL_COMMUNITY)
Admission: RE | Admit: 2017-12-04 | Discharge: 2017-12-05 | DRG: 027 | Disposition: A | Discharge status: Home / Self Care | Payer: Medicare Other | Source: Ambulatory Visit | Attending: Neurosurgery | Admitting: Neurosurgery

## 2017-12-04 ENCOUNTER — Inpatient Hospital Stay (HOSPITAL_COMMUNITY)
Admission: RE | Disposition: A | Discharge status: Home / Self Care | Payer: Self-pay | Source: Ambulatory Visit | Attending: Neurosurgery

## 2017-12-04 ENCOUNTER — Other Ambulatory Visit: Payer: Self-pay

## 2017-12-04 ENCOUNTER — Encounter (HOSPITAL_COMMUNITY): Payer: Self-pay | Admitting: Certified Registered"

## 2017-12-04 ENCOUNTER — Inpatient Hospital Stay (HOSPITAL_COMMUNITY): Payer: Medicare Other | Admitting: Certified Registered Nurse Anesthetist

## 2017-12-04 ENCOUNTER — Inpatient Hospital Stay (HOSPITAL_COMMUNITY): Payer: Medicare Other

## 2017-12-04 DIAGNOSIS — Z79899 Other long term (current) drug therapy: Secondary | ICD-10-CM | POA: Diagnosis not present

## 2017-12-04 DIAGNOSIS — Z969 Presence of functional implant, unspecified: Secondary | ICD-10-CM | POA: Diagnosis not present

## 2017-12-04 DIAGNOSIS — I10 Essential (primary) hypertension: Secondary | ICD-10-CM | POA: Diagnosis present

## 2017-12-04 DIAGNOSIS — G250 Essential tremor: Secondary | ICD-10-CM | POA: Diagnosis not present

## 2017-12-04 DIAGNOSIS — K219 Gastro-esophageal reflux disease without esophagitis: Secondary | ICD-10-CM | POA: Diagnosis present

## 2017-12-04 DIAGNOSIS — R251 Tremor, unspecified: Secondary | ICD-10-CM | POA: Diagnosis present

## 2017-12-04 DIAGNOSIS — Z7982 Long term (current) use of aspirin: Secondary | ICD-10-CM | POA: Diagnosis not present

## 2017-12-04 DIAGNOSIS — K4090 Unilateral inguinal hernia, without obstruction or gangrene, not specified as recurrent: Secondary | ICD-10-CM | POA: Diagnosis not present

## 2017-12-04 DIAGNOSIS — G060 Intracranial abscess and granuloma: Secondary | ICD-10-CM | POA: Diagnosis not present

## 2017-12-04 HISTORY — PX: SUBTHALAMIC STIMULATOR INSERTION: SHX5375

## 2017-12-04 LAB — BASIC METABOLIC PANEL
Anion gap: 11 (ref 5–15)
BUN: 13 mg/dL (ref 6–20)
CO2: 23 mmol/L (ref 22–32)
Calcium: 9.1 mg/dL (ref 8.9–10.3)
Chloride: 104 mmol/L (ref 101–111)
Creatinine, Ser: 1 mg/dL (ref 0.61–1.24)
GFR calc Af Amer: >60 mL/min (ref >60)
GFR calc non Af Amer: >60 mL/min (ref >60)
Glucose, Bld: 108 mg/dL — ABNORMAL HIGH (ref 65–99)
Potassium: 3.6 mmol/L (ref 3.5–5.1)
Sodium: 138 mmol/L (ref 135–145)

## 2017-12-04 LAB — CBC
HCT: 42.5 % (ref 39.0–52.0)
Hemoglobin: 14.6 g/dL (ref 13.0–17.0)
MCH: 31.1 pg (ref 26.0–34.0)
MCHC: 34.4 g/dL (ref 30.0–36.0)
MCV: 90.6 fL (ref 78.0–100.0)
Platelets: 285 10*3/uL (ref 150–400)
RBC: 4.69 MIL/uL (ref 4.22–5.81)
RDW: 13.1 % (ref 11.5–15.5)
WBC: 9 10*3/uL (ref 4.0–10.5)

## 2017-12-04 LAB — SURGICAL PCR SCREEN
MRSA, PCR: NEGATIVE
Staphylococcus aureus: NEGATIVE

## 2017-12-04 SURGERY — SUBTHALAMIC STIMULATOR INSERTION
Anesthesia: Monitor Anesthesia Care | Site: Head | Laterality: Left

## 2017-12-04 MED ORDER — CEPHALEXIN 500 MG PO CAPS: 500.0000 mg | ORAL_CAPSULE | Freq: Three times a day (TID) | ORAL | Status: DC

## 2017-12-04 MED ORDER — DEXMEDETOMIDINE HCL IN NACL 200 MCG/50ML IV SOLN
INTRAVENOUS | Status: DC | PRN
  Administered 2017-12-04: .1 ug/kg/h via INTRAVENOUS

## 2017-12-04 MED ORDER — TETRAHYDROZ-GLYC-HYPROM-PEG 0.05-0.2-0.36-1 % OP SOLN: Freq: Every day | OPHTHALMIC | Status: DC | PRN

## 2017-12-04 MED ORDER — SODIUM BICARBONATE 4 % IV SOLN
INTRAVENOUS | Status: DC | PRN
  Administered 2017-12-04: 3 mL via INTRAVENOUS

## 2017-12-04 MED ORDER — LIDOCAINE-EPINEPHRINE 1 %-1:100000 IJ SOLN
INTRAMUSCULAR | Status: AC
  Filled 2017-12-04: qty 2

## 2017-12-04 MED ORDER — THROMBIN (RECOMBINANT) 5000 UNITS EX SOLR
OROMUCOSAL | Status: DC | PRN
  Administered 2017-12-04: 09:00:00 via TOPICAL

## 2017-12-04 MED ORDER — ONDANSETRON HCL 4 MG/2ML IJ SOLN: 4.0000 mg | Freq: Once | INTRAMUSCULAR | Status: DC | PRN

## 2017-12-04 MED ORDER — ONDANSETRON HCL 4 MG/2ML IJ SOLN
INTRAMUSCULAR | Status: DC | PRN
  Administered 2017-12-04: 4 mg via INTRAVENOUS

## 2017-12-04 MED ORDER — THROMBIN 5000 UNITS EX SOLR
CUTANEOUS | Status: AC
  Filled 2017-12-04: qty 5000

## 2017-12-04 MED ORDER — LIDOCAINE-EPINEPHRINE 1 %-1:100000 IJ SOLN
INTRAMUSCULAR | Status: DC | PRN
  Administered 2017-12-04: 13.5 mL

## 2017-12-04 MED ORDER — BUPIVACAINE HCL (PF) 0.5 % IJ SOLN
INTRAMUSCULAR | Status: AC
  Filled 2017-12-04: qty 30

## 2017-12-04 MED ORDER — ONDANSETRON HCL 4 MG/2ML IJ SOLN: 4.0000 mg | Freq: Four times a day (QID) | INTRAMUSCULAR | Status: DC | PRN

## 2017-12-04 MED ORDER — OXYCODONE HCL 5 MG PO TABS: 10.0000 mg | ORAL_TABLET | ORAL | Status: DC | PRN

## 2017-12-04 MED ORDER — ACETAMINOPHEN 325 MG PO TABS
650.0000 mg | ORAL_TABLET | ORAL | Status: DC | PRN
Start: 2017-12-04 — End: 2017-12-05

## 2017-12-04 MED ORDER — MORPHINE SULFATE (PF) 2 MG/ML IV SOLN: 2.0000 mg | INTRAVENOUS | Status: DC | PRN

## 2017-12-04 MED ORDER — POLYETHYLENE GLYCOL 3350 17 G PO PACK: 17.0000 g | PACK | Freq: Every day | ORAL | Status: DC | PRN

## 2017-12-04 MED ORDER — FENTANYL CITRATE (PF) 100 MCG/2ML IJ SOLN: 25.0000 ug | INTRAMUSCULAR | Status: DC | PRN

## 2017-12-04 MED ORDER — BACITRACIN ZINC 500 UNIT/GM EX OINT
TOPICAL_OINTMENT | CUTANEOUS | Status: DC | PRN
  Administered 2017-12-04: 1 via TOPICAL

## 2017-12-04 MED ORDER — CEPHALEXIN 500 MG PO CAPS
500.0000 mg | ORAL_CAPSULE | Freq: Three times a day (TID) | ORAL | Status: DC
  Filled 2017-12-04: qty 1

## 2017-12-04 MED ORDER — ASPIRIN 325 MG PO TABS
325.0000 mg | ORAL_TABLET | Freq: Every day | ORAL | Status: DC
  Administered 2017-12-05: 325 mg via ORAL
  Filled 2017-12-04 (×2): qty 1

## 2017-12-04 MED ORDER — DOCUSATE SODIUM 100 MG PO CAPS
100.0000 mg | ORAL_CAPSULE | Freq: Two times a day (BID) | ORAL | Status: DC
Start: 2017-12-04 — End: 2017-12-05
  Administered 2017-12-04 – 2017-12-05 (×2): 100 mg via ORAL
  Filled 2017-12-04 (×2): qty 1

## 2017-12-04 MED ORDER — HEMOSTATIC AGENTS (NO CHARGE) OPTIME
TOPICAL | Status: DC | PRN
  Administered 2017-12-04 (×2): 1 via TOPICAL

## 2017-12-04 MED ORDER — PHENOL 1.4 % MT LIQD: 1.0000 | OROMUCOSAL | Status: DC | PRN

## 2017-12-04 MED ORDER — BISACODYL 10 MG RE SUPP: 10.0000 mg | Freq: Every day | RECTAL | Status: DC | PRN

## 2017-12-04 MED ORDER — SODIUM BICARBONATE 4 % IV SOLN
INTRAVENOUS | Status: AC
  Filled 2017-12-04: qty 5

## 2017-12-04 MED ORDER — LACTATED RINGERS IV SOLN
INTRAVENOUS | Status: DC | PRN
  Administered 2017-12-04: 07:00:00 via INTRAVENOUS

## 2017-12-04 MED ORDER — FLUTICASONE PROPIONATE 50 MCG/ACT NA SUSP
1.0000 | Freq: Every day | NASAL | Status: DC | PRN
  Filled 2017-12-04: qty 16

## 2017-12-04 MED ORDER — KCL IN DEXTROSE-NACL 20-5-0.45 MEQ/L-%-% IV SOLN: INTRAVENOUS | Status: DC

## 2017-12-04 MED ORDER — LIDOCAINE-EPINEPHRINE 1 %-1:100000 IJ SOLN
INTRAMUSCULAR | Status: AC
  Filled 2017-12-04: qty 3

## 2017-12-04 MED ORDER — BACITRACIN ZINC 500 UNIT/GM EX OINT
TOPICAL_OINTMENT | CUTANEOUS | Status: AC
  Filled 2017-12-04: qty 28.35

## 2017-12-04 MED ORDER — MORPHINE SULFATE (PF) 4 MG/ML IV SOLN: 2.0000 mg | INTRAVENOUS | Status: DC | PRN

## 2017-12-04 MED ORDER — HYDROCODONE-ACETAMINOPHEN 5-325 MG PO TABS
1.0000 | ORAL_TABLET | Freq: Four times a day (QID) | ORAL | Status: DC | PRN
Start: 2017-12-04 — End: 2017-12-05
  Administered 2017-12-04 (×2): 1 via ORAL
  Filled 2017-12-04 (×2): qty 1

## 2017-12-04 MED ORDER — MENTHOL 3 MG MT LOZG: 1.0000 | LOZENGE | OROMUCOSAL | Status: DC | PRN

## 2017-12-04 MED ORDER — ZOLPIDEM TARTRATE 5 MG PO TABS: 5.0000 mg | ORAL_TABLET | Freq: Every evening | ORAL | Status: DC | PRN

## 2017-12-04 MED ORDER — SODIUM CHLORIDE 0.9% FLUSH
3.0000 mL | Freq: Two times a day (BID) | INTRAVENOUS | Status: DC
  Administered 2017-12-04 (×2): 3 mL via INTRAVENOUS

## 2017-12-04 MED ORDER — HYDROCHLOROTHIAZIDE 25 MG PO TABS
25.0000 mg | ORAL_TABLET | Freq: Every day | ORAL | Status: DC
  Administered 2017-12-04 – 2017-12-05 (×2): 25 mg via ORAL
  Filled 2017-12-04 (×2): qty 1

## 2017-12-04 MED ORDER — ALUM & MAG HYDROXIDE-SIMETH 200-200-20 MG/5ML PO SUSP: 30.0000 mL | Freq: Four times a day (QID) | ORAL | Status: DC | PRN

## 2017-12-04 MED ORDER — VITAMIN B-12 1000 MCG PO TABS
3000.0000 ug | ORAL_TABLET | Freq: Every day | ORAL | Status: DC
Start: 2017-12-04 — End: 2017-12-05
  Filled 2017-12-04 (×2): qty 3

## 2017-12-04 MED ORDER — VANCOMYCIN HCL 1000 MG IV SOLR
INTRAVENOUS | Status: DC | PRN
  Administered 2017-12-04: 1000 mg via INTRAVENOUS

## 2017-12-04 MED ORDER — BUPIVACAINE HCL 0.5 % IJ SOLN
INTRAMUSCULAR | Status: DC | PRN
  Administered 2017-12-04: 13.5 mL

## 2017-12-04 MED ORDER — TETRAHYDROZOLINE HCL 0.05 % OP SOLN
1.0000 [drp] | Freq: Every day | OPHTHALMIC | Status: DC | PRN
  Filled 2017-12-04: qty 15

## 2017-12-04 MED ORDER — ACETAMINOPHEN 650 MG RE SUPP: 650.0000 mg | RECTAL | Status: DC | PRN

## 2017-12-04 MED ORDER — CEFAZOLIN SODIUM-DEXTROSE 2-4 GM/100ML-% IV SOLN
INTRAVENOUS | Status: AC
  Filled 2017-12-04: qty 100

## 2017-12-04 MED ORDER — OXYCODONE HCL 5 MG PO TABS: 5.0000 mg | ORAL_TABLET | ORAL | Status: DC | PRN

## 2017-12-04 MED ORDER — ONDANSETRON HCL 4 MG PO TABS: 4.0000 mg | ORAL_TABLET | Freq: Four times a day (QID) | ORAL | Status: DC | PRN

## 2017-12-04 MED ORDER — VITAMIN C 500 MG PO TABS
1000.0000 mg | ORAL_TABLET | Freq: Every day | ORAL | Status: DC
  Administered 2017-12-05: 1000 mg via ORAL
  Filled 2017-12-04: qty 2

## 2017-12-04 MED ORDER — VITAMIN D3 25 MCG (1000 UNIT) PO TABS
2000.0000 [IU] | ORAL_TABLET | Freq: Every day | ORAL | Status: DC
  Filled 2017-12-04 (×2): qty 2

## 2017-12-04 MED ORDER — SODIUM CHLORIDE 0.9% FLUSH: 3.0000 mL | INTRAVENOUS | Status: DC | PRN

## 2017-12-04 MED ORDER — 0.9 % SODIUM CHLORIDE (POUR BTL) OPTIME
TOPICAL | Status: DC | PRN
  Administered 2017-12-04: 1000 mL

## 2017-12-04 MED ORDER — DEXMEDETOMIDINE HCL 200 MCG/2ML IV SOLN
INTRAVENOUS | Status: DC | PRN
  Administered 2017-12-04: 4 ug via INTRAVENOUS
  Administered 2017-12-04 (×2): 8 ug via INTRAVENOUS

## 2017-12-04 MED ORDER — FENTANYL CITRATE (PF) 250 MCG/5ML IJ SOLN
INTRAMUSCULAR | Status: AC
  Filled 2017-12-04: qty 5

## 2017-12-04 MED ORDER — CEFAZOLIN SODIUM-DEXTROSE 2-3 GM-%(50ML) IV SOLR
INTRAVENOUS | Status: DC | PRN
  Administered 2017-12-04: 2 g via INTRAVENOUS

## 2017-12-04 MED ORDER — LOSARTAN POTASSIUM 50 MG PO TABS
50.0000 mg | ORAL_TABLET | Freq: Every day | ORAL | Status: DC
  Administered 2017-12-04 – 2017-12-05 (×2): 50 mg via ORAL
  Filled 2017-12-04 (×2): qty 1

## 2017-12-04 MED ORDER — FENTANYL CITRATE (PF) 100 MCG/2ML IJ SOLN
INTRAMUSCULAR | Status: DC | PRN
  Administered 2017-12-04 (×2): 50 ug via INTRAVENOUS

## 2017-12-04 MED ORDER — VANCOMYCIN HCL IN DEXTROSE 1-5 GM/200ML-% IV SOLN
INTRAVENOUS | Status: AC
  Filled 2017-12-04: qty 200

## 2017-12-04 MED ORDER — CEFAZOLIN SODIUM-DEXTROSE 2-4 GM/100ML-% IV SOLN
2.0000 g | Freq: Three times a day (TID) | INTRAVENOUS | Status: AC
  Administered 2017-12-04 – 2017-12-05 (×2): 2 g via INTRAVENOUS
  Filled 2017-12-04 (×2): qty 100

## 2017-12-04 SURGICAL SUPPLY — 64 items
BIT DRILL NEURO 2X3.1 SFT TUCH (MISCELLANEOUS) IMPLANT
BLADE CLIPPER SURG (BLADE) ×3 IMPLANT
BLADE SURG 11 STRL SS (BLADE) ×3 IMPLANT
CANISTER SUCT 3000ML PPV (MISCELLANEOUS) ×3 IMPLANT
CARTRIDGE OIL MAESTRO DRILL (MISCELLANEOUS) ×1 IMPLANT
DECANTER SPIKE VIAL GLASS SM (MISCELLANEOUS) ×12 IMPLANT
DIFFUSER DRILL AIR PNEUMATIC (MISCELLANEOUS) ×3 IMPLANT
DRAPE POUCH INSTRU U-SHP 10X18 (DRAPES) ×3 IMPLANT
DRAPE STERI IOBAN 125X83 (DRAPES) ×3 IMPLANT
DRILL NEURO 2X3.1 SOFT TOUCH (MISCELLANEOUS)
DRSG OPSITE 4X5.5 SM (GAUZE/BANDAGES/DRESSINGS) ×3 IMPLANT
DRSG TEGADERM 2-3/8X2-3/4 SM (GAUZE/BANDAGES/DRESSINGS) ×9 IMPLANT
DURAPREP 26ML APPLICATOR (WOUND CARE) ×3 IMPLANT
DURASEAL APPLICATOR TIP (TIP) ×6 IMPLANT
DURASEAL SPINE SEALANT 3ML (MISCELLANEOUS) ×6 IMPLANT
GAUZE SPONGE 4X4 12PLY STRL (GAUZE/BANDAGES/DRESSINGS) IMPLANT
GAUZE SPONGE 4X4 16PLY XRAY LF (GAUZE/BANDAGES/DRESSINGS) IMPLANT
GLOVE BIO SURGEON STRL SZ8 (GLOVE) ×9 IMPLANT
GLOVE BIOGEL PI IND STRL 7.5 (GLOVE) ×1 IMPLANT
GLOVE BIOGEL PI IND STRL 8 (GLOVE) ×4 IMPLANT
GLOVE BIOGEL PI IND STRL 8.5 (GLOVE) ×2 IMPLANT
GLOVE BIOGEL PI INDICATOR 7.5 (GLOVE) ×2
GLOVE BIOGEL PI INDICATOR 8 (GLOVE) ×8
GLOVE BIOGEL PI INDICATOR 8.5 (GLOVE) ×4
GLOVE ECLIPSE 7.5 STRL STRAW (GLOVE) ×12 IMPLANT
GLOVE ECLIPSE 8.0 STRL XLNG CF (GLOVE) ×6 IMPLANT
GLOVE EXAM NITRILE LRG STRL (GLOVE) IMPLANT
GLOVE EXAM NITRILE XL STR (GLOVE) IMPLANT
GLOVE EXAM NITRILE XS STR PU (GLOVE) IMPLANT
GOWN STRL REUS W/ TWL LRG LVL3 (GOWN DISPOSABLE) IMPLANT
GOWN STRL REUS W/ TWL XL LVL3 (GOWN DISPOSABLE) ×1 IMPLANT
GOWN STRL REUS W/TWL 2XL LVL3 (GOWN DISPOSABLE) ×3 IMPLANT
GOWN STRL REUS W/TWL LRG LVL3 (GOWN DISPOSABLE)
GOWN STRL REUS W/TWL XL LVL3 (GOWN DISPOSABLE) ×2
HEMOSTAT POWDER KIT SURGIFOAM (HEMOSTASIS) ×3 IMPLANT
KIT ACCESSORY (KITS) ×2
KIT ACCESSORY NEUROSURG SU (KITS) ×1 IMPLANT
KIT BASIN OR (CUSTOM PROCEDURE TRAY) ×3 IMPLANT
KIT HEADREST PASSIVE (NEUROSURGERY SUPPLIES) ×3 IMPLANT
KIT PLATFORM UNI 4LEG STARFIX (KITS) ×3 IMPLANT
KIT TURNOVER KIT B (KITS) ×3 IMPLANT
LEAD DBS (Neuro Prosthesis/Implant) ×3 IMPLANT
MARKER SKIN DUAL TIP RULER LAB (MISCELLANEOUS) ×6 IMPLANT
NEEDLE HYPO 25X1 1.5 SAFETY (NEEDLE) ×6 IMPLANT
NEEDLE SPNL 18GX3.5 QUINCKE PK (NEEDLE) IMPLANT
NEEDLE SPNL 22GX3.5 QUINCKE BK (NEEDLE) IMPLANT
NS IRRIG 1000ML POUR BTL (IV SOLUTION) ×3 IMPLANT
OIL CARTRIDGE MAESTRO DRILL (MISCELLANEOUS) ×3
PACK LAMINECTOMY NEURO (CUSTOM PROCEDURE TRAY) ×3 IMPLANT
PAD ARMBOARD 7.5X6 YLW CONV (MISCELLANEOUS) ×6 IMPLANT
PERFORATOR LRG  14-11MM (BIT) ×2
PERFORATOR LRG 14-11MM (BIT) ×1 IMPLANT
PLATEFORM ARRAY DZAP LEADPOINT (MISCELLANEOUS) ×4
PLATFORM ARRAY DZAP LEADPOINT (MISCELLANEOUS) ×2 IMPLANT
PLATFORM BILATERAL KIT (MISCELLANEOUS) IMPLANT
SET SINGLE IT STRL (KITS) ×3 IMPLANT
SPONGE SURGIFOAM ABS GEL SZ50 (HEMOSTASIS) IMPLANT
STAPLER SKIN PROX WIDE 3.9 (STAPLE) IMPLANT
SUT ETHILON 3 0 PS 1 (SUTURE) IMPLANT
SUT SILK 2 0 TIES 10X30 (SUTURE) ×3 IMPLANT
SUT VIC AB 2-0 CP2 18 (SUTURE) ×3 IMPLANT
TOWEL GREEN STERILE (TOWEL DISPOSABLE) ×3 IMPLANT
TOWEL GREEN STERILE FF (TOWEL DISPOSABLE) ×3 IMPLANT
WATER STERILE IRR 1000ML POUR (IV SOLUTION) ×3 IMPLANT

## 2017-12-04 NOTE — Op Note (Signed)
12/04/2017  11:06 AM  PATIENT:  Carlos Hunt  72 y.o. male  PRE-OPERATIVE DIAGNOSIS:  Essential tremor  POST-OPERATIVE DIAGNOSIS:  Essential tremor  PROCEDURE:  Procedure(s) with comments: Left Deep Brain Stimulator Placement (Left) - Left deep brain stimulator placement with microelectrode recordings using Starfix.  SURGEON:  Surgeon(s) and Role:    * Erline Levine, MD - Primary  PHYSICIAN ASSISTANT:   ASSISTANTS: Poteat, RN   ANESTHESIA:   local and IV sedation  EBL:  Minimal    BLOOD ADMINISTERED:none  DRAINS: none   LOCAL MEDICATIONS USED:  LIDOCAINE   SPECIMEN:  No Specimen  DISPOSITION OF SPECIMEN:  N/A  COUNTS:  YES  TOURNIQUET:  * No tourniquets in log *  DICTATION: Indications: Patient is a 72 year old man with Essential Tremor it was elected to take him to surgery for left VIM Thalamus deep brain stimulator electrode placement  Procedure: Preoperative planing was performed with volumetricCT and placement of 4 fiducial markers in the skull followed by CT of the brain also obtained volumetrically. These were then exported to create Starfix head frame with planned targeting of VIM nucleus electrodes was performed. The patient was brought to the operating room and placed in a semi-Fowler's position with her neck and stabilized in a neck holder. This was affixed to the Mayfield adapter. Her scalp was then prepped with betadine scrub and DuraPrep and subsequently draped with an Ioban drape.with the fiducials and the skin was infiltrated with local lidocaine.  The areas of planned incision were then infiltrated with local lidocaine with epinephrine. The stepoffs were connected and the Starfix frame was assembled.  The entry points were then marked.  A curvilinear incision was made centered on the left entry point. An elevator was used to clear pericranium from the skull. The previously placed bur hole was reopened and cleared of bone.   The dura was coagulated with bipolar  electrocautery.  After opening the dura and a localizing the entry point the stylette and outer cannula were inserted into the brain. Duraseal was placed to prevent CSF leakage at the entry site. Microelectrode recordings were then performed and  we had very good recordings from the VIM. Subsequently the stimulating electrode was placed and the patient had significant improvement in tremor on the right side of the body but with speech difficulties, so we moved the electrode 2 mm medial with good tremor control and without speech side effects. The electrode was then locked into position with the stimlock cap. The redundant electrode was circularized and tunneled under the scalp. The electrode was tunneled to the left and then into the posterior scalp and locked into position. The wounds were then irrigated and closed with 2-0 Vicryl sutures and staples. The fiducials were removed and staples were placed over each of these sites. The head was washed and then sterile occlusive dressings were placed. The patient was taken to recovery having tolerated her procedure without difficulty or untoward effect. Please refer to detailed microelectrode recordings from Dr. Carles Collet for more specifics of the positioning of the electrodes. These are included in her Epic note from the detailed neural monitoring and physical exam assessment during the surgery.   PLAN OF CARE: Admit to inpatient   PATIENT DISPOSITION:  PACU - hemodynamically stable.   Delay start of Pharmacological VTE agent (>24hrs) due to surgical blood loss or risk of bleeding: yes

## 2017-12-04 NOTE — Transfer of Care (Signed)
Immediate Anesthesia Transfer of Care Note  Patient: Carlos Hunt  Procedure(s) Performed: Left Deep Brain Stimulator Placement (Left Head)  Patient Location: PACU  Anesthesia Type:MAC  Level of Consciousness: awake, alert , oriented and patient cooperative  Airway & Oxygen Therapy: Patient Spontanous Breathing and Patient connected to nasal cannula oxygen  Post-op Assessment: Report given to RN, Post -op Vital signs reviewed and stable and Patient moving all extremities X 4  Post vital signs: Reviewed and stable  Last Vitals:  Vitals Value Taken Time  BP 132/70 12/04/2017 10:30 AM  Temp    Pulse 50 12/04/2017 10:32 AM  Resp 12 12/04/2017 10:32 AM  SpO2 96 % 12/04/2017 10:32 AM  Vitals shown include unvalidated device data.  Last Pain:  Vitals:   12/04/17 0633  TempSrc:   PainSc: 2       Patients Stated Pain Goal: 5 (76/[id number])  Complications: No apparent anesthesia complications

## 2017-12-04 NOTE — Interval H&P Note (Signed)
History and Physical Interval Note:  12/04/2017 7:25 AM  Carlos Hunt  has presented today for surgery, with the diagnosis of Essential tremor  The various methods of treatment have been discussed with the patient and family. After consideration of risks, benefits and other options for treatment, the patient has consented to  Procedure(s) with comments: Left Deep brain stimulator placement (Left) - Left deep brain stimulator placement as a surgical intervention .  The patient's history has been reviewed, patient examined, no change in status, stable for surgery.  I have reviewed the patient's chart and labs.  Questions were answered to the patient's satisfaction.     [name]

## 2017-12-04 NOTE — Progress Notes (Signed)
Patient is awake, alert, conversant.  MAEW and doing well.

## 2017-12-04 NOTE — Brief Op Note (Signed)
12/04/2017  11:06 AM  PATIENT:  Carlos Hunt  72 y.o. male  PRE-OPERATIVE DIAGNOSIS:  Essential tremor  POST-OPERATIVE DIAGNOSIS:  Essential tremor  PROCEDURE:  Procedure(s) with comments: Left Deep Brain Stimulator Placement (Left) - Left deep brain stimulator placement with microelectrode recordings using Starfix.  SURGEON:  Surgeon(s) and Role:    * Erline Levine, MD - Primary  PHYSICIAN ASSISTANT:   ASSISTANTS: Poteat, RN   ANESTHESIA:   local and IV sedation  EBL:  Minimal    BLOOD ADMINISTERED:none  DRAINS: none   LOCAL MEDICATIONS USED:  LIDOCAINE   SPECIMEN:  No Specimen  DISPOSITION OF SPECIMEN:  N/A  COUNTS:  YES  TOURNIQUET:  * No tourniquets in log *  DICTATION: Indications: Patient is a 72 year old man with Essential Tremor it was elected to take him to surgery for left VIM Thalamus deep brain stimulator electrode placement  Procedure: Preoperative planing was performed with volumetricCT and placement of 4 fiducial markers in the skull followed by CT of the brain also obtained volumetrically. These were then exported to create Starfix head frame with planned targeting of VIM nucleus electrodes was performed. The patient was brought to the operating room and placed in a semi-Fowler's position with her neck and stabilized in a neck holder. This was affixed to the Mayfield adapter. Her scalp was then prepped with betadine scrub and DuraPrep and subsequently draped with an Ioban drape.with the fiducials and the skin was infiltrated with local lidocaine.  The areas of planned incision were then infiltrated with local lidocaine with epinephrine. The stepoffs were connected and the Starfix frame was assembled.  The entry points were then marked.  A curvilinear incision was made centered on the left entry point. An elevator was used to clear pericranium from the skull. The previously placed bur hole was reopened and cleared of bone.   The dura was coagulated with bipolar  electrocautery.  After opening the dura and a localizing the entry point the stylette and outer cannula were inserted into the brain. Duraseal was placed to prevent CSF leakage at the entry site. Microelectrode recordings were then performed and  we had very good recordings from the VIM. Subsequently the stimulating electrode was placed and the patient had significant improvement in tremor on the right side of the body but with speech difficulties, so we moved the electrode 2 mm medial with good tremor control and without speech side effects. The electrode was then locked into position with the stimlock cap. The redundant electrode was circularized and tunneled under the scalp. The electrode was tunneled to the left and then into the posterior scalp and locked into position. The wounds were then irrigated and closed with 2-0 Vicryl sutures and staples. The fiducials were removed and staples were placed over each of these sites. The head was washed and then sterile occlusive dressings were placed. The patient was taken to recovery having tolerated her procedure without difficulty or untoward effect. Please refer to detailed microelectrode recordings from Dr. Carles Collet for more specifics of the positioning of the electrodes. These are included in her Epic note from the detailed neural monitoring and physical exam assessment during the surgery.   PLAN OF CARE: Admit to inpatient   PATIENT DISPOSITION:  PACU - hemodynamically stable.   Delay start of Pharmacological VTE agent (>24hrs) due to surgical blood loss or risk of bleeding: yes

## 2017-12-04 NOTE — Evaluation (Signed)
Physical Therapy Evaluation and Discharge Patient Details Name: Carlos Hunt MRN: [id number] DOB: 09-07-1946 Today's Date: 12/04/2017   History of Present Illness  Pt is a 72 y/o male s/p L deep brain stimulator insertion. MRI showed no complications with stimulator insertion. PMH includes HTN, tremor, and s/p deep brain stimulator and removal.   Clinical Impression  Patient evaluated by Physical Therapy with no further acute PT needs identified. All education has been completed and the patient has no further questions. Pt overall steady with gait and stair navigation. Requiring min guard to supervision, only for safety, no external assist required. Reviewed precautions and safety with mobility at home. Pt's wife available to assist at d/c. See below for any follow-up Physical Therapy or equipment needs. PT is signing off. Thank you for this referral. If needs change, please reconsult.      Follow Up Recommendations No PT follow up;Supervision for mobility/OOB    Equipment Recommendations  None recommended by PT    Recommendations for Other Services       Precautions / Restrictions Precautions Precautions: None Restrictions Weight Bearing Restrictions: No      Mobility  Bed Mobility Overal bed mobility: Modified Independent                Transfers Overall transfer level: Needs assistance Equipment used: None Transfers: Sit to/from Stand Sit to Stand: Supervision         General transfer comment: Supervision for safety. No LOB noted.   Ambulation/Gait Ambulation/Gait assistance: Supervision Ambulation Distance (Feet): 250 Feet Assistive device: None Gait Pattern/deviations: WFL(Within Functional Limits) Gait velocity: WFL  Gait velocity interpretation: at or above normal speed for age/gender General Gait Details: Good gait speed. Overall steady with no LOB noted. Pt reports he feels close to baseline.   Stairs Stairs: Yes Stairs assistance: Min guard Stair  Management: No rails;Alternating pattern;Forwards;Step to pattern Number of Stairs: 10 General stair comments: Slow, cautious stair navigation. Used alternating technique for ascending and step to technique for descending steps. Min guard for safety, no physical assist required. Discussed safe stair navigation at home and pt deomonstrated understanding.   Wheelchair Mobility    Modified Rankin (Stroke Patients Only)       Balance Overall balance assessment: Needs assistance Sitting-balance support: No upper extremity supported;Feet supported Sitting balance-Leahy Scale: Normal     Standing balance support: No upper extremity supported;During functional activity Standing balance-Leahy Scale: Good                               Pertinent Vitals/Pain Pain Assessment: 0-10 Pain Score: 6  Pain Location: head  Pain Descriptors / Indicators: Aching;Operative site guarding Pain Intervention(s): Limited activity within patient's tolerance;Monitored during session;Repositioned    Home Living Family/patient expects to be discharged to:: Private residence Living Arrangements: Spouse/significant other Available Help at Discharge: Family;Available 24 hours/day Type of Home: House Home Access: Stairs to enter Entrance Stairs-Rails: None Entrance Stairs-Number of Steps: 2 Home Layout: Two level;Able to live on main level with bedroom/bathroom Home Equipment: None      Prior Function Level of Independence: Independent               Hand Dominance        Extremity/Trunk Assessment   Upper Extremity Assessment Upper Extremity Assessment: Defer to OT evaluation    Lower Extremity Assessment Lower Extremity Assessment: Overall WFL for tasks assessed    Cervical / Trunk Assessment Cervical /  Trunk Assessment: Normal  Communication   Communication: No difficulties  Cognition Arousal/Alertness: Awake/alert Behavior During Therapy: WFL for tasks  assessed/performed Overall Cognitive Status: Within Functional Limits for tasks assessed                                        General Comments General comments (skin integrity, edema, etc.): Pt's wife present during session.     Exercises     Assessment/Plan    PT Assessment Patent does not need any further PT services  PT Problem List         PT Treatment Interventions      PT Goals (Current goals can be found in the Care Plan section)  Acute Rehab PT Goals Patient Stated Goal: to go home  PT Goal Formulation: With patient Time For Goal Achievement: 12/04/17 Potential to Achieve Goals: Good    Frequency     Barriers to discharge        Co-evaluation               AM-PAC PT "6 Clicks" Daily Activity  Outcome Measure Difficulty turning over in bed (including adjusting bedclothes, sheets and blankets)?: None Difficulty moving from lying on back to sitting on the side of the bed? : None Difficulty sitting down on and standing up from a chair with arms (e.g., wheelchair, bedside commode, etc,.)?: None Help needed moving to and from a bed to chair (including a wheelchair)?: None Help needed walking in hospital room?: None Help needed climbing 3-5 steps with a railing? : A Little 6 Click Score: 23    End of Session Equipment Utilized During Treatment: Gait belt Activity Tolerance: Patient tolerated treatment well Patient left: in bed;with call bell/phone within reach;with family/visitor present Nurse Communication: Mobility status PT Visit Diagnosis: Other abnormalities of gait and mobility (R26.89);Pain Pain - part of body: (head )    Time: 4540-9811 PT Time Calculation (min) (ACUTE ONLY): 15 min   Charges:   PT Evaluation $PT Eval Low Complexity: 1 Low     PT G Codes:        [name], PT, DPT  Acute Rehabilitation Services  Pager: [phone]   [name] 12/04/2017, 4:50 PM

## 2017-12-04 NOTE — Op Note (Signed)
Preoperative diagnosis:  Essential Tremor Postoperative diagnosis:  Essential tremor Surgeon:  Erline Levine Neurologist:  Wells Guiles Deepak Bless  Indication for procedure: Determination of optimal electrode position for deep brain stimulation therapy. Complications: None   Description of procedure:  Following the incision and burr hole placement, a recording microelectrode was slowly advanced into the brain via a motorized drive.  Beginning approximately 10 mm above the target, recordings were periodically made, and the resultant brain activity was described on the neurophysiologic recording worksheet.  Relevant samples of the recordings were saved for subsequent off line analysis.  A total of one recording pass was obtained on the left side of the brain.    4 mm of VIM were recorded from the left side of the brain.  Following the recording, the recording electrode was replaced by a stimulating electrode by [name].  Test stimulations were then obtained.  Active contacts that were used and the response to stimulation, including both adverse effects and therapeutic effects, were recorded on the neurophysiologic stimulation worksheet.  In brief, there was resolution of tremor but there was also dysarthria.  It was reprogammed with several contacts and different pulse widths with the same result.  The decision was made to move the stimulating electrode 2 mm medial.  No further recording passes were made.  Test stimulations revealed control of tremor with no dysarthria.  Adverse effects only occurred at supratherapeutic voltages and consisted of fingertip paresthesias.  The electrode was then secured in place by [name].  Post-op MRI was ordered.  Total record time: 60 min  Alonza Bogus, DO Director of Movement Disorders

## 2017-12-05 NOTE — Discharge Summary (Signed)
Physician Discharge Summary  Patient ID: Carlos Hunt MRN: [id number] DOB/AGE: 1946/05/06 72 y.o.  Admit date: 12/04/2017 Discharge date: 12/05/2017  Admission Diagnoses: Tremors  Discharge Diagnoses: The same Active Problems:   Tremor   Discharged Condition: good  Hospital Course: [name] placed left deep brain stimulating electrodes and the patient on 12/04/2017.  The patient's postoperative course was unremarkable.  On postoperative day #1 he requested discharge to home.  He was given written and oral discharge instructions.  All his questions were answered.  Consults: None Significant Diagnostic Studies: None Treatments: Placement of left deep brain stimulating electrodes Discharge Exam: Blood pressure 138/70, pulse 61, temperature 98 F (36.7 C), resp. rate 16, height 5' 8"  (1.727 m), weight 90.7 kg (200 lb), SpO2 98 %. The patient is alert and pleasant.  He looks well.  His dressings are clean.  His speech and strength is normal.  Disposition: Home  Discharge Instructions    Call MD for:  difficulty breathing, headache or visual disturbances   Complete by:  As directed    Call MD for:  extreme fatigue   Complete by:  As directed    Call MD for:  hives   Complete by:  As directed    Call MD for:  persistant dizziness or light-headedness   Complete by:  As directed    Call MD for:  persistant nausea and vomiting   Complete by:  As directed    Call MD for:  redness, tenderness, or signs of infection (pain, swelling, redness, odor or green/yellow discharge around incision site)   Complete by:  As directed    Call MD for:  severe uncontrolled pain   Complete by:  As directed    Call MD for:  temperature >100.4   Complete by:  As directed    Diet - low sodium heart healthy   Complete by:  As directed    Discharge instructions   Complete by:  As directed    Call [phone] for a followup appointment. Take a stool softener while you are using pain medications.    Driving Restrictions   Complete by:  As directed    Do not drive for 2 weeks.   Increase activity slowly   Complete by:  As directed    Lifting restrictions   Complete by:  As directed    Do not lift more than 5 pounds. No excessive bending or twisting.   May shower / Bathe   Complete by:  As directed    Remove the dressing for 3 days after surgery.  You may shower, but leave the incision alone.   Remove dressing in 48 hours   Complete by:  As directed    Your stitches are under the scan and will dissolve by themselves. The Steri-Strips will fall off after you take a few showers. Do not rub back or pick at the wound, Leave the wound alone.     Allergies as of 12/05/2017      Reactions   Procaine Nausea And Vomiting, Other (See Comments)   NOVOCAINE   Sulfacetamide Sodium-sulfur Rash      Medication List    TAKE these medications   aspirin 325 MG tablet Take 325 mg by mouth daily.   B-12 3000 MCG Caps Take 3,000 mcg by mouth daily.   CENTRUM PO Take 1 tablet by mouth daily.   cephALEXin 500 MG capsule Commonly known as:  KEFLEX Take 500 mg by mouth 3 (three) times daily.  ECHINACEA PO Take 334 mg by mouth daily.   fluticasone 50 MCG/ACT nasal spray Commonly known as:  FLONASE Place 1 spray into both nostrils daily as needed for allergies or rhinitis.   hydrochlorothiazide 25 MG tablet Commonly known as:  HYDRODIURIL Take 1 tablet (25 mg total) by mouth daily.   HYDROcodone-acetaminophen 5-325 MG tablet Commonly known as:  NORCO/VICODIN Take 1 tablet by mouth every 6 (six) hours as needed for moderate pain.   losartan 50 MG tablet Commonly known as:  COZAAR Take 1 tablet (50 mg total) by mouth daily.   MSM 1000 MG Caps Take 1,000 mg by mouth daily.   PAPAYA AND ENZYMES PO Take 1 tablet by mouth daily as needed (for heartburn).   VISINE MAXIMUM REDNESS RELIEF OP Place 1 drop into both eyes daily as needed (for irritation).   vitamin C 1000 MG  tablet Take 1,000 mg by mouth daily.   Vitamin D3 2000 units Tabs Take 2,000 Units by mouth daily.        Signed: Ophelia Charter 12/05/2017, 9:55 AM

## 2017-12-05 NOTE — Evaluation (Signed)
Occupational Therapy Evaluation and Discharge Patient Details Name: Carlos Hunt MRN: [id number] DOB: [birth year] Today's Date: 12/05/2017    History of Present Illness Pt is a 72 y/o male s/p L deep brain stimulator insertion. MRI showed no complications with stimulator insertion. PMH includes HTN, tremor, and s/p deep brain stimulator and removal.    Clinical Impression   PTA, pt was living with his was and was independent and working (making auto parts). Pt performing ADLs and functional mobility at supervision level. Educating pt on compensatory techniques for ADLs and IADLs with reducing bending forward and lifting heavy objects. Pt stating that he lifts heavy objects at work and required increased education on need for rest and prevent heavy lifting. Answered all pt questions. Recommend dc home once medically stable per physician. All acute OT needs met and will sign off. Thank you.      Follow Up Recommendations  No OT follow up;Supervision - Intermittent    Equipment Recommendations  None recommended by OT    Recommendations for Other Services       Precautions / Restrictions Precautions Precautions: None Restrictions Weight Bearing Restrictions: No      Mobility Bed Mobility Overal bed mobility: Modified Independent                Transfers Overall transfer level: Needs assistance Equipment used: None Transfers: Sit to/from Stand Sit to Stand: Supervision         General transfer comment: Supervision for safety. No LOB noted.     Balance Overall balance assessment: Needs assistance Sitting-balance support: No upper extremity supported;Feet supported Sitting balance-Leahy Scale: Normal     Standing balance support: No upper extremity supported;During functional activity Standing balance-Leahy Scale: Good                             ADL either performed or assessed with clinical judgement   ADL Overall ADL's : Needs  assistance/impaired                                       General ADL Comments: Upon arrival, pt was dressing and shaving at the sink. Providing pt with education on compensatory techniques for ADLs and IADLs. Educating pt on LB ADLs and to prevent bending forward. Pt reporting he plans to sponge bath at sink till follow up surgery; educating pt on techniques for safe sponge bathing. Discussed that pt should avoid picking up heavy objects and pulling. Pt works in a Retail buyer job, requiring increased education on [employer] and safety. Providing education on compensatory techniques for IADLs including cooking and cleaning     Vision         Perception     Praxis      Pertinent Vitals/Pain Pain Assessment: 0-10 Pain Score: 2  Pain Location: head  Pain Descriptors / Indicators: Aching;Operative site guarding Pain Intervention(s): Monitored during session;Repositioned     Hand Dominance Right   Extremity/Trunk Assessment Upper Extremity Assessment Upper Extremity Assessment: Overall WFL for tasks assessed   Lower Extremity Assessment Lower Extremity Assessment: Overall WFL for tasks assessed   Cervical / Trunk Assessment Cervical / Trunk Assessment: Normal   Communication Communication Communication: No difficulties   Cognition Arousal/Alertness: Awake/alert Behavior During Therapy: WFL for tasks assessed/performed Overall Cognitive Status: Within Functional Limits for tasks assessed  General Comments       Exercises     Shoulder Instructions      Home Living Family/patient expects to be discharged to:: Private residence Living Arrangements: Spouse/significant other Available Help at Discharge: Family;Available 24 hours/day Type of Home: House Home Access: Stairs to enter [employer] of Steps: 2 Entrance Stairs-Rails: None Home Layout: Two level;Able to live on main  level with bedroom/bathroom     Bathroom Shower/Tub: Teacher, early years/pre: Standard     Home Equipment: None          Prior Functioning/Environment Level of Independence: Independent                 OT Problem List: Decreased activity tolerance;Impaired balance (sitting and/or standing);Decreased safety awareness;Decreased knowledge of use of DME or AE;Decreased knowledge of precautions;Pain      OT Treatment/Interventions:      OT Goals(Current goals can be found in the care plan section) Acute Rehab OT Goals Patient Stated Goal: to go home  OT Goal Formulation: All assessment and education complete, DC therapy  OT Frequency:     Barriers to D/C:            Co-evaluation              AM-PAC PT "6 Clicks" Daily Activity     Outcome Measure Help from another person eating meals?: None Help from another person taking care of personal grooming?: None Help from another person toileting, which includes using toliet, bedpan, or urinal?: None Help from another person bathing (including washing, rinsing, drying)?: A Little Help from another person to put on and taking off regular upper body clothing?: None Help from another person to put on and taking off regular lower body clothing?: A Little 6 Click Score: 22   End of Session Nurse Communication: Mobility status;Precautions  Activity Tolerance: Patient tolerated treatment well Patient left: with call bell/phone within reach(OOB in room)  OT Visit Diagnosis: Other abnormalities of gait and mobility (R26.89);Pain Pain - part of body: (head)                Time: 9450-3888 OT Time Calculation (min): 10 min Charges:  OT General Charges $OT Visit: 1 Visit OT Evaluation $OT Eval Low Complexity: 1 Low G-Codes:     Dell Briner MSOT, OTR/L Acute Rehab Pager: [phone] Office: [place] 12/05/2017, 8:37 AM

## 2017-12-05 NOTE — Progress Notes (Signed)
Patient alert and oriented, mae's well, voiding adequate amount of urine, swallowing without difficulty, no c/o pain at time of discharge. Patient discharged home with family. Script and discharged instructions given to patient. Patient and family stated understanding of instructions given. Patient has an appointment with [name]

## 2017-12-07 ENCOUNTER — Other Ambulatory Visit: Payer: Self-pay

## 2017-12-07 ENCOUNTER — Telehealth: Payer: Self-pay | Admitting: Family Medicine

## 2017-12-07 ENCOUNTER — Encounter: Payer: Self-pay | Admitting: Family Medicine

## 2017-12-07 ENCOUNTER — Ambulatory Visit (INDEPENDENT_AMBULATORY_CARE_PROVIDER_SITE_OTHER): Payer: Medicare Other | Admitting: Family Medicine

## 2017-12-07 VITALS — BP 130/72 | HR 75 | Temp 98.0°F | Resp 16 | Ht 68.0 in | Wt 201.0 lb

## 2017-12-07 DIAGNOSIS — K7581 Nonalcoholic steatohepatitis (NASH): Secondary | ICD-10-CM

## 2017-12-07 DIAGNOSIS — R7989 Other specified abnormal findings of blood chemistry: Secondary | ICD-10-CM

## 2017-12-07 DIAGNOSIS — G250 Essential tremor: Secondary | ICD-10-CM

## 2017-12-07 DIAGNOSIS — R945 Abnormal results of liver function studies: Secondary | ICD-10-CM

## 2017-12-07 MED FILL — Thrombin For Soln 5000 Unit: CUTANEOUS | Qty: 5000 | Status: AC

## 2017-12-07 NOTE — Progress Notes (Signed)
Subjective:    Patient ID: Carlos Hunt, male    DOB: 16-Apr-1946, 72 y.o.   MRN: [id number]  12/07/2017  [facility] (pt follow-up from [place] 3/25 to go over labs )    HPI This 72 y.o. male presents for evaluation of elevated liver function tests from 11/30/17 visit.  Patient was advised of elevated liver function studies by [name] during surgical procedure for essential tremor last week.  [name] has scheduled patient for additional surgical procedure at this week yet will be unable to perform procedure if liver function studies are still elevated.  Patient denies acetaminophen use.  Patient denies alcohol intake.  Patient admits to taking 2-3 hydrocodone after surgical procedure in the past 2 weeks.  MRI abdomen 08/2016:  Stable LEFT adrenal angiomyolipoma.  Benign appearing B renal cysts.  Stable hepatic steatosis. No suspicious liver masses.   Must be under general anesthesia this Friday; cannot be under with high enzymes. Has been taking 2-3 hydrocodone. No Tylenol.   Did prescribe Keflex after procedure. Three procedures total.   No alcohol.  No supplements since 11/17/17.    Suffered with acute illness one month ago with Mucinex-D.    BP Readings from Last 3 Encounters:  12/07/17 130/72  12/05/17 138/70  11/30/17 128/72   Wt Readings from Last 3 Encounters:  12/07/17 201 lb (91.2 kg)  12/04/17 200 lb (90.7 kg)  11/30/17 200 lb 3.2 oz (90.8 kg)   Immunization History  Administered Date(s) Administered  . Influenza, Seasonal, Injecte, Preservative Fre 09/23/2012  . Influenza,inj,Quad PF,6+ Mos 07/27/2013, 05/25/2015, 06/03/2016, 06/02/2017  . Influenza-Unspecified 06/08/2014  . Pneumococcal Conjugate-13 11/13/2014  . Pneumococcal Polysaccharide-23 11/27/2015  . Tdap 09/23/2012    Review of Systems  Constitutional: Negative for activity change, appetite change, chills, diaphoresis, fatigue and fever.  Respiratory: Negative for cough and shortness of breath.     Cardiovascular: Negative for chest pain, palpitations and leg swelling.  Gastrointestinal: Negative for abdominal pain, diarrhea, nausea and vomiting.  Endocrine: Negative for cold intolerance, heat intolerance, polydipsia, polyphagia and polyuria.  Skin: Negative for color change, rash and wound.  Neurological: Negative for dizziness, tremors, seizures, syncope, facial asymmetry, speech difficulty, weakness, light-headedness, numbness and headaches.  Psychiatric/Behavioral: Negative for dysphoric mood and sleep disturbance. The patient is not nervous/anxious.     Past Medical History:  Diagnosis Date  . Adenomatous polyps 04/02/2017  . Brain abscess 03/11/2017  . Erectile dysfunction   . Essential hypertension, benign 02/14/2013  . Essential tremor   . GERD (gastroesophageal reflux disease)   . Hx of colonic polyps   . Hypertension   . Pain in left knee   . Pain, joint, shoulder   . PONV (postoperative nausea and vomiting)    nausea no vomiting  . Seasonal allergies   . Testicular hypofunction   . Tobacco use disorder   . Tremor   . Unspecified vitamin D deficiency    Past Surgical History:  Procedure Laterality Date  . COLONOSCOPY  09/09/2011   normal.  Previous polyps.  [name]. Repeat 5 years.  . COLONOSCOPY N/A 07/18/2016   Procedure: COLONOSCOPY;  Surgeon: Manya Silvas, MD;  Location: [facility] ENDOSCOPY;  Service: Endoscopy;  Laterality: N/A;  . HAND SURGERY Left 2011   trauma  . LAPAROSCOPIC INGUINAL HERNIA WITH UMBILICAL HERNIA Right 44/04/1855   Procedure: LAPAROSCOPIC INGUINAL HERNIA REPAIR, right with umbilical herniorrhaphy;  Surgeon: Florene Glen, MD;  Location: [facility] ORS;  Service: General;  Laterality: Right;  .  MINOR PLACEMENT OF FIDUCIAL N/A 11/24/2017   Procedure: MINOR PLACEMENT OF FIDUCIAL;  Surgeon: Erline Levine, MD;  Location: [place];  Service: Neurosurgery;  Laterality: N/A;  Fiducial placement  . PULSE GENERATOR IMPLANT Bilateral 10/23/2016   Procedure:  BILATERAL IMPLANTABLE PULSE GENERATOR;  Surgeon: Erline Levine, MD;  Location: [place];  Service: Neurosurgery;  Laterality: Bilateral;  BILATERAL IMPLANTABLE PULSE GENERATOR  . SUBTHALAMIC STIMULATOR INSERTION Bilateral 10/07/2016   Procedure: BILATERAL DEEP BRAIN STIMULATOR PLACEMENT;  Surgeon: Erline Levine, MD;  Location: [place];  Service: Neurosurgery;  Laterality: Bilateral;  BILATERAL DEEP BRAIN STIMULATOR PLACEMENT  . SUBTHALAMIC STIMULATOR INSERTION Left 12/04/2017   Procedure: Left Deep Brain Stimulator Placement;  Surgeon: Erline Levine, MD;  Location: [place];  Service: Neurosurgery;  Laterality: Left;  Left deep brain stimulator placement  . SUBTHALAMIC STIMULATOR REMOVAL Left 03/10/2017   Procedure: REMOVAL DEEP BRAIN STIMULATOR AND ASPIRATION OF BRAIN ABSCESS;  Surgeon: Earnie Larsson, MD;  Location: [place];  Service: Neurosurgery;  Laterality: Left;  . UMBILICAL HERNIA REPAIR N/A 07/15/2017   Procedure: HERNIA REPAIR UMBILICAL ADULT;  Surgeon: Florene Glen, MD;  Location: [facility];  Service: General;  Laterality: N/A;   Allergies  Allergen Reactions  . Procaine Nausea And Vomiting and Other (See Comments)    NOVOCAINE  . Sulfacetamide Sodium-Sulfur Rash   Current Outpatient Medications on File Prior to Visit  Medication Sig Dispense Refill  . Ascorbic Acid (VITAMIN C) 1000 MG tablet Take 1,000 mg by mouth daily.    Marland Kitchen aspirin 325 MG tablet Take 325 mg by mouth daily.    . cephALEXin (KEFLEX) 500 MG capsule Take 500 mg by mouth 3 (three) times daily.    . Cholecalciferol (VITAMIN D3) 2000 units TABS Take 2,000 Units by mouth daily.    . Cyanocobalamin (B-12) 3000 MCG CAPS Take 3,000 mcg by mouth daily.    . Digestive Enzymes (PAPAYA AND ENZYMES PO) Take 1 tablet by mouth daily as needed (for heartburn).     Marland Kitchen ECHINACEA PO Take 334 mg by mouth daily.    . fluticasone (FLONASE) 50 MCG/ACT nasal spray Place 1 spray into both nostrils daily as needed for allergies or rhinitis.    .  hydrochlorothiazide (HYDRODIURIL) 25 MG tablet Take 1 tablet (25 mg total) by mouth daily. 90 tablet 3  . HYDROcodone-acetaminophen (NORCO/VICODIN) 5-325 MG tablet Take 1 tablet by mouth every 6 (six) hours as needed for moderate pain.  0  . losartan (COZAAR) 50 MG tablet Take 1 tablet (50 mg total) by mouth daily. 90 tablet 3  . Methylsulfonylmethane (MSM) 1000 MG CAPS Take 1,000 mg by mouth daily.    . Multiple Vitamins-Minerals (CENTRUM PO) Take 1 tablet by mouth daily.     . Tetrahydroz-Glyc-Hyprom-PEG (VISINE MAXIMUM REDNESS RELIEF OP) Place 1 drop into both eyes daily as needed (for irritation).      No current facility-administered medications on file prior to visit.    Social History   Socioeconomic History  . Marital status: Married    Spouse name: [name]  . Number of children: 4  . Years of education: Not on file  . Highest education level: Not on file  Occupational History  . Occupation: building maintenance    Comment: Chemical engineer  Social Needs  . Financial resource strain: Not on file  . Food insecurity:    Worry: Not on file    Inability: Not on file  . Transportation needs:    Medical: Not on file  Non-medical: Not on file  Tobacco Use  . Smoking status: Former Smoker    Packs/day: 3.00    Years: 10.00    Pack years: 30.00    Types: Cigarettes    Last attempt to quit: 09/23/1970    Years since quitting: 47.2  . Smokeless tobacco: Never Used  . Tobacco comment: quit in the 1970's  Substance and Sexual Activity  . Alcohol use: Yes    Alcohol/week: 0.6 oz    Types: 1 Shots of liquor per week    Comment: occasional; once every 6 months  . Drug use: No  . Sexual activity: Yes    Birth control/protection: Post-menopausal  Lifestyle  . Physical activity:    Days per week: Not on file    Minutes per session: Not on file  . Stress: Not on file  Relationships  . Social connections:    Talks on phone: Not on file    Gets together: Not on file     Attends religious service: Not on file    Active member of club or organization: Not on file    Attends meetings of clubs or organizations: Not on file    Relationship status: Not on file  . Intimate partner violence:    Fear of current or ex partner: Not on file    Emotionally abused: Not on file    Physically abused: Not on file    Forced sexual activity: Not on file  Other Topics Concern  . Not on file  Social History Narrative   Marital status: married x 46 years.      Children: 2 daughters, 2 sons; 6 grandchildren.      Lives: with wife.        Employment: RETIRED in 2018 from [employer] x 13 years; moderately happy.  In 2019, working for [employer] in [place] full time.      Tobacco: quit 42 years ago; smoked x 10 years.       Alcohol: socially; weekends.  Rarely.      Drugs: none      Exercise:  None; work is physical.        Seatbelt: 100%      Sunscreen: rare sun exposure      Guns:  4 gun; unloaded, not stored in locked cabinet.       Smoke alarm and carbon monoxide detector in the home.      Caffeine use: consumes a minimal amount       Advanced Directives/Living Will: patient does NOT have Living Will; Does NOT have HCPOA; getting ready to work on it.     Organ Donor: NO         Family History  Problem Relation Age of Onset  . Dementia Mother   . Parkinson's disease Father   . Cancer Father 52       prostate  . Heart disease Father 12       CAD/CABG  . Hypertension Father   . Hyperlipidemia Father   . Parkinsonism Father   . Parkinson's disease Brother   . Diabetes Sister        Objective:    BP 130/72   Pulse 75   Temp 98 F (36.7 C) (Oral)   Resp 16   Ht 5' 8"  (1.727 m)   Wt 201 lb (91.2 kg)   SpO2 95%   BMI 30.56 kg/m  Physical Exam  Constitutional: He is oriented to person, place, and time. He appears well-developed  and well-nourished. No distress.  HENT:  Head: Normocephalic and atraumatic.  Right Ear: External ear normal.  Left Ear:  External ear normal.  Nose: Nose normal.  Mouth/Throat: Oropharynx is clear and moist.  Periorbital swelling present bilateral eyes.  Eyes: Pupils are equal, round, and reactive to light. Conjunctivae and EOM are normal.  Neck: Normal range of motion. Neck supple. Carotid bruit is not present. No thyromegaly present.  Cardiovascular: Normal rate, regular rhythm, normal heart sounds and intact distal pulses. Exam reveals no gallop and no friction rub.  No murmur heard. Pulmonary/Chest: Effort normal and breath sounds normal. He has no wheezes. He has no rales.  Abdominal: Soft. Bowel sounds are normal. He exhibits no distension and no mass. There is no tenderness. There is no rebound and no guarding.  Lymphadenopathy:    He has no cervical adenopathy.  Neurological: He is alert and oriented to person, place, and time. No cranial nerve deficit. He exhibits normal muscle tone. Coordination normal.  Skin: Skin is warm and dry. No rash noted. He is not diaphoretic.  Images and place on scalp.  Residual blood present with Tegaderm in place.  Psychiatric: He has a normal mood and affect. His behavior is normal.  Nursing note and vitals reviewed.  No results found. Depression screen [facility] 2/9 12/07/2017 11/30/2017 09/24/2017 06/04/2017 06/02/2017  Decreased Interest 0 0 0 0 0  Down, Depressed, Hopeless 0 0 0 0 0  PHQ - 2 Score 0 0 0 0 0   Fall Risk  12/07/2017 11/30/2017 11/17/2017 09/24/2017 07/20/2017  Falls in the past year? No No No No No  Number falls in past yr: - - - - -  Injury with Fall? - - - - -  Follow up - - - - -        Assessment & Plan:   1. Elevated liver function tests   2. NASH (nonalcoholic steatohepatitis)   3. Essential tremor     Acute elevation in liver function studies: Acute worsening.  Very minimal liver function study elevation 6 months ago at last visit.  Etiology unclear at this time despite previous MRI of abdomen in December 2017 that showed nonalcoholic  steatohepatitis.  Patient is actually lost 10 pounds in the past year thus we would expect his liver function studies to improve from nonalcoholic fatty liver.  Repeat liver function studies today.  Question if perioperative medications for essential tremor caused an acute elevation in liver function test.  Obtain abdominal ultrasound to visualize liver at this time.  Essential tremor: Patient underwent left deep brain stimulator placement on 32919.  Doing well postoperatively at this time with mild headache only.  Not requiring opiates postoperatively.  Denies fever, chills, sweats.  Denies focal neurological symptoms.  Orders Placed This Encounter  Procedures  . US Abdomen Complete    Standing Status:   Future    Standing Expiration Date:   02/07/2019    Order Specific Question:   Reason for Exam (SYMPTOM  OR DIAGNOSIS REQUIRED)    Answer:   elevated liver function studies    Order Specific Question:   Preferred imaging location?    Answer:   [facility]  . Acute Hep Panel & Hep B Surface Ab  . CBC with Differential/Platelet  . Comprehensive metabolic panel   No orders of the defined types were placed in this encounter.   No follow-ups on file.   Carlos Hunt Elayne Guerin, M.D. Primary Care at [facility] previously  [facility] [address] [place], [place]  [address] [phone] phone [phone] fax

## 2017-12-07 NOTE — Telephone Encounter (Signed)
Tried calling pt to schedule STAT U/S on cell phone and home phone but was unable to reach pt. I did leave a message to give [place] a call back to get this scheduled.

## 2017-12-07 NOTE — Patient Instructions (Addendum)
IF you received an x-ray today, you will receive an invoice from [facility]. Please contact [facility] at [phone] with questions or concerns regarding your invoice.   IF you received labwork today, you will receive an invoice from [facility]. Please contact [facility] at [phone] with questions or concerns regarding your invoice.   Our billing staff will not be able to assist you with questions regarding bills from these companies.  You will be contacted with the lab results as soon as they are available. The fastest way to get your results is to activate your My Chart account. Instructions are located on the last page of this paperwork. If you have not heard from [place] regarding the results in 2 weeks, please contact this office.     Liver Function Tests Liver function tests are blood tests to see how well your liver is working. The proteins and enzymes measured in the test can alert your health care provider to inflammation, damage, or disease in your liver. It is common to have liver function tests:  During annual physical exams.  When you are taking certain medicines.  If you have liver disease.  If you drink a lot of alcohol.  When you are not feeling well.  When you have other conditions that may affect the liver.  Substances measured may include:  Alanine transaminase (ALT). This is an enzyme in the liver.  Aspartate transaminase (AST). This is an enzyme in the liver, heart, and muscles.  Alkaline phosphatase (ALP). This is a protein in the liver, bile ducts, and bone. It is also in other body tissues.  Total bilirubin. This is a yellow pigment in bile.  Albumin. This is a protein in the liver.  Prothrombin time and international normalized ratio (PT and INR). PT measures the time that it takes for your blood to clot. INR is a calculation of blood clotting time based upon your PT result. It is also calculated based on normal ranges defined by  the laboratory that processed your lab test.  Total protein. This measures two proteins, albumin and globulin, found in the blood.  How do I prepare for this test? How you prepare will depend on which tests are being done and the reason why these tests are being done. You may need to:  Avoid eating for 4-6 hours before the test or as directed by your health care provider.  Stop taking certain medicines prior to your blood test as directed by your health care provider.  What do the results mean? It is your responsibility to obtain your test results. Ask the lab or department performing the test when and how you will get your results. Contact your health care provider to discuss any questions you have about your results. RANGE OF NORMAL VALUES Ranges for normal values may vary among different labs and hospitals. You should always check with your health care provider after having lab work or other tests done to discuss the meaning of your test results and whether your values are considered within normal limits. The following are normal ranges for substances measured in liver function tests: ALT  Infant: may be twice as high as adult values.  Child or adult: 4-36 international units/L at 37C or 4-36 units/L (SI units).  Elderly: may be slightly higher than adult values. AST  Newborn 77-17 days old: 35-140 units/L.  Child under 54 years old: 15-60 units/L.  22-50 years old: 15-50 units/L.  29-3 years old: 10-50 units/L.  12-18  years old: 10-40 units/L.  Adult: 0-35 units/L or 0-0.58 microkatal/L (SI units).  Elderly: slightly higher than adults. ALP  Child under 15 years old: 85-235 units/L.  35-40 years old: 65-210 units/L.  69-25 years old: 60-300 units/L.  47-50 years old: 30-200 units/L.  Adult: 30-120 units/L or 0.5-2.0 microkatal/L (SI units).  Elderly: slightly higher than adult. Total bilirubin  Newborn: 1.0-12.0 mg/dL or 17.1-205 micromoles/L (SI units).  Adult,  elderly, or child: 0.3-1.0 mg/dL or 5.1-17 micromoles/L. Albumin  Premature infant: 3.0-4.2 g/dL.  Newborn: 3.5-5.4 g/dL.  Infant: 4.4-5.4 g/dL.  Child: 4.0-5.9 g/dL.  Adult or elderly: 3.5-5.0 g/dL or 35-50 g/L (SI units). PT  11.0-12.5 seconds; 85%-100%. INR  0.8-1.1. Total protein  Premature infant: 4.2-7.6 g/dL.  Newborn: 4.6-7.4 g/dL.  Infant: 6.0-6.7 g/dL.  Child: 6.2-8.0 g/dL.  Adult or elderly: 6.4-8.3 g/dL or 64-83 g/L (SI units). MEANING OF RESULTS OUTSIDE NORMAL VALUE RANGES Sometimes test results can be abnormal due to other factors, such as medicines, exercise, or pregnancy. Follow up with your health care provider if you have any questions about test results outside the normal value ranges. ALT  Levels above the normal range, along with other test results, may indicate liver disease. AST  Levels above the normal range, along with other test results, may indicate liver disease. Sometimes levels also increase after burns, surgery, heart attack, muscle damage, or seizure. ALP  Levels above the normal range, along with other test results, may indicate biliary obstruction, diseases of the liver, bone disease, thyroid disease, tumors, fractures, leukemia or lymphoma, or several other conditions. People with blood type O or B may show higher levels after a fatty meal.  Levels below the normal range, along with other test results, may indicate bone and teeth conditions, malnutrition, protein deficiency, or Wilson disease. Total bilirubin  Levels above the normal range, along with other test results, may indicate problems with the liver, gallbladder, or bile ducts. Albumin  Levels above the normal range, along with other test results, may indicate dehydration. They may also be caused by a diet that is high in protein. Sometimes, the band placed around the upper arm during the process of drawing blood can cause the level of this protein in your blood to rise and  give you a result above the normal range.  Levels below the normal range, along with other tests results, may indicate kidney disease, liver disease, or malabsorption of nutrients. PT and INR  Levels above the normal range mean your blood is clotting slower than normal. This may be due to blood disorders, liver disorders, or low levels of vitamin K. Total protein  Levels above the normal range, along with other test results, may be due to infection or other diseases.  Levels below the normal range, along with other test results, may be due to an immune system disorder, bleeding, burns, kidney disorder, liver disease, trouble absorbing or getting enough nutrients, or other conditions that affect the intestines. Talk with your health care provider to discuss your results, treatment options, and if necessary, the need for more tests. Talk with your health care provider if you have any questions about your results. This information is not intended to replace advice given to you by your health care provider. Make sure you discuss any questions you have with your health care provider. Document Released: 09/27/2004 Document Revised: 04/30/2016 Document Reviewed: 12/29/2013 Elsevier Interactive Patient Education  [employer].

## 2017-12-07 NOTE — Anesthesia Postprocedure Evaluation (Signed)
Anesthesia Post Note  Patient: Carlos Hunt  Procedure(s) Performed: Left Deep Brain Stimulator Placement (Left Head)     Patient location during evaluation: PACU Anesthesia Type: MAC Level of consciousness: awake and alert Pain management: pain level controlled Vital Signs Assessment: post-procedure vital signs reviewed and stable Respiratory status: spontaneous breathing, nonlabored ventilation and respiratory function stable Cardiovascular status: stable and blood pressure returned to baseline Postop Assessment: no apparent nausea or vomiting Anesthetic complications: no    Last Vitals:  Vitals:   12/05/17 0435 12/05/17 0730  BP: 125/85 138/70  Pulse: 74 61  Resp: 16 16  Temp: 36.6 C 36.7 C  SpO2: 95% 98%    Last Pain:  Vitals:   12/05/17 1029  TempSrc:   PainSc: 2                  Catalina Gravel

## 2017-12-08 LAB — CBC WITH DIFFERENTIAL/PLATELET
Basophils Absolute: 0.1 10*3/uL (ref 0.0–0.2)
Basos: 1 %
EOS (ABSOLUTE): 0.1 10*3/uL (ref 0.0–0.4)
Eos: 1 %
Hematocrit: 48.4 % (ref 37.5–51.0)
Hemoglobin: 16.8 g/dL (ref 13.0–17.7)
Immature Grans (Abs): 0 10*3/uL (ref 0.0–0.1)
Immature Granulocytes: 0 %
Lymphocytes Absolute: 2.6 10*3/uL (ref 0.7–3.1)
Lymphs: 25 %
MCH: 31.5 pg (ref 26.6–33.0)
MCHC: 34.7 g/dL (ref 31.5–35.7)
MCV: 91 fL (ref 79–97)
Monocytes Absolute: 1 10*3/uL — ABNORMAL HIGH (ref 0.1–0.9)
Monocytes: 9 %
Neutrophils Absolute: 6.6 10*3/uL (ref 1.4–7.0)
Neutrophils: 64 %
Platelets: 358 10*3/uL (ref 150–379)
RBC: 5.33 x10E6/uL (ref 4.14–5.80)
RDW: 13.3 % (ref 12.3–15.4)
WBC: 10.4 10*3/uL (ref 3.4–10.8)

## 2017-12-08 LAB — COMPREHENSIVE METABOLIC PANEL
ALT: 53 IU/L — ABNORMAL HIGH (ref 0–44)
AST: 33 IU/L (ref 0–40)
Albumin/Globulin Ratio: 1.7 (ref 1.2–2.2)
Albumin: 4.7 g/dL (ref 3.5–4.8)
Alkaline Phosphatase: 84 IU/L (ref 39–117)
BUN/Creatinine Ratio: 14 (ref 10–24)
BUN: 15 mg/dL (ref 8–27)
Bilirubin Total: 0.3 mg/dL (ref 0.0–1.2)
CO2: 27 mmol/L (ref 20–29)
Calcium: 10.6 mg/dL — ABNORMAL HIGH (ref 8.6–10.2)
Chloride: 96 mmol/L (ref 96–106)
Creatinine, Ser: 1.08 mg/dL (ref 0.76–1.27)
GFR calc Af Amer: 79 mL/min/{1.73_m2} (ref >59)
GFR calc non Af Amer: 69 mL/min/{1.73_m2} (ref >59)
Globulin, Total: 2.7 g/dL (ref 1.5–4.5)
Glucose: 97 mg/dL (ref 65–99)
Potassium: 5 mmol/L (ref 3.5–5.2)
Sodium: 142 mmol/L (ref 134–144)
Total Protein: 7.4 g/dL (ref 6.0–8.5)

## 2017-12-08 LAB — ACUTE HEP PANEL AND HEP B SURFACE AB
Hep A IgM: NEGATIVE
Hep B C IgM: NEGATIVE
Hep C Virus Ab: <0.1 s/co ratio (ref 0.0–0.9)
Hepatitis B Surf Ab Quant: <3.1 m[IU]/mL — ABNORMAL LOW (ref >9.9)
Hepatitis B Surface Ag: NEGATIVE

## 2017-12-09 NOTE — Telephone Encounter (Signed)
Returned pt call to make sure he knew about his stat u/s scheduled for 4/4 at [facility] pt knew about it and pt also asked that someone call him to go over his bloodwork

## 2017-12-10 ENCOUNTER — Telehealth: Payer: Self-pay | Admitting: Family Medicine

## 2017-12-10 ENCOUNTER — Ambulatory Visit
Admission: RE | Admit: 2017-12-10 | Discharge: 2017-12-10 | Disposition: A | Discharge status: Home / Self Care | Payer: Medicare Other | Source: Ambulatory Visit | Attending: Family Medicine | Admitting: Family Medicine

## 2017-12-10 DIAGNOSIS — R7989 Other specified abnormal findings of blood chemistry: Secondary | ICD-10-CM | POA: Insufficient documentation

## 2017-12-10 DIAGNOSIS — R945 Abnormal results of liver function studies: Secondary | ICD-10-CM

## 2017-12-10 DIAGNOSIS — I700 Atherosclerosis of aorta: Secondary | ICD-10-CM | POA: Insufficient documentation

## 2017-12-10 DIAGNOSIS — D1771 Benign lipomatous neoplasm of kidney: Secondary | ICD-10-CM | POA: Diagnosis not present

## 2017-12-10 DIAGNOSIS — N281 Cyst of kidney, acquired: Secondary | ICD-10-CM | POA: Insufficient documentation

## 2017-12-10 NOTE — Telephone Encounter (Signed)
Carlos Hunt with [facility] calling with results of complete abdominal ultrasound:    IMPRESSION: 1. Increase in size of previously characterized left kidney angiomyolipoma now measuring 1.2 cm. Given the interval growth from previous exam a follow-up examination in 12 months is advised. 2. Echogenic liver compatible with hepatic steatosis. 3. Kidney cysts. 4.  Aortic Atherosclerosis (ICD10-I70.0).

## 2017-12-10 NOTE — Telephone Encounter (Signed)
See below

## 2017-12-15 ENCOUNTER — Telehealth: Payer: Self-pay

## 2017-12-15 NOTE — Telephone Encounter (Signed)
Provider, please review and release results.

## 2017-12-15 NOTE — Telephone Encounter (Signed)
Copied from [place] [phone]. Topic: Quick Communication - Lab Results >> Dec 15, 2017  9:58 AM Lennox Solders wrote: Pt has abd ultrasound at [facility] on 12-10-17

## 2017-12-16 NOTE — Telephone Encounter (Signed)
Pt calling to check status on getting [place] results. He states he is scheduled for surgery next Tuesday and depending on these results if he will have the surgery or not. He would like a call as soon as possible.

## 2017-12-17 ENCOUNTER — Telehealth: Payer: Self-pay | Admitting: Neurology

## 2017-12-17 NOTE — Telephone Encounter (Signed)
Appts made with patient. He needed mornings.

## 2017-12-17 NOTE — Telephone Encounter (Signed)
-----   Message from [place], DO sent at 12/17/2017  8:03 AM EDT ----- I don't see that he has an appt with me in the future to turn on his DBS?  Needs one in about 4 weeks.  You can use 5/20 at 2:30

## 2017-12-21 ENCOUNTER — Encounter (HOSPITAL_COMMUNITY): Payer: Self-pay | Admitting: *Deleted

## 2017-12-21 ENCOUNTER — Other Ambulatory Visit: Payer: Self-pay

## 2017-12-21 ENCOUNTER — Other Ambulatory Visit: Payer: Self-pay | Admitting: Neurosurgery

## 2017-12-21 NOTE — Telephone Encounter (Signed)
Patient notified of his results.

## 2017-12-21 NOTE — Telephone Encounter (Signed)
Abdominal US results noted and reviewed.  Please call patient with results:  Liver shows fatty liver only; ongoing weight loss and low-fat diet are both important.  LEFT kidney angiomyolipoma has increased in size slightly; thus, repeat ultrasound recommended in 12 months.

## 2017-12-21 NOTE — Telephone Encounter (Signed)
From phone encounter dated 12/10/17 from Dr. Tamala Julian: "Abdominal US results noted and reviewed.  Please call patient with results:  Liver shows fatty liver only; ongoing weight loss and low-fat diet are both important.  LEFT kidney angiomyolipoma has increased in size slightly; thus, repeat ultrasound recommended in 12 months."  Phone call to patient, unable to reach. If patient calls back, transfer patient to triage RN to release results and advise patient imaging reveals no contraindication to surgery per Dr. Tamala Julian.

## 2017-12-21 NOTE — Telephone Encounter (Signed)
Mariann Laster CMA advised patient of lab results on 12/09/17.

## 2017-12-21 NOTE — Telephone Encounter (Signed)
Previous message sent regarding abdominal ultrasound results. Abdominal ultrasound only shows fatty liver.  No other abnormalities of liver.  LEFT kidney angiomyolipoma has slightly increased in size and patient will need repeat imaging in one year.  No contra-indications to surgery.

## 2017-12-21 NOTE — Telephone Encounter (Signed)
See phone encounter dated 12/15/17.

## 2017-12-22 ENCOUNTER — Ambulatory Visit (HOSPITAL_COMMUNITY)
Admission: RE | Admit: 2017-12-22 | Discharge: 2017-12-22 | Disposition: A | Discharge status: Home / Self Care | Payer: Medicare Other | Source: Ambulatory Visit | Attending: Neurosurgery | Admitting: Neurosurgery

## 2017-12-22 ENCOUNTER — Inpatient Hospital Stay (HOSPITAL_COMMUNITY): Payer: Medicare Other | Admitting: Anesthesiology

## 2017-12-22 ENCOUNTER — Encounter (HOSPITAL_COMMUNITY)
Admission: RE | Disposition: A | Discharge status: Home / Self Care | Payer: Self-pay | Source: Ambulatory Visit | Attending: Neurosurgery

## 2017-12-22 ENCOUNTER — Encounter (HOSPITAL_COMMUNITY): Payer: Self-pay | Admitting: Anesthesiology

## 2017-12-22 DIAGNOSIS — G060 Intracranial abscess and granuloma: Secondary | ICD-10-CM | POA: Diagnosis not present

## 2017-12-22 DIAGNOSIS — Z5309 Procedure and treatment not carried out because of other contraindication: Secondary | ICD-10-CM | POA: Insufficient documentation

## 2017-12-22 DIAGNOSIS — Z79899 Other long term (current) drug therapy: Secondary | ICD-10-CM | POA: Diagnosis not present

## 2017-12-22 DIAGNOSIS — Z7982 Long term (current) use of aspirin: Secondary | ICD-10-CM | POA: Insufficient documentation

## 2017-12-22 DIAGNOSIS — L02811 Cutaneous abscess of head [any part, except face]: Secondary | ICD-10-CM | POA: Diagnosis not present

## 2017-12-22 DIAGNOSIS — I10 Essential (primary) hypertension: Secondary | ICD-10-CM | POA: Diagnosis not present

## 2017-12-22 DIAGNOSIS — Z884 Allergy status to anesthetic agent status: Secondary | ICD-10-CM | POA: Insufficient documentation

## 2017-12-22 DIAGNOSIS — G250 Essential tremor: Secondary | ICD-10-CM | POA: Diagnosis not present

## 2017-12-22 DIAGNOSIS — Z882 Allergy status to sulfonamides status: Secondary | ICD-10-CM | POA: Diagnosis not present

## 2017-12-22 DIAGNOSIS — Z4802 Encounter for removal of sutures: Secondary | ICD-10-CM | POA: Insufficient documentation

## 2017-12-22 DIAGNOSIS — R001 Bradycardia, unspecified: Secondary | ICD-10-CM | POA: Diagnosis not present

## 2017-12-22 DIAGNOSIS — Z87891 Personal history of nicotine dependence: Secondary | ICD-10-CM | POA: Diagnosis not present

## 2017-12-22 HISTORY — DX: Unspecified injury of head, initial encounter: S09.90XA

## 2017-12-22 SURGERY — CANCELLED PROCEDURE
Anesthesia: General | Laterality: Left

## 2017-12-22 MED ORDER — LIDOCAINE HCL (CARDIAC) 20 MG/ML IV SOLN
INTRAVENOUS | Status: DC | PRN
  Administered 2017-12-22: 40 mg via INTRAVENOUS

## 2017-12-22 MED ORDER — FENTANYL CITRATE (PF) 250 MCG/5ML IJ SOLN
INTRAMUSCULAR | Status: AC
  Filled 2017-12-22: qty 5

## 2017-12-22 MED ORDER — ONDANSETRON HCL 4 MG/2ML IJ SOLN: 4.0000 mg | Freq: Once | INTRAMUSCULAR | Status: DC | PRN

## 2017-12-22 MED ORDER — OXYCODONE HCL 5 MG PO TABS: 5.0000 mg | ORAL_TABLET | Freq: Once | ORAL | Status: DC | PRN

## 2017-12-22 MED ORDER — PROPOFOL 10 MG/ML IV BOLUS
INTRAVENOUS | Status: DC | PRN
  Administered 2017-12-22: 150 mg via INTRAVENOUS

## 2017-12-22 MED ORDER — CEFAZOLIN SODIUM-DEXTROSE 2-4 GM/100ML-% IV SOLN
INTRAVENOUS | Status: AC
  Filled 2017-12-22: qty 100

## 2017-12-22 MED ORDER — SUGAMMADEX SODIUM 500 MG/5ML IV SOLN
INTRAVENOUS | Status: AC
  Filled 2017-12-22: qty 5

## 2017-12-22 MED ORDER — LIDOCAINE-EPINEPHRINE 1 %-1:100000 IJ SOLN
INTRAMUSCULAR | Status: AC
  Filled 2017-12-22: qty 1

## 2017-12-22 MED ORDER — THROMBIN 5000 UNITS EX SOLR
CUTANEOUS | Status: AC
  Filled 2017-12-22: qty 5000

## 2017-12-22 MED ORDER — ROCURONIUM BROMIDE 100 MG/10ML IV SOLN
INTRAVENOUS | Status: DC | PRN
  Administered 2017-12-22: 50 mg via INTRAVENOUS

## 2017-12-22 MED ORDER — BUPIVACAINE HCL (PF) 0.5 % IJ SOLN
INTRAMUSCULAR | Status: AC
  Filled 2017-12-22: qty 30

## 2017-12-22 MED ORDER — LACTATED RINGERS IV SOLN
INTRAVENOUS | Status: DC | PRN
  Administered 2017-12-22: 07:00:00 via INTRAVENOUS

## 2017-12-22 MED ORDER — VANCOMYCIN HCL 1000 MG IV SOLR
INTRAVENOUS | Status: AC
  Filled 2017-12-22: qty 1000

## 2017-12-22 MED ORDER — VANCOMYCIN HCL IN DEXTROSE 1-5 GM/200ML-% IV SOLN
INTRAVENOUS | Status: AC
  Filled 2017-12-22: qty 200

## 2017-12-22 MED ORDER — LIDOCAINE 2% (20 MG/ML) 5 ML SYRINGE
INTRAMUSCULAR | Status: AC
  Filled 2017-12-22: qty 5

## 2017-12-22 MED ORDER — CEFAZOLIN SODIUM-DEXTROSE 2-4 GM/100ML-% IV SOLN
2.0000 g | Freq: Once | INTRAVENOUS | Status: AC
  Administered 2017-12-22: 2 g via INTRAVENOUS

## 2017-12-22 MED ORDER — OXYCODONE HCL 5 MG/5ML PO SOLN: 5.0000 mg | Freq: Once | ORAL | Status: DC | PRN

## 2017-12-22 MED ORDER — PROPOFOL 10 MG/ML IV BOLUS
INTRAVENOUS | Status: AC
  Filled 2017-12-22: qty 40

## 2017-12-22 MED ORDER — MIDAZOLAM HCL 2 MG/2ML IJ SOLN
INTRAMUSCULAR | Status: AC
  Filled 2017-12-22: qty 2

## 2017-12-22 MED ORDER — VANCOMYCIN HCL IN DEXTROSE 1-5 GM/200ML-% IV SOLN
1000.0000 mg | Freq: Once | INTRAVENOUS | Status: AC
  Administered 2017-12-22 (×2): 1000 mg via INTRAVENOUS

## 2017-12-22 MED ORDER — FENTANYL CITRATE (PF) 100 MCG/2ML IJ SOLN
INTRAMUSCULAR | Status: DC | PRN
  Administered 2017-12-22 (×2): 50 ug via INTRAVENOUS

## 2017-12-22 MED ORDER — ROCURONIUM BROMIDE 10 MG/ML (PF) SYRINGE
PREFILLED_SYRINGE | INTRAVENOUS | Status: AC
  Filled 2017-12-22: qty 5

## 2017-12-22 MED ORDER — SUGAMMADEX SODIUM 500 MG/5ML IV SOLN
INTRAVENOUS | Status: DC | PRN
  Administered 2017-12-22: 400 mg via INTRAVENOUS

## 2017-12-22 MED ORDER — FENTANYL CITRATE (PF) 100 MCG/2ML IJ SOLN: 25.0000 ug | INTRAMUSCULAR | Status: DC | PRN

## 2017-12-22 SURGICAL SUPPLY — 36 items
BANDAGE ADH SHEER 1  50/CT (GAUZE/BANDAGES/DRESSINGS) IMPLANT
BLADE CLIPPER SURG (BLADE) IMPLANT
BLADE SURG 11 STRL SS (BLADE) IMPLANT
BOOT SUTURE AID YELLOW STND (SUTURE) IMPLANT
CANISTER SUCT 3000ML PPV (MISCELLANEOUS) IMPLANT
CLIP RANEY DISP (INSTRUMENTS) IMPLANT
COIL EXT DBS STRETCH 40 (Lead) IMPLANT
COVER BACK TABLE 60X90IN (DRAPES) IMPLANT
DECANTER SPIKE VIAL GLASS SM (MISCELLANEOUS) IMPLANT
DRAPE C-ARM 42X72 X-RAY (DRAPES) IMPLANT
DRAPE CAMERA CLOSED 9X96 (DRAPES) IMPLANT
DRSG TELFA 3X8 NADH (GAUZE/BANDAGES/DRESSINGS) IMPLANT
DURAPREP 26ML APPLICATOR (WOUND CARE) IMPLANT
GAUZE SPONGE 4X4 12PLY STRL (GAUZE/BANDAGES/DRESSINGS) IMPLANT
GAUZE SPONGE 4X4 16PLY XRAY LF (GAUZE/BANDAGES/DRESSINGS) IMPLANT
GLOVE BIO SURGEON STRL SZ8 (GLOVE) IMPLANT
GLOVE ECLIPSE 8.0 STRL XLNG CF (GLOVE) IMPLANT
GLOVE INDICATOR 8.0 STRL GRN (GLOVE) IMPLANT
GLOVE INDICATOR 8.5 STRL (GLOVE) IMPLANT
GOWN STRL REUS W/ TWL LRG LVL3 (GOWN DISPOSABLE) IMPLANT
GOWN STRL REUS W/ TWL XL LVL3 (GOWN DISPOSABLE) IMPLANT
GOWN STRL REUS W/TWL 2XL LVL3 (GOWN DISPOSABLE) IMPLANT
GOWN STRL REUS W/TWL LRG LVL3 (GOWN DISPOSABLE)
GOWN STRL REUS W/TWL XL LVL3 (GOWN DISPOSABLE)
KIT BASIN OR (CUSTOM PROCEDURE TRAY) IMPLANT
KIT REMOVER STAPLE SKIN (MISCELLANEOUS) IMPLANT
MARKER SKIN DUAL TIP RULER LAB (MISCELLANEOUS) IMPLANT
NEEDLE HYPO 25X1 1.5 SAFETY (NEEDLE) IMPLANT
NEEDLE SPNL 18GX3.5 QUINCKE PK (NEEDLE) IMPLANT
NEEDLE SPNL 22GX3.5 QUINCKE BK (NEEDLE) IMPLANT
PACK LAMINECTOMY NEURO (CUSTOM PROCEDURE TRAY) IMPLANT
PAD ARMBOARD 7.5X6 YLW CONV (MISCELLANEOUS) IMPLANT
STAPLER SKIN PROX WIDE 3.9 (STAPLE) IMPLANT
SUT ETHILON 3 0 PS 1 (SUTURE) IMPLANT
SUT VIC AB 2-0 CP2 18 (SUTURE) IMPLANT
TOOL TUNNELING (INSTRUMENTS) ×3 IMPLANT

## 2017-12-22 NOTE — Anesthesia Postprocedure Evaluation (Signed)
Anesthesia Post Note  Patient: Carlos Hunt  Procedure(s) Performed: CANCELLED PROCEDURE     Anesthesia Post Evaluation  Last Vitals:  Vitals:   12/22/17 0858 12/22/17 0900  BP: 137/68   Pulse: 62 62  Resp: 17 15  Temp: (!) 36.4 C   SpO2: 96% 96%    Last Pain:  Vitals:   12/22/17 0858  TempSrc:   PainSc: 0-No pain                 Naamah Boggess COKER

## 2017-12-22 NOTE — Brief Op Note (Signed)
12/22/2017  8:32 AM  PATIENT:  Carlos Hunt  72 y.o. male  PRE-OPERATIVE DIAGNOSIS:  Essential tremor  POST-OPERATIVE DIAGNOSIS:  Same PROCEDURE:  Procedure(s): CANCELLED PROCEDURE  SURGEON:  Surgeon(s) and Role:    Erline Levine, MD - Primary  PHYSICIAN ASSISTANT:   ASSISTANTS: none   ANESTHESIA:   general  EBL:  None   BLOOD ADMINISTERED:none  DRAINS: none   LOCAL MEDICATIONS USED:  NONE  SPECIMEN:  No Specimen  DISPOSITION OF SPECIMEN:  N/A  COUNTS:  YES  TOURNIQUET:  * No tourniquets in log *  DICTATION: Patient was brought to OR for IPG placement.  Staples were removed from scalp incision and purulent material was expressed from a few of the staple holes.  At this point, I was concerned that there was risk of infection with placement of IPG, so I elected to cancel the surgery.  Patient will be discharged home on oral antibiotics and rescheduled after surgical wound has fully healed.  PLAN OF CARE: Discharge to home after PACU  PATIENT DISPOSITION:  PACU - hemodynamically stable.   Delay start of Pharmacological VTE agent (>24hrs) due to surgical blood loss or risk of bleeding: yes

## 2017-12-22 NOTE — Op Note (Signed)
12/22/2017  8:32 AM  PATIENT:  Carlos Hunt  72 y.o. male  PRE-OPERATIVE DIAGNOSIS:  Essential tremor  POST-OPERATIVE DIAGNOSIS:  Same PROCEDURE:  Procedure(s): CANCELLED PROCEDURE  SURGEON:  Surgeon(s) and Role:    Erline Levine, MD - Primary  PHYSICIAN ASSISTANT:   ASSISTANTS: none   ANESTHESIA:   general  EBL:  None   BLOOD ADMINISTERED:none  DRAINS: none   LOCAL MEDICATIONS USED:  NONE  SPECIMEN:  No Specimen  DISPOSITION OF SPECIMEN:  N/A  COUNTS:  YES  TOURNIQUET:  * No tourniquets in log *  DICTATION: Patient was brought to OR for IPG placement.  Staples were removed from scalp incision and purulent material was expressed from a few of the staple holes.  At this point, I was concerned that there was risk of infection with placement of IPG, so I elected to cancel the surgery.  Patient will be discharged home on oral antibiotics and rescheduled after surgical wound has fully healed.  PLAN OF CARE: Discharge to home after PACU  PATIENT DISPOSITION:  PACU - hemodynamically stable.   Delay start of Pharmacological VTE agent (>24hrs) due to surgical blood loss or risk of bleeding: yes

## 2017-12-22 NOTE — Anesthesia Procedure Notes (Signed)
Procedure Name: Intubation Date/Time: 12/22/2017 11:46 AM Performed by: Scheryl Darter, CRNA Pre-anesthesia Checklist: Patient identified, Emergency Drugs available, Suction available and Patient being monitored Patient Re-evaluated:Patient Re-evaluated prior to induction Oxygen Delivery Method: Circle System Utilized Preoxygenation: Pre-oxygenation with 100% oxygen Induction Type: IV induction Ventilation: Mask ventilation without difficulty Laryngoscope Size: Mac and 3 Grade View: Grade II Tube type: Oral Tube size: 7.5 mm Number of attempts: 1 Airway Equipment and Method: Stylet and Oral airway Placement Confirmation: ETT inserted through vocal cords under direct vision,  positive ETCO2 and breath sounds checked- equal and bilateral Secured at: 23 cm Tube secured with: Tape Dental Injury: Teeth and Oropharynx as per pre-operative assessment

## 2017-12-22 NOTE — Anesthesia Preprocedure Evaluation (Addendum)
Anesthesia Evaluation  Patient identified by MRN, date of birth, ID band Patient awake    Reviewed: Allergy & Precautions, NPO status , Patient's Chart, lab work & pertinent test results  Airway Mallampati: II  TM Distance: >3 FB Neck ROM: Full    Dental  (+) Teeth Intact, Dental Advisory Given   Pulmonary former smoker,    breath sounds clear to auscultation       Cardiovascular hypertension,  Rhythm:Regular Rate:Normal     Neuro/Psych    GI/Hepatic   Endo/Other    Renal/GU      Musculoskeletal   Abdominal   Peds  Hematology   Anesthesia Other Findings   Reproductive/Obstetrics                            Anesthesia Physical Anesthesia Plan  ASA: III  Anesthesia Plan: General   Post-op Pain Management:    Induction: Intravenous  PONV Risk Score and Plan: Ondansetron and Dexamethasone  Airway Management Planned: Oral ETT  Additional Equipment:   Intra-op Plan:   Post-operative Plan: Extubation in OR  Informed Consent: I have reviewed the patients History and Physical, chart, labs and discussed the procedure including the risks, benefits and alternatives for the proposed anesthesia with the patient or authorized representative who has indicated his/her understanding and acceptance.   Dental advisory given  Plan Discussed with: Anesthesiologist and CRNA  Anesthesia Plan Comments:        Anesthesia Quick Evaluation

## 2017-12-22 NOTE — Anesthesia Postprocedure Evaluation (Signed)
Anesthesia Post Note  Patient: Lateef A Sproles  Procedure(s) Performed: CANCELLED PROCEDURE     Patient location during evaluation: PACU Anesthesia Type: General Level of consciousness: awake and alert Pain management: pain level controlled Vital Signs Assessment: post-procedure vital signs reviewed and stable Respiratory status: spontaneous breathing, nonlabored ventilation, respiratory function stable and patient connected to nasal cannula oxygen Cardiovascular status: blood pressure returned to baseline and stable Postop Assessment: no apparent nausea or vomiting Anesthetic complications: no    Last Vitals:  Vitals:   12/22/17 0858 12/22/17 0900  BP: 137/68   Pulse: 62 62  Resp: 17 15  Temp: (!) 36.4 C   SpO2: 96% 96%    Last Pain:  Vitals:   12/22/17 0858  TempSrc:   PainSc: 0-No pain                 Tamyra Fojtik COKER

## 2017-12-22 NOTE — Discharge Summary (Signed)
Physician Discharge Summary  Patient ID: MONTRAVIOUS WEIGELT MRN: [id number] DOB/AGE: 10-Sep-1945 72 y.o.  Admit date: 12/22/2017 Discharge date: 12/22/2017  Admission Diagnoses:Tremor  Discharge Diagnoses: Same Active Problems:   * No active hospital problems. *   Discharged Condition: good  Hospital Course: Patient was admitted for placement of implantable pulse generator for deep brain stimulator.  After induction of anesthesia and removal of scalp staples, we were able to express a [place] amount of purulent material from the staple sites.  At this point, I felt it would be safest for the patient to cancel the surgery, treat him with oral Keflex and then to plan on placing IPG at a later date.  He was recovered from anesthesia in PACU and discharged home.  Consults: None  Significant Diagnostic Studies: None  Treatments: None  Discharge Exam: Blood pressure 137/68, pulse 62, temperature (!) 97.5 F (36.4 C), resp. rate 15, height 5' 8"  (1.727 m), weight 91.2 kg (201 lb), SpO2 96 %. Neurologic: Alert and oriented X 3, normal strength and tone. Normal symmetric reflexes. Normal coordination and gait Wound:CDI  Disposition: Home   Allergies as of 12/22/2017      Reactions   Procaine Nausea And Vomiting, Other (See Comments)   NOVOCAINE   Sulfacetamide Sodium-sulfur Rash      Medication List    TAKE these medications   aspirin 325 MG tablet Take 325 mg by mouth daily.   B-12 3000 MCG Caps Take 3,000 mcg by mouth daily.   CENTRUM PO Take 1 tablet by mouth daily.   cephALEXin 500 MG capsule Commonly known as:  KEFLEX Take 500 mg by mouth 3 (three) times daily.   ECHINACEA PO Take 334 mg by mouth daily.   fluticasone 50 MCG/ACT nasal spray Commonly known as:  FLONASE Place 1 spray into both nostrils daily as needed for allergies or rhinitis.   hydrochlorothiazide 25 MG tablet Commonly known as:  HYDRODIURIL Take 1 tablet (25 mg total) by mouth daily.    HYDROcodone-acetaminophen 5-325 MG tablet Commonly known as:  NORCO/VICODIN Take 1 tablet by mouth every 6 (six) hours as needed for moderate pain.   losartan 50 MG tablet Commonly known as:  COZAAR Take 1 tablet (50 mg total) by mouth daily.   MSM 1000 MG Caps Take 1,000 mg by mouth daily.   PAPAYA AND ENZYMES PO Take 1 tablet by mouth daily as needed (for heartburn).   VISINE MAXIMUM REDNESS RELIEF OP Place 1 drop into both eyes daily as needed (for irritation).   vitamin C 1000 MG tablet Take 1,000 mg by mouth daily.   Vitamin D3 2000 units Tabs Take 2,000 Units by mouth daily.        Signed: Peggyann Shoals, MD 12/22/2017, 11:26 AM

## 2017-12-22 NOTE — Interval H&P Note (Signed)
History and Physical Interval Note:  12/22/2017 7:27 AM  Carlos Hunt  has presented today for surgery, with the diagnosis of Essential tremor  The various methods of treatment have been discussed with the patient and family. After consideration of risks, benefits and other options for treatment, the patient has consented to  Procedure(s): UNILATERAL PULSE GENERATOR IMPLANT (Left) as a surgical intervention .  The patient's history has been reviewed, patient examined, no change in status, stable for surgery.  I have reviewed the patient's chart and labs.  Questions were answered to the patient's satisfaction.     Dorothe Elmore D

## 2017-12-22 NOTE — Transfer of Care (Signed)
Immediate Anesthesia Transfer of Care Note  Patient: Prentis A Boger  Procedure(s) Performed: CANCELLED PROCEDURE  Patient Location: PACU  Anesthesia Type:General  Level of Consciousness: awake, alert , oriented and sedated  Airway & Oxygen Therapy: Patient Spontanous Breathing and Patient connected to nasal cannula oxygen  Post-op Assessment: Report given to RN, Post -op Vital signs reviewed and stable and Patient moving all extremities  Post vital signs: Reviewed and stable  Last Vitals:  Vitals Value Taken Time  BP    Temp 36.4 C 12/22/2017  8:28 AM  Pulse 85 12/22/2017  8:36 AM  Resp 18 12/22/2017  8:36 AM  SpO2 97 % 12/22/2017  8:36 AM  Vitals shown include unvalidated device data.  Last Pain:  Vitals:   12/22/17 0708  TempSrc: Oral  PainSc: 0-No pain      Patients Stated Pain Goal: 3 (23/53/61 4431)  Complications: No apparent anesthesia complications

## 2017-12-23 ENCOUNTER — Other Ambulatory Visit: Payer: Self-pay | Admitting: Family Medicine

## 2017-12-23 DIAGNOSIS — N281 Cyst of kidney, acquired: Secondary | ICD-10-CM

## 2017-12-23 DIAGNOSIS — D1771 Benign lipomatous neoplasm of kidney: Secondary | ICD-10-CM

## 2017-12-26 NOTE — H&P (Signed)
Patient ID:   [id number] Patient: Carlos Hunt  Date of Birth: 1946-07-06 Visit Type: Office Visit   Date: 05/19/2017 03:15 PM Provider: Marchia Meiers. Vertell Limber MD   This 72 year old male presents for follow up.   History of Present Illness: 1.  follow up  03/11/2017 left DBS removal  Patient returns as planned, pleased with his current status.  IV antibiotics have completed.  He is to see [name] next week.  He has passed his speech and physical therapy tests.   The patient is doing well.  He is not having any symptoms related to his brain abscess .  This appears to be resolved.  His baseline  tremor has returned   On the right and his tremor remains resolved  on the left.    The patient is interested in going ahead  with repeat deep brain stimulator  placement once he is cleared  from infection issues.  He is going to see Dr. Carles Collet and will follow up with   me after that.           PAST MEDICAL/SURGICAL HISTORY   (Reviewed, updated)  Disease/disorder Onset Date Management Date Comments    left DBS removal 03/11/2017     bilateral implanted pulse generator placement w/extensions 10/23/2016     bilateral deep brain stimulator for essential tremor 10/07/2016     fiducial placement 09/30/2016   Decreased vitamin D    [facility] 06/20/2016 -  GERD      Hand fracture    [facility] 06/20/2016 -  Hypertension      Tremor          [facility] 06/20/2016 -     PAST MEDICAL HISTORY, SURGICAL HISTORY, FAMILY HISTORY, SOCIAL HISTORY AND REVIEW OF SYSTEMS I have reviewed the patient's past medical, surgical, family and social history as well as the comprehensive review of systems as included on the [facility] history form dated 07/02/2016, which I have signed.  Family History: Reviewed, no changes.  Last detailed document date:07/02/2016.   Social History: Reviewed, no changes. Last detailed document date: 07/02/2016.    MEDICATIONS(added, continued or stopped this  visit): Started Medication Directions Instruction Stopped   aspirin  328m ORAL 1 by mouth daily     Glucosamine 500 mg tablet      hydrochlorothiazide 25 mg tablet take 1 tablet by oral route  every day     Intrinsi B12-Folate 500 mcg-20 mg-800 mcg tablet      lisinopril 10 mg tablet take 1 tablet by oral route  every day     primidone 50 mg tablet take 5 tablet by oral route 3 times every day     tramadol 50 mg tablet take 1 tablet by oral route  every 6 hours as needed  05/19/2017     ALLERGIES: Ingredient Reaction Medication Name Comment  PROCAINE HCL  Novocain    Reviewed, no changes.    Vitals Date Temp F BP Pulse Ht In Wt Lb BMI BSA Pain Score  05/19/2017  137/74 76 71 213 29.71  0/10      IMPRESSION  Essential tremor   with brain abscess  Completed Orders (this encounter) Order Details Reason Side Interpretation Result Initial Treatment Date Region  Hypertension education Patient to follow up with primary care provider.        Dietary management education, guidance, and counseling patient encouraged to eat a well balanced diet  Assessment/Plan # Detail Type Description   1. Assessment Brain abscess (G06.0).       2. Assessment Essential tremor (G25.0).       3. Assessment Essential (primary) hypertension (I10).       4. Assessment Body mass index (BMI) 29.0-29.9, adult (G64.84).   Plan Orders Today's instructions / counseling include(s) Dietary management education, guidance, and counseling.           Pain Management Plan Pain Scale: 0/10. Method: Numeric Pain Intensity Scale. Onset: 09/30/2016.    follow-up as needed  Orders: Instruction(s)/Education: Assessment Instruction  I10 Hypertension education  Z68.29 Dietary management education, guidance, and counseling             Provider:  Vertell Limber MD, Marchia Meiers 05/19/2017 5:11 PM  Dictation edited by: Marchia Meiers. Vertell Limber    CC Providers: Wells Guiles Tat  [address] [place], [place]  [address]              Electronically signed by Marchia Meiers Vertell Limber MD on 05/19/2017 05:11 PM

## 2018-01-04 ENCOUNTER — Other Ambulatory Visit: Payer: Self-pay | Admitting: Family Medicine

## 2018-01-04 ENCOUNTER — Telehealth: Payer: Self-pay

## 2018-01-04 NOTE — Telephone Encounter (Signed)
Please advise on alternative for losartan.

## 2018-01-04 NOTE — Telephone Encounter (Signed)
Copied from [place] [id number]. Topic: Inquiry >> Jan 04, 2018  9:04 AM Pricilla Handler wrote: Reason for CRM: Patient called requesting a call back from Dr. Tamala Julian or someone in the office. The patient's Losartan (COZAAR) 50 MG tablet has been recalled, and the patient would like a replacement. Please call at [phone] or [phone].         Thank You!!!

## 2018-01-05 MED ORDER — IRBESARTAN 150 MG PO TABS: 150.0000 mg | ORAL_TABLET | Freq: Every day | ORAL | 1 refills | Status: DC

## 2018-01-05 NOTE — Telephone Encounter (Signed)
Call --- [facility] 183m [facility] [place]artan.

## 2018-01-05 NOTE — Telephone Encounter (Signed)
Left detailed VM advising pt.

## 2018-01-09 ENCOUNTER — Encounter (HOSPITAL_COMMUNITY): Payer: Self-pay | Admitting: *Deleted

## 2018-01-11 ENCOUNTER — Other Ambulatory Visit: Payer: Self-pay | Admitting: Neurosurgery

## 2018-01-12 ENCOUNTER — Inpatient Hospital Stay (HOSPITAL_COMMUNITY): Payer: Medicare Other | Admitting: Certified Registered Nurse Anesthetist

## 2018-01-12 ENCOUNTER — Inpatient Hospital Stay (HOSPITAL_COMMUNITY)
Admission: RE | Admit: 2018-01-12 | Discharge: 2018-01-12 | DRG: 027 | Disposition: A | Discharge status: Home / Self Care | Payer: Medicare Other | Source: Ambulatory Visit | Attending: Neurosurgery | Admitting: Neurosurgery

## 2018-01-12 ENCOUNTER — Encounter (HOSPITAL_COMMUNITY)
Admission: RE | Disposition: A | Discharge status: Home / Self Care | Payer: Self-pay | Source: Ambulatory Visit | Attending: Neurosurgery

## 2018-01-12 ENCOUNTER — Encounter (HOSPITAL_COMMUNITY): Payer: Self-pay | Admitting: *Deleted

## 2018-01-12 DIAGNOSIS — I10 Essential (primary) hypertension: Secondary | ICD-10-CM | POA: Diagnosis present

## 2018-01-12 DIAGNOSIS — K4090 Unilateral inguinal hernia, without obstruction or gangrene, not specified as recurrent: Secondary | ICD-10-CM | POA: Diagnosis not present

## 2018-01-12 DIAGNOSIS — Z882 Allergy status to sulfonamides status: Secondary | ICD-10-CM | POA: Diagnosis not present

## 2018-01-12 DIAGNOSIS — Z87891 Personal history of nicotine dependence: Secondary | ICD-10-CM

## 2018-01-12 DIAGNOSIS — K219 Gastro-esophageal reflux disease without esophagitis: Secondary | ICD-10-CM | POA: Diagnosis present

## 2018-01-12 DIAGNOSIS — R251 Tremor, unspecified: Secondary | ICD-10-CM | POA: Diagnosis present

## 2018-01-12 DIAGNOSIS — G250 Essential tremor: Secondary | ICD-10-CM | POA: Diagnosis not present

## 2018-01-12 DIAGNOSIS — G060 Intracranial abscess and granuloma: Secondary | ICD-10-CM | POA: Diagnosis not present

## 2018-01-12 HISTORY — DX: Abnormal levels of other serum enzymes: R74.8

## 2018-01-12 HISTORY — PX: PULSE GENERATOR IMPLANT: SHX5370

## 2018-01-12 LAB — COMPREHENSIVE METABOLIC PANEL
ALT: 39 U/L (ref 17–63)
AST: 32 U/L (ref 15–41)
Albumin: 3.9 g/dL (ref 3.5–5.0)
Alkaline Phosphatase: 70 U/L (ref 38–126)
Anion gap: 12 (ref 5–15)
BUN: 16 mg/dL (ref 6–20)
CO2: 25 mmol/L (ref 22–32)
Calcium: 9.2 mg/dL (ref 8.9–10.3)
Chloride: 102 mmol/L (ref 101–111)
Creatinine, Ser: 1.14 mg/dL (ref 0.61–1.24)
GFR calc Af Amer: >60 mL/min (ref >60)
GFR calc non Af Amer: >60 mL/min (ref >60)
Glucose, Bld: 94 mg/dL (ref 65–99)
Potassium: 4.2 mmol/L (ref 3.5–5.1)
Sodium: 139 mmol/L (ref 135–145)
Total Bilirubin: 0.7 mg/dL (ref 0.3–1.2)
Total Protein: 6.6 g/dL (ref 6.5–8.1)

## 2018-01-12 LAB — CBC
HCT: 43.9 % (ref 39.0–52.0)
Hemoglobin: 15.1 g/dL (ref 13.0–17.0)
MCH: 31.4 pg (ref 26.0–34.0)
MCHC: 34.4 g/dL (ref 30.0–36.0)
MCV: 91.3 fL (ref 78.0–100.0)
Platelets: 284 10*3/uL (ref 150–400)
RBC: 4.81 MIL/uL (ref 4.22–5.81)
RDW: 13.1 % (ref 11.5–15.5)
WBC: 8.7 10*3/uL (ref 4.0–10.5)

## 2018-01-12 SURGERY — UNILATERAL PULSE GENERATOR IMPLANT
Anesthesia: General | Site: Head | Laterality: Left

## 2018-01-12 MED ORDER — HYDROMORPHONE HCL 2 MG/ML IJ SOLN: 0.2500 mg | INTRAMUSCULAR | Status: DC | PRN

## 2018-01-12 MED ORDER — SODIUM CHLORIDE 0.9% FLUSH: 3.0000 mL | INTRAVENOUS | Status: DC | PRN

## 2018-01-12 MED ORDER — VANCOMYCIN HCL 1000 MG IV SOLR
INTRAVENOUS | Status: AC
Start: 2018-01-12 — End: ?
  Filled 2018-01-12: qty 1000

## 2018-01-12 MED ORDER — MIDAZOLAM HCL 2 MG/2ML IJ SOLN
INTRAMUSCULAR | Status: AC
  Filled 2018-01-12: qty 2

## 2018-01-12 MED ORDER — BACITRACIN ZINC 500 UNIT/GM EX OINT
TOPICAL_OINTMENT | CUTANEOUS | Status: DC | PRN
  Administered 2018-01-12: 1 via TOPICAL

## 2018-01-12 MED ORDER — ACETAMINOPHEN 650 MG RE SUPP: 650.0000 mg | RECTAL | Status: DC | PRN

## 2018-01-12 MED ORDER — ONDANSETRON HCL 4 MG/2ML IJ SOLN: 4.0000 mg | Freq: Four times a day (QID) | INTRAMUSCULAR | Status: DC | PRN

## 2018-01-12 MED ORDER — LIDOCAINE-EPINEPHRINE 1 %-1:100000 IJ SOLN
INTRAMUSCULAR | Status: DC | PRN
  Administered 2018-01-12: 9.5 mL

## 2018-01-12 MED ORDER — MORPHINE SULFATE (PF) 4 MG/ML IV SOLN: 2.0000 mg | INTRAVENOUS | Status: DC | PRN

## 2018-01-12 MED ORDER — VANCOMYCIN HCL 1000 MG IV SOLR
INTRAVENOUS | Status: DC | PRN
  Administered 2018-01-12: 1000 mg via TOPICAL

## 2018-01-12 MED ORDER — BUPIVACAINE HCL (PF) 0.5 % IJ SOLN
INTRAMUSCULAR | Status: DC | PRN
  Administered 2018-01-12: 9.5 mL

## 2018-01-12 MED ORDER — DEXAMETHASONE SODIUM PHOSPHATE 10 MG/ML IJ SOLN
INTRAMUSCULAR | Status: AC
  Filled 2018-01-12: qty 1

## 2018-01-12 MED ORDER — ACETAMINOPHEN 325 MG PO TABS: 650.0000 mg | ORAL_TABLET | ORAL | Status: DC | PRN

## 2018-01-12 MED ORDER — NAPHAZOLINE-GLYCERIN 0.012-0.2 % OP SOLN
1.0000 [drp] | Freq: Four times a day (QID) | OPHTHALMIC | Status: DC | PRN
  Filled 2018-01-12: qty 15

## 2018-01-12 MED ORDER — VANCOMYCIN HCL IN DEXTROSE 1-5 GM/200ML-% IV SOLN
INTRAVENOUS | Status: AC
  Filled 2018-01-12: qty 200

## 2018-01-12 MED ORDER — 0.9 % SODIUM CHLORIDE (POUR BTL) OPTIME
TOPICAL | Status: DC | PRN
  Administered 2018-01-12: 1000 mL

## 2018-01-12 MED ORDER — KCL IN DEXTROSE-NACL 20-5-0.45 MEQ/L-%-% IV SOLN: INTRAVENOUS | Status: DC

## 2018-01-12 MED ORDER — ONDANSETRON HCL 4 MG/2ML IJ SOLN
INTRAMUSCULAR | Status: AC
  Filled 2018-01-12: qty 2

## 2018-01-12 MED ORDER — BISACODYL 10 MG RE SUPP: 10.0000 mg | Freq: Every day | RECTAL | Status: DC | PRN

## 2018-01-12 MED ORDER — LIDOCAINE 2% (20 MG/ML) 5 ML SYRINGE
INTRAMUSCULAR | Status: AC
  Filled 2018-01-12: qty 5

## 2018-01-12 MED ORDER — POLYETHYLENE GLYCOL 3350 17 G PO PACK: 17.0000 g | PACK | Freq: Every day | ORAL | Status: DC | PRN

## 2018-01-12 MED ORDER — TETRAHYDROZ-GLYC-HYPROM-PEG 0.05-0.2-0.36-1 % OP SOLN: Freq: Every day | OPHTHALMIC | Status: DC | PRN

## 2018-01-12 MED ORDER — SODIUM CHLORIDE 0.9% FLUSH
3.0000 mL | Freq: Two times a day (BID) | INTRAVENOUS | Status: DC
  Administered 2018-01-12: 3 mL via INTRAVENOUS

## 2018-01-12 MED ORDER — LACTATED RINGERS IV SOLN
INTRAVENOUS | Status: DC
  Administered 2018-01-12: 08:00:00 via INTRAVENOUS

## 2018-01-12 MED ORDER — MENTHOL 3 MG MT LOZG: 1.0000 | LOZENGE | OROMUCOSAL | Status: DC | PRN

## 2018-01-12 MED ORDER — FLUTICASONE PROPIONATE 50 MCG/ACT NA SUSP
1.0000 | Freq: Every day | NASAL | Status: DC | PRN
  Filled 2018-01-12: qty 16

## 2018-01-12 MED ORDER — LIDOCAINE-EPINEPHRINE 1 %-1:100000 IJ SOLN
INTRAMUSCULAR | Status: AC
  Filled 2018-01-12: qty 1

## 2018-01-12 MED ORDER — MEPERIDINE HCL 50 MG/ML IJ SOLN: 6.2500 mg | INTRAMUSCULAR | Status: DC | PRN

## 2018-01-12 MED ORDER — SUGAMMADEX SODIUM 200 MG/2ML IV SOLN
INTRAVENOUS | Status: DC | PRN
  Administered 2018-01-12: 200 mg via INTRAVENOUS

## 2018-01-12 MED ORDER — HYDROCHLOROTHIAZIDE 25 MG PO TABS
25.0000 mg | ORAL_TABLET | Freq: Every day | ORAL | Status: DC
  Administered 2018-01-12: 25 mg via ORAL
  Filled 2018-01-12: qty 1

## 2018-01-12 MED ORDER — DEXAMETHASONE SODIUM PHOSPHATE 10 MG/ML IJ SOLN
INTRAMUSCULAR | Status: DC | PRN
  Administered 2018-01-12: 10 mg via INTRAVENOUS

## 2018-01-12 MED ORDER — FLEET ENEMA 7-19 GM/118ML RE ENEM: 1.0000 | ENEMA | Freq: Once | RECTAL | Status: DC | PRN

## 2018-01-12 MED ORDER — ROCURONIUM BROMIDE 100 MG/10ML IV SOLN
INTRAVENOUS | Status: DC | PRN
  Administered 2018-01-12: 40 mg via INTRAVENOUS

## 2018-01-12 MED ORDER — HYDROCODONE-ACETAMINOPHEN 7.5-325 MG PO TABS: 1.0000 | ORAL_TABLET | Freq: Once | ORAL | Status: DC | PRN

## 2018-01-12 MED ORDER — HYDROCODONE-ACETAMINOPHEN 5-325 MG PO TABS: 2.0000 | ORAL_TABLET | ORAL | Status: DC | PRN

## 2018-01-12 MED ORDER — FENTANYL CITRATE (PF) 250 MCG/5ML IJ SOLN
INTRAMUSCULAR | Status: AC
  Filled 2018-01-12: qty 5

## 2018-01-12 MED ORDER — LIDOCAINE HCL (CARDIAC) PF 100 MG/5ML IV SOSY
PREFILLED_SYRINGE | INTRAVENOUS | Status: DC | PRN
  Administered 2018-01-12: 100 mg via INTRAVENOUS

## 2018-01-12 MED ORDER — FAMOTIDINE IN NACL 20-0.9 MG/50ML-% IV SOLN: 20.0000 mg | Freq: Two times a day (BID) | INTRAVENOUS | Status: DC

## 2018-01-12 MED ORDER — CEFAZOLIN SODIUM-DEXTROSE 2-4 GM/100ML-% IV SOLN
2.0000 g | Freq: Three times a day (TID) | INTRAVENOUS | Status: DC
  Administered 2018-01-12: 2 g via INTRAVENOUS
  Filled 2018-01-12: qty 100

## 2018-01-12 MED ORDER — ONDANSETRON HCL 4 MG PO TABS
4.0000 mg | ORAL_TABLET | Freq: Four times a day (QID) | ORAL | Status: DC | PRN
Start: 2018-01-12 — End: 2018-01-12

## 2018-01-12 MED ORDER — CEFAZOLIN SODIUM-DEXTROSE 2-4 GM/100ML-% IV SOLN
2.0000 g | INTRAVENOUS | Status: AC
  Administered 2018-01-12: 2 g via INTRAVENOUS
  Filled 2018-01-12: qty 100

## 2018-01-12 MED ORDER — PHENOL 1.4 % MT LIQD: 1.0000 | OROMUCOSAL | Status: DC | PRN

## 2018-01-12 MED ORDER — ROCURONIUM BROMIDE 50 MG/5ML IV SOLN
INTRAVENOUS | Status: AC
  Filled 2018-01-12: qty 2

## 2018-01-12 MED ORDER — ROCURONIUM BROMIDE 50 MG/5ML IV SOLN
INTRAVENOUS | Status: AC
  Filled 2018-01-12: qty 1

## 2018-01-12 MED ORDER — PROPOFOL 10 MG/ML IV BOLUS
INTRAVENOUS | Status: DC | PRN
  Administered 2018-01-12: 150 mg via INTRAVENOUS

## 2018-01-12 MED ORDER — PROPOFOL 10 MG/ML IV BOLUS
INTRAVENOUS | Status: AC
  Filled 2018-01-12: qty 20

## 2018-01-12 MED ORDER — DOCUSATE SODIUM 100 MG PO CAPS: 100.0000 mg | ORAL_CAPSULE | Freq: Two times a day (BID) | ORAL | Status: DC

## 2018-01-12 MED ORDER — CHLORHEXIDINE GLUCONATE CLOTH 2 % EX PADS: 6.0000 | MEDICATED_PAD | Freq: Once | CUTANEOUS | Status: DC

## 2018-01-12 MED ORDER — IRBESARTAN 150 MG PO TABS
150.0000 mg | ORAL_TABLET | Freq: Every day | ORAL | Status: DC
  Administered 2018-01-12: 150 mg via ORAL
  Filled 2018-01-12: qty 1

## 2018-01-12 MED ORDER — ZOLPIDEM TARTRATE 5 MG PO TABS: 5.0000 mg | ORAL_TABLET | Freq: Every evening | ORAL | Status: DC | PRN

## 2018-01-12 MED ORDER — ALUM & MAG HYDROXIDE-SIMETH 200-200-20 MG/5ML PO SUSP: 30.0000 mL | Freq: Four times a day (QID) | ORAL | Status: DC | PRN

## 2018-01-12 MED ORDER — ACETAMINOPHEN 10 MG/ML IV SOLN: 1000.0000 mg | Freq: Once | INTRAVENOUS | Status: DC | PRN

## 2018-01-12 MED ORDER — VANCOMYCIN HCL 1000 MG IV SOLR
INTRAVENOUS | Status: DC | PRN
  Administered 2018-01-12: 1000 mg via INTRAVENOUS

## 2018-01-12 MED ORDER — PROMETHAZINE HCL 25 MG/ML IJ SOLN: 6.2500 mg | INTRAMUSCULAR | Status: DC | PRN

## 2018-01-12 MED ORDER — BUPIVACAINE HCL (PF) 0.5 % IJ SOLN
INTRAMUSCULAR | Status: AC
  Filled 2018-01-12: qty 30

## 2018-01-12 MED ORDER — FENTANYL CITRATE (PF) 100 MCG/2ML IJ SOLN
INTRAMUSCULAR | Status: DC | PRN
  Administered 2018-01-12: 100 ug via INTRAVENOUS
  Administered 2018-01-12: 50 ug via INTRAVENOUS

## 2018-01-12 MED ORDER — BACITRACIN ZINC 500 UNIT/GM EX OINT
TOPICAL_OINTMENT | CUTANEOUS | Status: AC
  Filled 2018-01-12: qty 28.35

## 2018-01-12 MED ORDER — OXYCODONE HCL 5 MG PO TABS: 5.0000 mg | ORAL_TABLET | ORAL | Status: DC | PRN

## 2018-01-12 SURGICAL SUPPLY — 40 items
BANDAGE ADH SHEER 1  50/CT (GAUZE/BANDAGES/DRESSINGS) IMPLANT
BLADE CLIPPER SURG (BLADE) ×3 IMPLANT
BLADE SURG 11 STRL SS (BLADE) ×6 IMPLANT
BOOT SUTURE AID YELLOW STND (SUTURE) ×3 IMPLANT
CANISTER SUCT 3000ML PPV (MISCELLANEOUS) ×3 IMPLANT
CLIP RANEY DISP (INSTRUMENTS) IMPLANT
COIL EXT DBS STRETCH 40 (Lead) ×3 IMPLANT
COVER BACK TABLE 60X90IN (DRAPES) IMPLANT
DECANTER SPIKE VIAL GLASS SM (MISCELLANEOUS) ×3 IMPLANT
DERMABOND ADVANCED (GAUZE/BANDAGES/DRESSINGS) ×2
DERMABOND ADVANCED .7 DNX12 (GAUZE/BANDAGES/DRESSINGS) ×1 IMPLANT
DRAPE C-ARM 42X72 X-RAY (DRAPES) IMPLANT
DRAPE CAMERA CLOSED 9X96 (DRAPES) IMPLANT
DRAPE CAMERA VIDEO/LASER (DRAPES) ×3 IMPLANT
DRSG OPSITE POSTOP 3X4 (GAUZE/BANDAGES/DRESSINGS) ×3 IMPLANT
DRSG OPSITE POSTOP 4X6 (GAUZE/BANDAGES/DRESSINGS) ×3 IMPLANT
DRSG TELFA 3X8 NADH (GAUZE/BANDAGES/DRESSINGS) IMPLANT
DURAPREP 26ML APPLICATOR (WOUND CARE) ×3 IMPLANT
GAUZE SPONGE 4X4 12PLY STRL (GAUZE/BANDAGES/DRESSINGS) IMPLANT
GAUZE SPONGE 4X4 16PLY XRAY LF (GAUZE/BANDAGES/DRESSINGS) IMPLANT
GLOVE BIO SURGEON STRL SZ8 (GLOVE) ×6 IMPLANT
GLOVE ECLIPSE 8.0 STRL XLNG CF (GLOVE) ×3 IMPLANT
GLOVE INDICATOR 8.0 STRL GRN (GLOVE) ×3 IMPLANT
GLOVE INDICATOR 8.5 STRL (GLOVE) ×3 IMPLANT
KIT BASIN OR (CUSTOM PROCEDURE TRAY) ×3 IMPLANT
KIT REMOVER STAPLE SKIN (MISCELLANEOUS) IMPLANT
MARKER SKIN DUAL TIP RULER LAB (MISCELLANEOUS) IMPLANT
NEEDLE HYPO 25X1 1.5 SAFETY (NEEDLE) ×3 IMPLANT
NEEDLE SPNL 18GX3.5 QUINCKE PK (NEEDLE) IMPLANT
NEEDLE SPNL 22GX3.5 QUINCKE BK (NEEDLE) IMPLANT
NEUROSTIM OCTOPOLAR ~~LOC~~ 60X55 (Neuro Prosthesis/Implant) ×3 IMPLANT
NEUROSTIM PROGRAMMER 2.2X3.7 (NEUROSURGERY SUPPLIES) ×3 IMPLANT
PACK LAMINECTOMY NEURO (CUSTOM PROCEDURE TRAY) ×3 IMPLANT
PAD ARMBOARD 7.5X6 YLW CONV (MISCELLANEOUS) ×6 IMPLANT
STAPLER SKIN PROX WIDE 3.9 (STAPLE) ×3 IMPLANT
SUT ETHILON 3 0 PS 1 (SUTURE) ×3 IMPLANT
SUT SILK 2 0 TIES 10X30 (SUTURE) ×3 IMPLANT
SUT VIC AB 2-0 CP2 18 (SUTURE) ×3 IMPLANT
SUT VIC AB 3-0 SH 8-18 (SUTURE) ×3 IMPLANT
TOOL TUNNELING (INSTRUMENTS) ×3 IMPLANT

## 2018-01-12 NOTE — Anesthesia Procedure Notes (Signed)
Procedure Name: Intubation Date/Time: 01/12/2018 10:25 AM Performed by: Colin Benton, CRNA Pre-anesthesia Checklist: Patient identified, Emergency Drugs available, Suction available, Patient being monitored and Timeout performed Patient Re-evaluated:Patient Re-evaluated prior to induction Oxygen Delivery Method: Circle system utilized Preoxygenation: Pre-oxygenation with 100% oxygen Induction Type: IV induction Ventilation: Mask ventilation without difficulty and Oral airway inserted - appropriate to patient size Laryngoscope Size: Mac and 4 Grade View: Grade I Tube type: Oral Tube size: 7.5 mm Number of attempts: 1 Airway Equipment and Method: Patient positioned with wedge pillow and Stylet Placement Confirmation: ETT inserted through vocal cords under direct vision,  positive ETCO2 and breath sounds checked- equal and bilateral Secured at: 22 cm Tube secured with: Tape Dental Injury: Teeth and Oropharynx as per pre-operative assessment  Comments: ETT placed by Chrissie Noa

## 2018-01-12 NOTE — Op Note (Signed)
01/12/2018  11:18 AM  PATIENT:  Carlos Hunt  72 y.o. male  PRE-OPERATIVE DIAGNOSIS:  ESSENTIAL TREMOR  POST-OPERATIVE DIAGNOSIS: ESSENTIAL TREMOR   PROCEDURE:  Procedure(s) with comments: UNILATERAL PULSE GENERATOR IMPLANT AND EXTENTIONS FOR DEEP BRAIN STIMULATOR (Left) - UNILATERAL PULSE GENERATOR IMPLANT AND EXTENTIONS FOR DEEP BRAIN STIMULATOR  SURGEON:  Surgeon(s) and Role:    * Erline Levine, MD - Primary  PHYSICIAN ASSISTANT:   ASSISTANTS: [place], RN   ANESTHESIA:   general  EBL:  Minimal  BLOOD ADMINISTERED:none  DRAINS: none   LOCAL MEDICATIONS USED:  MARCAINE    and LIDOCAINE   SPECIMEN:  No Specimen  DISPOSITION OF SPECIMEN:  N/A  COUNTS:  YES  TOURNIQUET:  * No tourniquets in log *  DICTATION: DICTATION: Patient has implanted bilateral VIM stimulator electrodes having recently completed DBS Stage I and now presents for placement of lead extensions and IPG implantation on the left.  (His right sided IPG has already been implanted).  PROCEDURE: Patient was brought to the operating room and GETA anesthesia was induced.  Operation was performed on the left. Left upper chest, scalp, neck were prepped with betadine scrub and Duraprep.  Area of planned incision was infiltrated with lidocaine.  Scalp incision was made and the lead was exposed. An incision was made in the left upper chest and a pocket was created.  Extension tunnel was made from scalp to pocket.   IPG was placed and attached to lead extension, which in turn was connected to cranial lead and torqued appropriately.  Covering boot was placed.   The IPG  was placed in the pocket. Impedences were checked.  Wounds were irrigated with vancomycin.  Incisions were closed with 2-0 Vicryl and 3-0 vicryl sutures at the pocket and 2-0 vicryl at the scalp with staples and dressed with a sterile occlusive dressing.  Counts were correct at the end of the case.  Patient was extubated and taken to Recovery in stable and  satisfactory condition, having tolerated his operation well.   PLAN OF CARE: Admit for overnight observation  PATIENT DISPOSITION:  PACU - hemodynamically stable.   Delay start of Pharmacological VTE agent (>24hrs) due to surgical blood loss or risk of bleeding: yes

## 2018-01-12 NOTE — Progress Notes (Signed)
Patient alert and oriented, mae's well, voiding adequate amount of urine, swallowing without difficulty, no c/o pain at time of discharge. Patient discharged home with family. Script and discharged instructions given to patient. Patient and family stated understanding of instructions given. Patient has an appointment with Dr.Stern

## 2018-01-12 NOTE — Discharge Summary (Signed)
Physician Discharge Summary  Patient ID: Carlos Hunt MRN: [id number] DOB/AGE: [birth year] 72 y.o.  Admit date: 01/12/2018 Discharge date: 01/12/2018  Admission Diagnoses:Tremor  Discharge Diagnoses: Same Active Problems:   Tremor   Discharged Condition: good  Hospital Course: Patient underwent left deep brain stimulator extension and implantable pulse generator placement, from which he did well.  He was discharged home on the afternoon of surgery.    Consults: None  Significant Diagnostic Studies: None  Treatments: surgery: Patient underwent left deep brain stimulator extension and implantable pulse generator placement  Discharge Exam: Blood pressure (!) 165/69, pulse 72, temperature 97.8 F (36.6 C), temperature source Oral, resp. rate 19, height 5' 8"  (1.727 m), weight 91.2 kg (201 lb), SpO2 99 %. Neurologic: Alert and oriented X 3, normal strength and tone. Normal symmetric reflexes. Normal coordination and gait Wound:CDI  Disposition: Home  Discharge Instructions    Diet - low sodium heart healthy   Complete by:  As directed    Increase activity slowly   Complete by:  As directed      Allergies as of 01/12/2018      Reactions   Procaine Nausea And Vomiting, Other (See Comments)   NOVOCAINE   Sulfacetamide Sodium-sulfur Rash      Medication List    TAKE these medications   aspirin 325 MG tablet Take 325 mg by mouth daily.   B-12 3000 MCG Caps Take 3,000 mcg by mouth daily.   CENTRUM PO Take 1 tablet by mouth daily.   cephALEXin 500 MG capsule Commonly known as:  KEFLEX Take 500 mg by mouth 3 (three) times daily.   ECHINACEA PO Take 334 mg by mouth daily.   fluticasone 50 MCG/ACT nasal spray Commonly known as:  FLONASE Place 1 spray into both nostrils daily as needed for allergies or rhinitis.   hydrochlorothiazide 25 MG tablet Commonly known as:  HYDRODIURIL Take 1 tablet (25 mg total) by mouth daily.   irbesartan 150 MG tablet Commonly known as:   AVAPRO Take 1 tablet (150 mg total) by mouth daily.   MSM 1000 MG Caps Take 1,000 mg by mouth daily.   PAPAYA AND ENZYMES PO Take 1 tablet by mouth daily as needed (for heartburn).   VISINE MAXIMUM REDNESS RELIEF OP Place 1 drop into both eyes daily as needed (for irritation).   vitamin C 1000 MG tablet Take 1,000 mg by mouth daily.   Vitamin D3 2000 units Tabs Take 2,000 Units by mouth daily.        Signed: Peggyann Shoals, MD 01/12/2018, 5:28 PM

## 2018-01-12 NOTE — Discharge Instructions (Signed)
Wound Care Leave incision open to air. You may shower. Do not scrub directly on incision.  Do not put any creams, lotions, or ointments on incision. Activity Walk each and every day, increasing distance each day. No lifting greater than 5 lbs.  Avoid excessive neck motion.  Diet Resume your normal diet.  Return to Work Will be discussed at you follow up appointment. Call Your Doctor If Any of These Occur Redness, drainage, or swelling at the wound.  Temperature greater than 101 degrees. Severe pain not relieved by pain medication. Increased difficulty swallowing. Incision starts to come apart. Follow Up Appt Call today for appointment  [phone]) or for problems.

## 2018-01-12 NOTE — Brief Op Note (Signed)
01/12/2018  11:18 AM  PATIENT:  Carlos Hunt  72 y.o. male  PRE-OPERATIVE DIAGNOSIS:  ESSENTIAL TREMOR  POST-OPERATIVE DIAGNOSIS: ESSENTIAL TREMOR   PROCEDURE:  Procedure(s) with comments: UNILATERAL PULSE GENERATOR IMPLANT AND EXTENTIONS FOR DEEP BRAIN STIMULATOR (Left) - UNILATERAL PULSE GENERATOR IMPLANT AND EXTENTIONS FOR DEEP BRAIN STIMULATOR  SURGEON:  Surgeon(s) and Role:    * Erline Levine, MD - Primary  PHYSICIAN ASSISTANT:   ASSISTANTS: Poteat, RN   ANESTHESIA:   general  EBL:  Minimal  BLOOD ADMINISTERED:none  DRAINS: none   LOCAL MEDICATIONS USED:  MARCAINE    and LIDOCAINE   SPECIMEN:  No Specimen  DISPOSITION OF SPECIMEN:  N/A  COUNTS:  YES  TOURNIQUET:  * No tourniquets in log *  DICTATION: DICTATION: Patient has implanted bilateral VIM stimulator electrodes having recently completed DBS Stage I and now presents for placement of lead extensions and IPG implantation on the left.  (His right sided IPG has already been implanted).  PROCEDURE: Patient was brought to the operating room and GETA anesthesia was induced.  Operation was performed on the left. Left upper chest, scalp, neck were prepped with betadine scrub and Duraprep.  Area of planned incision was infiltrated with lidocaine.  Scalp incision was made and the lead was exposed. An incision was made in the left upper chest and a pocket was created.  Extension tunnel was made from scalp to pocket.   IPG was placed and attached to lead extension, which in turn was connected to cranial lead and torqued appropriately.  Covering boot was placed.   The IPG  was placed in the pocket. Impedences were checked.  Wounds were irrigated with vancomycin.  Incisions were closed with 2-0 Vicryl and 3-0 vicryl sutures at the pocket and 2-0 vicryl at the scalp with staples and dressed with a sterile occlusive dressing.  Counts were correct at the end of the case.  Patient was extubated and taken to Recovery in stable and  satisfactory condition, having tolerated his operation well.   PLAN OF CARE: Admit for overnight observation  PATIENT DISPOSITION:  PACU - hemodynamically stable.   Delay start of Pharmacological VTE agent (>24hrs) due to surgical blood loss or risk of bleeding: yes

## 2018-01-12 NOTE — Anesthesia Postprocedure Evaluation (Signed)
Anesthesia Post Note  Patient: Carlos Hunt  Procedure(s) Performed: UNILATERAL PULSE GENERATOR IMPLANT AND EXTENTIONS FOR DEEP BRAIN STIMULATOR (Left Head)     Patient location during evaluation: PACU Anesthesia Type: General Level of consciousness: awake and alert Pain management: pain level controlled Vital Signs Assessment: post-procedure vital signs reviewed and stable Respiratory status: spontaneous breathing, nonlabored ventilation, respiratory function stable and patient connected to nasal cannula oxygen Cardiovascular status: blood pressure returned to baseline and stable Postop Assessment: no apparent nausea or vomiting Anesthetic complications: no    Last Vitals:  Vitals:   01/12/18 1152 01/12/18 1211  BP: 138/67 (!) 152/74  Pulse: (!) 54 61  Resp: 10 18  Temp:  36.4 C  SpO2: 94% 99%    Last Pain:  Vitals:   01/12/18 1211  TempSrc: Oral  PainSc:                  Barnet Glasgow

## 2018-01-12 NOTE — Interval H&P Note (Signed)
History and Physical Interval Note:  01/12/2018 7:40 AM  Carlos Hunt  has presented today for surgery, with the diagnosis of ESSENTIAL TREMOR  The various methods of treatment have been discussed with the patient and family. After consideration of risks, benefits and other options for treatment, the patient has consented to  Procedure(s) with comments: UNILATERAL PULSE GENERATOR IMPLANT AND EXTENTIONS FOR DEEP BRAIN STIMULATOR (Left) - UNILATERAL PULSE GENERATOR IMPLANT AND EXTENTIONS FOR DEEP BRAIN STIMULATOR as a surgical intervention .  The patient's history has been reviewed, patient examined, no change in status, stable for surgery.  I have reviewed the patient's chart and labs.  Questions were answered to the patient's satisfaction.     [name]

## 2018-01-12 NOTE — Anesthesia Preprocedure Evaluation (Addendum)
Anesthesia Evaluation  Patient identified by MRN, date of birth, ID band Patient awake    Reviewed: Allergy & Precautions, NPO status , Patient's Chart, lab work & pertinent test results  History of Anesthesia Complications (+) PONV  Airway Mallampati: II  TM Distance: >3 FB Neck ROM: Full    Dental no notable dental hx.    Pulmonary neg pulmonary ROS, former smoker,    Pulmonary exam normal breath sounds clear to auscultation       Cardiovascular Exercise Tolerance: Good hypertension, Normal cardiovascular exam Rhythm:Regular Rate:Normal     Neuro/Psych negative neurological ROS  negative psych ROS   GI/Hepatic Neg liver ROS, GERD  Medicated,  Endo/Other  negative endocrine ROS  Renal/GU negative Renal ROS     Musculoskeletal negative musculoskeletal ROS (+)   Abdominal   Peds negative pediatric ROS (+)  Hematology negative hematology ROS (+)   Anesthesia Other Findings   Reproductive/Obstetrics negative OB ROS                            Lab Results  Component Value Date   WBC 8.7 01/12/2018   HGB 15.1 01/12/2018   HCT 43.9 01/12/2018   MCV 91.3 01/12/2018   PLT 284 01/12/2018    Anesthesia Physical Anesthesia Plan  ASA: II  Anesthesia Plan: General   Post-op Pain Management:    Induction: Intravenous  PONV Risk Score and Plan: Treatment may vary due to age or medical condition  Airway Management Planned: Oral ETT, Natural Airway and Nasal Cannula  Additional Equipment:   Intra-op Plan:   Post-operative Plan: Extubation in OR  Informed Consent: I have reviewed the patients History and Physical, chart, labs and discussed the procedure including the risks, benefits and alternatives for the proposed anesthesia with the patient or authorized representative who has indicated his/her understanding and acceptance.   Dental advisory given  Plan Discussed with:  CRNA  Anesthesia Plan Comments:         Anesthesia Quick Evaluation

## 2018-01-12 NOTE — Transfer of Care (Signed)
Immediate Anesthesia Transfer of Care Note  Patient: Dravyn A Berberian  Procedure(s) Performed: UNILATERAL PULSE GENERATOR IMPLANT AND EXTENTIONS FOR DEEP BRAIN STIMULATOR (Left )  Patient Location: PACU  Anesthesia Type:General  Level of Consciousness: awake, oriented, drowsy and patient cooperative  Airway & Oxygen Therapy: Patient Spontanous Breathing and Patient connected to face mask oxygen  Post-op Assessment: Report given to RN, Post -op Vital signs reviewed and stable and Patient moving all extremities X 4  Post vital signs: Reviewed and stable  Last Vitals:  Vitals Value Taken Time  BP 156/83 01/12/2018 11:22 AM  Temp 36.5 C 01/12/2018 11:22 AM  Pulse 72 01/12/2018 11:25 AM  Resp 8 01/12/2018 11:25 AM  SpO2 96 % 01/12/2018 11:25 AM  Vitals shown include unvalidated device data.  Last Pain:  Vitals:   01/12/18 1122  TempSrc:   PainSc: 0-No pain         Complications: No apparent anesthesia complications

## 2018-01-13 ENCOUNTER — Encounter (HOSPITAL_COMMUNITY): Payer: Self-pay | Admitting: Neurosurgery

## 2018-01-21 NOTE — Progress Notes (Signed)
Carlos Hunt was seen today in the movement disorders clinic for neurologic consultation at the request of Carlos Julian [name], MD.  I have reviewed numerous prior records made available to me.  This patient is accompanied in the office by his spouse who supplements the history.  The patient presents today for a second opinion regarding essential tremor.  He has been seeing Dr. Manuella Hunt in [place] since 03/02/2014 and his last appointment with him was on November 05, 2015.  The patient reports that tremor began slowly, in approximately 2007.  Tremor started in both hands. Pt is R hand dominant but both hands shake. The patient notices tremor most when the hand is in use.  There is a family history of tremor in the patient's father, daughter, and brother, but his brother and father do have a known diagnosis of Parkinson's disease.  On his first visit with Dr. Manuella Hunt, the patient was started on primidone, 50 mg twice a day for essential tremor.  At his follow-up 2 months later, the patient reported that tremor was better, although he felt that the medication was causing significant dreams.  He continued on the medication until his February, 2017 follow-up and his primidone was increased to 125 mg twice a day.  Jaw tremor was noted that visit.  The patient called the neurology office a few weeks later because of anxiety and just "felt weird" and primidone was reduced to 75 mg twice a day.  04/24/16 update:  The patient was up today, accompanied by his wife who supplements the history.  Records were reviewed and are made available to me.  The patient had a dat scan at [facility] since our last visit.  There was mild asymmetry of update in caudates, less on the left than the right of uncertain etiology and could be from prominent perivascular space or from remote lacute but said not typical for PD or any parkinsonian state.  He had an MRI of the brain in 2016 that was unremarkable.  09/30/16 update:  Patient follows up  today, accompanied by his wife who supplements the history.  The patient had fiducials placed today in anticipation of DBS surgery for essential tremor.  Surgery is scheduled for one week from today, 10/07/2016.  The patient reports that he is ready for surgery.  He is currently on primidone, 50 mg bid.  Doesn't think that it is helping.  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    11/06/16 update:  Pt f/u today.  He underwent bilateral VIM DBS on 10/07/16.  He had IPG placed on 10/23/16 and did stay one night overnight.  Pt reports that he has recovered well.  No falls since our last visit.  He is no longer on the primidone.  11/27/16 update:  Patient follows up today.  His device was activated about 3 weeks ago.  The patient reports that he has done well.  He denies side effects with the device.  In regards to tremor, he states that he has minor tremor in the dominant hand.  No falls.  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  Something which was taking him 30 minutes is now taking him under 5 minutes.  He asks me to look at a place on the scalp.  03/10/17 update:  Pt seen today with wife who supplements the history.  He called 02/25/17 and said that one week prior he started having tremor and having speech issues.  Pt thinks that the onset was slow but wife thinks that it was acute.  Pt states that it was subacute but didn't tell anyone.  He is having lots of speech trouble, trouble thinking and headaches at the R occiput.  He has trouble describing the headache even when given choices of descriptors.  "I don't think that I am numb but I might be."  Unsure where.  Admits to drooling that is new.  No troubles swallowing.  Feels that he is dragging the right leg.  Tremor on the right hand is a bit worse.  He thought that it was related to DBS but told by our office to have evaluated but has not yet done so.    03/31/17 update:  Pt seen in f/u.  Much has happened.  The records  that were made available to me were reviewed.  He had his L DBS system removed on 03/10/17 due to cerebral abscess. It ultimately grew out propionibacterium.  He is seeing Dr. Megan Hunt.  He has an appt with him on 04/14/17.  He saw Dr. Vertell Hunt last week and while I don't have f/u notes, I did speak to Dr. Vertell Hunt about him.  He felt that he was doing better and wanted to hold off on repeating CT brain until abx were completed.  Pt reports that he feels great.  Speech is back to normal.  Biggest issue is re-emergence of tremor in the right hand.  Asks me about when DBS can be placed back.    07/20/17 update: Patient seen in follow-up for his essential tremor.  I have reviewed his records since our last visit.  He is off of all antibiotics.  He last saw Dr. Megan Hunt on June 04, 2017.  He had right inguinal and umbilical hernia repair surgery since last visit, completed on July 15, 2017.  Pt feels great and is on no pain medication.  He would like to proceed with placement of DBS again.  He is having difficulty eating because of tremor on the right.  He has significant difficulty eating.  11/17/17 update: Patient is seen today in follow-up for his essential tremor.  He is accompanied by his wife who supplements the history.  He reports that he is doing well, with the exception of having tremor in the right hand because of the fact that the device was removed on the left side due to infection.  He is having difficulty eating.  He very much expresses his desire to have his lead replaced.  He has just changed jobs and is working 2nd shift, full time.    01/25/18 update: Patient is seen today in follow-up for essential tremor.  The patient underwent left VIM DBS on December 04, 2017.  Insertion of battery had to be postponed as when he came in for it, scalp infection was noted.  He was placed on antibiotics and subsequently had his battery on Jan 12, 2018.  Pt reports doing well since then.  Not back to work yet.  Has appt  later today with Dr. Vertell Hunt.  Still with tremor in the R hand.  Neuroimaging has previously been performed.  It is available for my review today.  MRI was done 10/02/14 and was unremarkable.    ALLERGIES:   Allergies  Allergen Reactions  . Procaine Nausea And Vomiting and Other (See Comments)    NOVOCAINE  . Sulfacetamide Sodium-Sulfur Rash    CURRENT MEDICATIONS:  Outpatient Encounter Medications as of 01/25/2018  Medication Sig  .  Ascorbic Acid (VITAMIN C) 1000 MG tablet Take 1,000 mg by mouth daily.  Marland Kitchen aspirin 325 MG tablet Take 325 mg by mouth daily.  . Cholecalciferol (VITAMIN D3) 2000 units TABS Take 2,000 Units by mouth daily.  . Cyanocobalamin (B-12) 3000 MCG CAPS Take 3,000 mcg by mouth daily.  . Digestive Enzymes (PAPAYA AND ENZYMES PO) Take 1 tablet by mouth daily as needed (for heartburn).   Marland Kitchen ECHINACEA PO Take 334 mg by mouth daily.  . fluticasone (FLONASE) 50 MCG/ACT nasal spray Place 1 spray into both nostrils daily as needed for allergies or rhinitis.  . hydrochlorothiazide (HYDRODIURIL) 25 MG tablet Take 1 tablet (25 mg total) by mouth daily.  . irbesartan (AVAPRO) 150 MG tablet Take 1 tablet (150 mg total) by mouth daily.  . Methylsulfonylmethane (MSM) 1000 MG CAPS Take 1,000 mg by mouth daily.  . Multiple Vitamins-Minerals (CENTRUM PO) Take 1 tablet by mouth daily.   . Tetrahydroz-Glyc-Hyprom-PEG (VISINE MAXIMUM REDNESS RELIEF OP) Place 1 drop into both eyes daily as needed (for irritation).   . [DISCONTINUED] cephALEXin (KEFLEX) 500 MG capsule Take 500 mg by mouth 3 (three) times daily.   No facility-administered encounter medications on file as of 01/25/2018.     PAST MEDICAL HISTORY:   Past Medical History:  Diagnosis Date  . Adenomatous polyps 04/02/2017  . Brain abscess 03/11/2017  . Elevated liver enzymes   . Erectile dysfunction   . Essential hypertension, benign 02/14/2013  . Essential tremor   . GERD (gastroesophageal reflux disease)   . Head injury   .  Hx of colonic polyps   . Hypertension   . Pain in left knee   . Pain, joint, shoulder   . PONV (postoperative nausea and vomiting)    nausea no vomiting  . Seasonal allergies   . Testicular hypofunction   . Tobacco use disorder   . Tremor   . Unspecified vitamin D deficiency     PAST SURGICAL HISTORY:   Past Surgical History:  Procedure Laterality Date  . COLONOSCOPY  09/09/2011   normal.  Previous polyps.  [name]. Repeat 5 years.  . COLONOSCOPY N/A 07/18/2016   Procedure: COLONOSCOPY;  Surgeon: Manya Silvas, MD;  Location: [facility] ENDOSCOPY;  Service: Endoscopy;  Laterality: N/A;  . HAND SURGERY Left 2011   trauma  . LAPAROSCOPIC INGUINAL HERNIA WITH UMBILICAL HERNIA Right 23/11/74   Procedure: LAPAROSCOPIC INGUINAL HERNIA REPAIR, right with umbilical herniorrhaphy;  Surgeon: Florene Glen, MD;  Location: [facility] ORS;  Service: General;  Laterality: Right;  . MINOR PLACEMENT OF FIDUCIAL N/A 11/24/2017   Procedure: MINOR PLACEMENT OF FIDUCIAL;  Surgeon: Erline Levine, MD;  Location: [place];  Service: Neurosurgery;  Laterality: N/A;  Fiducial placement  . PULSE GENERATOR IMPLANT Bilateral 10/23/2016   Procedure: BILATERAL IMPLANTABLE PULSE GENERATOR;  Surgeon: Erline Levine, MD;  Location: [place];  Service: Neurosurgery;  Laterality: Bilateral;  BILATERAL IMPLANTABLE PULSE GENERATOR  . PULSE GENERATOR IMPLANT Left 01/12/2018   Procedure: UNILATERAL PULSE GENERATOR IMPLANT AND EXTENTIONS FOR DEEP BRAIN STIMULATOR;  Surgeon: Erline Levine, MD;  Location: [place];  Service: Neurosurgery;  Laterality: Left;  . SUBTHALAMIC STIMULATOR INSERTION Bilateral 10/07/2016   Procedure: BILATERAL DEEP BRAIN STIMULATOR PLACEMENT;  Surgeon: Erline Levine, MD;  Location: [place];  Service: Neurosurgery;  Laterality: Bilateral;  BILATERAL DEEP BRAIN STIMULATOR PLACEMENT  . SUBTHALAMIC STIMULATOR INSERTION Left 12/04/2017   Procedure: Left Deep Brain Stimulator Placement;  Surgeon: Erline Levine, MD;  Location: [place];  Service: Neurosurgery;  Laterality: Left;  Left deep brain stimulator placement  . SUBTHALAMIC STIMULATOR REMOVAL Left 03/10/2017   Procedure: REMOVAL DEEP BRAIN STIMULATOR AND ASPIRATION OF BRAIN ABSCESS;  Surgeon: Earnie Larsson, MD;  Location: [place];  Service: Neurosurgery;  Laterality: Left;  . UMBILICAL HERNIA REPAIR N/A 07/15/2017   Procedure: HERNIA REPAIR UMBILICAL ADULT;  Surgeon: Florene Glen, MD;  Location: [facility] ORS;  Service: General;  Laterality: N/A;    SOCIAL HISTORY:   Social History   Socioeconomic History  . Marital status: Married    Spouse name: [name]  . Number of children: 4  . Years of education: Not on file  . Highest education level: Not on file  Occupational History  . Occupation: building maintenance    Comment: Chemical engineer  Social Needs  . Financial resource strain: Not on file  . Food insecurity:    Worry: Not on file    Inability: Not on file  . Transportation needs:    Medical: Not on file    Non-medical: Not on file  Tobacco Use  . Smoking status: Former Smoker    Packs/day: 3.00    Years: 10.00    Pack years: 30.00    Types: Cigarettes    Last attempt to quit: 09/23/1970    Years since quitting: 47.3  . Smokeless tobacco: Never Used  . Tobacco comment: quit in the 1970's  Substance and Sexual Activity  . Alcohol use: Yes    Alcohol/week: 0.6 oz    Types: 1 Shots of liquor per week    Comment: occasional; once every 6 months  . Drug use: No  . Sexual activity: Yes    Birth control/protection: Post-menopausal  Lifestyle  . Physical activity:    Days per week: Not on file    Minutes per session: Not on file  . Stress: Not on file  Relationships  . Social connections:    Talks on phone: Not on file    Gets together: Not on file    Attends religious service: Not on file    Active member of club or organization: Not on file    Attends meetings of clubs or organizations: Not on file    Relationship status: Not on file  .  Intimate partner violence:    Fear of current or ex partner: Not on file    Emotionally abused: Not on file    Physically abused: Not on file    Forced sexual activity: Not on file  Other Topics Concern  . Not on file  Social History Narrative   Marital status: married x 46 years.      Children: 2 daughters, 2 sons; 6 grandchildren.      Lives: with wife.        Employment: RETIRED in 2018 from [place] x 13 years; moderately happy.  In 2019, working for [employer] in [place] full time.      Tobacco: quit 42 years ago; smoked x 10 years.       Alcohol: socially; weekends.  Rarely.      Drugs: none      Exercise:  None; work is physical.        Seatbelt: 100%      Sunscreen: rare sun exposure      Guns:  4 gun; unloaded, not stored in locked cabinet.       Smoke alarm and carbon monoxide detector in the home.      Caffeine use: consumes a minimal amount  Advanced Directives/Living Will: patient does NOT have Living Will; Does NOT have HCPOA; getting ready to work on it.     Organ Donor: NO          FAMILY HISTORY:   Family Status  Relation Name Status  . Mother  Deceased at age 39       dementia  . Father  Deceased at age 60       PD, heart disease  . Sister  Alive       unknown  . Brother  Deceased at age 9       Parkinson's disease  . Sister  Alive       unknown  . Brother  Alive       unknown  . Brother  Alive       unknown  . Daughter  Alive       tremor  . Son  Alive       healthy    ROS:  A complete 10 system review of systems was obtained and was unremarkable apart from what is mentioned above.  PHYSICAL EXAMINATION:    VITALS:   Vitals:   01/25/18 0855  BP: 132/62  Pulse: (!) 52  SpO2: 96%  Weight: 204 lb (92.5 kg)  Height: 5' 8"  (1.727 m)   GEN:  The patient appears stated age and is in NAD. HEENT:  Normocephalic, atraumatic.  The mucous membranes are moist. The superficial temporal arteries are without ropiness or tenderness. CV:   RRR Lungs:  CTAB Neck/HEME:  There are no carotid bruits bilaterally.  Neurological examination:  Orientation: The patient is alert and oriented x3. Cranial nerves: There is good facial symmetry. The speech is fluent and clear. Soft palate rises symmetrically and there is no tongue deviation. Hearing is intact to conversational tone. Sensation: Sensation is intact to light touch throughout Motor: Strength is 5/5 in the bilateral upper and lower extremities.   Shoulder shrug is equal and symmetric.  There is no pronator drift.  Movement examination: Tone: There is normal tone in the UE/LE Abnormal movements: there is no tremor of the L hand.  There is min tremor of the right hand, until he is given a weight and then tremor is mod.  Once device on the L brain is turned on, tremor is gone in the R hand and he is able to pour water from one glass to another. Coordination:  There is no decremation with RAM's, with any form of RAMS, including alternating supination and pronation of the forearm, hand opening and closing, finger taps, heel taps and toe taps. Gait and Station: The patient has no difficulty arising out of a deep-seated chair without the use of the hands. The patient's stride length is normal.     DBS programming is performed today.  The L brain was programmed and is described in more detail on separate procedural note  ASSESSMENT/PLAN:  1.  Essential Tremor  -The patient had a dat scan at [facility] since our last visit.  There was mild asymmetry of uptake in caudates, less on the left than the right of uncertain etiology and could be from prominent perivascular space or from remote lacute but said not typical for PD or any parkinsonian state.  He had an MRI of the brain in 2016 that was unremarkable.  -Patient underwent bilateral VIM DBS on 10/07/2016.  He had bilateral IPG placement on 10/23/2016.  Patient's DBS device was activated, 11/07/2016.  Unfortunately, he had a cerebral  absess  on the left and the L system was removed on 03/10/17.  Patient completed IV antibiotics.  Patient subsequently had left VIM DBS on December 04, 2017 with IPG placement on Jan 12, 2018.  Pts L VIM was programmed and turned on today  -discussed s/s of infection  2. Pt to follow up in one week.

## 2018-01-22 ENCOUNTER — Ambulatory Visit (INDEPENDENT_AMBULATORY_CARE_PROVIDER_SITE_OTHER): Payer: Medicare Other | Admitting: Urology

## 2018-01-22 ENCOUNTER — Encounter: Payer: Self-pay | Admitting: Urology

## 2018-01-22 DIAGNOSIS — D1771 Benign lipomatous neoplasm of kidney: Secondary | ICD-10-CM

## 2018-01-22 NOTE — Progress Notes (Signed)
01/22/2018 4:05 PM   Carlos Hunt Jan 22, 1946 [id number]  Referring provider: Wardell Honour, MD [address] [place], [place] [address]  Chief Complaint  Patient presents with  . Establish Care    HPI: Carlos Hunt is a 72 year old male who thought he was here today to be evaluated for elevated liver enzymes.  He had an MRI of the abdomen with and without contrast in December 2017 and was incidentally noted to have an 8 mm angiomyolipoma of the left kidney.  He recently had an abdominal ultrasound performed for evaluation of elevated liver enzymes and the angiomyolipoma was measured at 12 mm.  He denies flank or abdominal pain.  He denies gross hematuria.  He has had no previous history of urologic problems.  He was also noted to have renal cysts.   PMH: Past Medical History:  Diagnosis Date  . Adenomatous polyps 04/02/2017  . Brain abscess 03/11/2017  . Elevated liver enzymes   . Erectile dysfunction   . Essential hypertension, benign 02/14/2013  . Essential tremor   . GERD (gastroesophageal reflux disease)   . Head injury   . Hx of colonic polyps   . Hypertension   . Pain in left knee   . Pain, joint, shoulder   . PONV (postoperative nausea and vomiting)    nausea no vomiting  . Seasonal allergies   . Testicular hypofunction   . Tobacco use disorder   . Tremor   . Unspecified vitamin D deficiency     Surgical History: Past Surgical History:  Procedure Laterality Date  . COLONOSCOPY  09/09/2011   normal.  Previous polyps.  [name]. Repeat 5 years.  . COLONOSCOPY N/A 07/18/2016   Procedure: COLONOSCOPY;  Surgeon: Manya Silvas, MD;  Location: [facility] ENDOSCOPY;  Service: Endoscopy;  Laterality: N/A;  . HAND SURGERY Left 2011   trauma  . LAPAROSCOPIC INGUINAL HERNIA WITH UMBILICAL HERNIA Right 98/11/3823   Procedure: LAPAROSCOPIC INGUINAL HERNIA REPAIR, right with umbilical herniorrhaphy;  Surgeon: Florene Glen, MD;  Location: [facility] ORS;  Service: General;  Laterality:  Right;  . MINOR PLACEMENT OF FIDUCIAL N/A 11/24/2017   Procedure: MINOR PLACEMENT OF FIDUCIAL;  Surgeon: Erline Levine, MD;  Location: [place];  Service: Neurosurgery;  Laterality: N/A;  Fiducial placement  . PULSE GENERATOR IMPLANT Bilateral 10/23/2016   Procedure: BILATERAL IMPLANTABLE PULSE GENERATOR;  Surgeon: Erline Levine, MD;  Location: [place];  Service: Neurosurgery;  Laterality: Bilateral;  BILATERAL IMPLANTABLE PULSE GENERATOR  . PULSE GENERATOR IMPLANT Left 01/12/2018   Procedure: UNILATERAL PULSE GENERATOR IMPLANT AND EXTENTIONS FOR DEEP BRAIN STIMULATOR;  Surgeon: Erline Levine, MD;  Location: [place];  Service: Neurosurgery;  Laterality: Left;  . SUBTHALAMIC STIMULATOR INSERTION Bilateral 10/07/2016   Procedure: BILATERAL DEEP BRAIN STIMULATOR PLACEMENT;  Surgeon: Erline Levine, MD;  Location: [place];  Service: Neurosurgery;  Laterality: Bilateral;  BILATERAL DEEP BRAIN STIMULATOR PLACEMENT  . SUBTHALAMIC STIMULATOR INSERTION Left 12/04/2017   Procedure: Left Deep Brain Stimulator Placement;  Surgeon: Erline Levine, MD;  Location: [place];  Service: Neurosurgery;  Laterality: Left;  Left deep brain stimulator placement  . SUBTHALAMIC STIMULATOR REMOVAL Left 03/10/2017   Procedure: REMOVAL DEEP BRAIN STIMULATOR AND ASPIRATION OF BRAIN ABSCESS;  Surgeon: Earnie Larsson, MD;  Location: [employer];  Service: Neurosurgery;  Laterality: Left;  . UMBILICAL HERNIA REPAIR N/A 07/15/2017   Procedure: HERNIA REPAIR UMBILICAL ADULT;  Surgeon: Florene Glen, MD;  Location: [facility] ORS;  Service: General;  Laterality: N/A;    Home Medications:  Allergies as of 01/22/2018      Reactions   Procaine Nausea And Vomiting, Other (See Comments)   NOVOCAINE   Sulfacetamide Sodium-sulfur Rash      Medication List        Accurate as of 01/22/18  4:05 PM. Always use your most recent med list.          aspirin 325 MG tablet Take 325 mg by mouth daily.   B-12 3000 MCG Caps Take 3,000 mcg by mouth daily.   CENTRUM  PO Take 1 tablet by mouth daily.   ECHINACEA PO Take 334 mg by mouth daily.   fluticasone 50 MCG/ACT nasal spray Commonly known as:  FLONASE Place 1 spray into both nostrils daily as needed for allergies or rhinitis.   hydrochlorothiazide 25 MG tablet Commonly known as:  HYDRODIURIL Take 1 tablet (25 mg total) by mouth daily.   irbesartan 150 MG tablet Commonly known as:  AVAPRO Take 1 tablet (150 mg total) by mouth daily.   MSM 1000 MG Caps Take 1,000 mg by mouth daily.   PAPAYA AND ENZYMES PO Take 1 tablet by mouth daily as needed (for heartburn).   VISINE MAXIMUM REDNESS RELIEF OP Place 1 drop into both eyes daily as needed (for irritation).   vitamin C 1000 MG tablet Take 1,000 mg by mouth daily.   Vitamin D3 2000 units Tabs Take 2,000 Units by mouth daily.       Allergies:  Allergies  Allergen Reactions  . Procaine Nausea And Vomiting and Other (See Comments)    NOVOCAINE  . Sulfacetamide Sodium-Sulfur Rash    Family History: Family History  Problem Relation Age of Onset  . Dementia Mother   . Parkinson's disease Father   . Cancer Father 80       prostate  . Heart disease Father 45       CAD/CABG  . Hypertension Father   . Hyperlipidemia Father   . Parkinsonism Father   . Parkinson's disease Brother   . Diabetes Sister     Social History:  reports that he quit smoking about 47 years ago. His smoking use included cigarettes. He has a 30.00 pack-year smoking history. He has never used smokeless tobacco. He reports that he drinks about 0.6 oz of alcohol per week. He reports that he does not use drugs.  ROS: UROLOGY Frequent Urination?: No Hard to postpone urination?: No Burning/pain with urination?: No Get up at night to urinate?: Yes Leakage of urine?: No Urine stream starts and stops?: No Trouble starting stream?: No Do you have to strain to urinate?: No Blood in urine?: No Urinary tract infection?: No Sexually transmitted disease?:  No Injury to kidneys or bladder?: No Painful intercourse?: No Weak stream?: No Erection problems?: Yes Penile pain?: No  Gastrointestinal Nausea?: No Vomiting?: No Indigestion/heartburn?: Yes Diarrhea?: Yes Constipation?: No  Constitutional Fever: No Night sweats?: No Weight loss?: No Fatigue?: No  Skin Skin rash/lesions?: No Itching?: No  Eyes Blurred vision?: No Double vision?: No  Ears/Nose/Throat Sore throat?: No Sinus problems?: No  Hematologic/Lymphatic Swollen glands?: No Easy bruising?: No  Cardiovascular Leg swelling?: No Chest pain?: No  Respiratory Cough?: No Shortness of breath?: No  Endocrine Excessive thirst?: No  Musculoskeletal Back pain?: No Joint pain?: Yes  Neurological Headaches?: No Dizziness?: No  Psychologic Depression?: No Anxiety?: No  Physical Exam: There were no vitals taken for this visit.  Constitutional:  Alert and oriented, No acute distress. HEENT: [place] AT, moist mucus membranes.  Trachea midline, no masses. Cardiovascular: No clubbing, cyanosis, or edema. Respiratory: Normal respiratory effort, no increased work of breathing. GI: Abdomen is soft, nontender, nondistended, no abdominal masses GU: No CVA tenderness Lymph: No cervical or inguinal lymphadenopathy. Skin: No rashes, bruises or suspicious lesions. Neurologic: Grossly intact, no focal deficits, moving all 4 extremities. Psychiatric: Normal mood and affect.  Laboratory Data: Lab Results  Component Value Date   WBC 8.7 01/12/2018   HGB 15.1 01/12/2018   HCT 43.9 01/12/2018   MCV 91.3 01/12/2018   PLT 284 01/12/2018    Lab Results  Component Value Date   CREATININE 1.14 01/12/2018    Lab Results  Component Value Date   PSA 0.75 11/27/2015   PSA 0.59 11/13/2014   PSA 0.69 02/06/2014    Lab Results  Component Value Date   HGBA1C 5.7 (H) 11/27/2015    Pertinent Imaging: Imaging was reviewed  Assessment & Plan:   72 year old male  with a Dinan left renal angiomyolipoma.  We discussed the benign nature of these masses and the potential of bleeding if they increase in size greater than 4 cm.  Have recommended a follow-up renal ultrasound in 1 year.  He was instructed to call earlier for development of right abdominal pain.   Return in about 1 year (around 01/23/2019) for [place], [place].  Abbie Sons, [place] [address], [place], [place] [address] [phone]

## 2018-01-24 ENCOUNTER — Encounter: Payer: Self-pay | Admitting: Urology

## 2018-01-24 DIAGNOSIS — D1771 Benign lipomatous neoplasm of kidney: Secondary | ICD-10-CM | POA: Insufficient documentation

## 2018-01-25 ENCOUNTER — Ambulatory Visit (INDEPENDENT_AMBULATORY_CARE_PROVIDER_SITE_OTHER): Payer: Medicare Other | Admitting: Neurology

## 2018-01-25 ENCOUNTER — Ambulatory Visit: Payer: Medicare Other | Admitting: Neurology

## 2018-01-25 VITALS — BP 132/62 | HR 52 | Ht 68.0 in | Wt 204.0 lb

## 2018-01-25 DIAGNOSIS — G250 Essential tremor: Secondary | ICD-10-CM

## 2018-01-25 NOTE — Procedures (Signed)
DBS Programming was performed.    Total time spent programming was 25 minutes.  Device was confirmed to be on on the right and OFF on the L.  Device was turned on on the left.  Soft start was confirmed to be on.  Impedences were checked and were within normal limits.  Battery was checked and was determined to be functioning normally and not near the end of life (3.04V on the left and 2.97 on the right).  Final settings were as follows:  Left brain electrode:     0-1+; Amplitude 2.0V; PW 90 microseconds; Frequency 150 Hz (therapy impedence at 1608)  Right brain electrode:     2-1+          ; Amplitude   2.7  V ;  Pulse width 90  microseconds;  Frequency   150    Hz. (therapy impedence at 1375)

## 2018-01-28 NOTE — Progress Notes (Signed)
Carlos Hunt was seen today in the movement disorders clinic for neurologic consultation at the request of [name], MD.  I have reviewed numerous prior records made available to me.  This patient is accompanied in the office by his spouse who supplements the history.  The patient presents today for a second opinion regarding essential tremor.  He has been seeing [name] in [place] since 03/02/2014 and his last appointment with him was on November 05, 2015.  The patient reports that tremor began slowly, in approximately 2007.  Tremor started in both hands. Pt is R hand dominant but both hands shake. The patient notices tremor most when the hand is in use.  There is a family history of tremor in the patient's father, daughter, and brother, but his brother and father do have a known diagnosis of Parkinson's disease.  On his first visit with [name], the patient was started on primidone, 50 mg twice a day for essential tremor.  At his follow-up 2 months later, the patient reported that tremor was better, although he felt that the medication was causing significant dreams.  He continued on the medication until his February, 2017 follow-up and his primidone was increased to 125 mg twice a day.  Jaw tremor was noted that visit.  The patient called the neurology office a few weeks later because of anxiety and just "felt weird" and primidone was reduced to 75 mg twice a day.  04/24/16 update:  The patient was up today, accompanied by his wife who supplements the history.  Records were reviewed and are made available to me.  The patient had a dat scan at [facility] since our last visit.  There was mild asymmetry of update in caudates, less on the left than the right of uncertain etiology and could be from prominent perivascular space or from remote lacute but said not typical for PD or any parkinsonian state.  He had an MRI of the brain in 2016 that was unremarkable.  09/30/16 update:  Patient follows up  today, accompanied by his wife who supplements the history.  The patient had fiducials placed today in anticipation of DBS surgery for essential tremor.  Surgery is scheduled for one week from today, 10/07/2016.  The patient reports that he is ready for surgery.  He is currently on primidone, 50 mg bid.  Doesn't think that it is helping.  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    11/06/16 update:  Pt f/u today.  He underwent bilateral VIM DBS on 10/07/16.  He had IPG placed on 10/23/16 and did stay one night overnight.  Pt reports that he has recovered well.  No falls since our last visit.  He is no longer on the primidone.  11/27/16 update:  Patient follows up today.  His device was activated about 3 weeks ago.  The patient reports that he has done well.  He denies side effects with the device.  In regards to tremor, he states that he has minor tremor in the dominant hand.  No falls.  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  Something which was taking him 30 minutes is now taking him under 5 minutes.  He asks me to look at a place on the scalp.  03/10/17 update:  Pt seen today with wife who supplements the history.  He called 02/25/17 and said that one week prior he started having tremor and having speech issues.  Pt thinks that the onset was slow but wife thinks that it was acute.  Pt states that it was subacute but didn't tell anyone.  He is having lots of speech trouble, trouble thinking and headaches at the R occiput.  He has trouble describing the headache even when given choices of descriptors.  "I don't think that I am numb but I might be."  Unsure where.  Admits to drooling that is new.  No troubles swallowing.  Feels that he is dragging the right leg.  Tremor on the right hand is a bit worse.  He thought that it was related to DBS but told by our office to have evaluated but has not yet done so.    03/31/17 update:  Pt seen in f/u.  Much has happened.  The records  that were made available to me were reviewed.  He had his L DBS system removed on 03/10/17 due to cerebral abscess. It ultimately grew out propionibacterium.  He is seeing [name].  He has an appt with him on 04/14/17.  He saw [name] last week and while I don't have f/u notes, I did speak to [name] about him.  He felt that he was doing better and wanted to hold off on repeating CT brain until abx were completed.  Pt reports that he feels great.  Speech is back to normal.  Biggest issue is re-emergence of tremor in the right hand.  Asks me about when DBS can be placed back.    07/20/17 update: Patient seen in follow-up for his essential tremor.  I have reviewed his records since our last visit.  He is off of all antibiotics.  He last saw [name] on June 04, 2017.  He had right inguinal and umbilical hernia repair surgery since last visit, completed on July 15, 2017.  Pt feels great and is on no pain medication.  He would like to proceed with placement of DBS again.  He is having difficulty eating because of tremor on the right.  He has significant difficulty eating.  11/17/17 update: Patient is seen today in follow-up for his essential tremor.  He is accompanied by his wife who supplements the history.  He reports that he is doing well, with the exception of having tremor in the right hand because of the fact that the device was removed on the left side due to infection.  He is having difficulty eating.  He very much expresses his desire to have his lead replaced.  He has just changed jobs and is working 2nd shift, full time.    01/25/18 update: Patient is seen today in follow-up for essential tremor.  The patient underwent left VIM DBS on December 04, 2017.  Insertion of battery had to be postponed as when he came in for it, scalp infection was noted.  He was placed on antibiotics and subsequently had his battery on Jan 12, 2018.  Pt reports doing well since then.  Not back to work yet.  Has appt  later today with [name].  Still with tremor in the R hand.  02/02/18 update: Patient seen in follow-up for essential tremor.  He is accompanied by his wife who supplements the history.  His device was activated last week (left VIM).  He reports that he is able to drink water "single handedly" and not needing 2 hands.  He thinks that speech is bit slurry but his wife thinks it sounds the same as previous, as  does his daughter.    Neuroimaging has previously been performed.  It is available for my review today.  MRI was done 10/02/14 and was unremarkable.    ALLERGIES:   Allergies  Allergen Reactions  . Procaine Nausea And Vomiting and Other (See Comments)    NOVOCAINE  . Sulfacetamide Sodium-Sulfur Rash    CURRENT MEDICATIONS:  Outpatient Encounter Medications as of 02/02/2018  Medication Sig  . Ascorbic Acid (VITAMIN C) 1000 MG tablet Take 1,000 mg by mouth daily.  Marland Kitchen aspirin 325 MG tablet Take 325 mg by mouth daily.  . Cholecalciferol (VITAMIN D3) 2000 units TABS Take 2,000 Units by mouth daily.  . Cyanocobalamin (B-12) 3000 MCG CAPS Take 3,000 mcg by mouth daily.  . Digestive Enzymes (PAPAYA AND ENZYMES PO) Take 1 tablet by mouth daily as needed (for heartburn).   Marland Kitchen ECHINACEA PO Take 334 mg by mouth daily.  . fluticasone (FLONASE) 50 MCG/ACT nasal spray Place 1 spray into both nostrils daily as needed for allergies or rhinitis.  . hydrochlorothiazide (HYDRODIURIL) 25 MG tablet Take 1 tablet (25 mg total) by mouth daily.  . irbesartan (AVAPRO) 150 MG tablet Take 1 tablet (150 mg total) by mouth daily.  . Methylsulfonylmethane (MSM) 1000 MG CAPS Take 1,000 mg by mouth daily.  . Multiple Vitamins-Minerals (CENTRUM PO) Take 1 tablet by mouth daily.   . Tetrahydroz-Glyc-Hyprom-PEG (VISINE MAXIMUM REDNESS RELIEF OP) Place 1 drop into both eyes daily as needed (for irritation).    No facility-administered encounter medications on file as of 02/02/2018.     PAST MEDICAL HISTORY:   Past  Medical History:  Diagnosis Date  . Adenomatous polyps 04/02/2017  . Brain abscess 03/11/2017  . Elevated liver enzymes   . Erectile dysfunction   . Essential hypertension, benign 02/14/2013  . Essential tremor   . GERD (gastroesophageal reflux disease)   . Head injury   . Hx of colonic polyps   . Hypertension   . Pain in left knee   . Pain, joint, shoulder   . PONV (postoperative nausea and vomiting)    nausea no vomiting  . Seasonal allergies   . Testicular hypofunction   . Tobacco use disorder   . Tremor   . Unspecified vitamin D deficiency     PAST SURGICAL HISTORY:   Past Surgical History:  Procedure Laterality Date  . COLONOSCOPY  09/09/2011   normal.  Previous polyps.  [name]. Repeat 5 years.  . COLONOSCOPY N/A 07/18/2016   Procedure: COLONOSCOPY;  Surgeon: Manya Silvas, MD;  Location[facility] ENDOSCOPY;  Service: Endoscopy;  Laterality: N/A;  . HAND SURGERY Left 2011   trauma  . LAPAROSCOPIC INGUINAL HERNIA WITH UMBILICAL HERNIA Right 07/17/1477   Procedure: LAPAROSCOPIC INGUINAL HERNIA REPAIR, right with umbilical herniorrhaphy;  Surgeon: Florene Glen, MD;  Location: [facility] ORS;  Service: General;  Laterality: Right;  . MINOR PLACEMENT OF FIDUCIAL N/A 11/24/2017   Procedure: MINOR PLACEMENT OF FIDUCIAL;  Surgeon: Erline Levine, MD;  Location: [place];  Service: Neurosurgery;  Laterality: N/A;  Fiducial placement  . PULSE GENERATOR IMPLANT Bilateral 10/23/2016   Procedure: BILATERAL IMPLANTABLE PULSE GENERATOR;  Surgeon: Erline Levine, MD;  Location: [place];  Service: Neurosurgery;  Laterality: Bilateral;  BILATERAL IMPLANTABLE PULSE GENERATOR  . PULSE GENERATOR IMPLANT Left 01/12/2018   Procedure: UNILATERAL PULSE GENERATOR IMPLANT AND EXTENTIONS FOR DEEP BRAIN STIMULATOR;  Surgeon: Erline Levine, MD;  Location: [place];  Service: Neurosurgery;  Laterality: Left;  . SUBTHALAMIC STIMULATOR INSERTION Bilateral 10/07/2016  Procedure: BILATERAL DEEP BRAIN STIMULATOR PLACEMENT;   Surgeon: Erline Levine, MD;  Location: [place];  Service: Neurosurgery;  Laterality: Bilateral;  BILATERAL DEEP BRAIN STIMULATOR PLACEMENT  . SUBTHALAMIC STIMULATOR INSERTION Left 12/04/2017   Procedure: Left Deep Brain Stimulator Placement;  Surgeon: Erline Levine, MD;  Location: [place];  Service: Neurosurgery;  Laterality: Left;  Left deep brain stimulator placement  . SUBTHALAMIC STIMULATOR REMOVAL Left 03/10/2017   Procedure: REMOVAL DEEP BRAIN STIMULATOR AND ASPIRATION OF BRAIN ABSCESS;  Surgeon: Earnie Larsson, MD;  Location: [place];  Service: Neurosurgery;  Laterality: Left;  . UMBILICAL HERNIA REPAIR N/A 07/15/2017   Procedure: HERNIA REPAIR UMBILICAL ADULT;  Surgeon: Florene Glen, MD;  Location: [facility] ORS;  Service: General;  Laterality: N/A;    SOCIAL HISTORY:   Social History   Socioeconomic History  . Marital status: Married    Spouse name: [name]  . Number of children: 4  . Years of education: Not on file  . Highest education level: Not on file  Occupational History  . Occupation: building maintenance    Comment: Chemical engineer  Social Needs  . Financial resource strain: Not on file  . Food insecurity:    Worry: Not on file    Inability: Not on file  . Transportation needs:    Medical: Not on file    Non-medical: Not on file  Tobacco Use  . Smoking status: Former Smoker    Packs/day: 3.00    Years: 10.00    Pack years: 30.00    Types: Cigarettes    Last attempt to quit: 09/23/1970    Years since quitting: 47.3  . Smokeless tobacco: Never Used  . Tobacco comment: quit in the 1970's  Substance and Sexual Activity  . Alcohol use: Yes    Alcohol/week: 0.6 oz    Types: 1 Shots of liquor per week    Comment: occasional; once every 6 months  . Drug use: No  . Sexual activity: Yes    Birth control/protection: Post-menopausal  Lifestyle  . Physical activity:    Days per week: Not on file    Minutes per session: Not on file  . Stress: Not on file  Relationships  .  Social connections:    Talks on phone: Not on file    Gets together: Not on file    Attends religious service: Not on file    Active member of club or organization: Not on file    Attends meetings of clubs or organizations: Not on file    Relationship status: Not on file  . Intimate partner violence:    Fear of current or ex partner: Not on file    Emotionally abused: Not on file    Physically abused: Not on file    Forced sexual activity: Not on file  Other Topics Concern  . Not on file  Social History Narrative   Marital status: married x 46 years.      Children: 2 daughters, 2 sons; 6 grandchildren.      Lives: with wife.        Employment: RETIRED in 2018 from [employer] x 13 years; moderately happy.  In 2019, working for [employer] in [place] at [employer] full time.      Tobacco: quit 42 years ago; smoked x 10 years.       Alcohol: socially; weekends.  Rarely.      Drugs: none      Exercise:  None; work is physical.  Seatbelt: 100%      Sunscreen: rare sun exposure      Guns:  4 gun; unloaded, not stored in locked cabinet.       Smoke alarm and carbon monoxide detector in the home.      Caffeine use: consumes a minimal amount       Advanced Directives/Living Will: patient does NOT have Living Will; Does NOT have HCPOA; getting ready to work on it.     Organ Donor: NO          FAMILY HISTORY:   Family Status  Relation Name Status  . Mother  Deceased at age 16       dementia  . Father  Deceased at age 47       PD, heart disease  . Sister  Alive       unknown  . Brother  Deceased at age 55       Parkinson's disease  . Sister  Alive       unknown  . Brother  Alive       unknown  . Brother  Alive       unknown  . Daughter  Alive       tremor  . Son  Alive       healthy    ROS:  A complete 10 system review of systems was obtained and was unremarkable apart from what is mentioned above.  PHYSICAL EXAMINATION:    VITALS:   Vitals:   02/02/18 0830  BP: 134/70    Pulse: (!) 52  SpO2: 97%  Weight: 207 lb (93.9 kg)  Height: 5' 8"  (1.727 m)   GEN:  The patient appears stated age and is in NAD. HEENT:  Normocephalic, atraumatic.  The mucous membranes are moist. The superficial temporal arteries are without ropiness or tenderness. CV:  Loletha Grayer.  regular Lungs:  CTAB Neck/HEME:  There are no carotid bruits bilaterally.  Neurological examination:  Orientation: The patient is alert and oriented x3. Cranial nerves: There is good facial symmetry. The speech is fluent and clear.  No dysarthria noted.  Soft palate rises symmetrically and there is no tongue deviation. Hearing is intact to conversational tone. Sensation: Sensation is intact to light touch throughout Motor: Strength is 5/5 in the bilateral upper and lower extremities.   Shoulder shrug is equal and symmetric.  There is no pronator drift.  Movement examination: Tone: There is normal tone in the UE/LE Abnormal movements: there is no postural tremor.  No intention tremor Coordination:  There is no decremation with RAM's, with any form of RAMS, including alternating supination and pronation of the forearm, hand opening and closing, finger taps, heel taps and toe taps. Gait and Station: The patient has no difficulty arising out of a deep-seated chair without the use of the hands. The patient's stride length is normal.  Good arm swing.  Pt is steady   DBS programming is performed today.  This is described on separate procedure note  ASSESSMENT/PLAN:  1.  Essential Tremor  -The patient had a dat [facility] since our last visit.  There was mild asymmetry of uptake in caudates, less on the left than the right of uncertain etiology and could be from prominent perivascular space or from remote lacute but said not typical for PD or any parkinsonian state.  He had an MRI of the brain in 2016 that was unremarkable.  -Patient underwent bilateral VIM DBS on 10/07/2016.  He had bilateral IPG  placement on  10/23/2016.    Unfortunately, he had a cerebral absess on the left and the L system was removed on 03/10/17.  Patient completed IV antibiotics.  Patient subsequently had left VIM DBS on December 04, 2017 with IPG placement on Jan 12, 2018.  Pt is doing much better.  I didn't hear any speech change, nor did his wife but pt perceives it is a bit different.  If persists will call me and will adjust device  -discussed s/s of infection  2. F/u 6 months, at which time we will do post op video.

## 2018-02-02 ENCOUNTER — Ambulatory Visit: Payer: Medicare Other | Admitting: Neurology

## 2018-02-02 ENCOUNTER — Encounter: Payer: Self-pay | Admitting: Family Medicine

## 2018-02-02 ENCOUNTER — Ambulatory Visit (INDEPENDENT_AMBULATORY_CARE_PROVIDER_SITE_OTHER): Payer: Medicare Other | Admitting: Neurology

## 2018-02-02 ENCOUNTER — Encounter: Payer: Self-pay | Admitting: Neurology

## 2018-02-02 VITALS — BP 134/70 | HR 52 | Ht 68.0 in | Wt 207.0 lb

## 2018-02-02 DIAGNOSIS — G250 Essential tremor: Secondary | ICD-10-CM | POA: Diagnosis not present

## 2018-02-02 NOTE — Procedures (Signed)
DBS Programming was performed.    Total time spent programming was 15 minutes.  Device was confirmed to be on.  Soft start was confirmed to be on.  Impedences were checked and were within normal limits.  Battery was checked and was determined to be functioning normally and not near the end of life (3.03 V on the left and 2.97 on the right).  Final settings were as follows:  Left brain electrode:     0-1+; Amplitude 2.2 V; PW 90 microseconds; Frequency 150 Hz (therapy impedence at 1444)  Right brain electrode:     2-1+          ; Amplitude   2.7  V ;  Pulse width 90  microseconds;  Frequency   150    Hz. (therapy impedence at 1476)

## 2018-02-04 ENCOUNTER — Telehealth: Payer: Self-pay | Admitting: Family Medicine

## 2018-02-04 NOTE — Telephone Encounter (Signed)
I left a voicemail in regards to cancelling/rescheduling his appt with Dr. Tamala Julian on 06/02/2018 due to provider leaving.

## 2018-02-17 DIAGNOSIS — H0100B Unspecified blepharitis left eye, upper and lower eyelids: Secondary | ICD-10-CM | POA: Diagnosis not present

## 2018-02-17 DIAGNOSIS — H52223 Regular astigmatism, bilateral: Secondary | ICD-10-CM | POA: Diagnosis not present

## 2018-02-17 DIAGNOSIS — H524 Presbyopia: Secondary | ICD-10-CM | POA: Diagnosis not present

## 2018-02-17 DIAGNOSIS — I10 Essential (primary) hypertension: Secondary | ICD-10-CM | POA: Diagnosis not present

## 2018-02-17 DIAGNOSIS — H5202 Hypermetropia, left eye: Secondary | ICD-10-CM | POA: Diagnosis not present

## 2018-02-17 DIAGNOSIS — H0100A Unspecified blepharitis right eye, upper and lower eyelids: Secondary | ICD-10-CM | POA: Diagnosis not present

## 2018-02-17 DIAGNOSIS — H25813 Combined forms of age-related cataract, bilateral: Secondary | ICD-10-CM | POA: Diagnosis not present

## 2018-03-07 ENCOUNTER — Other Ambulatory Visit: Payer: Self-pay | Admitting: Family Medicine

## 2018-05-11 DIAGNOSIS — J209 Acute bronchitis, unspecified: Secondary | ICD-10-CM | POA: Diagnosis not present

## 2018-05-11 DIAGNOSIS — J019 Acute sinusitis, unspecified: Secondary | ICD-10-CM | POA: Diagnosis not present

## 2018-06-02 ENCOUNTER — Ambulatory Visit: Payer: Self-pay | Admitting: Family Medicine

## 2018-06-30 DIAGNOSIS — G250 Essential tremor: Secondary | ICD-10-CM | POA: Diagnosis not present

## 2018-06-30 DIAGNOSIS — E559 Vitamin D deficiency, unspecified: Secondary | ICD-10-CM | POA: Diagnosis not present

## 2018-06-30 DIAGNOSIS — I10 Essential (primary) hypertension: Secondary | ICD-10-CM | POA: Diagnosis not present

## 2018-06-30 DIAGNOSIS — Z23 Encounter for immunization: Secondary | ICD-10-CM | POA: Diagnosis not present

## 2018-06-30 DIAGNOSIS — D1771 Benign lipomatous neoplasm of kidney: Secondary | ICD-10-CM | POA: Diagnosis not present

## 2018-06-30 DIAGNOSIS — E7800 Pure hypercholesterolemia, unspecified: Secondary | ICD-10-CM | POA: Diagnosis not present

## 2018-08-03 NOTE — Progress Notes (Signed)
Carlos Hunt was seen today in the movement disorders clinic for neurologic consultation at the request of [name], MD.  I have reviewed numerous prior records made available to me.  This patient is accompanied in the office by his spouse who supplements the history.  The patient presents today for a second opinion regarding essential tremor.  He has been seeing [name] in [place] since 03/02/2014 and his last appointment with him was on November 05, 2015.  The patient reports that tremor began slowly, in approximately 2007.  Tremor started in both hands. Pt is R hand dominant but both hands shake. The patient notices tremor most when the hand is in use.  There is a family history of tremor in the patient's father, daughter, and brother, but his brother and father do have a known diagnosis of Parkinson's disease.  On his first visit with [name], the patient was started on primidone, 50 mg twice a day for essential tremor.  At his follow-up 2 months later, the patient reported that tremor was better, although he felt that the medication was causing significant dreams.  He continued on the medication until his February, 2017 follow-up and his primidone was increased to 125 mg twice a day.  Jaw tremor was noted that visit.  The patient called the neurology office a few weeks later because of anxiety and just "felt weird" and primidone was reduced to 75 mg twice a day.  04/24/16 update:  The patient was up today, accompanied by his wife who supplements the history.  Records were reviewed and are made available to me.  The patient had a dat scan at [facility] since our last visit.  There was mild asymmetry of update in caudates, less on the left than the right of uncertain etiology and could be from prominent perivascular space or from remote lacute but said not typical for PD or any parkinsonian state.  He had an MRI of the brain in 2016 that was unremarkable.  09/30/16 update:  Patient follows up  today, accompanied by his wife who supplements the history.  The patient had fiducials placed today in anticipation of DBS surgery for essential tremor.  Surgery is scheduled for one week from today, 10/07/2016.  The patient reports that he is ready for surgery.  He is currently on primidone, 50 mg bid.  Doesn't think that it is helping.  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    11/06/16 update:  Pt f/u today.  He underwent bilateral VIM DBS on 10/07/16.  He had IPG placed on 10/23/16 and did stay one night overnight.  Pt reports that he has recovered well.  No falls since our last visit.  He is no longer on the primidone.  11/27/16 update:  Patient follows up today.  His device was activated about 3 weeks ago.  The patient reports that he has done well.  He denies side effects with the device.  In regards to tremor, he states that he has minor tremor in the dominant hand.  No falls.  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  Something which was taking him 30 minutes is now taking him under 5 minutes.  He asks me to look at a place on the scalp.  03/10/17 update:  Pt seen today with wife who supplements the history.  He called 02/25/17 and said that one week prior he started having tremor and having speech issues.  Pt thinks that the onset was slow but wife thinks that it was acute.  Pt states that it was subacute but didn't tell anyone.  He is having lots of speech trouble, trouble thinking and headaches at the R occiput.  He has trouble describing the headache even when given choices of descriptors.  "I don't think that I am numb but I might be."  Unsure where.  Admits to drooling that is new.  No troubles swallowing.  Feels that he is dragging the right leg.  Tremor on the right hand is a bit worse.  He thought that it was related to DBS but told by our office to have evaluated but has not yet done so.    03/31/17 update:  Pt seen in f/u.  Much has happened.  The records  that were made available to me were reviewed.  He had his L DBS system removed on 03/10/17 due to cerebral abscess. It ultimately grew out propionibacterium.  He is seeing [name].  He has an appt with him on 04/14/17.  He saw [name] last week and while I don't have f/u notes, I did speak to [name] about him.  He felt that he was doing better and wanted to hold off on repeating CT brain until abx were completed.  Pt reports that he feels great.  Speech is back to normal.  Biggest issue is re-emergence of tremor in the right hand.  Asks me about when DBS can be placed back.    07/20/17 update: Patient seen in follow-up for his essential tremor.  I have reviewed his records since our last visit.  He is off of all antibiotics.  He last saw [name] on June 04, 2017.  He had right inguinal and umbilical hernia repair surgery since last visit, completed on July 15, 2017.  Pt feels great and is on no pain medication.  He would like to proceed with placement of DBS again.  He is having difficulty eating because of tremor on the right.  He has significant difficulty eating.  11/17/17 update: Patient is seen today in follow-up for his essential tremor.  He is accompanied by his wife who supplements the history.  He reports that he is doing well, with the exception of having tremor in the right hand because of the fact that the device was removed on the left side due to infection.  He is having difficulty eating.  He very much expresses his desire to have his lead replaced.  He has just changed jobs and is working 2nd shift, full time.    01/25/18 update: Patient is seen today in follow-up for essential tremor.  The patient underwent left VIM DBS on December 04, 2017.  Insertion of battery had to be postponed as when he came in for it, scalp infection was noted.  He was placed on antibiotics and subsequently had his battery on Jan 12, 2018.  Pt reports doing well since then.  Not back to work yet.  Has appt  later today with [name].  Still with tremor in the R hand.  02/02/18 update: Patient seen in follow-up for essential tremor.  He is accompanied by his wife who supplements the history.  His device was activated last week (left VIM).  He reports that he is able to drink water "single handedly" and not needing 2 hands.  He thinks that speech is bit slurry but his wife thinks it sounds the same as previous, as  does his daughter.    08/10/18 update: Patient is seen today for follow-up DBS for essential tremor.   He has been doing well since our last visit.  Working full time, 2nd shift  Neuroimaging has previously been performed.  It is available for my review today.  MRI was done 10/02/14 and was unremarkable.    ALLERGIES:   Allergies  Allergen Reactions  . Procaine Nausea And Vomiting and Other (See Comments)    NOVOCAINE  . Sulfacetamide Sodium-Sulfur Rash    CURRENT MEDICATIONS:  Outpatient Encounter Medications as of 08/09/2018  Medication Sig  . Ascorbic Acid (VITAMIN C) 1000 MG tablet Take 1,000 mg by mouth daily.  Marland Kitchen aspirin 325 MG tablet Take 325 mg by mouth daily.  . Cholecalciferol (VITAMIN D3) 2000 units TABS Take 2,000 Units by mouth daily.  . Cyanocobalamin (B-12) 3000 MCG CAPS Take 3,000 mcg by mouth daily.  . Digestive Enzymes (PAPAYA AND ENZYMES PO) Take 1 tablet by mouth daily as needed (for heartburn).   . fluticasone (FLONASE) 50 MCG/ACT nasal spray Place 1 spray into both nostrils daily as needed for allergies or rhinitis.  . hydrochlorothiazide (HYDRODIURIL) 25 MG tablet Take 1 tablet (25 mg total) by mouth daily.  Marland Kitchen losartan (COZAAR) 50 MG tablet TAKE 1 TABLET BY MOUTH EVERY DAY  . Methylsulfonylmethane (MSM) 1000 MG CAPS Take 1,000 mg by mouth daily.  . Multiple Vitamins-Minerals (CENTRUM PO) Take 1 tablet by mouth daily.   . Tetrahydroz-Glyc-Hyprom-PEG (VISINE MAXIMUM REDNESS RELIEF OP) Place 1 drop into both eyes daily as needed (for irritation).   . [DISCONTINUED]  ECHINACEA PO Take 334 mg by mouth daily.  . [DISCONTINUED] irbesartan (AVAPRO) 150 MG tablet Take 1 tablet (150 mg total) by mouth daily.   No facility-administered encounter medications on file as of 08/09/2018.     PAST MEDICAL HISTORY:   Past Medical History:  Diagnosis Date  . Adenomatous polyps 04/02/2017  . Brain abscess 03/11/2017  . Elevated liver enzymes   . Erectile dysfunction   . Essential hypertension, benign 02/14/2013  . Essential tremor   . GERD (gastroesophageal reflux disease)   . Head injury   . Hx of colonic polyps   . Hypertension   . Pain in left knee   . Pain, joint, shoulder   . PONV (postoperative nausea and vomiting)    nausea no vomiting  . Seasonal allergies   . Testicular hypofunction   . Tobacco use disorder   . Tremor   . Unspecified vitamin D deficiency     PAST SURGICAL HISTORY:   Past Surgical History:  Procedure Laterality Date  . COLONOSCOPY  09/09/2011   normal.  Previous polyps.  [name]. Repeat 5 years.  . COLONOSCOPY N/A 07/18/2016   Procedure: COLONOSCOPY;  Surgeon: Manya Silvas, MD;  Location: [facility] ENDOSCOPY;  Service: Endoscopy;  Laterality: N/A;  . HAND SURGERY Left 2011   trauma  . LAPAROSCOPIC INGUINAL HERNIA WITH UMBILICAL HERNIA Right 16/09/958   Procedure: LAPAROSCOPIC INGUINAL HERNIA REPAIR, right with umbilical herniorrhaphy;  Surgeon: Florene Glen, MD;  Location: [facility] ORS;  Service: General;  Laterality: Right;  . MINOR PLACEMENT OF FIDUCIAL N/A 11/24/2017   Procedure: MINOR PLACEMENT OF FIDUCIAL;  Surgeon: Erline Levine, MD;  Location: [place];  Service: Neurosurgery;  Laterality: N/A;  Fiducial placement  . PULSE GENERATOR IMPLANT Bilateral 10/23/2016   Procedure: BILATERAL IMPLANTABLE PULSE GENERATOR;  Surgeon: Erline Levine, MD;  Location: [place];  Service: Neurosurgery;  Laterality: Bilateral;  BILATERAL  IMPLANTABLE PULSE GENERATOR  . PULSE GENERATOR IMPLANT Left 01/12/2018   Procedure: UNILATERAL PULSE GENERATOR IMPLANT  AND EXTENTIONS FOR DEEP BRAIN STIMULATOR;  Surgeon: Erline Levine, MD;  Location: [place];  Service: Neurosurgery;  Laterality: Left;  . SUBTHALAMIC STIMULATOR INSERTION Bilateral 10/07/2016   Procedure: BILATERAL DEEP BRAIN STIMULATOR PLACEMENT;  Surgeon: Erline Levine, MD;  Location: [place];  Service: Neurosurgery;  Laterality: Bilateral;  BILATERAL DEEP BRAIN STIMULATOR PLACEMENT  . SUBTHALAMIC STIMULATOR INSERTION Left 12/04/2017   Procedure: Left Deep Brain Stimulator Placement;  Surgeon: Erline Levine, MD;  Location: [place];  Service: Neurosurgery;  Laterality: Left;  Left deep brain stimulator placement  . SUBTHALAMIC STIMULATOR REMOVAL Left 03/10/2017   Procedure: REMOVAL DEEP BRAIN STIMULATOR AND ASPIRATION OF BRAIN ABSCESS;  Surgeon: Earnie Larsson, MD;  Location: [place];  Service: Neurosurgery;  Laterality: Left;  . UMBILICAL HERNIA REPAIR N/A 07/15/2017   Procedure: HERNIA REPAIR UMBILICAL ADULT;  Surgeon: Florene Glen, MD;  Location: [facility] ORS;  Service: General;  Laterality: N/A;    SOCIAL HISTORY:   Social History   Socioeconomic History  . Marital status: Married    Spouse name: [name]  . Number of children: 4  . Years of education: Not on file  . Highest education level: Not on file  Occupational History  . Occupation: building maintenance    Comment: Chemical engineer  Social Needs  . Financial resource strain: Not on file  . Food insecurity:    Worry: Not on file    Inability: Not on file  . Transportation needs:    Medical: Not on file    Non-medical: Not on file  Tobacco Use  . Smoking status: Former Smoker    Packs/day: 3.00    Years: 10.00    Pack years: 30.00    Types: Cigarettes    Last attempt to quit: 09/23/1970    Years since quitting: 47.9  . Smokeless tobacco: Never Used  . Tobacco comment: quit in the 1970's  Substance and Sexual Activity  . Alcohol use: Yes    Alcohol/week: 1.0 standard drinks    Types: 1 Shots of liquor per week    Comment:  occasional; once every 6 months  . Drug use: No  . Sexual activity: Yes    Birth control/protection: Post-menopausal  Lifestyle  . Physical activity:    Days per week: Not on file    Minutes per session: Not on file  . Stress: Not on file  Relationships  . Social connections:    Talks on phone: Not on file    Gets together: Not on file    Attends religious service: Not on file    Active member of club or organization: Not on file    Attends meetings of clubs or organizations: Not on file    Relationship status: Not on file  . Intimate partner violence:    Fear of current or ex partner: Not on file    Emotionally abused: Not on file    Physically abused: Not on file    Forced sexual activity: Not on file  Other Topics Concern  . Not on file  Social History Narrative   Marital status: married x 46 years.      Children: 2 daughters, 2 sons; 6 grandchildren.      Lives: with wife.        Employment: RETIRED in 2018 from [employer] x 13 years; moderately happy.  In 2019, working for [employer] in [place] full time.  Tobacco: quit 42 years ago; smoked x 10 years.       Alcohol: socially; weekends.  Rarely.      Drugs: none      Exercise:  None; work is physical.        Seatbelt: 100%      Sunscreen: rare sun exposure      Guns:  4 gun; unloaded, not stored in locked cabinet.       Smoke alarm and carbon monoxide detector in the home.      Caffeine use: consumes a minimal amount       Advanced Directives/Living Will: patient does NOT have Living Will; Does NOT have HCPOA; getting ready to work on it.     Organ Donor: NO          FAMILY HISTORY:   Family Status  Relation Name Status  . Mother  Deceased at age 93       dementia  . Father  Deceased at age 54       PD, heart disease  . Sister  Alive       unknown  . Brother  Deceased at age 66       Parkinson's disease  . Sister  Alive       unknown  . Brother  Alive       unknown  . Brother  Alive       unknown    . Daughter  Alive       tremor  . Son  Alive       healthy    ROS: Review of Systems  Constitutional: Negative.   HENT: Negative.   Eyes: Negative.   Respiratory: Negative.   Cardiovascular: Negative.   Gastrointestinal: Negative.   Genitourinary: Negative.   Skin: Negative.      PHYSICAL EXAMINATION:    VITALS:   Vitals:   08/09/18 0809  BP: (!) 154/72  Pulse: (!) 52  SpO2: 97%  Weight: 190 lb (86.2 kg)  Height: 5' 11"  (1.803 m)   GEN:  The patient appears stated age and is in NAD. HEENT:  Normocephalic, atraumatic.  The mucous membranes are moist. The superficial temporal arteries are without ropiness or tenderness. CV:  RRR Lungs:  CTAB Neck/HEME:  There are no carotid bruits bilaterally.  Neurological examination:  Orientation: The patient is alert and oriented x3. Cranial nerves: There is good facial symmetry. The speech is fluent and clear. Soft palate rises symmetrically and there is no tongue deviation. Hearing is intact to conversational tone. Sensation: Sensation is intact to light touch throughout Motor: Strength is 5/5 in the bilateral upper and lower extremities.   Shoulder shrug is equal and symmetric.  There is no pronator drift.   Movement examination: Tone: There is normal tone in the UE/LE Abnormal movements: there is no postural tremor.  No intention tremor.   he has no difficulty with archimedes spirals.  he has no difficulty when asked to pour a full glass of water from one glass to another. Coordination:  There is no decremation with RAM's, with any form of RAMS, including alternating supination and pronation of the forearm, hand opening and closing, finger taps, heel taps and toe taps. Gait and Station: The patient has no difficulty arising out of a deep-seated chair without the use of the hands. The patient's stride length is normal.  Good arm swing.  Pt is steady  DBS programming is performed today.  This is described on separate procedure  note  ASSESSMENT/PLAN:  1.  Essential Tremor  -Patient underwent bilateral VIM DBS on 10/07/2016.  He had bilateral IPG placement on 10/23/2016.    Unfortunately, he had a cerebral absess on the left and the L system was removed on 03/10/17.  Patient completed IV antibiotics.  Patient subsequently had left VIM DBS on December 04, 2017 with IPG placement on Jan 12, 2018.    -post op video done today 2. F/u 6 months

## 2018-08-09 ENCOUNTER — Encounter: Payer: Self-pay | Admitting: Neurology

## 2018-08-09 ENCOUNTER — Ambulatory Visit (INDEPENDENT_AMBULATORY_CARE_PROVIDER_SITE_OTHER): Payer: Medicare Other | Admitting: Neurology

## 2018-08-09 VITALS — BP 154/72 | HR 52 | Ht 71.0 in | Wt 190.0 lb

## 2018-08-09 DIAGNOSIS — G250 Essential tremor: Secondary | ICD-10-CM | POA: Diagnosis not present

## 2018-08-09 NOTE — Procedures (Signed)
DBS Programming was performed.    Total time spent programming was 5 minutes.  Device was confirmed to be on.  Soft start was confirmed to be on.  Impedences were checked and were within normal limits.  Battery was checked and was determined to be functioning normally and not near the end of life (2.98 V on the left and 2.96 on the right).  Final settings were as follows:  Left brain electrode:     0-1+; Amplitude 2.2 V; PW 90 microseconds; Frequency 150 Hz (therapy impedence at 1730)  Right brain electrode:     2-1+          ; Amplitude   2.7  V ;  Pulse width 90  microseconds;  Frequency   150    Hz. (therapy impedence at 1487)

## 2018-11-02 IMAGING — CT CT HEAD WO/W CM
3 of 4 series · 15 of 47 positions shown, 18 images · IV contrast (iopamidol)
Comparison: Head CT 03/10/2017

CLINICAL DATA: Brain abscess. Removal of subthalamic stimulator
lead.

EXAM:
CT HEAD WITHOUT AND WITH CONTRAST
TECHNIQUE: Contiguous axial images were obtained from the base of the skull
through the vertex without and with intravenous contrast
CONTRAST:  75mL BPLSFK-GDD IOPAMIDOL (BPLSFK-GDD) INJECTION 61%

[Series 3: head without without · axial · non-contrast · 0.41mm/px · z∈[-157,-17]mm · 9 of 34 slices shown, 12 images]
[im 3/34  brain]
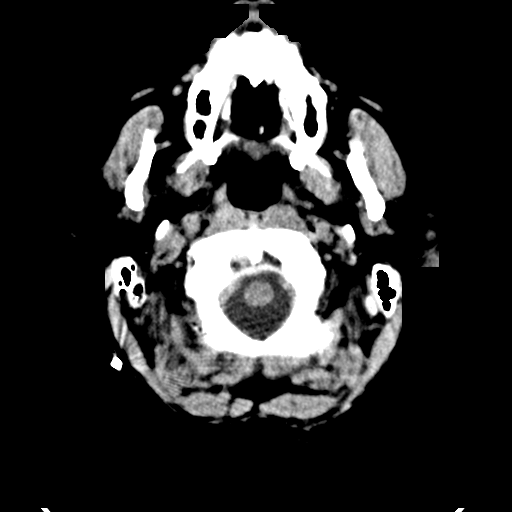
[im 3/34  bone]
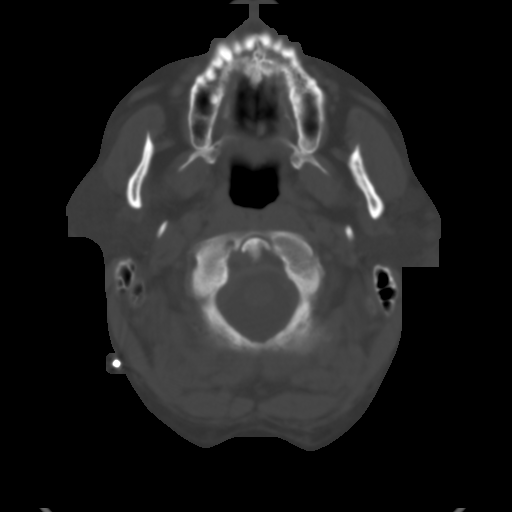
[im 8/34  brain]
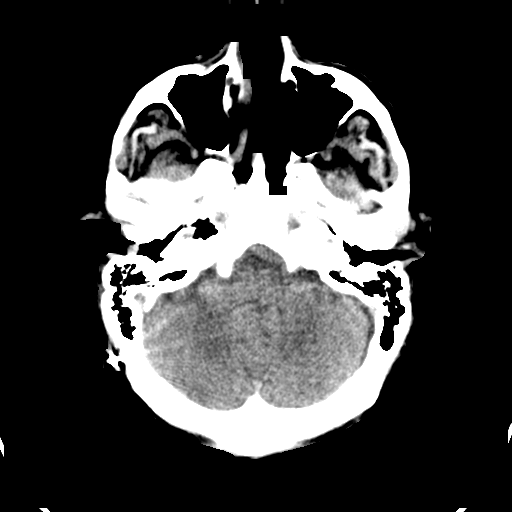
[im 10/34  brain]
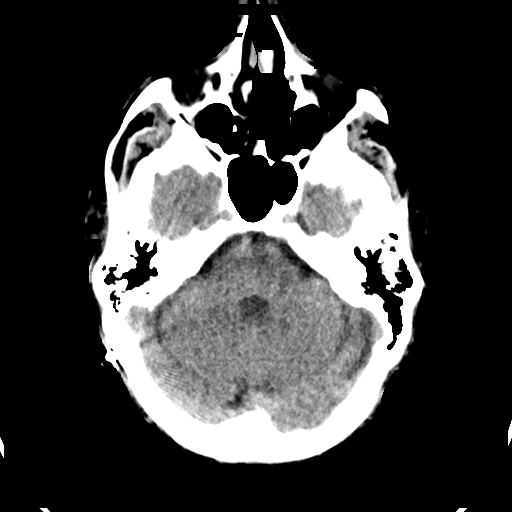
[im 15/34  brain]
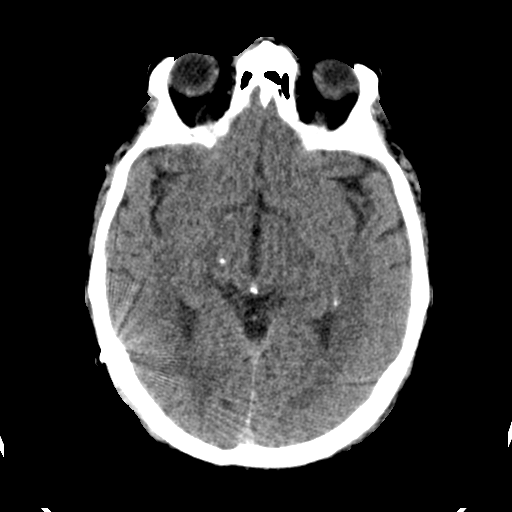
[im 17/34  brain]
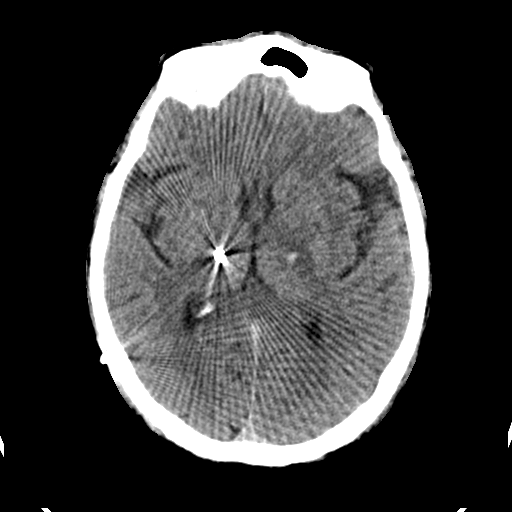
[im 17/34  bone]
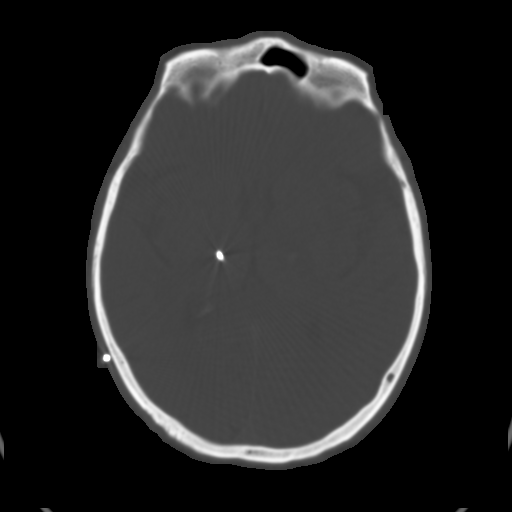
[im 19/34  brain]
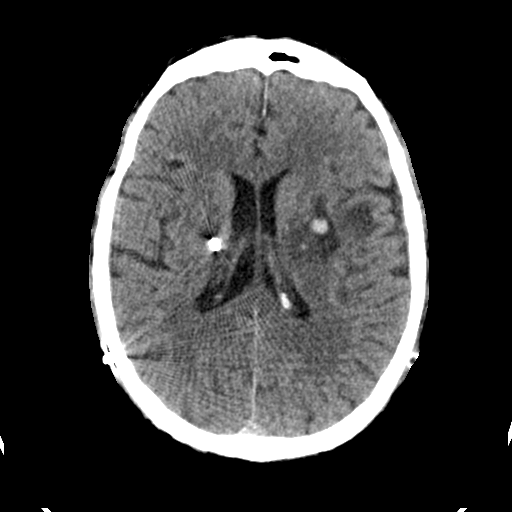
[im 24/34  brain]
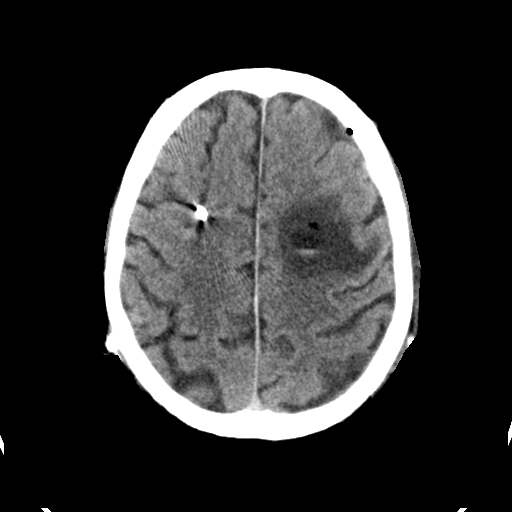
[im 26/34  brain]
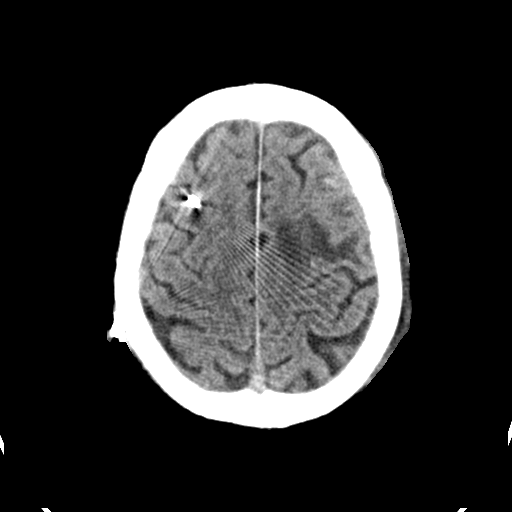
[im 31/34  brain]
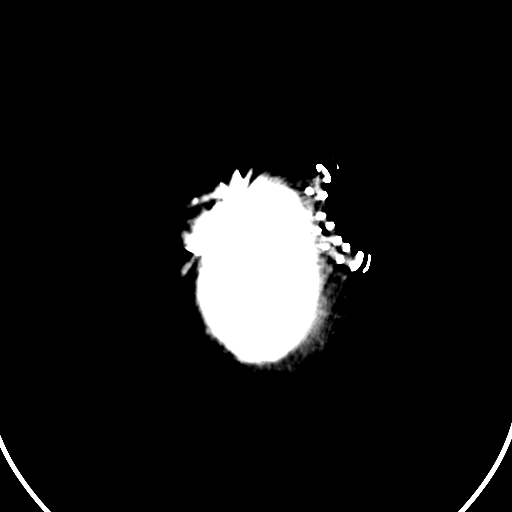
[im 31/34  bone]
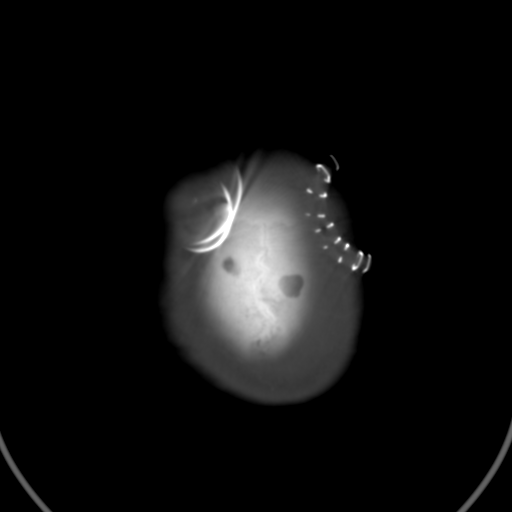

[Series 8: head with cor · coronal · 0.33mm/px · 3 of 68 slices shown]
[im 23/68  brain]
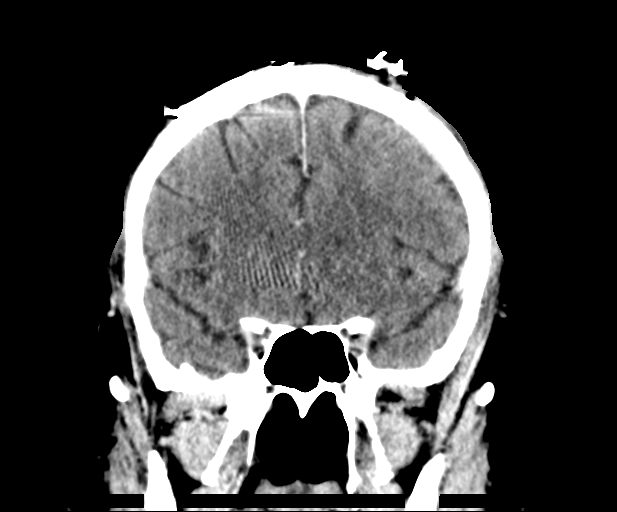
[im 30/68  brain]
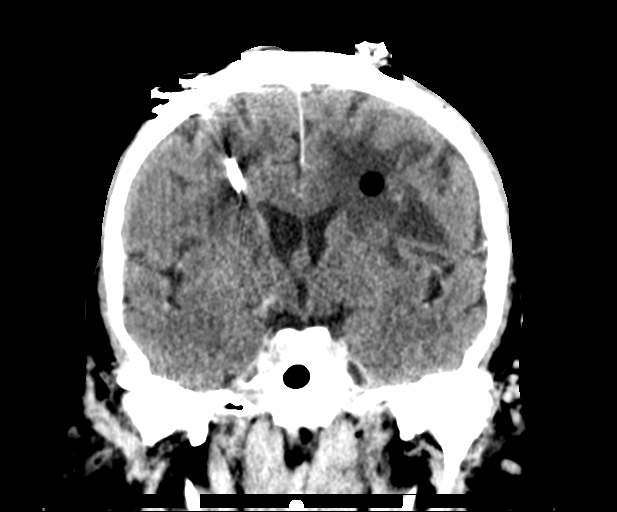
[im 38/68  brain]
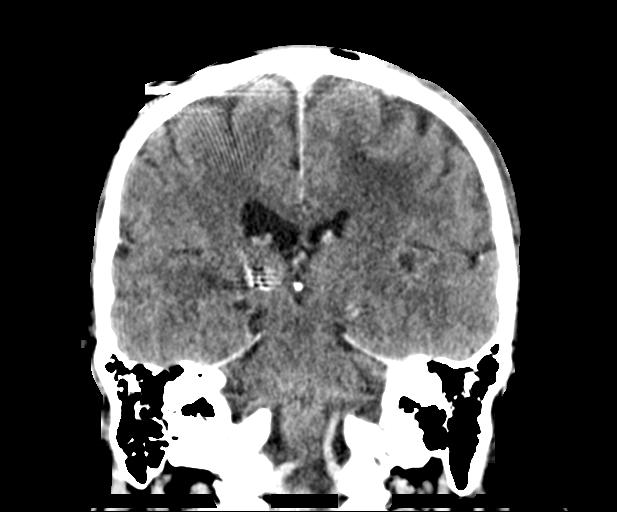

[Series 9: head with sag · sagittal · 0.35mm/px · 3 of 60 slices shown]
[im 20/60  brain]
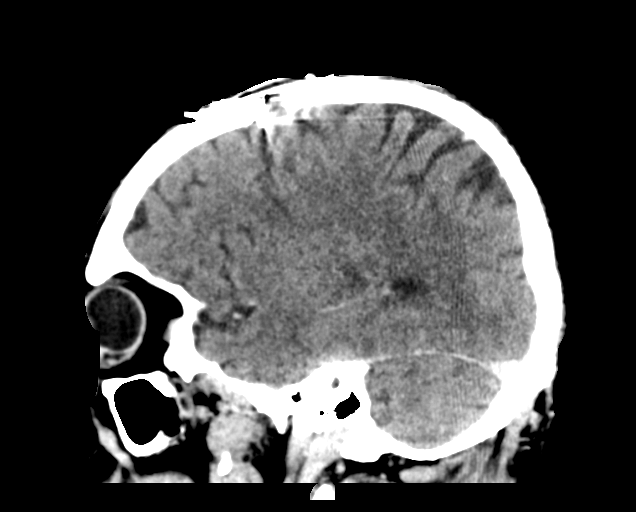
[im 30/60  brain]
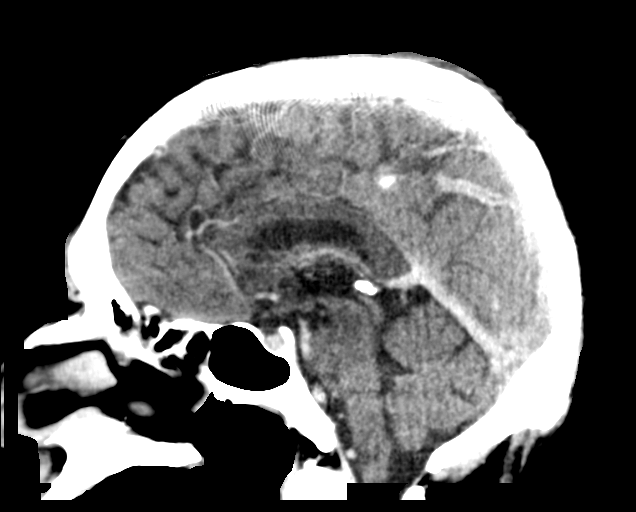
[im 40/60  brain]
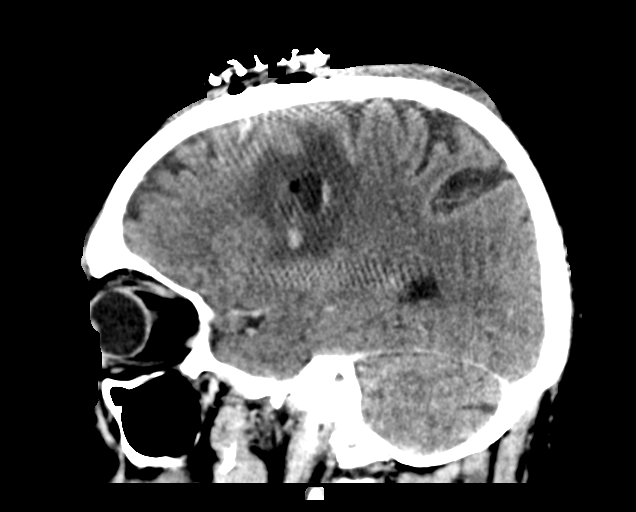

[15 of 47 positions shown; findings below may reference images not displayed]

FINDINGS: Brain: The left deep brain stimulator lead has been removed. There
is Vernell Malget amount of hemorrhage along the lead tract. This measures
approximately 8 x 5 mm just inferior to a fluid-filled cavity within
the left frontal white matter that contains Vernell Malget amount of air
and blood. The cavity measures 18 x 16 mm, unchanged. Surrounding
edema is also unchanged. There is no enhancement on postcontrast
images. The right subthalamic lead is in normal position without
surrounding abnormality.

Vascular: No hyperdense vessel or unexpected calcification.

Skull: Bifrontal burr holes.

Sinuses/Orbits: No sinus fluid levels or advanced mucosal
thickening. No mastoid effusion. Normal orbits.
IMPRESSION: 1. Removal of left subthalamic lead with small amount of hemorrhage
along the tract, measuring up to 8 x 5 mm.
2. Unchanged size of fluid containing cavity in the left frontal
white matter, which now also contains Vernell Malget amount of post
procedural blood and air. Surrounding edema is unchanged. There is
no peripheral contrast enhancement, but this does not exclude the
possibility of infection/abscess.

## 2018-12-23 ENCOUNTER — Other Ambulatory Visit: Payer: Self-pay | Admitting: Urology

## 2018-12-23 DIAGNOSIS — N2889 Other specified disorders of kidney and ureter: Secondary | ICD-10-CM

## 2019-01-10 ENCOUNTER — Telehealth: Payer: Self-pay | Admitting: Urology

## 2019-01-10 NOTE — Telephone Encounter (Signed)
Patient can't do a virtual app due to him going back to work. Do you want to call him with RUS results or do an OV on 01-18-19?  Sharyn Lull

## 2019-01-10 NOTE — Telephone Encounter (Signed)
Can call with results

## 2019-01-12 ENCOUNTER — Other Ambulatory Visit: Payer: Self-pay

## 2019-01-12 ENCOUNTER — Ambulatory Visit
Admission: RE | Admit: 2019-01-12 | Discharge: 2019-01-12 | Disposition: A | Discharge status: Home / Self Care | Payer: Medicare HMO | Source: Ambulatory Visit | Attending: Urology | Admitting: Urology

## 2019-01-12 DIAGNOSIS — N2889 Other specified disorders of kidney and ureter: Secondary | ICD-10-CM | POA: Diagnosis not present

## 2019-01-13 ENCOUNTER — Other Ambulatory Visit: Payer: Self-pay | Admitting: Urology

## 2019-01-13 ENCOUNTER — Encounter: Payer: Self-pay | Admitting: Urology

## 2019-01-13 DIAGNOSIS — D179 Benign lipomatous neoplasm, unspecified: Secondary | ICD-10-CM

## 2019-01-13 DIAGNOSIS — E139 Other specified diabetes mellitus without complications: Secondary | ICD-10-CM

## 2019-01-13 DIAGNOSIS — N281 Cyst of kidney, acquired: Secondary | ICD-10-CM

## 2019-01-21 ENCOUNTER — Ambulatory Visit: Payer: Medicare Other | Admitting: Urology

## 2019-02-07 NOTE — Progress Notes (Signed)
Carlos Hunt was seen today in the movement disorders clinic for neurologic consultation at the request of Carlos Julian [name], MD.  I have reviewed numerous prior records made available to me.  This patient is accompanied in the office by his spouse who supplements the history.  The patient presents today for a second opinion regarding essential tremor.  He has been seeing Dr. Manuella Hunt in [place] since 03/02/2014 and his last appointment with him was on November 05, 2015.  The patient reports that tremor began slowly, in approximately 2007.  Tremor started in both hands. Pt is R hand dominant but both hands shake. The patient notices tremor most when the hand is in use.  There is a family history of tremor in the patient's father, daughter, and brother, but his brother and father do have a known diagnosis of Parkinson's disease.  On his first visit with Dr. Manuella Hunt, the patient was started on primidone, 50 mg twice a day for essential tremor.  At his follow-up 2 months later, the patient reported that tremor was better, although he felt that the medication was causing significant dreams.  He continued on the medication until his February, 2017 follow-up and his primidone was increased to 125 mg twice a day.  Jaw tremor was noted that visit.  The patient called the neurology office a few weeks later because of anxiety and just "felt weird" and primidone was reduced to 75 mg twice a day.  04/24/16 update:  The patient was up today, accompanied by his wife who supplements the history.  Records were reviewed and are made available to me.  The patient had a dat scan at [facility] since our last visit.  There was mild asymmetry of update in caudates, less on the left than the right of uncertain etiology and could be from prominent perivascular space or from remote lacute but said not typical for PD or any parkinsonian state.  He had an MRI of the brain in 2016 that was unremarkable.  09/30/16 update:  Patient follows up  today, accompanied by his wife who supplements the history.  The patient had fiducials placed today in anticipation of DBS surgery for essential tremor.  Surgery is scheduled for one week from today, 10/07/2016.  The patient reports that he is ready for surgery.  He is currently on primidone, 50 mg bid.  Doesn't think that it is helping.  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    11/06/16 update:  Pt f/u today.  He underwent bilateral VIM DBS on 10/07/16.  He had IPG placed on 10/23/16 and did stay one night overnight.  Pt reports that he has recovered well.  No falls since our last visit.  He is no longer on the primidone.  11/27/16 update:  Patient follows up today.  His device was activated about 3 weeks ago.  The patient reports that he has done well.  He denies side effects with the device.  In regards to tremor, he states that he has minor tremor in the dominant hand.  No falls.  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  Something which was taking him 30 minutes is now taking him under 5 minutes.  He asks me to look at a place on the scalp.  03/10/17 update:  Pt seen today with wife who supplements the history.  He called 02/25/17 and said that one week prior he started having tremor and having speech issues.  Pt thinks that the onset was slow but wife thinks that it was acute.  Pt states that it was subacute but didn't tell anyone.  He is having lots of speech trouble, trouble thinking and headaches at the R occiput.  He has trouble describing the headache even when given choices of descriptors.  "I don't think that I am numb but I might be."  Unsure where.  Admits to drooling that is new.  No troubles swallowing.  Feels that he is dragging the right leg.  Tremor on the right hand is a bit worse.  He thought that it was related to DBS but told by our office to have evaluated but has not yet done so.    03/31/17 update:  Pt seen in f/u.  Much has happened.  The records  that were made available to me were reviewed.  He had his L DBS system removed on 03/10/17 due to cerebral abscess. It ultimately grew out propionibacterium.  He is seeing Dr. Megan Hunt.  He has an appt with him on 04/14/17.  He saw Dr. Vertell Hunt last week and while I don't have f/u notes, I did speak to Dr. Vertell Hunt about him.  He felt that he was doing better and wanted to hold off on repeating CT brain until abx were completed.  Pt reports that he feels great.  Speech is back to normal.  Biggest issue is re-emergence of tremor in the right hand.  Asks me about when DBS can be placed back.    07/20/17 update: Patient seen in follow-up for his essential tremor.  I have reviewed his records since our last visit.  He is off of all antibiotics.  He last saw Dr. Megan Hunt on June 04, 2017.  He had right inguinal and umbilical hernia repair surgery since last visit, completed on July 15, 2017.  Pt feels great and is on no pain medication.  He would like to proceed with placement of DBS again.  He is having difficulty eating because of tremor on the right.  He has significant difficulty eating.  11/17/17 update: Patient is seen today in follow-up for his essential tremor.  He is accompanied by his wife who supplements the history.  He reports that he is doing well, with the exception of having tremor in the right hand because of the fact that the device was removed on the left side due to infection.  He is having difficulty eating.  He very much expresses his desire to have his lead replaced.  He has just changed jobs and is working 2nd shift, full time.    01/25/18 update: Patient is seen today in follow-up for essential tremor.  The patient underwent left VIM DBS on December 04, 2017.  Insertion of battery had to be postponed as when he came in for it, scalp infection was noted.  He was placed on antibiotics and subsequently had his battery on Jan 12, 2018.  Pt reports doing well since then.  Not back to work yet.  Has appt  later today with Dr. Vertell Hunt.  Still with tremor in the R hand.  02/02/18 update: Patient seen in follow-up for essential tremor.  He is accompanied by his wife who supplements the history.  His device was activated last week (left VIM).  He reports that he is able to drink water "single handedly" and not needing 2 hands.  He thinks that speech is bit slurry but his wife thinks it sounds the same as previous, as  does his daughter.    08/10/18 update: Patient is seen today for follow-up DBS for essential tremor.   He has been doing well since our last visit.  Working full time, 2nd shift  02/08/19 update: Patient is seen today for follow-up for DBS for essential tremor.  I last saw him about 6 months ago.  Patient reports that tremor has been well controlled.  Medical records have been reviewed since last visit.  He has followed up with urology and had a renal ultrasound in regards to his angiomyolipoma.  This has not changed in size.  Neuroimaging has previously been performed.  It is available for my review today.  MRI was done 10/02/14 and was unremarkable.    ALLERGIES:   Allergies  Allergen Reactions  . Procaine Nausea And Vomiting and Other (See Comments)    NOVOCAINE  . Sulfacetamide Sodium-Sulfur Rash    CURRENT MEDICATIONS:  Outpatient Encounter Medications as of 02/08/2019  Medication Sig  . Ascorbic Acid (VITAMIN C) 1000 MG tablet Take 1,000 mg by mouth daily.  Marland Kitchen aspirin 325 MG tablet Take 325 mg by mouth daily.  . Cholecalciferol (VITAMIN D3) 2000 units TABS Take 2,000 Units by mouth daily.  . Cyanocobalamin (B-12) 3000 MCG CAPS Take 3,000 mcg by mouth daily.  . Digestive Enzymes (PAPAYA AND ENZYMES PO) Take 1 tablet by mouth daily as needed (for heartburn).   . fluticasone (FLONASE) 50 MCG/ACT nasal spray Place 1 spray into both nostrils daily as needed for allergies or rhinitis.  . hydrochlorothiazide (HYDRODIURIL) 25 MG tablet Take 1 tablet (25 mg total) by mouth daily.  Marland Kitchen losartan  (COZAAR) 50 MG tablet TAKE 1 TABLET BY MOUTH EVERY DAY  . Methylsulfonylmethane (MSM) 1000 MG CAPS Take 1,000 mg by mouth daily.  . Multiple Vitamins-Minerals (CENTRUM PO) Take 1 tablet by mouth daily.   . Tetrahydroz-Glyc-Hyprom-PEG (VISINE MAXIMUM REDNESS RELIEF OP) Place 1 drop into both eyes daily as needed (for irritation).    No facility-administered encounter medications on file as of 02/08/2019.     PAST MEDICAL HISTORY:   Past Medical History:  Diagnosis Date  . Adenomatous polyps 04/02/2017  . Brain abscess 03/11/2017  . Elevated liver enzymes   . Erectile dysfunction   . Essential hypertension, benign 02/14/2013  . Essential tremor   . GERD (gastroesophageal reflux disease)   . Head injury   . Hx of colonic polyps   . Hypertension   . Pain in left knee   . Pain, joint, shoulder   . PONV (postoperative nausea and vomiting)    nausea no vomiting  . Seasonal allergies   . Testicular hypofunction   . Tobacco use disorder   . Tremor   . Unspecified vitamin D deficiency     PAST SURGICAL HISTORY:   Past Surgical History:  Procedure Laterality Date  . COLONOSCOPY  09/09/2011   normal.  Previous polyps.  [name]. Repeat 5 years.  . COLONOSCOPY N/A 07/18/2016   Procedure: COLONOSCOPY;  Surgeon: Manya Silvas, MD;  Location: [facility];  Service: Endoscopy;  Laterality: N/A;  . HAND SURGERY Left 2011   trauma  . LAPAROSCOPIC INGUINAL HERNIA WITH UMBILICAL HERNIA Right 24/01/8098   Procedure: LAPAROSCOPIC INGUINAL HERNIA REPAIR, right with umbilical herniorrhaphy;  Surgeon: Florene Glen, MD;  Location: [facility];  Service: General;  Laterality: Right;  . MINOR PLACEMENT OF FIDUCIAL N/A 11/24/2017   Procedure: MINOR PLACEMENT OF FIDUCIAL;  Surgeon: Erline Levine, MD;  Location: [place];  Service: Neurosurgery;  Laterality: N/A;  Fiducial placement  . PULSE GENERATOR IMPLANT Bilateral 10/23/2016   Procedure: BILATERAL IMPLANTABLE PULSE GENERATOR;  Surgeon: Erline Levine, MD;   Location: [place];  Service: Neurosurgery;  Laterality: Bilateral;  BILATERAL IMPLANTABLE PULSE GENERATOR  . PULSE GENERATOR IMPLANT Left 01/12/2018   Procedure: UNILATERAL PULSE GENERATOR IMPLANT AND EXTENTIONS FOR DEEP BRAIN STIMULATOR;  Surgeon: Erline Levine, MD;  Location: [place];  Service: Neurosurgery;  Laterality: Left;  . SUBTHALAMIC STIMULATOR INSERTION Bilateral 10/07/2016   Procedure: BILATERAL DEEP BRAIN STIMULATOR PLACEMENT;  Surgeon: Erline Levine, MD;  Location: [place];  Service: Neurosurgery;  Laterality: Bilateral;  BILATERAL DEEP BRAIN STIMULATOR PLACEMENT  . SUBTHALAMIC STIMULATOR INSERTION Left 12/04/2017   Procedure: Left Deep Brain Stimulator Placement;  Surgeon: Erline Levine, MD;  Location: [place];  Service: Neurosurgery;  Laterality: Left;  Left deep brain stimulator placement  . SUBTHALAMIC STIMULATOR REMOVAL Left 03/10/2017   Procedure: REMOVAL DEEP BRAIN STIMULATOR AND ASPIRATION OF BRAIN ABSCESS;  Surgeon: Earnie Larsson, MD;  Location: [place];  Service: Neurosurgery;  Laterality: Left;  . UMBILICAL HERNIA REPAIR N/A 07/15/2017   Procedure: HERNIA REPAIR UMBILICAL ADULT;  Surgeon: Florene Glen, MD;  Location: [facility];  Service: General;  Laterality: N/A;    SOCIAL HISTORY:   Social History   Socioeconomic History  . Marital status: Married    Spouse name: [name]  . Number of children: 4  . Years of education: Not on file  . Highest education level: Not on file  Occupational History  . Occupation: building maintenance    Comment: Chemical engineer  Social Needs  . Financial resource strain: Not on file  . Food insecurity:    Worry: Not on file    Inability: Not on file  . Transportation needs:    Medical: Not on file    Non-medical: Not on file  Tobacco Use  . Smoking status: Former Smoker    Packs/day: 3.00    Years: 10.00    Pack years: 30.00    Types: Cigarettes    Last attempt to quit: 09/23/1970    Years since quitting: 48.4  . Smokeless tobacco: Never  Used  . Tobacco comment: quit in the 1970's  Substance and Sexual Activity  . Alcohol use: Yes    Alcohol/week: 1.0 standard drinks    Types: 1 Shots of liquor per week    Comment: occasional; once every 6 months  . Drug use: No  . Sexual activity: Yes    Birth control/protection: Post-menopausal  Lifestyle  . Physical activity:    Days per week: Not on file    Minutes per session: Not on file  . Stress: Not on file  Relationships  . Social connections:    Talks on phone: Not on file    Gets together: Not on file    Attends religious service: Not on file    Active member of club or organization: Not on file    Attends meetings of clubs or organizations: Not on file    Relationship status: Not on file  . Intimate partner violence:    Fear of current or ex partner: Not on file    Emotionally abused: Not on file    Physically abused: Not on file    Forced sexual activity: Not on file  Other Topics Concern  . Not on file  Social History Narrative   Marital status: married x 46 years.      Children: 2 daughters, 2 sons; 6 grandchildren.  Lives: with wife.        Employment: RETIRED in 2018 from [place] x 13 years; moderately happy.  In 2019, working for [employer] in [place] full time.      Tobacco: quit 42 years ago; smoked x 10 years.       Alcohol: socially; weekends.  Rarely.      Drugs: none      Exercise:  None; work is physical.        Seatbelt: 100%      Sunscreen: rare sun exposure      Guns:  4 gun; unloaded, not stored in locked cabinet.       Smoke alarm and carbon monoxide detector in the home.      Caffeine use: consumes a minimal amount       Advanced Directives/Living Will: patient does NOT have Living Will; Does NOT have HCPOA; getting ready to work on it.     Organ Donor: NO          FAMILY HISTORY:   Family Status  Relation Name Status  . Mother  Deceased at age 68       dementia  . Father  Deceased at age 32       PD, heart disease  .  Sister  Alive       unknown  . Brother  Deceased at age 41       Parkinson's disease  . Sister  Alive       unknown  . Brother  Alive       unknown  . Brother  Alive       unknown  . Daughter  Alive       tremor  . Son  Alive       healthy    ROS: Review of Systems  Constitutional: Negative.   HENT: Negative.   Eyes: Negative.   Respiratory: Negative.   Cardiovascular: Negative.   Skin: Negative.      PHYSICAL EXAMINATION:    VITALS:   Vitals:   02/08/19 1301  BP: (!) 170/70  Pulse: 62  Weight: 198 lb (89.8 kg)  Height: 5' 11"  (1.803 m)   (pharmacy ran out of his BP med so BP is high)  GEN:  The patient appears stated age and is in NAD. HEENT:  Normocephalic, atraumatic.  The mucous membranes are moist. The superficial temporal arteries are without ropiness or tenderness. CV:  RRR Lungs:  CTAB Neck/HEME:  There are no carotid bruits bilaterally.  Neurological examination:  Orientation: The patient is alert and oriented x3. Cranial nerves: There is good facial symmetry. The speech is fluent and clear. Soft palate rises symmetrically and there is no tongue deviation. Hearing is intact to conversational tone. Sensation: Sensation is intact to light touch throughout Motor: Strength is 5/5 in the bilateral upper and lower extremities.   Shoulder shrug is equal and symmetric.  There is no pronator drift.  Movement examination: Tone: There is normal tone in the upper and lower extremities Abnormal movements: There is no rest tremor.  There is no postural tremor with the device on.  With the device off, he does have tremor that reemerges. Coordination:  There is no decremation with RAM's, with hand opening and closing her finger taps bilaterally. Gait and Station: The patient ambulates well in the hall.   DBS programming is performed today.  This is described on separate procedure note  ASSESSMENT/PLAN:  1.  Essential Tremor  -Patient underwent bilateral  VIM DBS  on 10/07/2016.  He had bilateral IPG placement on 10/23/2016.    Unfortunately, he had a cerebral absess on the left and the L system was removed on 03/10/17.  Patient completed IV antibiotics.  Patient subsequently had left VIM DBS on December 04, 2017 with IPG placement on Jan 12, 2018.    -Patient is currently off of medications.  Better he looks good today.  -Shown again how to use the patient programmer.  Discussed when it would be important to use it.   2.  Renal cysts  -Has a stable, benign angiomyolipoma that is being followed by urology.  3.  Hypertension  -Blood pressure was somewhat elevated today, but he reports that pharmacy ran out of his medication.  He is to get back on medication.  4.  Follow-up 6 to 8 months.

## 2019-02-08 ENCOUNTER — Other Ambulatory Visit: Payer: Self-pay

## 2019-02-08 ENCOUNTER — Encounter: Payer: Self-pay | Admitting: Neurology

## 2019-02-08 ENCOUNTER — Ambulatory Visit (INDEPENDENT_AMBULATORY_CARE_PROVIDER_SITE_OTHER): Payer: Medicare HMO | Admitting: Neurology

## 2019-02-08 VITALS — BP 170/70 | HR 62 | Ht 71.0 in | Wt 198.0 lb

## 2019-02-08 DIAGNOSIS — D1771 Benign lipomatous neoplasm of kidney: Secondary | ICD-10-CM

## 2019-02-08 DIAGNOSIS — G250 Essential tremor: Secondary | ICD-10-CM | POA: Diagnosis not present

## 2019-02-08 DIAGNOSIS — I10 Essential (primary) hypertension: Secondary | ICD-10-CM | POA: Diagnosis not present

## 2019-02-08 NOTE — Procedures (Signed)
DBS Programming was performed.    Total time spent programming was 8 minutes.  Device was confirmed to be on.  Soft start was confirmed to be on.  Impedences were checked and were within normal limits.  Battery was checked and was determined to be functioning normally and not near the end of life (2.98 V on the left and 2.95 on the right).  Final settings were as follows:  Left brain electrode:     0-1+; Amplitude 2.2 V; PW 90 microseconds; Frequency 150 Hz (therapy impedence at 1689)  Right brain electrode:     2-1+          ; Amplitude   2.7  V ;  Pulse width 90  microseconds;  Frequency   150    Hz. (therapy impedence at 1563)

## 2019-07-13 IMAGING — MR MR HEAD WO/W CM
2 series · 48 of 48 positions shown · IV contrast (multihance)
Comparison: CT 07/21/2017 and 03/10/2017.  MRI 10/07/2016.

CLINICAL DATA: Deep brain stimulator revision after infection on
the left. Exam restricted to axial SPGR imaging.

EXAM:
MRI HEAD WITHOUT AND WITH CONTRAST
TECHNIQUE: Multiplanar, multiecho pulse sequences of the brain and surrounding
structures were obtained without and with intravenous contrast.
CONTRAST:  15 cc MultiHance

[Series 3: ax fspgr 3d · axial · 1.0mm · 0.94mm/px · z∈[-49,+103]mm · 24 of 160 slices shown]
[im 1/160]
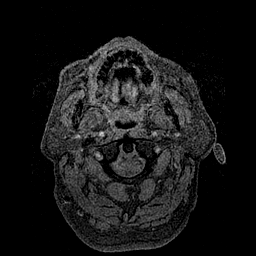
[im 7/160]
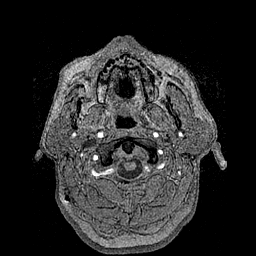
[im 14/160]
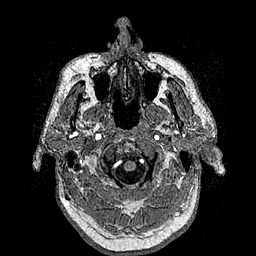
[im 21/160]
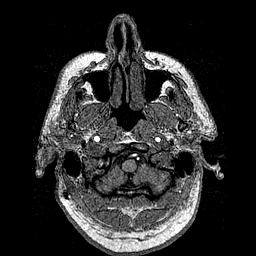
[im 28/160]
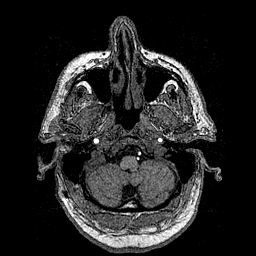
[im 35/160]
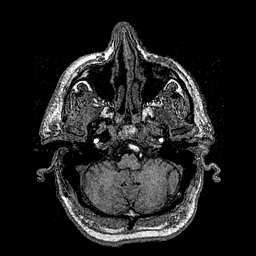
[im 42/160]
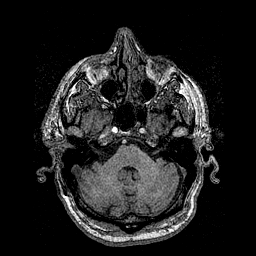
[im 49/160]
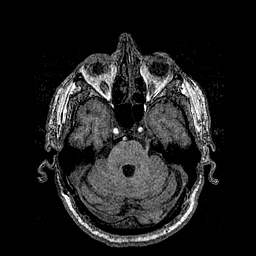
[im 56/160]
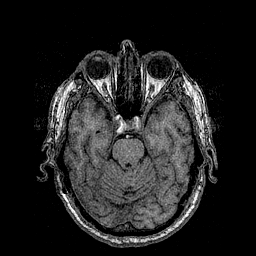
[im 63/160]
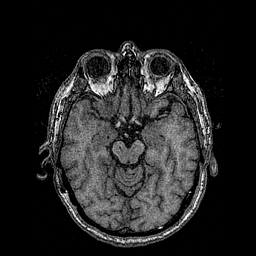
[im 70/160]
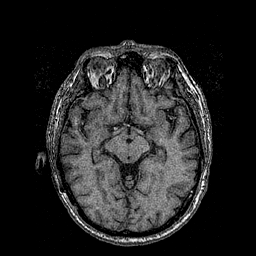
[im 77/160]
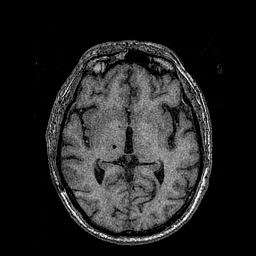
[im 83/160]
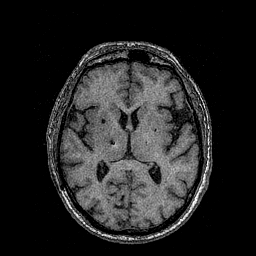
[im 90/160]
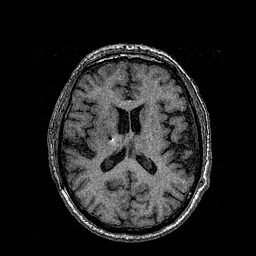
[im 97/160]
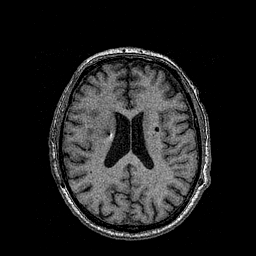
[im 104/160]
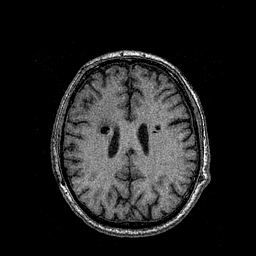
[im 111/160]
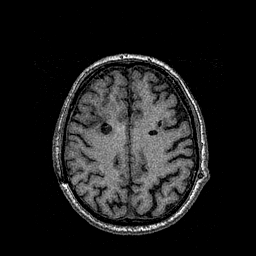
[im 118/160]
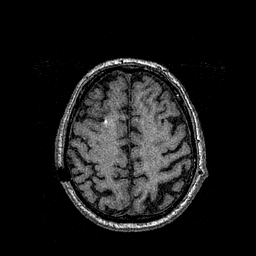
[im 125/160]
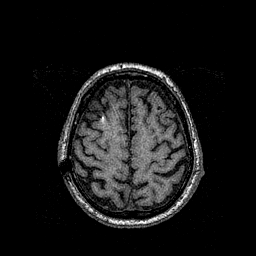
[im 132/160]
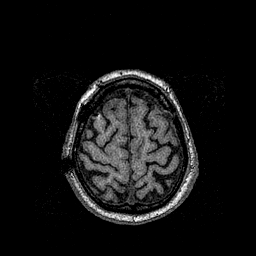
[im 139/160]
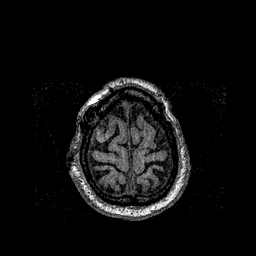
[im 146/160]
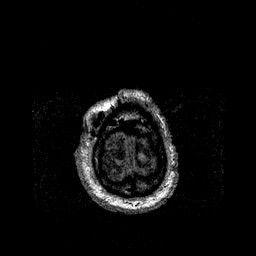
[im 153/160]
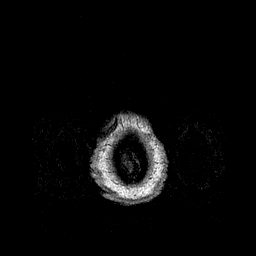
[im 160/160]
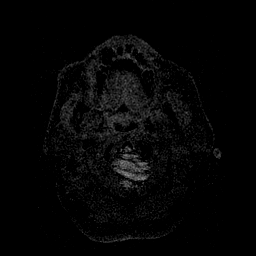

[Series 4: ax (post fspgr · axial · 1.0mm · 0.94mm/px · z∈[-49,+103]mm · 24 of 160 slices shown]
[im 1/160]
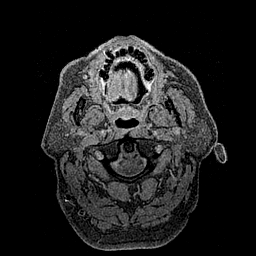
[im 7/160]
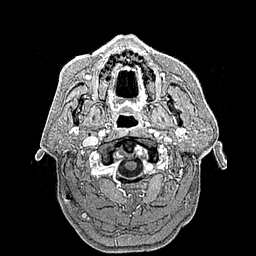
[im 14/160]
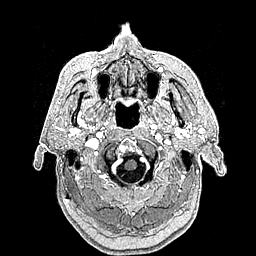
[im 21/160]
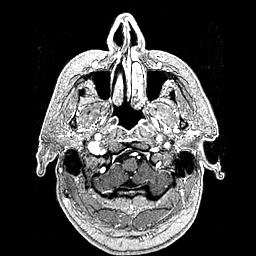
[im 28/160]
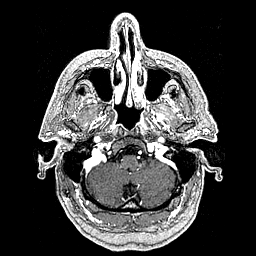
[im 35/160]
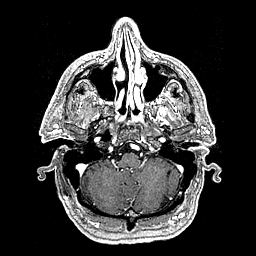
[im 42/160]
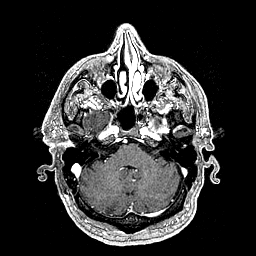
[im 49/160]
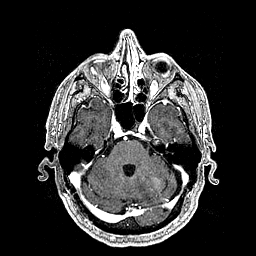
[im 56/160]
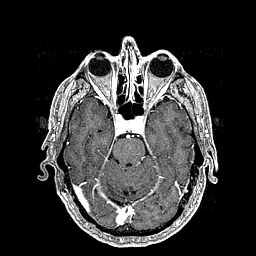
[im 63/160]
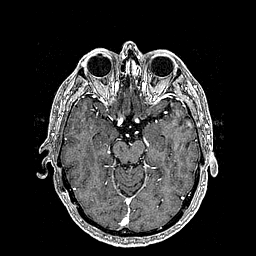
[im 70/160]
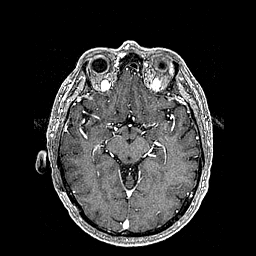
[im 77/160]
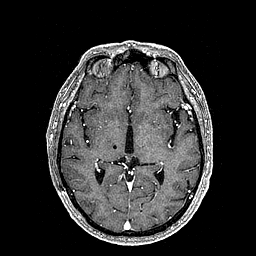
[im 83/160]
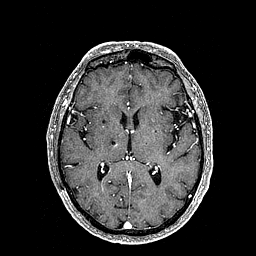
[im 90/160]
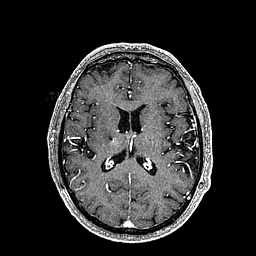
[im 97/160]
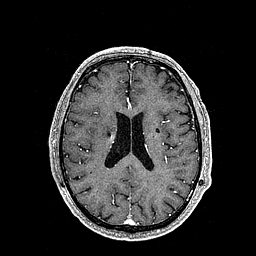
[im 104/160]
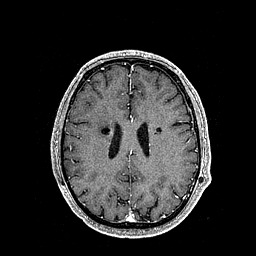
[im 111/160]
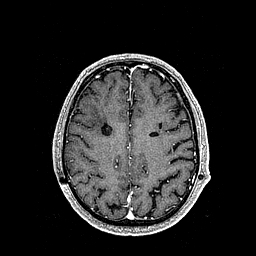
[im 118/160]
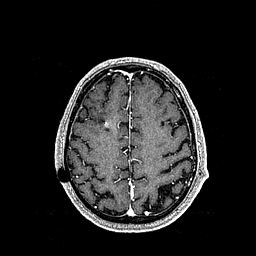
[im 125/160]
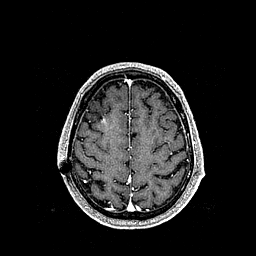
[im 132/160]
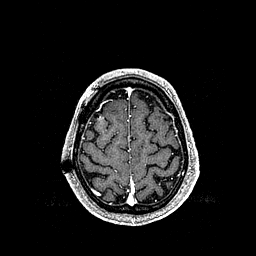
[im 139/160]
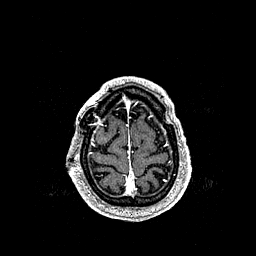
[im 146/160]
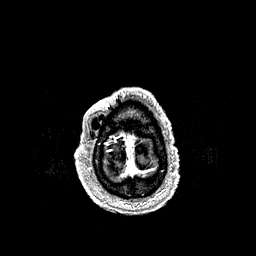
[im 153/160]
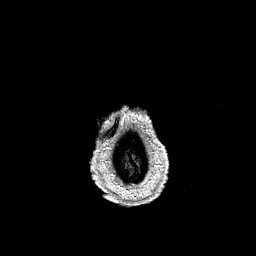
[im 160/160]
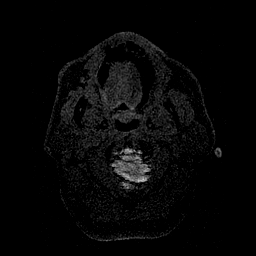

[48 of 48 positions shown; findings below may reference images not displayed]

FINDINGS: Brain: Deep brain stimulator in place on the right. Mild
encephalomalacia/porencephalic a along the course of the electrode
in the deep white matter region, the porencephalic space measuring
11 mm in diameter. Previous right electrode has been removed. One
can appreciate the porencephalic track of the catheter on its way to
the left thalamus. Small separate porencephalic cyst medially
adjacent to that track at the site of the previous abscess measures
4 x 9 mm. No abnormal enhancement.

No hydrocephalus.  No extra-axial collection.

Vascular: Major vessels at the base of the brain show flow.

Skull and upper cervical spine: Negative

Sinuses/Orbits: Clear/normal

Other: None
IMPRESSION: No evidence of residual inflammatory enhancement, edema or mass
effect on the left related to the previous brain abscess. Removal of
left electrode. Porencephaly along the previous electrode track.
Small porencephalic cyst at the site of the previous abscess.

Right electrode remains in place, with an 11 mm porencephalic cyst
adjacent to the electrode in the frontal white matter.

## 2019-08-08 NOTE — Progress Notes (Signed)
Carlos Hunt was seen today in follow up for Essential tremor.  Pt is currently on no medication and relies completely on his DBS device.  Tremor is well controlled.  Pt denies falls.  Pt denies lightheadedness, near syncope.  No hallucinations.  Mood has been good.  Medical records have been reviewed since our last visit.  The patient was in the hospital in November at [facility] for atrial flutter with rapid ventricular response.  Patient was started on Eliquis and metoprolol.  He followed up with outpatient cardiology on November 16.  His metoprolol was increased to 100 mg twice per day because of continued tachycardia.  They are considering an ablation.  He has an appt on friday   ALLERGIES:   Allergies  Allergen Reactions  . Procaine Nausea And Vomiting and Other (See Comments)    NOVOCAINE  . Sulfacetamide Sodium-Sulfur Rash    CURRENT MEDICATIONS:  Outpatient Encounter Medications as of 08/10/2019  Medication Sig  . apixaban (ELIQUIS) 5 MG TABS tablet Take 5 mg by mouth 2 (two) times daily.  . Ascorbic Acid (VITAMIN C) 1000 MG tablet Take 1,000 mg by mouth daily.  . Cholecalciferol (VITAMIN D3) 2000 units TABS Take 2,000 Units by mouth daily.  . Cyanocobalamin (B-12) 3000 MCG CAPS Take 3,000 mcg by mouth daily.  . Digestive Enzymes (PAPAYA AND ENZYMES PO) Take 1 tablet by mouth daily as needed (for heartburn).   . fluticasone (FLONASE) 50 MCG/ACT nasal spray Place 1 spray into both nostrils daily as needed for allergies or rhinitis.  . Methylsulfonylmethane (MSM) 1000 MG CAPS Take 1,000 mg by mouth daily.  . metoprolol succinate (TOPROL-XL) 100 MG 24 hr tablet Take 100 mg by mouth 2 (two) times daily. Take with or immediately following a meal.  . Multiple Vitamins-Minerals (CENTRUM PO) Take 1 tablet by mouth daily.   . Tetrahydroz-Glyc-Hyprom-PEG (VISINE MAXIMUM REDNESS RELIEF OP) Place 1 drop into both eyes daily as needed (for irritation).   Marland Kitchen aspirin 325 MG tablet Take 325  mg by mouth daily.  . hydrochlorothiazide (HYDRODIURIL) 25 MG tablet Take 1 tablet (25 mg total) by mouth daily. (Patient not taking: Reported on 08/10/2019)  . losartan (COZAAR) 50 MG tablet TAKE 1 TABLET BY MOUTH EVERY DAY (Patient not taking: Reported on 08/10/2019)   No facility-administered encounter medications on file as of 08/10/2019.     PAST MEDICAL HISTORY:   Past Medical History:  Diagnosis Date  . Adenomatous polyps 04/02/2017  . Brain abscess 03/11/2017  . Elevated liver enzymes   . Erectile dysfunction   . Essential hypertension, benign 02/14/2013  . Essential tremor   . GERD (gastroesophageal reflux disease)   . Head injury   . Hx of colonic polyps   . Hypertension   . Pain in left knee   . Pain, joint, shoulder   . PONV (postoperative nausea and vomiting)    nausea no vomiting  . Seasonal allergies   . Testicular hypofunction   . Tobacco use disorder   . Tremor   . Unspecified vitamin D deficiency     PAST SURGICAL HISTORY:   Past Surgical History:  Procedure Laterality Date  . COLONOSCOPY  09/09/2011   normal.  Previous polyps.  [name]. Repeat 5 years.  . COLONOSCOPY N/A 07/18/2016   Procedure: COLONOSCOPY;  Surgeon: Manya Silvas, MD;  Location: [facility] ENDOSCOPY;  Service: Endoscopy;  Laterality: N/A;  . HAND SURGERY Left 2011   trauma  . LAPAROSCOPIC INGUINAL HERNIA WITH  UMBILICAL HERNIA Right 22/10/9796   Procedure: LAPAROSCOPIC INGUINAL HERNIA REPAIR, right with umbilical herniorrhaphy;  Surgeon: Florene Glen, MD;  Location: [facility] ORS;  Service: General;  Laterality: Right;  . MINOR PLACEMENT OF FIDUCIAL N/A 11/24/2017   Procedure: MINOR PLACEMENT OF FIDUCIAL;  Surgeon: Erline Levine, MD;  Location: [place];  Service: Neurosurgery;  Laterality: N/A;  Fiducial placement  . PULSE GENERATOR IMPLANT Bilateral 10/23/2016   Procedure: BILATERAL IMPLANTABLE PULSE GENERATOR;  Surgeon: Erline Levine, MD;  Location: [place];  Service: Neurosurgery;  Laterality: Bilateral;   BILATERAL IMPLANTABLE PULSE GENERATOR  . PULSE GENERATOR IMPLANT Left 01/12/2018   Procedure: UNILATERAL PULSE GENERATOR IMPLANT AND EXTENTIONS FOR DEEP BRAIN STIMULATOR;  Surgeon: Erline Levine, MD;  Location: [place];  Service: Neurosurgery;  Laterality: Left;  . SUBTHALAMIC STIMULATOR INSERTION Bilateral 10/07/2016   Procedure: BILATERAL DEEP BRAIN STIMULATOR PLACEMENT;  Surgeon: Erline Levine, MD;  Location: [place];  Service: Neurosurgery;  Laterality: Bilateral;  BILATERAL DEEP BRAIN STIMULATOR PLACEMENT  . SUBTHALAMIC STIMULATOR INSERTION Left 12/04/2017   Procedure: Left Deep Brain Stimulator Placement;  Surgeon: Erline Levine, MD;  Location: [place];  Service: Neurosurgery;  Laterality: Left;  Left deep brain stimulator placement  . SUBTHALAMIC STIMULATOR REMOVAL Left 03/10/2017   Procedure: REMOVAL DEEP BRAIN STIMULATOR AND ASPIRATION OF BRAIN ABSCESS;  Surgeon: Earnie Larsson, MD;  Location: [place];  Service: Neurosurgery;  Laterality: Left;  . UMBILICAL HERNIA REPAIR N/A 07/15/2017   Procedure: HERNIA REPAIR UMBILICAL ADULT;  Surgeon: Florene Glen, MD;  Location: [facility] ORS;  Service: General;  Laterality: N/A;    SOCIAL HISTORY:   Social History   Socioeconomic History  . Marital status: Married    Spouse name: [name]  . Number of children: 4  . Years of education: Not on file  . Highest education level: Not on file  Occupational History  . Occupation: building maintenance    Comment: Chemical engineer  Social Needs  . Financial resource strain: Not on file  . Food insecurity    Worry: Not on file    Inability: Not on file  . Transportation needs    Medical: Not on file    Non-medical: Not on file  Tobacco Use  . Smoking status: Former Smoker    Packs/day: 3.00    Years: 10.00    Pack years: 30.00    Types: Cigarettes    Quit date: 09/23/1970    Years since quitting: 48.9  . Smokeless tobacco: Never Used  . Tobacco comment: quit in the 1970's  Substance and Sexual Activity  .  Alcohol use: Yes    Alcohol/week: 1.0 standard drinks    Types: 1 Shots of liquor per week    Comment: occasional; once every 6 months  . Drug use: No  . Sexual activity: Yes    Birth control/protection: Post-menopausal  Lifestyle  . Physical activity    Days per week: Not on file    Minutes per session: Not on file  . Stress: Not on file  Relationships  . Social Herbalist on phone: Not on file    Gets together: Not on file    Attends religious service: Not on file    Active member of club or organization: Not on file    Attends meetings of clubs or organizations: Not on file    Relationship status: Not on file  . Intimate partner violence    Fear of current or ex partner: Not on file  Emotionally abused: Not on file    Physically abused: Not on file    Forced sexual activity: Not on file  Other Topics Concern  . Not on file  Social History Narrative   Marital status: married x 46 years.      Children: 2 daughters, 2 sons; 6 grandchildren.      Lives: with wife.        Employment: RETIRED in 2018 from [employer] x 13 years; moderately happy.  In 2019, working for [employer] in [place] full time.      Tobacco: quit 42 years ago; smoked x 10 years.       Alcohol: socially; weekends.  Rarely.      Drugs: none      Exercise:  None; work is physical.        Seatbelt: 100%      Sunscreen: rare sun exposure      Guns:  4 gun; unloaded, not stored in locked cabinet.       Smoke alarm and carbon monoxide detector in the home.      Caffeine use: consumes a minimal amount       Advanced Directives/Living Will: patient does NOT have Living Will; Does NOT have HCPOA; getting ready to work on it.     Organ Donor: NO          FAMILY HISTORY:   Family Status  Relation Name Status  . Mother  Deceased at age 71       dementia  . Father  Deceased at age 98       PD, heart disease  . Sister  Alive       unknown  . Brother  Deceased at age 25       Parkinson's disease   . Sister  Alive       unknown  . Brother  Alive       unknown  . Brother  Alive       unknown  . Daughter  Alive       tremor  . Son  Alive       healthy    ROS:  Review of Systems  Constitutional: Negative.   HENT: Negative.   Eyes: Negative.   Respiratory: Negative.   Cardiovascular: Positive for palpitations.  Gastrointestinal: Negative.   Genitourinary: Negative.   Skin: Negative.     PHYSICAL EXAMINATION:    VITALS:   Vitals:   08/10/19 0938  BP: (!) 186/50  Pulse: 60  Resp: 16  SpO2: 98%  Weight: 192 lb 9.6 oz (87.4 kg)  Height: 5' 11"  (1.803 m)    GEN:  The patient appears stated age and is in NAD. HEENT:  Normocephalic, atraumatic.  The mucous membranes are moist. The superficial temporal arteries are without ropiness or tenderness. CV:  Tachy.  regular Lungs:  CTAB Neck/HEME:  There are no carotid bruits bilaterally.  Neurological examination:  Orientation: The patient is alert and oriented x3. Cranial nerves: There is good facial symmetry without facial hypomimia. The speech is fluent and clear. Soft palate rises symmetrically and there is no tongue deviation. Hearing is intact to conversational tone. Sensation: Sensation is intact to light touch throughout Motor: Strength is at least antigravity x4.  Movement examination: Tone: There is normal tone in the UE/LE Abnormal movements: none seen Coordination:  There is no decremation with RAM's. Gait and Station: The patient has no difficulty arising out of a deep-seated chair without the use of the  hands. The patient's stride length is good.    ASSESSMENT/PLAN:  1.  Essential Tremor             -Patient underwent bilateral VIM DBS on 10/07/2016.  He had bilateral IPG placement on 10/23/2016.    Unfortunately, he had a cerebral absess on the left and the L system was removed on 03/10/17.  Patient completed IV antibiotics.  Patient subsequently had left VIM DBS on December 04, 2017 with IPG placement on  Jan 12, 2018.  battery good on both sides today             -Patient is currently off of medications.                    2.  Renal cysts             -Has a stable, benign angiomyolipoma that is being followed by urology.  3.  New onset atrial flutter with rapid ventricular response  -On Eliquis  -On metoprolol.  HR very high today  -Following with cardiology at [facility].  Called Dr. Lyla Son office to inform of HR.  They stated that they were okay to d/c the patient home as long as pt asymptommatic and he is.  Pt has appt on Friday.  Cc:  Wardell Honour, MD

## 2019-08-09 HISTORY — PX: A FLUTTER ABLATION: SHX5348

## 2019-08-10 ENCOUNTER — Other Ambulatory Visit: Payer: Self-pay

## 2019-08-10 ENCOUNTER — Encounter: Payer: Self-pay | Admitting: Neurology

## 2019-08-10 ENCOUNTER — Ambulatory Visit: Payer: Medicare HMO | Admitting: Neurology

## 2019-08-10 VITALS — BP 186/50 | HR 163 | Resp 16 | Ht 71.0 in | Wt 192.6 lb

## 2019-08-10 DIAGNOSIS — G250 Essential tremor: Secondary | ICD-10-CM

## 2019-08-10 DIAGNOSIS — I4892 Unspecified atrial flutter: Secondary | ICD-10-CM

## 2019-08-10 DIAGNOSIS — R000 Tachycardia, unspecified: Secondary | ICD-10-CM | POA: Diagnosis not present

## 2019-08-10 NOTE — Procedures (Signed)
DBS Programming was performed.    Total time spent programming was 10 minutes.  Device was confirmed to be on.  Soft start was confirmed to be on.  Impedences were checked and were within normal limits.  Battery was checked and was determined to be functioning normally and not near the end of life (2.97 V on the left and 2.93 on the right).  Final settings were as follows:  Left brain electrode:     0-1+; Amplitude 2.2 V; PW 90 microseconds; Frequency 150 Hz (therapy impedence at 1489)  Right brain electrode:     2-1+          ; Amplitude   2.7  V ;  Pulse width 90  microseconds;  Frequency   150    Hz. (therapy impedence at 1505)

## 2020-01-12 ENCOUNTER — Ambulatory Visit
Admission: RE | Admit: 2020-01-12 | Discharge: 2020-01-12 | Disposition: A | Discharge status: Home / Self Care | Payer: Medicare HMO | Source: Ambulatory Visit | Attending: Urology | Admitting: Urology

## 2020-01-12 ENCOUNTER — Other Ambulatory Visit: Payer: Self-pay

## 2020-01-12 DIAGNOSIS — N281 Cyst of kidney, acquired: Secondary | ICD-10-CM | POA: Diagnosis present

## 2020-01-12 DIAGNOSIS — D179 Benign lipomatous neoplasm, unspecified: Secondary | ICD-10-CM | POA: Diagnosis not present

## 2020-01-13 ENCOUNTER — Encounter: Payer: Self-pay | Admitting: Urology

## 2020-01-13 ENCOUNTER — Ambulatory Visit: Payer: Medicare HMO | Admitting: Urology

## 2020-01-13 VITALS — BP 152/72 | HR 65 | Ht 71.0 in | Wt 190.0 lb

## 2020-01-13 DIAGNOSIS — N281 Cyst of kidney, acquired: Secondary | ICD-10-CM | POA: Insufficient documentation

## 2020-01-13 DIAGNOSIS — D1771 Benign lipomatous neoplasm of kidney: Secondary | ICD-10-CM

## 2020-01-13 NOTE — Progress Notes (Signed)
01/13/2020 8:22 AM   Dennison Bulla 1945/09/19 [id number]  Referring provider: Wardell Honour, MD [place],  [place] [address]  Chief Complaint  Patient presents with  . Follow-up    Urologic history: 1.  Left angiomyolipoma -8 mm 2017 -12 mm 12/2017 -Asymptomatic  2.  Renal cysts -Bilateral simple renal cysts   HPI: 74 y.o. male presents for annual follow-up of a Villanueva left angiomyolipoma.  Since his visit last year he has been doing well.  Denies flank, abdominal or pelvic pain.  Denies gross hematuria.  Renal ultrasound performed 01/12/2020 shows a stable 12 mm left angiomyolipoma.  Bilateral simple renal cysts are again noted.  There is a 3.8 cm right lower pole cyst with a thin internal septation.   PMH: Past Medical History:  Diagnosis Date  . Adenomatous polyps 04/02/2017  . Brain abscess 03/11/2017  . Elevated liver enzymes   . Erectile dysfunction   . Essential hypertension, benign 02/14/2013  . Essential tremor   . GERD (gastroesophageal reflux disease)   . Head injury   . Hx of colonic polyps   . Hypertension   . Pain in left knee   . Pain, joint, shoulder   . PONV (postoperative nausea and vomiting)    nausea no vomiting  . Seasonal allergies   . Testicular hypofunction   . Tobacco use disorder   . Tremor   . Unspecified vitamin D deficiency     Surgical History: Past Surgical History:  Procedure Laterality Date  . COLONOSCOPY  09/09/2011   normal.  Previous polyps.  [name]. Repeat 5 years.  . COLONOSCOPY N/A 07/18/2016   Procedure: COLONOSCOPY;  Surgeon: Manya Silvas, MD;  Location: [facility] ENDOSCOPY;  Service: Endoscopy;  Laterality: N/A;  . HAND SURGERY Left 2011   trauma  . LAPAROSCOPIC INGUINAL HERNIA WITH UMBILICAL HERNIA Right 98/05/2118   Procedure: LAPAROSCOPIC INGUINAL HERNIA REPAIR, right with umbilical herniorrhaphy;  Surgeon: Florene Glen, MD;  Location: [facility] ORS;  Service: General;  Laterality: Right;  . MINOR  PLACEMENT OF FIDUCIAL N/A 11/24/2017   Procedure: MINOR PLACEMENT OF FIDUCIAL;  Surgeon: Erline Levine, MD;  Location: [place];  Service: Neurosurgery;  Laterality: N/A;  Fiducial placement  . PULSE GENERATOR IMPLANT Bilateral 10/23/2016   Procedure: BILATERAL IMPLANTABLE PULSE GENERATOR;  Surgeon: Erline Levine, MD;  Location: [place];  Service: Neurosurgery;  Laterality: Bilateral;  BILATERAL IMPLANTABLE PULSE GENERATOR  . PULSE GENERATOR IMPLANT Left 01/12/2018   Procedure: UNILATERAL PULSE GENERATOR IMPLANT AND EXTENTIONS FOR DEEP BRAIN STIMULATOR;  Surgeon: Erline Levine, MD;  Location: [place];  Service: Neurosurgery;  Laterality: Left;  . SUBTHALAMIC STIMULATOR INSERTION Bilateral 10/07/2016   Procedure: BILATERAL DEEP BRAIN STIMULATOR PLACEMENT;  Surgeon: Erline Levine, MD;  Location: [place];  Service: Neurosurgery;  Laterality: Bilateral;  BILATERAL DEEP BRAIN STIMULATOR PLACEMENT  . SUBTHALAMIC STIMULATOR INSERTION Left 12/04/2017   Procedure: Left Deep Brain Stimulator Placement;  Surgeon: Erline Levine, MD;  Location: [place];  Service: Neurosurgery;  Laterality: Left;  Left deep brain stimulator placement  . SUBTHALAMIC STIMULATOR REMOVAL Left 03/10/2017   Procedure: REMOVAL DEEP BRAIN STIMULATOR AND ASPIRATION OF BRAIN ABSCESS;  Surgeon: Earnie Larsson, MD;  Location: [place];  Service: Neurosurgery;  Laterality: Left;  . UMBILICAL HERNIA REPAIR N/A 07/15/2017   Procedure: HERNIA REPAIR UMBILICAL ADULT;  Surgeon: Florene Glen, MD;  Location: [facility] ORS;  Service: General;  Laterality: N/A;    Home Medications:  Allergies as of 01/13/2020  Reactions   Procaine Nausea And Vomiting, Other (See Comments)   NOVOCAINE   Sulfacetamide Sodium-sulfur Rash      Medication List       Accurate as of Jan 13, 2020  8:22 AM. If you have any questions, ask your nurse or doctor.        apixaban 5 MG Tabs tablet Commonly known as: ELIQUIS Take 5 mg by mouth 2 (two) times daily.   aspirin 325 MG  tablet Take 325 mg by mouth daily.   B-12 3000 MCG Caps Take 3,000 mcg by mouth daily.   CENTRUM PO Take 1 tablet by mouth daily.   fluticasone 50 MCG/ACT nasal spray Commonly known as: FLONASE Place 1 spray into both nostrils daily as needed for allergies or rhinitis.   hydrochlorothiazide 25 MG tablet Commonly known as: HYDRODIURIL Take 1 tablet (25 mg total) by mouth daily.   losartan 50 MG tablet Commonly known as: COZAAR TAKE 1 TABLET BY MOUTH EVERY DAY   metoprolol succinate 100 MG 24 hr tablet Commonly known as: TOPROL-XL Take 100 mg by mouth 2 (two) times daily. Take with or immediately following a meal.   MSM 1000 MG Caps Take 1,000 mg by mouth daily.   PAPAYA AND ENZYMES PO Take 1 tablet by mouth daily as needed (for heartburn).   VISINE MAXIMUM REDNESS RELIEF OP Place 1 drop into both eyes daily as needed (for irritation).   vitamin C 1000 MG tablet Take 1,000 mg by mouth daily.   Vitamin D3 50 MCG (2000 UT) Tabs Take 2,000 Units by mouth daily.       Allergies:  Allergies  Allergen Reactions  . Procaine Nausea And Vomiting and Other (See Comments)    NOVOCAINE  . Sulfacetamide Sodium-Sulfur Rash    Family History: Family History  Problem Relation Age of Onset  . Dementia Mother   . Parkinson's disease Father   . Cancer Father 69       prostate  . Heart disease Father 55       CAD/CABG  . Hypertension Father   . Hyperlipidemia Father   . Parkinsonism Father   . Parkinson's disease Brother   . Diabetes Sister     Social History:  reports that he quit smoking about 49 years ago. His smoking use included cigarettes. He has a 30.00 pack-year smoking history. He has never used smokeless tobacco. He reports current alcohol use of about 1.0 standard drinks of alcohol per week. He reports that he does not use drugs.   Physical Exam: BP (!) 152/72   Pulse 65   Ht 5' 11"  (1.803 m)   Wt 190 lb (86.2 kg)   BMI 26.50 kg/m   Constitutional:   Alert and oriented, No acute distress. HEENT: [place] AT, moist mucus membranes.  Trachea midline, no masses. Cardiovascular: No clubbing, cyanosis, or edema. Respiratory: Normal respiratory effort, no increased work of breathing.   Pertinent Imaging: Images were personally reviewed  Results for orders placed during the hospital encounter of 01/12/20  Ultrasound renal complete   Narrative CLINICAL DATA:  History of angiomyolipoma.  Follow-up.  Renal cysts.  EXAM: RENAL / URINARY TRACT ULTRASOUND COMPLETE  COMPARISON:  Ultrasound on 01/12/2019, MRI on 09/05/2016  FINDINGS: Right Kidney:  Renal measurements: 12.6 x 6.0 x 5.6 centimeters = volume: 220 mL. A simple cyst in the midpole region is 1.5 x 1.5 x 1.6 centimeters. Simple cyst in the midpole region is 1.4 x 1.3 x 1.4 centimeters. A  cyst with thin internal septation in the LOWER pole the RIGHT kidney is 3.8 x 3.3 x 3.7 centimeters. There is no hydronephrosis. No solid renal mass.  Left Kidney:  Renal measurements: 11.8 x 6.0 x 4.4 centimeters = volume: 161 mL. Simple cyst in the UPPER pole region is 1.0 x 1.0 x 1.1 centimeters. A homogeneous hyperechoic circumscribed lesion in the midpole region is 1.2 x 1.0 x 1.2 centimeters and shows 2 years of stability.  Bladder:  Appears normal for degree of bladder distention.  Other:  None.  IMPRESSION: 1. Bilateral simple cysts. 2. Stable appearance of angiomyolipoma in the midpole region of the LEFT kidney.   Electronically Signed   By: Nolon Nations M.D.   On: 01/12/2020 10:19     Assessment & Plan:    - Left renal angiomyolipoma Stable.  Follow-up renal ultrasound 1 year and if stable will then image in 2 years  - Bilateral renal cysts Stable   Abbie Sons, MD  [facility] [address], [place], [place] [address] [phone]

## 2020-02-07 ENCOUNTER — Encounter: Payer: Self-pay | Admitting: Neurology

## 2020-03-27 NOTE — Progress Notes (Signed)
Assessment/Plan:    1.  Essential Tremor  -Status post bilateral VIM DBS in January, 2018.  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  Patient subsequently had a left VIM DBS on December 04, 2017 with IPG placement on Jan 12, 2018.  2.  Atrial flutter with rapid ventricular response  -  Off metoprolol after successful flutter ablation, as well as bradycardia due to metoprolol.  Off of Eliquis and on aspirin alone.  Following with cardiology at [facility].  F/u 1 year.  Encouraged pt to check battery periodically but don't suspect there will be any issue there  Subjective:   Carlos Hunt was seen today in follow up for essential tremor.  My previous records were reviewed prior to todays visit, as well as those from cardiology and primary care.  Patient has had successful atrial flutter ablation since last visit.  He is off of metoprolol due to bradycardia.  Pt denies falls.  Pt denies lightheadedness, near syncope.  No hallucinations.  Mood has been good.  Current prescribed movement disorder medications: n/a   ALLERGIES:   Allergies  Allergen Reactions  . Procaine Nausea And Vomiting and Other (See Comments)    NOVOCAINE  . Sulfacetamide Sodium-Sulfur Rash    CURRENT MEDICATIONS:  Outpatient Encounter Medications as of 04/10/2020  Medication Sig  . Ascorbic Acid (VITAMIN C) 1000 MG tablet Take 1,000 mg by mouth daily.  Marland Kitchen aspirin 325 MG tablet Take 325 mg by mouth daily.  . Cholecalciferol (VITAMIN D3) 2000 units TABS Take 2,000 Units by mouth daily.  . Cyanocobalamin (B-12) 3000 MCG CAPS Take 3,000 mcg by mouth daily.  . Digestive Enzymes (PAPAYA AND ENZYMES PO) Take 1 tablet by mouth daily as needed (for heartburn).   . fluticasone (FLONASE) 50 MCG/ACT nasal spray Place 1 spray into both nostrils daily as needed for allergies or rhinitis.  . hydrALAZINE (APRESOLINE) 10 MG tablet Take 10 mg by mouth 2 (two) times daily.  .  hydrochlorothiazide (HYDRODIURIL) 25 MG tablet Take 1 tablet (25 mg total) by mouth daily.  . Methylsulfonylmethane (MSM) 1000 MG CAPS Take 1,000 mg by mouth daily.  . metoprolol succinate (TOPROL-XL) 100 MG 24 hr tablet Take 100 mg by mouth 2 (two) times daily. Take with or immediately following a meal.  . Multiple Vitamins-Minerals (CENTRUM PO) Take 1 tablet by mouth daily.   . Tetrahydroz-Glyc-Hyprom-PEG (VISINE MAXIMUM REDNESS RELIEF OP) Place 1 drop into both eyes daily as needed (for irritation).   . valsartan (DIOVAN) 320 MG tablet Take 320 mg by mouth daily.  . [DISCONTINUED] apixaban (ELIQUIS) 5 MG TABS tablet Take 5 mg by mouth 2 (two) times daily. (Patient not taking: Reported on 04/10/2020)  . [DISCONTINUED] losartan (COZAAR) 50 MG tablet TAKE 1 TABLET BY MOUTH EVERY DAY (Patient not taking: Reported on 04/10/2020)   No facility-administered encounter medications on file as of 04/10/2020.     Objective:    PHYSICAL EXAMINATION:    VITALS:   Vitals:   04/10/20 1125  BP: (!) 156/71  Pulse: (!) 47  SpO2: 98%  Weight: 195 lb (88.5 kg)  Height: 5' 8"  (1.727 m)    GEN:  The patient appears stated age and is in NAD. HEENT:  Normocephalic, atraumatic.  The mucous membranes are moist. The superficial temporal arteries are without ropiness or tenderness. CV: Bradycardic.  Regular Lungs:  CTAB Neck/HEME:  There are no carotid bruits bilaterally.  Neurological examination:  Orientation: The patient is alert and oriented x3. Cranial nerves: There is good facial symmetry. The speech is fluent and clear. Soft palate rises symmetrically and there is no tongue deviation. Hearing is intact to conversational tone. Sensation: Sensation is intact to light touch throughout Motor: Strength is at least antigravity x4.  Movement examination: Tone: There is normal tone in the UE/LE Abnormal movements: There is no rest tremor.  There is no intention tremor. Coordination:  There is no  decremation with RAM's Gait and Station: The patient has no difficulty arising out of a deep-seated chair without the use of the hands. The patient's stride length is good I have reviewed and interpreted the following labs independently Patient had lab work through primary care on March 26, 2020.  Sodium was 137, potassium 4.1, chloride 99, CO2 24, BUN 17, creatinine 1.1, glucose 93, white blood cells 10.3, hemoglobin 15.4, hematocrit 44.4 and platelets 290.    Total time spent on today's visit was 20 minutes, including both face-to-face time and nonface-to-face time.  Time included that spent on review of records (prior notes available to me/labs/imaging if pertinent), discussing treatment and goals, answering patient's questions and coordinating care.  This did not include DBS time.  Cc:  Wardell Honour, MD

## 2020-04-03 ENCOUNTER — Encounter: Payer: Medicare HMO | Admitting: Neurology

## 2020-04-09 ENCOUNTER — Encounter: Payer: Medicare HMO | Admitting: Neurology

## 2020-04-10 ENCOUNTER — Other Ambulatory Visit: Payer: Self-pay

## 2020-04-10 ENCOUNTER — Encounter: Payer: Self-pay | Admitting: Neurology

## 2020-04-10 ENCOUNTER — Ambulatory Visit (INDEPENDENT_AMBULATORY_CARE_PROVIDER_SITE_OTHER): Payer: Medicare HMO | Admitting: Neurology

## 2020-04-10 VITALS — BP 156/71 | HR 47 | Ht 68.0 in | Wt 195.0 lb

## 2020-04-10 DIAGNOSIS — I4892 Unspecified atrial flutter: Secondary | ICD-10-CM | POA: Diagnosis not present

## 2020-04-10 DIAGNOSIS — G250 Essential tremor: Secondary | ICD-10-CM | POA: Diagnosis not present

## 2020-04-10 NOTE — Patient Instructions (Signed)
You are looking great!  The physicians and staff at [facility] are committed to providing excellent care. You may receive a survey requesting feedback about your experience at our office. We strive to receive "very good" responses to the survey questions. If you feel that your experience would prevent you from giving the office a "very good " response, please contact our office to try to remedy the situation. We may be reached at [phone]. Thank you for taking the time out of your busy day to complete the survey.

## 2020-04-10 NOTE — Procedures (Signed)
DBS Programming was performed.    Manufacturer of DBS device: [employer]  Total time spent programming was 5 minutes.  Device was confirmed to be on.  Soft start was confirmed to be on.  Impedences were checked and were within normal limits.  Battery was checked and was determined to be functioning normally and not near the end of life.  Final settings were as follows:   Active Contact Amplitude (mA) PW (ms) Frequency (hz) Side Effects Battery  Left Brain        04/10/20 0-1+ 2.2 90 150  2.96                          Right Brain        04/10/20 2-1+ 2.7 90 150  2.91

## 2020-12-25 ENCOUNTER — Ambulatory Visit
Admission: RE | Admit: 2020-12-25 | Discharge: 2020-12-25 | Disposition: A | Discharge status: Home / Self Care | Payer: Medicare HMO | Source: Ambulatory Visit | Attending: Urology | Admitting: Urology

## 2020-12-25 ENCOUNTER — Other Ambulatory Visit: Payer: Self-pay

## 2020-12-25 DIAGNOSIS — D1771 Benign lipomatous neoplasm of kidney: Secondary | ICD-10-CM

## 2020-12-25 DIAGNOSIS — N281 Cyst of kidney, acquired: Secondary | ICD-10-CM | POA: Insufficient documentation

## 2021-01-14 ENCOUNTER — Ambulatory Visit: Payer: Self-pay | Admitting: Urology

## 2021-01-25 ENCOUNTER — Other Ambulatory Visit: Payer: Self-pay

## 2021-01-25 ENCOUNTER — Encounter: Payer: Self-pay | Admitting: Urology

## 2021-01-25 ENCOUNTER — Ambulatory Visit: Payer: Medicare HMO | Admitting: Urology

## 2021-01-25 VITALS — BP 144/92 | HR 73 | Ht 71.0 in | Wt 200.0 lb

## 2021-01-25 DIAGNOSIS — N281 Cyst of kidney, acquired: Secondary | ICD-10-CM | POA: Diagnosis not present

## 2021-01-25 DIAGNOSIS — D1771 Benign lipomatous neoplasm of kidney: Secondary | ICD-10-CM

## 2021-01-25 NOTE — Progress Notes (Signed)
01/25/2021 1:19 PM   Carlos Hunt 1946/08/19 [id number]  Referring provider: Wardell Honour, MD [place],  [place] [id number]  Chief Complaint  Patient presents with  . Other    Urologic history: 1.  Left angiomyolipoma -8 mm 2017 -12 mm 12/2017 -Asymptomatic  2.  Renal cysts -Bilateral simple renal cysts   HPI: 75 y.o. male presents for annual follow-up.   No problems since last years visit  Denies dysuria, gross hematuria  Denies flank, abdominal or pelvic pain  Renal ultrasound performed 12/25/2020 shows stable renal cysts and left angiomyolipoma   PMH: Past Medical History:  Diagnosis Date  . Adenomatous polyps 04/02/2017  . Brain abscess 03/11/2017  . Elevated liver enzymes   . Erectile dysfunction   . Essential hypertension, benign 02/14/2013  . Essential tremor   . GERD (gastroesophageal reflux disease)   . Head injury   . Hx of colonic polyps   . Hypertension   . Pain in left knee   . Pain, joint, shoulder   . PONV (postoperative nausea and vomiting)    nausea no vomiting  . Seasonal allergies   . Testicular hypofunction   . Tobacco use disorder   . Tremor   . Unspecified vitamin D deficiency     Surgical History: Past Surgical History:  Procedure Laterality Date  . COLONOSCOPY  09/09/2011   normal.  Previous polyps.  [name]. Repeat 5 years.  . COLONOSCOPY N/A 07/18/2016   Procedure: COLONOSCOPY;  Surgeon: Manya Silvas, MD;  Location: [facility] ENDOSCOPY;  Service: Endoscopy;  Laterality: N/A;  . HAND SURGERY Left 2011   trauma  . LAPAROSCOPIC INGUINAL HERNIA WITH UMBILICAL HERNIA Right 01/0/9323   Procedure: LAPAROSCOPIC INGUINAL HERNIA REPAIR, right with umbilical herniorrhaphy;  Surgeon: Florene Glen, MD;  Location: [facility] ORS;  Service: General;  Laterality: Right;  . MINOR PLACEMENT OF FIDUCIAL N/A 11/24/2017   Procedure: MINOR PLACEMENT OF FIDUCIAL;  Surgeon: Erline Levine, MD;  Location: [place];  Service: Neurosurgery;   Laterality: N/A;  Fiducial placement  . PULSE GENERATOR IMPLANT Bilateral 10/23/2016   Procedure: BILATERAL IMPLANTABLE PULSE GENERATOR;  Surgeon: Erline Levine, MD;  Location: [place];  Service: Neurosurgery;  Laterality: Bilateral;  BILATERAL IMPLANTABLE PULSE GENERATOR  . PULSE GENERATOR IMPLANT Left 01/12/2018   Procedure: UNILATERAL PULSE GENERATOR IMPLANT AND EXTENTIONS FOR DEEP BRAIN STIMULATOR;  Surgeon: Erline Levine, MD;  Location: [place];  Service: Neurosurgery;  Laterality: Left;  . SUBTHALAMIC STIMULATOR INSERTION Bilateral 10/07/2016   Procedure: BILATERAL DEEP BRAIN STIMULATOR PLACEMENT;  Surgeon: Erline Levine, MD;  Location: [place];  Service: Neurosurgery;  Laterality: Bilateral;  BILATERAL DEEP BRAIN STIMULATOR PLACEMENT  . SUBTHALAMIC STIMULATOR INSERTION Left 12/04/2017   Procedure: Left Deep Brain Stimulator Placement;  Surgeon: Erline Levine, MD;  Location: [place];  Service: Neurosurgery;  Laterality: Left;  Left deep brain stimulator placement  . SUBTHALAMIC STIMULATOR REMOVAL Left 03/10/2017   Procedure: REMOVAL DEEP BRAIN STIMULATOR AND ASPIRATION OF BRAIN ABSCESS;  Surgeon: Earnie Larsson, MD;  Location: [place];  Service: Neurosurgery;  Laterality: Left;  . UMBILICAL HERNIA REPAIR N/A 07/15/2017   Procedure: HERNIA REPAIR UMBILICAL ADULT;  Surgeon: Florene Glen, MD;  Location: [facility] ORS;  Service: General;  Laterality: N/A;    Home Medications:  Allergies as of 01/25/2021      Reactions   Amlodipine    Other reaction(s): Headache   Procaine Nausea And Vomiting, Other (See Comments)   NOVOCAINE   Sulfacetamide Sodium-sulfur Rash  Medication List       Accurate as of Jan 25, 2021  1:19 PM. If you have any questions, ask your nurse or doctor.        aspirin 325 MG tablet Take 325 mg by mouth daily.   B-12 3000 MCG Caps Take 3,000 mcg by mouth daily.   CENTRUM PO Take 1 tablet by mouth daily.   fluticasone 50 MCG/ACT nasal spray Commonly known as: FLONASE Place 1  spray into both nostrils daily as needed for allergies or rhinitis.   hydrALAZINE 10 MG tablet Commonly known as: APRESOLINE Take 10 mg by mouth 2 (two) times daily.   hydrochlorothiazide 25 MG tablet Commonly known as: HYDRODIURIL Take 1 tablet (25 mg total) by mouth daily.   metoprolol succinate 100 MG 24 hr tablet Commonly known as: TOPROL-XL Take 100 mg by mouth 2 (two) times daily. Take with or immediately following a meal.   MSM 1000 MG Caps Take 1,000 mg by mouth daily.   PAPAYA AND ENZYMES PO Take 1 tablet by mouth daily as needed (for heartburn).   valsartan 320 MG tablet Commonly known as: DIOVAN Take 320 mg by mouth daily.   VISINE MAXIMUM REDNESS RELIEF OP Place 1 drop into both eyes daily as needed (for irritation).   vitamin C 1000 MG tablet Take 1,000 mg by mouth daily.   Vitamin D3 50 MCG (2000 UT) Tabs Take 2,000 Units by mouth daily.       Allergies:  Allergies  Allergen Reactions  . Amlodipine     Other reaction(s): Headache  . Procaine Nausea And Vomiting and Other (See Comments)    NOVOCAINE  . Sulfacetamide Sodium-Sulfur Rash    Family History: Family History  Problem Relation Age of Onset  . Dementia Mother   . Parkinson's disease Father   . Cancer Father 49       prostate  . Heart disease Father 14       CAD/CABG  . Hypertension Father   . Hyperlipidemia Father   . Parkinsonism Father   . Parkinson's disease Brother   . Diabetes Sister     Social History:  reports that he quit smoking about 50 years ago. His smoking use included cigarettes. He has a 30.00 pack-year smoking history. He has never used smokeless tobacco. He reports current alcohol use of about 1.0 standard drink of alcohol per week. He reports that he does not use drugs.   Physical Exam: BP (!) 144/92   Pulse 73   Ht 5' 11"  (1.803 m)   Wt 200 lb (90.7 kg)   BMI 27.89 kg/m   Constitutional:  Alert and oriented, No acute distress. HEENT: [place] AT, moist mucus  membranes.  Trachea midline, no masses. Cardiovascular: No clubbing, cyanosis, or edema. Respiratory: Normal respiratory effort, no increased work of breathing. Skin: No rashes, bruises or suspicious lesions. Neurologic: Grossly intact, no focal deficits, moving all 4 extremities. Psychiatric: Normal mood and affect.   Pertinent Imaging: Ultrasound images were personally reviewed and interpreted  Ultrasound renal complete  Narrative CLINICAL DATA:  Follow-up angiomyolipoma of the left kidney.  EXAM: RENAL / URINARY TRACT ULTRASOUND COMPLETE  COMPARISON:  Multiple prior ultrasound examinations.  FINDINGS: Right Kidney:  Renal measurements: 12.5 x 6.3 x 4.8 cm = volume: 197.9 mL. Normal renal cortical thickness and echogenicity without hydronephrosis. Stable 4.2 x 3.1 x 3.5 cm cyst associated with the lower pole region of the kidney. A few stable thin septations are noted.  Stable  1.7 x 1.1 x 1.4 cm midpole cyst.  Left Kidney:  Renal measurements: 10.3 x 6.1 x 5.6 cm = volume: 182.8 mL. Normal renal cortical thickness and echogenicity. No hydronephrosis. There is a stable 1.2 x 1.0 x 0.8 cm echogenic lesion in the midpole region. This is consistent with a benign angiomyolipoma.  Bladder:  Appears normal for degree of bladder distention.  Other:  None.  IMPRESSION: Stable renal cysts and left renal angiomyolipoma.   Electronically Signed By: Marijo Sanes M.D. On: 12/26/2020 10:15  No results found for this or any previous visit.  No results found for this or any previous visit.  No results found for this or any previous visit.   Assessment & Plan:    1.  Left renal angiomyolipoma  Increased size noted between 2017-2019  Current size stable and would continue annual monitoring and if size stable after 5 years consider discontinuing imaging  2.  Renal cysts  Stable  Schedule follow-up visit 1 year with renal ultrasound prior    Abbie Sons,  MD  [facility] [address] [place], [place] [address] [phone]

## 2021-04-09 NOTE — Progress Notes (Signed)
Assessment/Plan:    1.  Essential Tremor  -Status post bilateral VIM DBS in January, 2018.  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  Patient subsequently had a left VIM DBS on December 04, 2017 with IPG placement on Jan 12, 2018.  2.  Atrial flutter with rapid ventricular response  -  Off metoprolol after successful flutter ablation, as well as bradycardia due to metoprolol.  Off of Eliquis and on aspirin alone.  Following with cardiology at [facility].  Follow-up in 8 months.  Asked the patient to check his DBS system monthly to make sure that the right IPG is not depleting.  Subjective:   Carlos Hunt was seen today in follow up for essential tremor.  My previous records were reviewed prior to todays visit, as well as those from cardiology and primary care.  Patient last seen a year ago.  Happy with dbs.  Saw primary care September 26, 2020.  Notes are reviewed.  Current prescribed movement disorder medications: n/a   ALLERGIES:   Allergies  Allergen Reactions   Amlodipine     Other reaction(s): Headache   Procaine Nausea And Vomiting and Other (See Comments)    NOVOCAINE   Sulfacetamide Sodium-Sulfur Rash    CURRENT MEDICATIONS:  Outpatient Encounter Medications as of 04/10/2021  Medication Sig   Ascorbic Acid (VITAMIN C) 1000 MG tablet Take 1,000 mg by mouth daily.   aspirin 325 MG tablet Take 325 mg by mouth daily.   Cholecalciferol (VITAMIN D3) 2000 units TABS Take 2,000 Units by mouth daily.   Cyanocobalamin (B-12) 3000 MCG CAPS Take 3,000 mcg by mouth daily.   Digestive Enzymes (PAPAYA AND ENZYMES PO) Take 1 tablet by mouth daily as needed (for heartburn).    fluticasone (FLONASE) 50 MCG/ACT nasal spray Place 1 spray into both nostrils daily as needed for allergies or rhinitis.   hydrALAZINE (APRESOLINE) 10 MG tablet Take 10 mg by mouth 2 (two) times daily.   hydrochlorothiazide (HYDRODIURIL) 25 MG tablet Take 1 tablet (25 mg  total) by mouth daily.   Methylsulfonylmethane (MSM) 1000 MG CAPS Take 1,000 mg by mouth daily.   metoprolol succinate (TOPROL-XL) 100 MG 24 hr tablet Take 100 mg by mouth 2 (two) times daily. Take with or immediately following a meal.   Multiple Vitamins-Minerals (CENTRUM PO) Take 1 tablet by mouth daily.    Tetrahydroz-Glyc-Hyprom-PEG (VISINE MAXIMUM REDNESS RELIEF OP) Place 1 drop into both eyes daily as needed (for irritation).    valsartan (DIOVAN) 320 MG tablet Take 320 mg by mouth daily.   metroNIDAZOLE (METROGEL) 1 % gel Apply topically daily.   No facility-administered encounter medications on file as of 04/10/2021.     Objective:    PHYSICAL EXAMINATION:    VITALS:   Vitals:   04/10/21 1110  BP: (!) 157/58  Pulse: 65  SpO2: 98%  Weight: 199 lb 9.6 oz (90.5 kg)  Height: 5' 10"  (1.778 m)     GEN:  The patient appears stated age and is in NAD. HEENT:  Normocephalic, atraumatic.  The mucous membranes are moist. The superficial temporal arteries are without ropiness or tenderness. CV: Bradycardic.  Regular Lungs:  CTAB Neck/HEME:  There are no carotid bruits bilaterally.  Neurological examination:  Orientation: The patient is alert and oriented x3. Cranial nerves: There is good facial symmetry. The speech is fluent and clear. Soft palate rises symmetrically and there is no tongue deviation.  Hearing is intact to conversational tone. Sensation: Sensation is intact to light touch throughout Motor: Strength is at least antigravity x4.  Movement examination: Tone: There is normal tone in the UE/LE Abnormal movements: There is no rest tremor.  There is no intention tremor. Coordination:  There is no decremation with RAM's Gait and Station: The patient has no difficulty arising out of a deep-seated chair without the use of the hands. The patient's stride length is good I have reviewed and interpreted the following labs independently Patient had lab work through primary care  on March 26, 2020.  Sodium was 137, potassium 4.1, chloride 99, CO2 24, BUN 17, creatinine 1.1, glucose 93, white blood cells 10.3, hemoglobin 15.4, hematocrit 44.4 and platelets 290.    Total time spent on today's visit was 10 minutes, including both face-to-face time and nonface-to-face time.  Time included that spent on review of records (prior notes available to me/labs/imaging if pertinent), discussing treatment and goals, answering patient's questions and coordinating care.  This did not include DBS time.  Cc:  Wardell Honour, MD

## 2021-04-10 ENCOUNTER — Other Ambulatory Visit: Payer: Self-pay

## 2021-04-10 ENCOUNTER — Ambulatory Visit: Payer: Medicare HMO | Admitting: Neurology

## 2021-04-10 ENCOUNTER — Encounter: Payer: Self-pay | Admitting: Neurology

## 2021-04-10 DIAGNOSIS — G250 Essential tremor: Secondary | ICD-10-CM | POA: Diagnosis not present

## 2021-04-10 NOTE — Procedures (Signed)
DBS Programming was performed.    Manufacturer of DBS device: [employer]  Total time spent programming was 7 minutes.  Device was confirmed to be on.  Soft start was confirmed to be on.  Impedences were checked and were within normal limits.  Battery was checked and was determined to be functioning normally and not near the end of life.  Final settings were as follows:   Active Contact Amplitude (mA) PW (ms) Frequency (hz) Side Effects Battery  Left Brain        04/10/20 0-1+ 2.2 90 150  2.96  04/10/21 0-1+ 2.2 90 150  2.93                  Right Brain        04/10/20 2-1+ 2.7 90 150  2.91  04/10/21 2-1+ 2.7 90 150  2.80

## 2021-06-12 ENCOUNTER — Telehealth: Payer: Self-pay | Admitting: Neurology

## 2021-06-12 NOTE — Telephone Encounter (Signed)
Pt called in stating he has DBS and one of his batteries is not showing up on his remote. He says it isn't an emergency because he isn't shaking yet.

## 2021-06-12 NOTE — Telephone Encounter (Signed)
Called patient and delivered information to cal Carlos Hunt at [employer]. Patient said he hasn't had any issues he has been on vacation and even played golf with no shaking. He said that his remote is stating that one side is not charged

## 2021-06-12 NOTE — Telephone Encounter (Signed)
Spoke to Dr. Carles Collet about this . Currently working on it

## 2021-06-19 ENCOUNTER — Telehealth: Payer: Self-pay | Admitting: Neurology

## 2021-06-19 NOTE — Telephone Encounter (Signed)
I got patient sch for 06-27-21 at 8:45 60 Mins DBS

## 2021-06-19 NOTE — Telephone Encounter (Signed)
Patient called here last week and having trouble interrogating the right side.  I asked him to call the [employer] rep as I thought she would be able to help him troubleshoot.  She thought that maybe he was EOS, but was not able to help him.  Discussed with her that it would be virtually impossible for him to be EOS as his battery was at 2.93 on the left in August and 2.8 on the right in August.  In addition, the patient is noting service is still on clinically.  Hinton Dyer, can you schedule him next Thursday in the urgent spots for DBS appointment

## 2021-06-25 NOTE — Progress Notes (Signed)
Assessment/Plan:    1.  Essential Tremor  -Status post bilateral VIM DBS in January, 2018.  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  Patient subsequently had a left VIM DBS on December 04, 2017 with IPG placement on Jan 12, 2018.  -pt had trouble interrogating R DBS system.  We reviewed it today.  Both sides were able to be interrogated with patient programmer and Oncologist.  Pt showed me how to use his programmer and was able to successfully interrogate device x 2.  2.  Atrial flutter with rapid ventricular response  -  Off metoprolol after successful flutter ablation, as well as bradycardia due to metoprolol.  Off of Eliquis and on aspirin alone.  Following with cardiology at [facility] 3. Will move f/u appt to feb, just to make sure battery on R still looks ok.  Subjective:   Carlos Hunt was seen today in follow up for essential tremor.  My previous records were reviewed prior to todays visit, as well as those from cardiology and primary care.  Patient last seen in August, but he called here October 12 as he was having trouble interrogating the right side (battery life looked good in August).  He called the DBS rep, but she thought he potentially was EOS and she contacted me.  We decided to work him in today.  Patient states that the DBS is still working.  Current prescribed movement disorder medications: n/a   ALLERGIES:   Allergies  Allergen Reactions   Amlodipine     Other reaction(s): Headache   Procaine Nausea And Vomiting and Other (See Comments)    NOVOCAINE   Sulfacetamide Sodium-Sulfur Rash    CURRENT MEDICATIONS:  Outpatient Encounter Medications as of 06/27/2021  Medication Sig   atorvastatin (LIPITOR) 40 MG tablet Take 40 mg by mouth daily.   Ascorbic Acid (VITAMIN C) 1000 MG tablet Take 1,000 mg by mouth daily.   aspirin 325 MG tablet Take 325 mg by mouth daily.   Cholecalciferol (VITAMIN D3) 2000 units TABS  Take 2,000 Units by mouth daily.   Cyanocobalamin (B-12) 3000 MCG CAPS Take 3,000 mcg by mouth daily.   Digestive Enzymes (PAPAYA AND ENZYMES PO) Take 1 tablet by mouth daily as needed (for heartburn).    fluticasone (FLONASE) 50 MCG/ACT nasal spray Place 1 spray into both nostrils daily as needed for allergies or rhinitis.   hydrALAZINE (APRESOLINE) 10 MG tablet Take 10 mg by mouth 2 (two) times daily.   hydrochlorothiazide (HYDRODIURIL) 25 MG tablet Take 1 tablet (25 mg total) by mouth daily.   Methylsulfonylmethane (MSM) 1000 MG CAPS Take 1,000 mg by mouth daily.   metoprolol succinate (TOPROL-XL) 100 MG 24 hr tablet Take 100 mg by mouth 2 (two) times daily. Take with or immediately following a meal.   metroNIDAZOLE (METROGEL) 1 % gel Apply topically daily.   Multiple Vitamins-Minerals (CENTRUM PO) Take 1 tablet by mouth daily.    Tetrahydroz-Glyc-Hyprom-PEG (VISINE MAXIMUM REDNESS RELIEF OP) Place 1 drop into both eyes daily as needed (for irritation).    valsartan (DIOVAN) 320 MG tablet Take 320 mg by mouth daily.   No facility-administered encounter medications on file as of 06/27/2021.     Objective:    PHYSICAL EXAMINATION:    VITALS:   Vitals:   06/27/21 0840  BP: 136/62  Pulse: (!) 52  Weight: 197 lb 9.6 oz (89.6 kg)  Height: 5' 9"  (1.753  m)      GEN:  The patient appears stated age and is in NAD. HEENT:  Normocephalic, atraumatic.  The mucous membranes are moist. The superficial temporal arteries are without ropiness or tenderness.   Neurological examination:  Orientation: The patient is alert and oriented x3. Cranial nerves: There is good facial symmetry. The speech is fluent and clear. Soft palate rises symmetrically and there is no tongue deviation. Hearing is intact to conversational tone. Sensation: Sensation is intact to light touch throughout Motor: Strength is at least antigravity x4.  Movement examination: Tone: There is normal tone in the  UE/LE Abnormal movements: There is no rest tremor.  There is no intention tremor. Coordination:  There is no decremation with RAM's Gait and Station: The patient has no difficulty arising out of a deep-seated chair without the use of the hands. The patient's stride length is good I have reviewed and interpreted the following labs independently Patient had lab work through primary care on March 26, 2020.  Sodium was 137, potassium 4.1, chloride 99, CO2 24, BUN 17, creatinine 1.1, glucose 93, white blood cells 10.3, hemoglobin 15.4, hematocrit 44.4 and platelets 290.   Cc:  Wardell Honour, MD

## 2021-06-27 ENCOUNTER — Ambulatory Visit: Payer: Medicare HMO | Admitting: Neurology

## 2021-06-27 ENCOUNTER — Encounter: Payer: Self-pay | Admitting: Neurology

## 2021-06-27 ENCOUNTER — Other Ambulatory Visit: Payer: Self-pay

## 2021-06-27 VITALS — BP 136/62 | HR 52 | Ht 69.0 in | Wt 197.6 lb

## 2021-06-27 DIAGNOSIS — G250 Essential tremor: Secondary | ICD-10-CM | POA: Diagnosis not present

## 2021-06-27 NOTE — Procedures (Signed)
DBS Programming was performed.    Manufacturer of DBS device: [employer]  Total time spent programming was 10 minutes.  Device was confirmed to be on.  Soft start was confirmed to be on.  Impedences were checked and were within normal limits.  Battery was checked and was determined to be functioning normally and not near the end of life.  Final settings were as follows:   Active Contact Amplitude (mA) PW (ms) Frequency (hz) Side Effects Battery  Left Brain        04/10/20 0-1+ 2.2 90 150  2.96  04/10/21 0-1+ 2.2 90 150  2.93  06/27/21 0-1+ 2.2 90 150  2.92                          Right Brain        04/10/20 2-1+ 2.7 90 150  2.91  04/10/21 2-1+ 2.7 90 150  2.80  06/27/21 2-1+ 2.7 90 150  2.75

## 2021-08-20 ENCOUNTER — Encounter: Payer: Self-pay | Admitting: Urology

## 2021-10-09 ENCOUNTER — Encounter: Payer: Self-pay | Admitting: Gastroenterology

## 2021-10-10 ENCOUNTER — Ambulatory Visit: Payer: Medicare HMO | Admitting: Anesthesiology

## 2021-10-10 ENCOUNTER — Encounter: Payer: Self-pay | Admitting: Gastroenterology

## 2021-10-10 ENCOUNTER — Ambulatory Visit
Admission: RE | Admit: 2021-10-10 | Discharge: 2021-10-10 | Disposition: A | Discharge status: Home / Self Care | Payer: Medicare HMO | Attending: Gastroenterology | Admitting: Gastroenterology

## 2021-10-10 ENCOUNTER — Encounter
Admission: RE | Disposition: A | Discharge status: Home / Self Care | Payer: Self-pay | Source: Home / Self Care | Attending: Gastroenterology

## 2021-10-10 DIAGNOSIS — Z8719 Personal history of other diseases of the digestive system: Secondary | ICD-10-CM | POA: Insufficient documentation

## 2021-10-10 DIAGNOSIS — K5520 Angiodysplasia of colon without hemorrhage: Secondary | ICD-10-CM | POA: Insufficient documentation

## 2021-10-10 DIAGNOSIS — Z1211 Encounter for screening for malignant neoplasm of colon: Secondary | ICD-10-CM | POA: Insufficient documentation

## 2021-10-10 DIAGNOSIS — K640 First degree hemorrhoids: Secondary | ICD-10-CM | POA: Insufficient documentation

## 2021-10-10 DIAGNOSIS — Z87891 Personal history of nicotine dependence: Secondary | ICD-10-CM | POA: Diagnosis not present

## 2021-10-10 DIAGNOSIS — G4730 Sleep apnea, unspecified: Secondary | ICD-10-CM | POA: Diagnosis not present

## 2021-10-10 DIAGNOSIS — Z86010 Personal history of colonic polyps: Secondary | ICD-10-CM | POA: Insufficient documentation

## 2021-10-10 DIAGNOSIS — I10 Essential (primary) hypertension: Secondary | ICD-10-CM | POA: Insufficient documentation

## 2021-10-10 HISTORY — PX: COLONOSCOPY: SHX5424

## 2021-10-10 HISTORY — DX: Unspecified atrial flutter: I48.92

## 2021-10-10 HISTORY — DX: Sleep apnea, unspecified: G47.30

## 2021-10-10 SURGERY — COLONOSCOPY
Anesthesia: General

## 2021-10-10 MED ORDER — ONDANSETRON HCL 4 MG/2ML IJ SOLN
INTRAMUSCULAR | Status: DC | PRN
  Administered 2021-10-10: 4 mg via INTRAVENOUS

## 2021-10-10 MED ORDER — SODIUM CHLORIDE 0.9 % IV SOLN
INTRAVENOUS | Status: DC
  Administered 2021-10-10: 1000 mL via INTRAVENOUS

## 2021-10-10 MED ORDER — PROPOFOL 500 MG/50ML IV EMUL
INTRAVENOUS | Status: DC | PRN
  Administered 2021-10-10: 150 ug/kg/min via INTRAVENOUS

## 2021-10-10 MED ORDER — ONDANSETRON HCL 4 MG/2ML IJ SOLN
INTRAMUSCULAR | Status: AC
  Filled 2021-10-10: qty 2

## 2021-10-10 MED ORDER — LIDOCAINE HCL (CARDIAC) PF 100 MG/5ML IV SOSY
PREFILLED_SYRINGE | INTRAVENOUS | Status: DC | PRN
  Administered 2021-10-10: 80 mg via INTRAVENOUS

## 2021-10-10 MED ORDER — LIDOCAINE HCL (PF) 2 % IJ SOLN
INTRAMUSCULAR | Status: AC
  Filled 2021-10-10: qty 5

## 2021-10-10 MED ORDER — PROPOFOL 500 MG/50ML IV EMUL
INTRAVENOUS | Status: AC
  Filled 2021-10-10: qty 50

## 2021-10-10 NOTE — Op Note (Signed)
[facility] Gastroenterology Patient Name: Carlos Hunt Procedure Date: 10/10/2021 10:38 AM MRN: [id number] Account #: [id number] Date of Birth: June 01, 1946 Admit Type: Outpatient Age: 76 Room: [facility] ENDO ROOM 1 Gender: Male Note Status: Finalized Instrument Name: Colonscope [id number] Procedure:             Colonoscopy Indications:           High risk colon cancer surveillance: Personal history                         of colonic polyps Providers:             Rueben Bash, DO Referring MD:          [name]. Tamala Julian, MD (Referring MD) Medicines:             Monitored Anesthesia Care Complications:         No immediate complications. Estimated blood loss: None. Procedure:             Pre-Anesthesia Assessment:                        - Prior to the procedure, a History and Physical was                         performed, and patient medications and allergies were                         reviewed. The patient is competent. The risks and                         benefits of the procedure and the sedation options and                         risks were discussed with the patient. All questions                         were answered and informed consent was obtained.                         Patient identification and proposed procedure were                         verified by the physician, the nurse, the anesthetist                         and the technician in the endoscopy suite. Mental                         Status Examination: alert and oriented. Airway                         Examination: normal oropharyngeal airway and neck                         mobility. Respiratory Examination: clear to                         auscultation. CV Examination: RRR, no murmurs, no S3  or S4. Prophylactic Antibiotics: The patient does not                         require prophylactic antibiotics. Prior                         Anticoagulants: The patient has taken  no previous                         anticoagulant or antiplatelet agents. ASA Grade                         Assessment: III - A patient with severe systemic                         disease. After reviewing the risks and benefits, the                         patient was deemed in satisfactory condition to                         undergo the procedure. The anesthesia plan was to use                         monitored anesthesia care (MAC). Immediately prior to                         administration of medications, the patient was                         re-assessed for adequacy to receive sedatives. The                         heart rate, respiratory rate, oxygen saturations,                         blood pressure, adequacy of pulmonary ventilation, and                         response to care were monitored throughout the                         procedure. The physical status of the patient was                         re-assessed after the procedure.                        After obtaining informed consent, the colonoscope was                         passed under direct vision. Throughout the procedure,                         the patient's blood pressure, pulse, and oxygen                         saturations were monitored continuously. The  Colonoscope was introduced through the anus and                         advanced to the the terminal ileum, with                         identification of the appendiceal orifice and IC                         valve. The colonoscopy was performed without                         difficulty. The patient tolerated the procedure well.                         The quality of the bowel preparation was evaluated                         using the BBPS [facility] Bowel Preparation Scale) with                         scores of: Right Colon = 3, Transverse Colon = 3 and                         Left Colon = 3 (entire mucosa seen well with no                          residual staining, [place] fragments of stool or opaque                         liquid). The total BBPS score equals 9. The terminal                         ileum, ileocecal valve, appendiceal orifice, and                         rectum were photographed. Findings:      The perianal and digital rectal examinations were normal. Pertinent       negatives include normal sphincter tone.      The terminal ileum appeared normal. Estimated blood loss: none.      Two Isaacson localized angioectasias without bleeding were found in the       ascending colon. Estimated blood loss: none.      Non-bleeding internal hemorrhoids were found during retroflexion. The       hemorrhoids were Grade I (internal hemorrhoids that do not prolapse).       Estimated blood loss: none.      The exam was otherwise without abnormality on direct and retroflexion       views. Impression:            - The examined portion of the ileum was normal.                        - Two non-bleeding colonic angioectasias.                        - Non-bleeding internal hemorrhoids.                        -  The examination was otherwise normal on direct and                         retroflexion views.                        - No specimens collected. Recommendation:        - Discharge patient to home.                        - Resume previous diet.                        - Continue present medications.                        - Repeat colonoscopy in 5 years for surveillance.                        - Return to referring physician as previously                         scheduled.                        - The findings and recommendations were discussed with                         the patient. Procedure Code(s):     --- Professional ---                        [phone], Colonoscopy, flexible; diagnostic, including                         collection of specimen(s) by brushing or washing, when                         performed  (separate procedure) Diagnosis Code(s):     --- Professional ---                        Z86.010, Personal history of colonic polyps                        K64.0, First degree hemorrhoids                        K55.20, Angiodysplasia of colon without hemorrhage CPT copyright 2019 American Medical Association. All rights reserved. The codes documented in this report are preliminary and upon coder review may  be revised to meet current compliance requirements. Attending Participation:      I personally performed the entire procedure. Volney American, DO [name] DO, DO 10/10/2021 11:22:51 AM This report has been signed electronically. Number of Addenda: 0 Note Initiated On: 10/10/2021 10:38 AM Scope Withdrawal Time: 0 hours 12 minutes 50 seconds  Total Procedure Duration: 0 hours 21 minutes 13 seconds  Estimated Blood Loss:  Estimated blood loss: none.      [facility]

## 2021-10-10 NOTE — H&P (Signed)
Pre-Procedure H&P   Patient ID: Carlos Hunt is a 76 y.o. male.  Gastroenterology Provider: [name], DO  Referring Provider: Laurine Blazer, PA PCP: Wardell Honour, MD  Date: 10/10/2021  HPI Mr. Carlos Hunt is a 76 y.o. male who presents today for Colonoscopy for surveillance- phx colon polyps; fhx crc- father (78).  Pt with normal bm; denies melena/hematochezia, diarrhea/constipation.  Last colonoscopy 2017- with ileitis and recommended 5 y repeat. Noted to have vascular alterations in the ascending colon, however, bx were unremarkable.  Has deep brain stimulator- batteries located on both sides of his chest.  Past Medical History:  Diagnosis Date   Adenomatous polyps 04/02/2017   Brain abscess 03/11/2017   Elevated liver enzymes    Erectile dysfunction    Essential hypertension, benign 02/14/2013   Essential tremor    GERD (gastroesophageal reflux disease)    Head injury    Hx of colonic polyps    Hyperlipidemia    Hypertension    New onset atrial flutter (HCC)    Pain in left knee    Pain, joint, shoulder    PONV (postoperative nausea and vomiting)    nausea no vomiting   Seasonal allergies    Sleep apnea    Testicular hypofunction    Tobacco use disorder    Tremor    Unspecified vitamin D deficiency     Past Surgical History:  Procedure Laterality Date   COLONOSCOPY  09/09/2011   normal.  Previous polyps.  [name]. Repeat 5 years.   COLONOSCOPY N/A 07/18/2016   Procedure: COLONOSCOPY;  Surgeon: Manya Silvas, MD;  Location: [facility] ENDOSCOPY;  Service: Endoscopy;  Laterality: N/A;   DEEP BRAIN STIMULATOR PLACEMENT     HAND SURGERY Left 2011   trauma   HERNIA REPAIR     LAPAROSCOPIC INGUINAL HERNIA WITH UMBILICAL HERNIA Right 30/05/2329   Procedure: LAPAROSCOPIC INGUINAL HERNIA REPAIR, right with umbilical herniorrhaphy;  Surgeon: Florene Glen, MD;  Location: [facility] ORS;  Service: General;  Laterality: Right;   MINOR PLACEMENT OF  FIDUCIAL N/A 11/24/2017   Procedure: MINOR PLACEMENT OF FIDUCIAL;  Surgeon: Erline Levine, MD;  Location: [place];  Service: Neurosurgery;  Laterality: N/A;  Fiducial placement   PULSE GENERATOR IMPLANT Bilateral 10/23/2016   Procedure: BILATERAL IMPLANTABLE PULSE GENERATOR;  Surgeon: Erline Levine, MD;  Location: [place];  Service: Neurosurgery;  Laterality: Bilateral;  BILATERAL IMPLANTABLE PULSE GENERATOR   PULSE GENERATOR IMPLANT Left 01/12/2018   Procedure: UNILATERAL PULSE GENERATOR IMPLANT AND EXTENTIONS FOR DEEP BRAIN STIMULATOR;  Surgeon: Erline Levine, MD;  Location: [place];  Service: Neurosurgery;  Laterality: Left;   SUBTHALAMIC STIMULATOR INSERTION Bilateral 10/07/2016   Procedure: BILATERAL DEEP BRAIN STIMULATOR PLACEMENT;  Surgeon: Erline Levine, MD;  Location: [place];  Service: Neurosurgery;  Laterality: Bilateral;  BILATERAL DEEP BRAIN STIMULATOR PLACEMENT   SUBTHALAMIC STIMULATOR INSERTION Left 12/04/2017   Procedure: Left Deep Brain Stimulator Placement;  Surgeon: Erline Levine, MD;  Location: [place];  Service: Neurosurgery;  Laterality: Left;  Left deep brain stimulator placement   SUBTHALAMIC STIMULATOR REMOVAL Left 03/10/2017   Procedure: REMOVAL DEEP BRAIN STIMULATOR AND ASPIRATION OF BRAIN ABSCESS;  Surgeon: Earnie Larsson, MD;  Location: [place];  Service: Neurosurgery;  Laterality: Left;   UMBILICAL HERNIA REPAIR N/A 07/15/2017   Procedure: HERNIA REPAIR UMBILICAL ADULT;  Surgeon: Florene Glen, MD;  Location: [facility] ORS;  Service: General;  Laterality: N/A;    Family History Father (78)- CRC No h/o GI disease  or malignancy  Review of Systems  Constitutional:  Negative for activity change, appetite change, chills, diaphoresis, fatigue, fever and unexpected weight change.  HENT:  Negative for trouble swallowing and voice change.   Respiratory:  Negative for shortness of breath and wheezing.   Cardiovascular:  Negative for chest pain, palpitations and leg swelling.   Gastrointestinal:  Negative for abdominal distention, abdominal pain, anal bleeding, blood in stool, constipation, diarrhea, nausea and vomiting.  Musculoskeletal:  Negative for arthralgias and myalgias.  Skin:  Negative for color change and pallor.  Neurological:  Negative for dizziness, syncope and weakness.  Psychiatric/Behavioral:  Negative for confusion. The patient is not nervous/anxious.   All other systems reviewed and are negative.   Medications No current facility-administered medications on file prior to encounter.   Current Outpatient Medications on File Prior to Encounter  Medication Sig Dispense Refill   Ascorbic Acid (VITAMIN C) 1000 MG tablet Take 1,000 mg by mouth daily.     aspirin EC 81 MG tablet Take 81 mg by mouth daily. Swallow whole.     atorvastatin (LIPITOR) 40 MG tablet Take 40 mg by mouth daily.     Cholecalciferol (VITAMIN D3) 2000 units TABS Take 2,000 Units by mouth daily.     Cyanocobalamin (B-12) 3000 MCG CAPS Take 3,000 mcg by mouth daily.     cycloSPORINE (RESTASIS) 0.05 % ophthalmic emulsion 1 drop 2 (two) times daily.     Digestive Enzymes (PAPAYA AND ENZYMES PO) Take 1 tablet by mouth daily as needed (for heartburn).      hydrALAZINE (APRESOLINE) 10 MG tablet Take 10 mg by mouth 2 (two) times daily.     hydrochlorothiazide (HYDRODIURIL) 25 MG tablet Take 1 tablet (25 mg total) by mouth daily. 90 tablet 3   metroNIDAZOLE (METROGEL) 1 % gel Apply topically daily.     Omega-3 Fatty Acids (OMEGA-3 EPA FISH OIL PO) Take by mouth daily.     Tetrahydroz-Glyc-Hyprom-PEG (VISINE MAXIMUM REDNESS RELIEF OP) Place 1 drop into both eyes daily as needed (for irritation).      triamcinolone cream (KENALOG) 0.1 % Apply 1 application topically 2 (two) times daily.     valsartan (DIOVAN) 320 MG tablet Take 320 mg by mouth daily.     aspirin 325 MG tablet Take 325 mg by mouth daily.     fluticasone (FLONASE) 50 MCG/ACT nasal spray Place 1 spray into both nostrils daily  as needed for allergies or rhinitis.     Methylsulfonylmethane (MSM) 1000 MG CAPS Take 1,000 mg by mouth daily.     metoprolol succinate (TOPROL-XL) 100 MG 24 hr tablet Take 100 mg by mouth 2 (two) times daily. Take with or immediately following a meal. (Patient not taking: Reported on 10/10/2021)     Multiple Vitamins-Minerals (CENTRUM PO) Take 1 tablet by mouth daily.       Pertinent medications related to GI and procedure were reviewed by me with the patient prior to the procedure   Current Facility-Administered Medications:    0.9 %  sodium chloride infusion, , Intravenous, Continuous, [name], DO, Last Rate: 20 mL/hr at 10/10/21 1010, Continued from Pre-op at 10/10/21 1010      Allergies  Allergen Reactions   Amlodipine     Other reaction(s): Headache   Procaine Nausea And Vomiting and Other (See Comments)    NOVOCAINE   Sulfacetamide Sodium-Sulfur Rash   Allergies were reviewed by me prior to the procedure  Objective    Vitals:   10/10/21  0941  BP: (!) 160/72  Pulse: (!) 43  Resp: 18  Temp: (!) 96.4 F (35.8 C)  TempSrc: Temporal  SpO2: 100%  Weight: 84.5 kg  Height: 5' 9"  (1.753 m)     Physical Exam Vitals and nursing note reviewed.  Constitutional:      General: He is not in acute distress.    Appearance: Normal appearance. He is not ill-appearing, toxic-appearing or diaphoretic.  HENT:     Head: Normocephalic and atraumatic.     Nose: Nose normal.     Mouth/Throat:     Mouth: Mucous membranes are moist.     Pharynx: Oropharynx is clear.  Eyes:     General: No scleral icterus.    Extraocular Movements: Extraocular movements intact.  Cardiovascular:     Rate and Rhythm: Regular rhythm. Bradycardia present.     Heart sounds: Normal heart sounds. No murmur heard.   No friction rub. No gallop.  Pulmonary:     Effort: Pulmonary effort is normal. No respiratory distress.     Breath sounds: Normal breath sounds. No wheezing, rhonchi or rales.   Abdominal:     General: Bowel sounds are normal. There is no distension.     Palpations: Abdomen is soft.     Tenderness: There is no abdominal tenderness. There is no guarding or rebound.  Musculoskeletal:     Cervical back: Neck supple.     Right lower leg: No edema.     Left lower leg: No edema.  Skin:    General: Skin is warm and dry.     Coloration: Skin is not jaundiced or pale.     Comments: Deep brain stimulator batteries on bilateral chest wall  Neurological:     General: No focal deficit present.     Mental Status: He is alert and oriented to person, place, and time. Mental status is at baseline.  Psychiatric:        Mood and Affect: Mood normal.        Behavior: Behavior normal.        Thought Content: Thought content normal.        Judgment: Judgment normal.     Assessment:  Mr. Carlos Hunt is a 76 y.o. male  who presents today for Colonoscopy for surveillance- phx colon polyps; fhx crc- father (78).  Plan:  Colonoscopy with possible intervention today  Colonoscopy with possible biopsy, control of bleeding, polypectomy, and interventions as necessary has been discussed with the patient/patient representative. Informed consent was obtained from the patient/patient representative after explaining the indication, nature, and risks of the procedure including but not limited to death, bleeding, perforation, missed neoplasm/lesions, cardiorespiratory compromise, and reaction to medications. Opportunity for questions was given and appropriate answers were provided. Patient/patient representative has verbalized understanding is amenable to undergoing the procedure.   [name], DO  [facility] Gastroenterology  Portions of the record may have been created with voice recognition software. Occasional wrong-word or 'sound-a-like' substitutions may have occurred due to the inherent limitations of voice recognition software.  Read the chart carefully and recognize,  using context, where substitutions may have occurred.

## 2021-10-10 NOTE — Interval H&P Note (Signed)
History and Physical Interval Note: Preprocedure H&P from 10/10/21  was reviewed and there was no interval change after seeing and examining the patient.  Written consent was obtained from the patient after discussion of risks, benefits, and alternatives. Patient has consented to proceed with Colonoscopy with possible intervention   10/10/2021 10:45 AM  [name]  has presented today for surgery, with the diagnosis of Personal history of colonic polyps (Z86.010).  The various methods of treatment have been discussed with the patient and family. After consideration of risks, benefits and other options for treatment, the patient has consented to  Procedure(s): COLONOSCOPY (N/A) as a surgical intervention.  The patient's history has been reviewed, patient examined, no change in status, stable for surgery.  I have reviewed the patient's chart and labs.  Questions were answered to the patient's satisfaction.     [name]

## 2021-10-10 NOTE — Anesthesia Procedure Notes (Signed)
Date/Time: 10/10/2021 11:01 AM Performed by: Vaughan Sine Pre-anesthesia Checklist: Patient identified, Emergency Drugs available, Suction available, Patient being monitored and Timeout performed Patient Re-evaluated:Patient Re-evaluated prior to induction Oxygen Delivery Method: Nasal cannula Preoxygenation: Pre-oxygenation with 100% oxygen Induction Type: IV induction Placement Confirmation: positive ETCO2 and CO2 detector

## 2021-10-10 NOTE — Anesthesia Postprocedure Evaluation (Signed)
Anesthesia Post Note  Patient: Carlos Hunt  Procedure(s) Performed: COLONOSCOPY  Patient location during evaluation: Phase II Anesthesia Type: General Level of consciousness: awake and alert, awake and oriented Pain management: pain level controlled Vital Signs Assessment: post-procedure vital signs reviewed and stable Respiratory status: spontaneous breathing, nonlabored ventilation and respiratory function stable Cardiovascular status: blood pressure returned to baseline and stable Postop Assessment: no apparent nausea or vomiting Anesthetic complications: no   No notable events documented.   Last Vitals:  Vitals:   10/10/21 1144 10/10/21 1154  BP: 124/69 (!) 118/53  Pulse: (!) 51 (!) 59  Resp: 19 18  Temp:    SpO2: 100% 100%    Last Pain:  Vitals:   10/10/21 1154  TempSrc:   PainSc: 0-No pain                 Phill Mutter

## 2021-10-10 NOTE — Transfer of Care (Signed)
Immediate Anesthesia Transfer of Care Note  Patient: Carlos Hunt  Procedure(s) Performed: COLONOSCOPY  Patient Location: PACU  Anesthesia Type:General  Level of Consciousness: awake and sedated  Airway & Oxygen Therapy: Patient Spontanous Breathing and Patient connected to nasal cannula oxygen  Post-op Assessment: Report given to RN and Post -op Vital signs reviewed and stable  Post vital signs: Reviewed and stable  Last Vitals:  Vitals Value Taken Time  BP    Temp    Pulse    Resp    SpO2      Last Pain:  Vitals:   10/10/21 0941  TempSrc: Temporal  PainSc: 0-No pain         Complications: No notable events documented.

## 2021-10-10 NOTE — Anesthesia Preprocedure Evaluation (Signed)
Anesthesia Evaluation  Patient identified by MRN, date of birth, ID band Patient awake    Reviewed: Allergy & Precautions, NPO status , Patient's Chart, lab work & pertinent test results, reviewed documented beta blocker date and time   History of Anesthesia Complications (+) PONV and history of anesthetic complications  Airway Mallampati: III  TM Distance: >3 FB Neck ROM: Full    Dental  (+) Poor Dentition   Pulmonary sleep apnea , former smoker,    Pulmonary exam normal        Cardiovascular hypertension, Pt. on medications and Pt. on home beta blockers negative cardio ROS Normal cardiovascular exam     Neuro/Psych Brain Implant for Tremors  Neuromuscular disease negative psych ROS   GI/Hepatic Neg liver ROS, Bowel prep,GERD  ,  Endo/Other  negative endocrine ROS  Renal/GU Renal disease  negative genitourinary   Musculoskeletal negative musculoskeletal ROS (+)   Abdominal   Peds negative pediatric ROS (+)  Hematology negative hematology ROS (+)   Anesthesia Other Findings Adenomatous polyps 04/02/2017   Brain abscess 03/11/2017   Elevated liver enzymes    Erectile dysfunction    Essential hypertension, benign 02/14/2013  Essential tremor    GERD (gastroesophageal reflux disease)   Head injury    Hx of colonic polyps    Hyperlipidemia    Hypertension    New onset atrial flutter (HCC)    Pain in left knee    Pain, joint, shoulder    PONV    Sleep apnea    Testicular hypofunction    Tobacco use disorder    Tremor    Unspecified vitamin D deficiency       Reproductive/Obstetrics negative OB ROS                            Anesthesia Physical Anesthesia Plan  ASA: 3  Anesthesia Plan: General   Post-op Pain Management:    Induction: Intravenous  PONV Risk Score and Plan: 2 and TIVA and Propofol infusion  Airway Management Planned: Natural Airway and Nasal  Cannula  Additional Equipment:   Intra-op Plan:   Post-operative Plan:   Informed Consent: I have reviewed the patients History and Physical, chart, labs and discussed the procedure including the risks, benefits and alternatives for the proposed anesthesia with the patient or authorized representative who has indicated his/her understanding and acceptance.       Plan Discussed with: CRNA, Anesthesiologist and Surgeon  Anesthesia Plan Comments:         Anesthesia Quick Evaluation

## 2021-10-14 ENCOUNTER — Ambulatory Visit: Payer: Medicare HMO | Admitting: Neurology

## 2021-10-15 ENCOUNTER — Other Ambulatory Visit: Payer: Self-pay

## 2021-10-15 ENCOUNTER — Encounter: Payer: Self-pay | Admitting: Neurology

## 2021-10-15 ENCOUNTER — Ambulatory Visit: Payer: Medicare HMO | Admitting: Neurology

## 2021-10-15 DIAGNOSIS — G250 Essential tremor: Secondary | ICD-10-CM

## 2021-10-15 NOTE — Progress Notes (Signed)
Assessment/Plan:    1.  Essential Tremor  -Status post bilateral VIM DBS in January, 2018.  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  Patient subsequently had a left VIM DBS on December 04, 2017 with IPG placement on Jan 12, 2018.  -Patient's IPG on the right is at end-of-life and needs battery replacement.  Talked to the patient about the options, including rechargeable device either through [employer] or [employer].  Talked about benefits of both of those options.  Talked to him about continuing with a nonrechargeable device through [employer], but newer battery and bipolar mode will not last nearly as long, perhaps with a life of only 2 years as opposed to the 4 years he has gotten out of his current battery.  Ultimately, he stated that he would like to go with the nonrechargeable [employer]tronic battery.  We will see if we can make any adjustments to extend the life of the battery.  -I contacted Dr. Reatha Armour while the patient was in the room so Dr. Reatha Armour was aware of the patient needing battery replacement (since the patient had previously seen [name] who is no longer available).  -I contacted the [employer] rep about the patient  -I slightly turned down the right side, hopefully to preserve him just a bit until his battery is changed.  I will turn him back up in a few weeks after his battery is able to be changed.  2.  Atrial flutter with rapid ventricular response  -  Off metoprolol after successful flutter ablation, as well as bradycardia due to metoprolol.  Off of Eliquis and on aspirin alone.  Following with cardiology at [facility].  Follow-up in 8 months.  Asked the patient to check his DBS system monthly to make sure that the right IPG is not depleting.  Subjective:   Carlos Hunt was seen today in follow up for essential tremor.  Records reviewed.  Patient reports tremor just a little bit worse on both sides.  Not having any difficulty with  activities of daily living.  Current prescribed movement disorder medications: n/a   ALLERGIES:   Allergies  Allergen Reactions   Amlodipine     Other reaction(s): Headache   Procaine Nausea And Vomiting and Other (See Comments)    NOVOCAINE   Sulfacetamide Sodium-Sulfur Rash    CURRENT MEDICATIONS:  Outpatient Encounter Medications as of 10/15/2021  Medication Sig   Ascorbic Acid (VITAMIN C) 1000 MG tablet Take 1,000 mg by mouth daily.   aspirin 325 MG tablet Take 325 mg by mouth daily.   aspirin EC 81 MG tablet Take 81 mg by mouth daily. Swallow whole.   atorvastatin (LIPITOR) 40 MG tablet Take 40 mg by mouth daily.   Cholecalciferol (VITAMIN D3) 2000 units TABS Take 2,000 Units by mouth daily.   Cyanocobalamin (B-12) 3000 MCG CAPS Take 3,000 mcg by mouth daily.   cycloSPORINE (RESTASIS) 0.05 % ophthalmic emulsion 1 drop 2 (two) times daily.   Digestive Enzymes (PAPAYA AND ENZYMES PO) Take 1 tablet by mouth daily as needed (for heartburn).    fluticasone (FLONASE) 50 MCG/ACT nasal spray Place 1 spray into both nostrils daily as needed for allergies or rhinitis.   hydrALAZINE (APRESOLINE) 10 MG tablet Take 10 mg by mouth 2 (two) times daily.   hydrochlorothiazide (HYDRODIURIL) 25 MG tablet Take 1 tablet (25 mg total) by mouth daily.   Methylsulfonylmethane (MSM) 1000 MG CAPS Take  1,000 mg by mouth daily.   metoprolol succinate (TOPROL-XL) 100 MG 24 hr tablet Take 100 mg by mouth 2 (two) times daily. Take with or immediately following a meal. (Patient not taking: Reported on 10/10/2021)   metroNIDAZOLE (METROGEL) 1 % gel Apply topically daily.   Multiple Vitamins-Minerals (CENTRUM PO) Take 1 tablet by mouth daily.    Omega-3 Fatty Acids (OMEGA-3 EPA FISH OIL PO) Take by mouth daily.   Tetrahydroz-Glyc-Hyprom-PEG (VISINE MAXIMUM REDNESS RELIEF OP) Place 1 drop into both eyes daily as needed (for irritation).    triamcinolone cream (KENALOG) 0.1 % Apply 1 application topically 2 (two)  times daily.   valsartan (DIOVAN) 320 MG tablet Take 320 mg by mouth daily.   No facility-administered encounter medications on file as of 10/15/2021.     Objective:    PHYSICAL EXAMINATION:    VITALS:   Vitals:   10/15/21 0811  BP: (!) 154/68  Pulse: (!) 59  SpO2: 98%  Weight: 191 lb 12.8 oz (87 kg)  Height: 5' 10"  (1.778 m)     GEN:  The patient appears stated age and is in NAD. HEENT:  Normocephalic, atraumatic.  The mucous membranes are moist. The superficial temporal arteries are without ropiness or tenderness.   Neurological examination:  Orientation: The patient is alert and oriented x3. Cranial nerves: There is good facial symmetry. The speech is fluent and clear. Soft palate rises symmetrically and there is no tongue deviation. Hearing is intact to conversational tone. Sensation: Sensation is intact to light touch throughout Motor: Strength is at least antigravity x4.  Movement examination: Tone: There is normal tone in the UE/LE Abnormal movements: There is no rest tremor.  There is rare intention tremor on the left. Coordination:  There is no decremation with RAM's Gait and Station: The patient has no difficulty arising out of a deep-seated chair without the use of the hands. The patient's stride length is good I have reviewed and interpreted the following labs independently Patient had lab work through primary care on April 29, 2021.  White blood cells 9.6, hemoglobin 15.5, hematocrit 45.9, platelets 267.  Sodium 141, potassium 4.1, chloride 104, CO2 27, BUN 18, AST of 24, ALT 29   Cc:  Wardell Honour, MD

## 2021-10-15 NOTE — Procedures (Signed)
DBS Programming was performed.    Manufacturer of DBS device: [employer]  Total time spent programming was 10 minutes.  Device was confirmed to be on.  Soft start was confirmed to be on.  Impedences were checked and were within normal limits.  Battery was checked and was near the end of life on the right.  Final settings were as follows:   Active Contact Amplitude (mA) PW (ms) Frequency (hz) Side Effects Battery  Left Brain        04/10/20 0-1+ 2.2 90 150  2.96  04/10/21 0-1+ 2.2 90 150  2.93  06/27/21 0-1+ 2.2 90 150  2.92  10/15/21 0-1+ 2.2 90 150  2.90                          Right Brain        04/10/20 2-1+ 2.7 90 150  2.91  04/10/21 2-1+ 2.7 90 150  2.80  06/27/21 2-1+ 2.7 90 150  2.75  10/15/21 2-1+ 2.5 90 140  2.58

## 2021-10-21 ENCOUNTER — Other Ambulatory Visit: Payer: Self-pay | Admitting: Neurological Surgery

## 2021-10-23 ENCOUNTER — Other Ambulatory Visit: Payer: Self-pay | Admitting: Neurological Surgery

## 2021-10-29 ENCOUNTER — Encounter (HOSPITAL_COMMUNITY): Payer: Self-pay | Admitting: Neurological Surgery

## 2021-10-29 ENCOUNTER — Other Ambulatory Visit
Admission: RE | Admit: 2021-10-29 | Discharge: 2021-10-29 | Disposition: A | Discharge status: Home / Self Care | Payer: Medicare HMO | Source: Ambulatory Visit | Attending: Neurological Surgery | Admitting: Neurological Surgery

## 2021-10-29 ENCOUNTER — Ambulatory Visit (HOSPITAL_COMMUNITY): Payer: Medicare HMO | Admitting: Physician Assistant

## 2021-10-29 ENCOUNTER — Other Ambulatory Visit: Payer: Self-pay

## 2021-10-29 DIAGNOSIS — Z01812 Encounter for preprocedural laboratory examination: Secondary | ICD-10-CM | POA: Diagnosis present

## 2021-10-29 DIAGNOSIS — Z20822 Contact with and (suspected) exposure to covid-19: Secondary | ICD-10-CM | POA: Diagnosis not present

## 2021-10-29 LAB — SARS CORONAVIRUS 2 (TAT 6-24 HRS): SARS Coronavirus 2: NEGATIVE

## 2021-10-29 NOTE — Progress Notes (Signed)
PCP - Dr. [name] Cardiologist - pt denies (he had ablation for a-fib, per pt has yet to go back to see a cardiologist)  EKG - 04/29/21 CE (requested) Chest x-ray -  ECHO - 07/19/19 CE (requested)  Cardiac Cath - denies CPAP -   Aspirin Instructions: Follow your surgeon's instructions on when to stop Aspirin.  If no instructions were given by your surgeon then you will need to call the office to get those instructions.    Per pt he stopped ASA 5 days ago (10/24/21)   ERAS Protcol - n/a COVID TEST- pending 10/29/21  Anesthesia review: yes  -------------  SDW INSTRUCTIONS:  Your procedure is scheduled on 10/30/21. Please report to [facility] Main Entrance "A" at 05:30 A.M., and check in at the Admitting office. Call this number if you have problems the morning of surgery: [phone]   Remember: Do not eat or drink after midnight the night before your surgery  Medications to take morning of surgery with a sip of water include: Hydralazine  Follow your surgeon's instructions on when to stop Aspirin.  If no instructions were given by your surgeon then you will need to call the office to get those instructions.    As of today, STOP taking any Aleve, Naproxen, Ibuprofen, Motrin, Advil, Goody's, BC's, all herbal medications, fish oil, and all vitamins.    The Morning of Surgery Do not wear jewelry Do not wear lotions, powders, colognes, or deodorant Do not bring valuables to the hospital. [facility] is not responsible for any belongings or valuables.  If you are a smoker, DO NOT Smoke 24 hours prior to surgery  If you wear a CPAP at night please bring your mask the morning of surgery   Remember that you must have someone to transport you home after your surgery, and remain with you for 24 hours if you are discharged the same day.  Please bring cases for contacts, glasses, hearing aids, dentures or bridgework because it cannot be worn into surgery.   Patients discharged the  day of surgery will not be allowed to drive home.   Please shower the NIGHT BEFORE/MORNING OF SURGERY (use antibacterial soap like DIAL soap if possible). Wear comfortable clothes the morning of surgery. Oral Hygiene is also important to reduce your risk of infection.  Remember - BRUSH YOUR TEETH THE MORNING OF SURGERY WITH YOUR REGULAR TOOTHPASTE  Patient denies shortness of breath, fever, cough and chest pain.

## 2021-10-29 NOTE — Progress Notes (Signed)
Anesthesia Chart Review:  History of atrial flutter s/p successful ablation December 2020 at [facility].  Subsequent Holter monitor showed no significant arrhythmia.  Metoprolol was discontinued due to bradycardia.  Last seen by cardiology 09/14/2019 and at that time advised to follow-up on an as-needed basis.  Follows with neurologist Dr. Carles Collet for history of essential tremor s/p deep brain stimulator implant.  Patient will need day of surgery labs and evaluation.  EKG 04/29/2021 (Care Everywhere): Sinus bradycardia.  Rate 44.  TTE 07/19/2019 (Care Everywhere): INTERPRETATION  MILD LV SYSTOLIC DYSFUNCTION (See above)   WITH MILD LVH  NORMAL RIGHT VENTRICULAR SYSTOLIC FUNCTION  MILD VALVULAR REGURGITATION (See above)  NO VALVULAR STENOSIS  NO PERICARDIAL EFFUSION  MILDLY ENLARGED LEFT ATRIUM  MILDLY DILATED IVC  NO PRIOR STUDY   Nuclear stress 06/22/2015: Nuclear stress EF: 53%. Blood pressure demonstrated a hypertensive response to exercise. Baseline BP was elevated. Horizontal ST segment depression ST segment depression was noted during stress in the III, V5 and V6 leads, and returning to baseline after less than 1 minute of recovery. The study is normal. This is a low risk study. The left ventricular ejection fraction is mildly decreased (45-54%).   Normal study with no infarct and no ischemia.  There was baseline hypertension with hypertensive response to stress.    Wynonia Musty [facility]/Anesthesiology Phone [phone] 10/29/2021 12:42 PM

## 2021-10-29 NOTE — Anesthesia Preprocedure Evaluation (Deleted)
Anesthesia Evaluation  Patient identified by MRN, date of birth, ID band Patient awake    Reviewed: Allergy & Precautions, NPO status , Patient's Chart, lab work & pertinent test results  Airway Mallampati: II  TM Distance: >3 FB Neck ROM: Full    Dental  (+) Chipped, Loose   Pulmonary sleep apnea , former smoker,    Pulmonary exam normal breath sounds clear to auscultation       Cardiovascular hypertension, Pt. on medications Normal cardiovascular exam Rhythm:Regular Rate:Normal     Neuro/Psych    GI/Hepatic negative GI ROS, Neg liver ROS,   Endo/Other  negative endocrine ROS  Renal/GU negative Renal ROS     Musculoskeletal negative musculoskeletal ROS (+)   Abdominal   Peds  Hematology negative hematology ROS (+)   Anesthesia Other Findings ESSENTIAL TREMOR  Reproductive/Obstetrics                            Anesthesia Physical Anesthesia Plan  ASA: 2  Anesthesia Plan: General   Post-op Pain Management:    Induction: Intravenous  PONV Risk Score and Plan: 2 and Ondansetron, Dexamethasone and Treatment may vary due to age or medical condition  Airway Management Planned: LMA  Additional Equipment:   Intra-op Plan:   Post-operative Plan: Extubation in OR  Informed Consent: I have reviewed the patients History and Physical, chart, labs and discussed the procedure including the risks, benefits and alternatives for the proposed anesthesia with the patient or authorized representative who has indicated his/her understanding and acceptance.     Dental advisory given  Plan Discussed with: CRNA  Anesthesia Plan Comments: (PAT note by Karoline Caldwell, PA-C:  History of atrial flutter s/p successful ablation December 2020 at [facility].  Subsequent Holter monitor showed no significant arrhythmia.  Metoprolol was discontinued due to bradycardia.  Last seen by cardiology 09/14/2019 and at that  time advised to follow-up on an as-needed basis.  Follows with neurologist Dr. Carles Collet for history of essential tremor s/p deep brain stimulator implant.  Patient will need day of surgery labs and evaluation.  EKG 04/29/2021 (Care Everywhere): Sinus bradycardia.  Rate 44.  TTE 07/19/2019 (Care Everywhere): INTERPRETATION  MILD LV SYSTOLIC DYSFUNCTION (See above)  WITH MILD LVH  NORMAL RIGHT VENTRICULAR SYSTOLIC FUNCTION  MILD VALVULAR REGURGITATION (See above)  NO VALVULAR STENOSIS  NO PERICARDIAL EFFUSION  MILDLY ENLARGED LEFT ATRIUM  MILDLY DILATED IVC  NO PRIOR STUDY   Nuclear stress 06/22/2015:  Nuclear stress EF: 53%.  Blood pressure demonstrated a hypertensive response to exercise. Baseline BP was elevated.  Horizontal ST segment depression ST segment depression was noted during stress in the III, V5 and V6 leads, and returning to baseline after less than 1 minute of recovery.  The study is normal.  This is a low risk study.  The left ventricular ejection fraction is mildly decreased (45-54%).  Normal study with no infarct and no ischemia.  There was baseline hypertension with hypertensive response to stress.  )       Anesthesia Quick Evaluation

## 2021-10-30 ENCOUNTER — Encounter (HOSPITAL_COMMUNITY): Payer: Self-pay | Admitting: Neurological Surgery

## 2021-10-30 ENCOUNTER — Encounter (HOSPITAL_COMMUNITY)
Admission: RE | Disposition: A | Discharge status: Home / Self Care | Payer: Self-pay | Source: Home / Self Care | Attending: Neurological Surgery

## 2021-10-30 ENCOUNTER — Other Ambulatory Visit: Payer: Self-pay

## 2021-10-30 ENCOUNTER — Ambulatory Visit (HOSPITAL_COMMUNITY)
Admission: RE | Admit: 2021-10-30 | Discharge: 2021-10-30 | Disposition: A | Discharge status: Home / Self Care | Payer: Medicare HMO | Attending: Neurological Surgery | Admitting: Neurological Surgery

## 2021-10-30 DIAGNOSIS — Z4549 Encounter for adjustment and management of other implanted nervous system device: Secondary | ICD-10-CM | POA: Insufficient documentation

## 2021-10-30 DIAGNOSIS — Z538 Procedure and treatment not carried out for other reasons: Secondary | ICD-10-CM | POA: Diagnosis not present

## 2021-10-30 DIAGNOSIS — G250 Essential tremor: Secondary | ICD-10-CM | POA: Insufficient documentation

## 2021-10-30 LAB — CBC
HCT: 43.4 % (ref 39.0–52.0)
Hemoglobin: 15.2 g/dL (ref 13.0–17.0)
MCH: 32 pg (ref 26.0–34.0)
MCHC: 35 g/dL (ref 30.0–36.0)
MCV: 91.4 fL (ref 80.0–100.0)
Platelets: 264 10*3/uL (ref 150–400)
RBC: 4.75 MIL/uL (ref 4.22–5.81)
RDW: 13 % (ref 11.5–15.5)
WBC: 8.6 10*3/uL (ref 4.0–10.5)
nRBC: 0 % (ref 0.0–0.2)

## 2021-10-30 LAB — BASIC METABOLIC PANEL
Anion gap: 12 (ref 5–15)
BUN: 18 mg/dL (ref 8–23)
CO2: 25 mmol/L (ref 22–32)
Calcium: 9.2 mg/dL (ref 8.9–10.3)
Chloride: 101 mmol/L (ref 98–111)
Creatinine, Ser: 1.14 mg/dL (ref 0.61–1.24)
GFR, Estimated: >60 mL/min (ref >60)
Glucose, Bld: 104 mg/dL — ABNORMAL HIGH (ref 70–99)
Potassium: 3.7 mmol/L (ref 3.5–5.1)
Sodium: 138 mmol/L (ref 135–145)

## 2021-10-30 SURGERY — SUBTHALAMIC STIMULATOR BATTERY REPLACEMENT
Anesthesia: General | Laterality: Right

## 2021-10-30 MED ORDER — LIDOCAINE-EPINEPHRINE 1 %-1:100000 IJ SOLN
INTRAMUSCULAR | Status: AC
  Filled 2021-10-30: qty 1

## 2021-10-30 MED ORDER — PROPOFOL 10 MG/ML IV BOLUS
INTRAVENOUS | Status: AC
  Filled 2021-10-30: qty 20

## 2021-10-30 MED ORDER — LACTATED RINGERS IV SOLN: INTRAVENOUS | Status: DC

## 2021-10-30 MED ORDER — CHLORHEXIDINE GLUCONATE CLOTH 2 % EX PADS: 6.0000 | MEDICATED_PAD | Freq: Once | CUTANEOUS | Status: DC

## 2021-10-30 MED ORDER — CHLORHEXIDINE GLUCONATE 0.12 % MT SOLN: 15.0000 mL | Freq: Once | OROMUCOSAL | Status: AC

## 2021-10-30 MED ORDER — CHLORHEXIDINE GLUCONATE 0.12 % MT SOLN
OROMUCOSAL | Status: AC
  Administered 2021-10-30: 15 mL via OROMUCOSAL
  Filled 2021-10-30: qty 15

## 2021-10-30 MED ORDER — FENTANYL CITRATE (PF) 250 MCG/5ML IJ SOLN
INTRAMUSCULAR | Status: AC
  Filled 2021-10-30: qty 5

## 2021-10-30 MED ORDER — ORAL CARE MOUTH RINSE: 15.0000 mL | Freq: Once | OROMUCOSAL | Status: AC

## 2021-10-30 MED ORDER — CEFAZOLIN SODIUM-DEXTROSE 2-4 GM/100ML-% IV SOLN
2.0000 g | INTRAVENOUS | Status: DC
  Filled 2021-10-30: qty 100

## 2021-10-30 MED ORDER — VANCOMYCIN HCL 1000 MG IV SOLR
INTRAVENOUS | Status: AC
  Filled 2021-10-30: qty 20

## 2021-10-30 NOTE — Progress Notes (Signed)
Surgery cancelled per Dr. Reatha Armour, IV removed, pt dressed, d/c to home

## 2021-10-30 NOTE — H&P (Signed)
Providing Compassionate, Quality Care - Together  NEUROSURGERY HISTORY & PHYSICAL   Carlos Hunt is an 76 y.o. male.   Chief Complaint: End-of-life IPG HPI: This is a 76 year old male with a history of essential tremor, status post DBS placement for treatment of this.  He presents with end-of-life battery for his right chest IPG.  He is interested in a Air traffic controller.  Past Medical History:  Diagnosis Date   Adenomatous polyps 04/02/2017   Brain abscess 03/11/2017   Elevated liver enzymes    Erectile dysfunction    Essential hypertension, benign 02/14/2013   Essential tremor    GERD (gastroesophageal reflux disease)    Head injury    Hx of colonic polyps    Hyperlipidemia    Hypertension    New onset atrial flutter (HCC)    Pain in left knee    Pain, joint, shoulder    PONV (postoperative nausea and vomiting)    nausea no vomiting   Seasonal allergies    Sleep apnea    Testicular hypofunction    Tobacco use disorder    Tremor    Unspecified vitamin D deficiency     Past Surgical History:  Procedure Laterality Date   COLONOSCOPY  09/09/2011   normal.  Previous polyps.  [name]. Repeat 5 years.   COLONOSCOPY N/A 07/18/2016   Procedure: COLONOSCOPY;  Surgeon: Manya Silvas, MD;  Location: [facility] ENDOSCOPY;  Service: Endoscopy;  Laterality: N/A;   COLONOSCOPY N/A 10/10/2021   Procedure: COLONOSCOPY;  Surgeon: [name], DO;  Location: [facility] ENDOSCOPY;  Service: Gastroenterology;  Laterality: N/A;   DEEP BRAIN STIMULATOR PLACEMENT     HAND SURGERY Left 2011   trauma   HERNIA REPAIR     LAPAROSCOPIC INGUINAL HERNIA WITH UMBILICAL HERNIA Right 87/86/7672   Procedure: LAPAROSCOPIC INGUINAL HERNIA REPAIR, right with umbilical herniorrhaphy;  Surgeon: Florene Glen, MD;  Location: [facility] ORS;  Service: General;  Laterality: Right;   MINOR PLACEMENT OF FIDUCIAL N/A 11/24/2017   Procedure: MINOR PLACEMENT OF FIDUCIAL;  Surgeon: Erline Levine, MD;   Location: [place];  Service: Neurosurgery;  Laterality: N/A;  Fiducial placement   PULSE GENERATOR IMPLANT Bilateral 10/23/2016   Procedure: BILATERAL IMPLANTABLE PULSE GENERATOR;  Surgeon: Erline Levine, MD;  Location: [place];  Service: Neurosurgery;  Laterality: Bilateral;  BILATERAL IMPLANTABLE PULSE GENERATOR   PULSE GENERATOR IMPLANT Left 01/12/2018   Procedure: UNILATERAL PULSE GENERATOR IMPLANT AND EXTENTIONS FOR DEEP BRAIN STIMULATOR;  Surgeon: Erline Levine, MD;  Location: [place];  Service: Neurosurgery;  Laterality: Left;   SUBTHALAMIC STIMULATOR INSERTION Bilateral 10/07/2016   Procedure: BILATERAL DEEP BRAIN STIMULATOR PLACEMENT;  Surgeon: Erline Levine, MD;  Location: [place];  Service: Neurosurgery;  Laterality: Bilateral;  BILATERAL DEEP BRAIN STIMULATOR PLACEMENT   SUBTHALAMIC STIMULATOR INSERTION Left 12/04/2017   Procedure: Left Deep Brain Stimulator Placement;  Surgeon: Erline Levine, MD;  Location: [place];  Service: Neurosurgery;  Laterality: Left;  Left deep brain stimulator placement   SUBTHALAMIC STIMULATOR REMOVAL Left 03/10/2017   Procedure: REMOVAL DEEP BRAIN STIMULATOR AND ASPIRATION OF BRAIN ABSCESS;  Surgeon: Earnie Larsson, MD;  Location: [place];  Service: Neurosurgery;  Laterality: Left;   UMBILICAL HERNIA REPAIR N/A 07/15/2017   Procedure: HERNIA REPAIR UMBILICAL ADULT;  Surgeon: Florene Glen, MD;  Location: [facility] ORS;  Service: General;  Laterality: N/A;    Family History  Problem Relation Age of Onset   Dementia Mother    Alzheimer's disease Mother    Parkinson's  disease Father    Cancer Father 13       prostate   Heart disease Father 63       CAD/CABG   Hypertension Father    Hyperlipidemia Father    Parkinsonism Father    Coronary artery disease Father    Heart attack Father    Colon cancer Father    Alzheimer's disease Sister    Diabetes Sister    Parkinson's disease Brother    Coronary artery disease Brother    Social History:  reports that he quit  smoking about 51 years ago. His smoking use included cigarettes. He has a 30.00 pack-year smoking history. He has never used smokeless tobacco. He reports current alcohol use of about 1.0 standard drink per week. He reports that he does not use drugs.  Allergies:  Allergies  Allergen Reactions   Amlodipine     Other reaction(s): Headache   Procaine Nausea And Vomiting and Other (See Comments)    NOVOCAINE   Sulfacetamide Sodium-Sulfur Rash    Medications Prior to Admission  Medication Sig Dispense Refill   Ascorbic Acid (VITAMIN C) 500 MG CAPS Take 500 mg by mouth in the morning.     Cholecalciferol (VITAMIN D3) 2000 units TABS Take 2,000 Units by mouth in the morning.     Cyanocobalamin (B-12) 3000 MCG CAPS Take 3,000 mcg by mouth in the morning.     Digestive Enzymes (PAPAYA AND ENZYMES PO) Take 1 tablet by mouth daily as needed (for heartburn/indigestion).     hydrALAZINE (APRESOLINE) 10 MG tablet Take 10 mg by mouth 3 (three) times daily.     hydrochlorothiazide (HYDRODIURIL) 25 MG tablet Take 1 tablet (25 mg total) by mouth daily. 90 tablet 3   metroNIDAZOLE (METROGEL) 1 % gel Apply 1 application topically daily as needed (rosacea).     Multiple Vitamin (MULTIVITAMIN WITH MINERALS) TABS tablet Take 1 tablet by mouth in the morning. Centrum for Men     Multiple Vitamins-Minerals (AIRBORNE) CHEW Chew 1 tablet by mouth in the morning.     Omega-3 Fatty Acids (OMEGA-3 EPA FISH OIL PO) Take 2 capsules by mouth in the morning.     Polyvinyl Alcohol-Povidone PF (REFRESH) 1.4-0.6 % SOLN Place 1-2 drops into both eyes 3 (three) times daily as needed (dry/irritated eyes.).     research study medication Take 1 each by mouth daily. Atorvastatin Calcium (INVESTIGATIONAL ATORVASTATIN/PLACEBO) 40 MG-PRagmatic EValuation of evENTs And Benefits of Lipid-lowering in oldEr adults (All Site sIRB) by [facility]     sodium chloride (OCEAN) 0.65 % nasal spray Place 1 spray into the nose at bedtime.      Tetrahydroz-Glyc-Hyprom-PEG (VISINE MAXIMUM REDNESS RELIEF OP) Place 1 drop into both eyes daily as needed (for eye irritation).     valsartan (DIOVAN) 320 MG tablet Take 320 mg by mouth in the morning.     aspirin EC 81 MG tablet Take 81 mg by mouth daily. Swallow whole.      Results for orders placed or performed during the hospital encounter of 10/30/21 (from the past 48 hour(s))  CBC per protocol     Status: None   Collection Time: 10/30/21  6:03 AM  Result Value Ref Range   WBC 8.6 4.0 - 10.5 K/uL   RBC 4.75 4.22 - 5.81 MIL/uL   Hemoglobin 15.2 13.0 - 17.0 g/dL   HCT 43.4 39.0 - 52.0 %   MCV 91.4 80.0 - 100.0 fL   MCH 32.0 26.0 - 34.0 pg  MCHC 35.0 30.0 - 36.0 g/dL   RDW 13.0 11.5 - 15.5 %   Platelets 264 150 - 400 K/uL   nRBC 0.0 0.0 - 0.2 %    Comment: Performed at [facility], [place] [address] [place], [place] [address]  Basic metabolic panel per protocol     Status: Abnormal   Collection Time: 10/30/21  6:03 AM  Result Value Ref Range   Sodium 138 135 - 145 mmol/L   Potassium 3.7 3.5 - 5.1 mmol/L   Chloride 101 98 - 111 mmol/L   CO2 25 22 - 32 mmol/L   Glucose, Bld 104 (H) 70 - 99 mg/dL    Comment: Glucose reference range applies only to samples taken after fasting for at least 8 hours.   BUN 18 8 - 23 mg/dL   Creatinine, Ser 1.14 0.61 - 1.24 mg/dL   Calcium 9.2 8.9 - 10.3 mg/dL   GFR, Estimated >60 >60 mL/min    Comment: (NOTE) Calculated using the CKD-EPI Creatinine Equation (2021)    Anion gap 12 5 - 15    Comment: Performed at [facility] [address], [place], [place] [address]   No results found.  ROS All pertinent positives and negatives are listed in HPI above  Blood pressure (!) 168/61, pulse 63, temperature 97.9 F (36.6 C), temperature source Oral, resp. rate 18, height 5' 9"  (1.753 m), weight 85.3 kg, SpO2 98 %. Physical Exam  Awake alert oriented x3, no acute distress PERRLA EOMI Cranial nerves II through XII intact Speech  fluent and appropriate Moves all extremities equally and symmetric  Assessment/Plan 76 year old male with  Essential tremor, status post DBS placement, end of right chest IPG life  -OR today for right chest IPG replacement.  We discussed all risks, benefits and expected outcomes as well as alternatives to treatment.  Answered all of his questions.  Informed consent was obtained.  Thank you for allowing me to participate in this patient's care.  Please do not hesitate to call with questions or concerns.   Elwin Sleight, [facility] Cell: [phone]

## 2021-10-30 NOTE — Progress Notes (Signed)
Unfortunately we are having to cancel his surgery due to availability of the representative and their equipment.  I sincerely apologize to the patient and his wife, we will work on rescheduling him as soon as possible.   Thank you for allowing me to participate in this patient's care.  Please do not hesitate to call with questions or concerns.   Elwin Sleight, [facility] Cell: [phone]

## 2021-10-30 NOTE — Progress Notes (Signed)
Pt updated on the new surgery date 10/31/20, time 89:37J-74:96M, arrival 08:35a. Nothing to eat or drink after midnight. Additional information can be found in the shadow chart.

## 2021-10-31 ENCOUNTER — Encounter (HOSPITAL_COMMUNITY)
Admission: RE | Disposition: A | Discharge status: Home / Self Care | Payer: Self-pay | Source: Home / Self Care | Attending: Neurological Surgery

## 2021-10-31 ENCOUNTER — Other Ambulatory Visit: Payer: Self-pay

## 2021-10-31 ENCOUNTER — Ambulatory Visit (HOSPITAL_COMMUNITY): Payer: Medicare HMO | Admitting: Certified Registered"

## 2021-10-31 ENCOUNTER — Ambulatory Visit (HOSPITAL_COMMUNITY)
Admission: RE | Admit: 2021-10-31 | Discharge: 2021-10-31 | Disposition: A | Discharge status: Home / Self Care | Payer: Medicare HMO | Attending: Neurological Surgery | Admitting: Neurological Surgery

## 2021-10-31 ENCOUNTER — Encounter (HOSPITAL_COMMUNITY): Payer: Self-pay | Admitting: Neurological Surgery

## 2021-10-31 ENCOUNTER — Ambulatory Visit (HOSPITAL_BASED_OUTPATIENT_CLINIC_OR_DEPARTMENT_OTHER): Payer: Medicare HMO | Admitting: Certified Registered"

## 2021-10-31 DIAGNOSIS — G4730 Sleep apnea, unspecified: Secondary | ICD-10-CM | POA: Diagnosis not present

## 2021-10-31 DIAGNOSIS — Z4542 Encounter for adjustment and management of neuropacemaker (brain) (peripheral nerve) (spinal cord): Secondary | ICD-10-CM | POA: Insufficient documentation

## 2021-10-31 DIAGNOSIS — K219 Gastro-esophageal reflux disease without esophagitis: Secondary | ICD-10-CM | POA: Diagnosis not present

## 2021-10-31 DIAGNOSIS — I10 Essential (primary) hypertension: Secondary | ICD-10-CM

## 2021-10-31 DIAGNOSIS — Z87891 Personal history of nicotine dependence: Secondary | ICD-10-CM | POA: Insufficient documentation

## 2021-10-31 DIAGNOSIS — N289 Disorder of kidney and ureter, unspecified: Secondary | ICD-10-CM | POA: Diagnosis not present

## 2021-10-31 DIAGNOSIS — G250 Essential tremor: Secondary | ICD-10-CM | POA: Insufficient documentation

## 2021-10-31 HISTORY — PX: PULSE GENERATOR IMPLANT: SHX5370

## 2021-10-31 SURGERY — UNILATERAL PULSE GENERATOR IMPLANT
Anesthesia: General | Site: Chest | Laterality: Right

## 2021-10-31 MED ORDER — AMISULPRIDE (ANTIEMETIC) 5 MG/2ML IV SOLN: 10.0000 mg | Freq: Once | INTRAVENOUS | Status: DC | PRN

## 2021-10-31 MED ORDER — BUPIVACAINE HCL (PF) 0.5 % IJ SOLN
INTRAMUSCULAR | Status: AC
  Filled 2021-10-31: qty 30

## 2021-10-31 MED ORDER — OXYCODONE HCL 5 MG/5ML PO SOLN: 5.0000 mg | Freq: Once | ORAL | Status: DC | PRN

## 2021-10-31 MED ORDER — ACETAMINOPHEN 10 MG/ML IV SOLN: 1000.0000 mg | Freq: Once | INTRAVENOUS | Status: DC | PRN

## 2021-10-31 MED ORDER — PROPOFOL 10 MG/ML IV BOLUS
INTRAVENOUS | Status: AC
  Filled 2021-10-31: qty 20

## 2021-10-31 MED ORDER — LIDOCAINE 2% (20 MG/ML) 5 ML SYRINGE
INTRAMUSCULAR | Status: DC | PRN
  Administered 2021-10-31: 60 mg via INTRAVENOUS

## 2021-10-31 MED ORDER — ASPIRIN EC 81 MG PO TBEC: 81.0000 mg | DELAYED_RELEASE_TABLET | Freq: Every day | ORAL | 11 refills | Status: DC

## 2021-10-31 MED ORDER — BACITRACIN ZINC 500 UNIT/GM EX OINT
TOPICAL_OINTMENT | CUTANEOUS | Status: AC
  Filled 2021-10-31: qty 28.35

## 2021-10-31 MED ORDER — ACETAMINOPHEN 325 MG PO TABS: 325.0000 mg | ORAL_TABLET | ORAL | Status: DC | PRN

## 2021-10-31 MED ORDER — CHLORHEXIDINE GLUCONATE 0.12 % MT SOLN: 15.0000 mL | Freq: Once | OROMUCOSAL | Status: AC

## 2021-10-31 MED ORDER — ONDANSETRON HCL 4 MG/2ML IJ SOLN: 4.0000 mg | Freq: Once | INTRAMUSCULAR | Status: DC | PRN

## 2021-10-31 MED ORDER — ORAL CARE MOUTH RINSE: 15.0000 mL | Freq: Once | OROMUCOSAL | Status: AC

## 2021-10-31 MED ORDER — FENTANYL CITRATE (PF) 100 MCG/2ML IJ SOLN: 25.0000 ug | INTRAMUSCULAR | Status: DC | PRN

## 2021-10-31 MED ORDER — LIDOCAINE-EPINEPHRINE 1 %-1:100000 IJ SOLN
INTRAMUSCULAR | Status: AC
  Filled 2021-10-31: qty 1

## 2021-10-31 MED ORDER — PROPOFOL 10 MG/ML IV BOLUS
INTRAVENOUS | Status: DC | PRN
  Administered 2021-10-31: 160 mg via INTRAVENOUS

## 2021-10-31 MED ORDER — BUPIVACAINE HCL (PF) 0.5 % IJ SOLN
INTRAMUSCULAR | Status: DC | PRN
Start: 2021-10-31 — End: 2021-10-31
  Administered 2021-10-31: 4 mL

## 2021-10-31 MED ORDER — LIDOCAINE-EPINEPHRINE 1 %-1:100000 IJ SOLN
INTRAMUSCULAR | Status: DC | PRN
  Administered 2021-10-31: 4 mL

## 2021-10-31 MED ORDER — AMISULPRIDE (ANTIEMETIC) 5 MG/2ML IV SOLN
10.0000 mg | Freq: Once | INTRAVENOUS | Status: DC | PRN
Start: 2021-10-31 — End: 2021-11-01

## 2021-10-31 MED ORDER — OXYCODONE HCL 5 MG PO TABS: 5.0000 mg | ORAL_TABLET | Freq: Once | ORAL | Status: DC | PRN

## 2021-10-31 MED ORDER — 0.9 % SODIUM CHLORIDE (POUR BTL) OPTIME
TOPICAL | Status: DC | PRN
  Administered 2021-10-31: 1000 mL

## 2021-10-31 MED ORDER — LACTATED RINGERS IV SOLN: INTRAVENOUS | Status: DC

## 2021-10-31 MED ORDER — FENTANYL CITRATE (PF) 100 MCG/2ML IJ SOLN
INTRAMUSCULAR | Status: DC | PRN
  Administered 2021-10-31: 50 ug via INTRAVENOUS
  Administered 2021-10-31 (×2): 25 ug via INTRAVENOUS

## 2021-10-31 MED ORDER — ACETAMINOPHEN 160 MG/5ML PO SOLN: 325.0000 mg | ORAL | Status: DC | PRN

## 2021-10-31 MED ORDER — VANCOMYCIN HCL 1000 MG IV SOLR
INTRAVENOUS | Status: DC | PRN
  Administered 2021-10-31: 1000 mg

## 2021-10-31 MED ORDER — EPHEDRINE SULFATE (PRESSORS) 50 MG/ML IJ SOLN
INTRAMUSCULAR | Status: DC | PRN
  Administered 2021-10-31: 5 mg via INTRAVENOUS

## 2021-10-31 MED ORDER — CEFAZOLIN SODIUM-DEXTROSE 2-4 GM/100ML-% IV SOLN
INTRAVENOUS | Status: AC
  Filled 2021-10-31: qty 100

## 2021-10-31 MED ORDER — PROMETHAZINE HCL 25 MG/ML IJ SOLN: 6.2500 mg | INTRAMUSCULAR | Status: DC | PRN

## 2021-10-31 MED ORDER — VANCOMYCIN HCL 1000 MG IV SOLR
INTRAVENOUS | Status: AC
  Filled 2021-10-31: qty 20

## 2021-10-31 MED ORDER — FENTANYL CITRATE (PF) 250 MCG/5ML IJ SOLN
INTRAMUSCULAR | Status: AC
  Filled 2021-10-31: qty 5

## 2021-10-31 MED ORDER — DEXAMETHASONE SODIUM PHOSPHATE 4 MG/ML IJ SOLN
INTRAMUSCULAR | Status: DC | PRN
  Administered 2021-10-31: 5 mg via INTRAVENOUS

## 2021-10-31 MED ORDER — ONDANSETRON HCL 4 MG/2ML IJ SOLN
INTRAMUSCULAR | Status: DC | PRN
Start: 2021-10-31 — End: 2021-10-31
  Administered 2021-10-31: 4 mg via INTRAVENOUS

## 2021-10-31 MED ORDER — CHLORHEXIDINE GLUCONATE 0.12 % MT SOLN
OROMUCOSAL | Status: AC
  Administered 2021-10-31: 15 mL via OROMUCOSAL
  Filled 2021-10-31: qty 15

## 2021-10-31 SURGICAL SUPPLY — 46 items
ADH SKN CLS APL DERMABOND .7 (GAUZE/BANDAGES/DRESSINGS) ×1
BAG COUNTER SPONGE SURGICOUNT (BAG) ×2 IMPLANT
BAG SPNG CNTER NS LX DISP (BAG) ×1
BLADE CLIPPER SURG (BLADE) IMPLANT
BNDG ADH 1X3 SHEER STRL LF (GAUZE/BANDAGES/DRESSINGS) IMPLANT
BNDG ADH THN 3X1 STRL LF (GAUZE/BANDAGES/DRESSINGS)
BOOT SUTURE AID YELLOW STND (SUTURE) IMPLANT
CANISTER SUCT 3000ML PPV (MISCELLANEOUS) ×2 IMPLANT
CLIP RANEY DISP (INSTRUMENTS) IMPLANT
COVER BACK TABLE 60X90IN (DRAPES) ×2 IMPLANT
DECANTER SPIKE VIAL GLASS SM (MISCELLANEOUS) ×1 IMPLANT
DERMABOND ADVANCED (GAUZE/BANDAGES/DRESSINGS) ×1
DERMABOND ADVANCED .7 DNX12 (GAUZE/BANDAGES/DRESSINGS) ×1 IMPLANT
DRAPE C-ARM 42X72 X-RAY (DRAPES) IMPLANT
DRAPE CAMERA CLOSED 9X96 (DRAPES) ×2 IMPLANT
DRAPE ORTHO SPLIT 77X108 STRL (DRAPES) ×4
DRAPE SURG ORHT 6 SPLT 77X108 (DRAPES) ×2 IMPLANT
DRSG OPSITE POSTOP 4X6 (GAUZE/BANDAGES/DRESSINGS) ×1 IMPLANT
DRSG TELFA 3X8 NADH (GAUZE/BANDAGES/DRESSINGS) ×2 IMPLANT
DURAPREP 26ML APPLICATOR (WOUND CARE) ×2 IMPLANT
GAUZE 4X4 16PLY ~~LOC~~+RFID DBL (SPONGE) IMPLANT
GAUZE SPONGE 4X4 12PLY STRL (GAUZE/BANDAGES/DRESSINGS) IMPLANT
GLOVE SRG 8 PF TXTR STRL LF DI (GLOVE) ×1 IMPLANT
GLOVE SURG ENC MOIS LTX SZ8 (GLOVE) IMPLANT
GLOVE SURG LTX SZ8 (GLOVE) ×4 IMPLANT
GLOVE SURG UNDER LTX SZ8.5 (GLOVE) IMPLANT
GLOVE SURG UNDER POLY LF SZ7.5 (GLOVE) ×6 IMPLANT
GLOVE SURG UNDER POLY LF SZ8 (GLOVE) ×2
KIT BASIN OR (CUSTOM PROCEDURE TRAY) ×2 IMPLANT
KIT REMOVER STAPLE SKIN (MISCELLANEOUS) ×2 IMPLANT
MARKER SKIN DUAL TIP RULER LAB (MISCELLANEOUS) ×2 IMPLANT
NDL HYPO 25X1 1.5 SAFETY (NEEDLE) ×1 IMPLANT
NDL SPNL 18GX3.5 QUINCKE PK (NEEDLE) IMPLANT
NEEDLE HYPO 25X1 1.5 SAFETY (NEEDLE) ×2 IMPLANT
NEEDLE SPNL 18GX3.5 QUINCKE PK (NEEDLE) IMPLANT
NEUROSTIM OCTOPOLAR ~~LOC~~ 60X55 (Neuro Prosthesis/Implant) ×1 IMPLANT
PACK LAMINECTOMY NEURO (CUSTOM PROCEDURE TRAY) ×2 IMPLANT
PAD ARMBOARD 7.5X6 YLW CONV (MISCELLANEOUS) ×4 IMPLANT
PAD DRESSING TELFA 3X8 NADH (GAUZE/BANDAGES/DRESSINGS) ×1 IMPLANT
PASSER CATH 36 CODMAN DISP (NEUROSURGERY SUPPLIES) IMPLANT
STAPLER VISISTAT 35W (STAPLE) ×2 IMPLANT
SUT ETHILON 3 0 PS 1 (SUTURE) IMPLANT
SUT SILK 2 0 PERMA HAND 18 BK (SUTURE) ×2 IMPLANT
SUT VIC AB 2-0 CP2 18 (SUTURE) ×2 IMPLANT
SUT VIC AB 2-0 CT2 18 VCP726D (SUTURE) ×4 IMPLANT
SUT VIC AB 3-0 SH 8-18 (SUTURE) ×3 IMPLANT

## 2021-10-31 NOTE — Anesthesia Procedure Notes (Signed)
Procedure Name: LMA Insertion Date/Time: 10/31/2021 10:45 AM Performed by: Amadeo Garnet, CRNA Pre-anesthesia Checklist: Patient identified, Emergency Drugs available, Suction available and Patient being monitored Patient Re-evaluated:Patient Re-evaluated prior to induction Oxygen Delivery Method: Circle system utilized Preoxygenation: Pre-oxygenation with 100% oxygen Induction Type: IV induction LMA: LMA inserted LMA Size: 5.0 Placement Confirmation: positive ETCO2 Tube secured with: Tape Dental Injury: Teeth and Oropharynx as per pre-operative assessment

## 2021-10-31 NOTE — Discharge Instructions (Signed)
Okay to shower in 3 days.  Remove bandage, allow soap and water to run over the wound.  Pat dry.  Leave open to air.  Inspect daily for signs of infection (redness, swelling, drainage).  Do not submerge the wound in a bathtub or pool until seen in the office in 2 weeks.

## 2021-10-31 NOTE — Transfer of Care (Signed)
Immediate Anesthesia Transfer of Care Note  Patient: Carlos Hunt  Procedure(s) Performed: Change Implantable Pulse Generator battery (Right: Chest)  Patient Location: PACU  Anesthesia Type:General  Level of Consciousness: awake, alert  and oriented  Airway & Oxygen Therapy: Patient Spontanous Breathing and Patient connected to face mask oxygen  Post-op Assessment: Report given to RN, Post -op Vital signs reviewed and stable and Patient moving all extremities  Post vital signs: Reviewed and stable  Last Vitals:  Vitals Value Taken Time  BP 145/70 10/31/21 1145  Temp    Pulse 74 10/31/21 1148  Resp 17 10/31/21 1148  SpO2 100 % 10/31/21 1148  Vitals shown include unvalidated device data.  Last Pain:  Vitals:   10/31/21 0912  TempSrc:   PainSc: 0-No pain         Complications: No notable events documented.

## 2021-10-31 NOTE — Op Note (Signed)
° °  Providing Compassionate, Quality Care - Together  Date of service: 10/31/2021  PREOP DIAGNOSIS:  Essential tremor, status post DBS placement End of IPG battery life, right chest  POSTOP DIAGNOSIS: Same  PROCEDURE: Replacement of right chest IPG battery due to end-of-life (Medtronic Activa [place]: NLB [id number])  SURGEON: Dr. Pieter Partridge C. Nancy Manuele, DO  ASSISTANT: None  ANESTHESIA: General Endotracheal  EBL: Minimal  SPECIMENS: None  DRAINS: None  COMPLICATIONS: None  CONDITION: Hemodynamically stable  HISTORY: Carlos Hunt is a 76 y.o. male with a history of bilateral DBS for essential tremor.  He presented with end of battery life in his right IPG.  We discussed all risks, benefits and expected outcomes as well as alternatives to treatment for battery replacement.  Informed consent was obtained.  I answered all of his questions.  PROCEDURE IN DETAIL: The patient was brought to the operating room. After induction of general anesthesia, the patient was positioned on the operative table in the supine position. All pressure points were meticulously padded. Skin incision was then marked out and prepped and draped in the usual sterile fashion.  Physician driven timeout was performed.  Local anesthetic was injected into the planned incision.  Using a 15 blade, previous incision was opened sharply down to the battery pocket.  Using Bovie electrocautery, the battery was freed from its adhesions and this was externalized from the pocket carefully to protect the wire.  The wire was removed cleaned and replaced in the new battery.  Impedances were checked.  They were minimal and contacts were all appropriate and working.  The new IPG was then placed in the pocket and hemostasis was achieved with bipolar cautery.  Vancomycin powder was placed in the wound.  The pocket layer was then closed with 2-0 Vicryl sutures.  Dermis was then closed with 2-0 and 3-0 Vicryl sutures.  Skin was closed with Dermabond.   Sterile dressing applied.  At the end of the case all sponge, needle, and instrument counts were correct. The patient was then transferred to the stretcher, extubated, and taken to the post-anesthesia care unit in stable hemodynamic condition.

## 2021-10-31 NOTE — H&P (Signed)
Providing Compassionate, Quality Care - Together  NEUROSURGERY HISTORY & PHYSICAL   Carlos Hunt is an 76 y.o. male.   Chief Complaint: End-of-life IPG HPI: This is a 76 year old male with a history of essential tremor, status post DBS placement for treatment of this.  He presents with end-of-life battery for his right chest IPG.  He is interested in a Air traffic controller.  Past Medical History:  Diagnosis Date   Adenomatous polyps 04/02/2017   Brain abscess 03/11/2017   Elevated liver enzymes    Erectile dysfunction    Essential hypertension, benign 02/14/2013   Essential tremor    GERD (gastroesophageal reflux disease)    Head injury    Hx of colonic polyps    Hyperlipidemia    Hypertension    New onset atrial flutter (HCC)    Pain in left knee    Pain, joint, shoulder    PONV (postoperative nausea and vomiting)    nausea no vomiting   Seasonal allergies    Sleep apnea    Testicular hypofunction    Tobacco use disorder    Tremor    Unspecified vitamin D deficiency     Past Surgical History:  Procedure Laterality Date   COLONOSCOPY  09/09/2011   normal.  Previous polyps.  [name]. Repeat 5 years.   COLONOSCOPY N/A 07/18/2016   Procedure: COLONOSCOPY;  Surgeon: Manya Silvas, MD;  Location: [facility] ENDOSCOPY;  Service: Endoscopy;  Laterality: N/A;   COLONOSCOPY N/A 10/10/2021   Procedure: COLONOSCOPY;  Surgeon: [name], DO;  Location: [facility] ENDOSCOPY;  Service: Gastroenterology;  Laterality: N/A;   DEEP BRAIN STIMULATOR PLACEMENT     HAND SURGERY Left 2011   trauma   HERNIA REPAIR     LAPAROSCOPIC INGUINAL HERNIA WITH UMBILICAL HERNIA Right 17/51/0258   Procedure: LAPAROSCOPIC INGUINAL HERNIA REPAIR, right with umbilical herniorrhaphy;  Surgeon: Florene Glen, MD;  Location: [facility] ORS;  Service: General;  Laterality: Right;   MINOR PLACEMENT OF FIDUCIAL N/A 11/24/2017   Procedure: MINOR PLACEMENT OF FIDUCIAL;  Surgeon: Erline Levine, MD;   Location: [place];  Service: Neurosurgery;  Laterality: N/A;  Fiducial placement   PULSE GENERATOR IMPLANT Bilateral 10/23/2016   Procedure: BILATERAL IMPLANTABLE PULSE GENERATOR;  Surgeon: Erline Levine, MD;  Location: [place];  Service: Neurosurgery;  Laterality: Bilateral;  BILATERAL IMPLANTABLE PULSE GENERATOR   PULSE GENERATOR IMPLANT Left 01/12/2018   Procedure: UNILATERAL PULSE GENERATOR IMPLANT AND EXTENTIONS FOR DEEP BRAIN STIMULATOR;  Surgeon: Erline Levine, MD;  Location: [place];  Service: Neurosurgery;  Laterality: Left;   SUBTHALAMIC STIMULATOR INSERTION Bilateral 10/07/2016   Procedure: BILATERAL DEEP BRAIN STIMULATOR PLACEMENT;  Surgeon: Erline Levine, MD;  Location: [place];  Service: Neurosurgery;  Laterality: Bilateral;  BILATERAL DEEP BRAIN STIMULATOR PLACEMENT   SUBTHALAMIC STIMULATOR INSERTION Left 12/04/2017   Procedure: Left Deep Brain Stimulator Placement;  Surgeon: Erline Levine, MD;  Location: [place];  Service: Neurosurgery;  Laterality: Left;  Left deep brain stimulator placement   SUBTHALAMIC STIMULATOR REMOVAL Left 03/10/2017   Procedure: REMOVAL DEEP BRAIN STIMULATOR AND ASPIRATION OF BRAIN ABSCESS;  Surgeon: Earnie Larsson, MD;  Location: [place];  Service: Neurosurgery;  Laterality: Left;   UMBILICAL HERNIA REPAIR N/A 07/15/2017   Procedure: HERNIA REPAIR UMBILICAL ADULT;  Surgeon: Florene Glen, MD;  Location: [facility] ORS;  Service: General;  Laterality: N/A;    Family History  Problem Relation Age of Onset   Dementia Mother    Alzheimer's disease Mother    Parkinson's  disease Father    Cancer Father 37       prostate   Heart disease Father 47       CAD/CABG   Hypertension Father    Hyperlipidemia Father    Parkinsonism Father    Coronary artery disease Father    Heart attack Father    Colon cancer Father    Alzheimer's disease Sister    Diabetes Sister    Parkinson's disease Brother    Coronary artery disease Brother    Social History:  reports that he quit  smoking about 51 years ago. His smoking use included cigarettes. He has a 30.00 pack-year smoking history. He has never used smokeless tobacco. He reports current alcohol use of about 1.0 standard drink per week. He reports that he does not use drugs.  Allergies:  Allergies  Allergen Reactions   Amlodipine     Other reaction(s): Headache   Procaine Nausea And Vomiting and Other (See Comments)    NOVOCAINE   Sulfacetamide Sodium-Sulfur Rash    Medications Prior to Admission  Medication Sig Dispense Refill   Ascorbic Acid (VITAMIN C) 500 MG CAPS Take 500 mg by mouth in the morning.     Cholecalciferol (VITAMIN D3) 2000 units TABS Take 2,000 Units by mouth in the morning.     Cyanocobalamin (B-12) 3000 MCG CAPS Take 3,000 mcg by mouth in the morning.     Digestive Enzymes (PAPAYA AND ENZYMES PO) Take 1 tablet by mouth daily as needed (for heartburn/indigestion).     hydrALAZINE (APRESOLINE) 10 MG tablet Take 10 mg by mouth 3 (three) times daily.     hydrochlorothiazide (HYDRODIURIL) 25 MG tablet Take 1 tablet (25 mg total) by mouth daily. 90 tablet 3   metroNIDAZOLE (METROGEL) 1 % gel Apply 1 application topically daily as needed (rosacea).     Multiple Vitamin (MULTIVITAMIN WITH MINERALS) TABS tablet Take 1 tablet by mouth in the morning. Centrum for Men     Multiple Vitamins-Minerals (AIRBORNE) CHEW Chew 1 tablet by mouth in the morning.     Omega-3 Fatty Acids (OMEGA-3 EPA FISH OIL PO) Take 2 capsules by mouth in the morning.     Polyvinyl Alcohol-Povidone PF (REFRESH) 1.4-0.6 % SOLN Place 1-2 drops into both eyes 3 (three) times daily as needed (dry/irritated eyes.).     research study medication Take 1 each by mouth daily. Atorvastatin Calcium (INVESTIGATIONAL ATORVASTATIN/PLACEBO) 40 MG-PRagmatic EValuation of evENTs And Benefits of Lipid-lowering in oldEr adults (All Site sIRB) by [facility]     sodium chloride (OCEAN) 0.65 % nasal spray Place 1 spray into the nose at bedtime.      Tetrahydroz-Glyc-Hyprom-PEG (VISINE MAXIMUM REDNESS RELIEF OP) Place 1 drop into both eyes daily as needed (for eye irritation).     valsartan (DIOVAN) 320 MG tablet Take 320 mg by mouth in the morning.     aspirin EC 81 MG tablet Take 81 mg by mouth daily. Swallow whole.      Results for orders placed or performed during the hospital encounter of 10/30/21 (from the past 48 hour(s))  CBC per protocol     Status: None   Collection Time: 10/30/21  6:03 AM  Result Value Ref Range   WBC 8.6 4.0 - 10.5 K/uL   RBC 4.75 4.22 - 5.81 MIL/uL   Hemoglobin 15.2 13.0 - 17.0 g/dL   HCT 43.4 39.0 - 52.0 %   MCV 91.4 80.0 - 100.0 fL   MCH 32.0 26.0 - 34.0 pg  MCHC 35.0 30.0 - 36.0 g/dL   RDW 13.0 11.5 - 15.5 %   Platelets 264 150 - 400 K/uL   nRBC 0.0 0.0 - 0.2 %    Comment: Performed at [facility], [place] [address] [place], [place] [address]  Basic metabolic panel per protocol     Status: Abnormal   Collection Time: 10/30/21  6:03 AM  Result Value Ref Range   Sodium 138 135 - 145 mmol/L   Potassium 3.7 3.5 - 5.1 mmol/L   Chloride 101 98 - 111 mmol/L   CO2 25 22 - 32 mmol/L   Glucose, Bld 104 (H) 70 - 99 mg/dL    Comment: Glucose reference range applies only to samples taken after fasting for at least 8 hours.   BUN 18 8 - 23 mg/dL   Creatinine, Ser 1.14 0.61 - 1.24 mg/dL   Calcium 9.2 8.9 - 10.3 mg/dL   GFR, Estimated >60 >60 mL/min    Comment: (NOTE) Calculated using the CKD-EPI Creatinine Equation (2021)    Anion gap 12 5 - 15    Comment: Performed at [facility] [address] [place], [place] [address]   No results found.  ROS All positives and negatives are in HPI that are pertinent  Blood pressure (!) 147/63, pulse (!) 59, temperature 97.9 F (36.6 C), temperature source Oral, resp. rate 17, height 5' 9"  (1.753 m), weight 85.3 kg, SpO2 98 %. Physical Exam  Awake alert oriented x3, no acute distress PERRLA EOMI Cranial nerves II through XII intact Speech  fluent and appropriate Moves all extremities equally and symmetric  Assessment/Plan 76 year old male with   Essential tremor, status post DBS placement, end of right chest IPG life   -OR today for right chest IPG replacement.  We discussed all risks, benefits and expected outcomes as well as alternatives to treatment.  Answered all of his questions.  Informed consent was obtained.   Thank you for allowing me to participate in this patient's care.  Please do not hesitate to call with questions or concerns.     Elwin Sleight, [facility] Cell: [phone]

## 2021-10-31 NOTE — Anesthesia Preprocedure Evaluation (Addendum)
Anesthesia Evaluation  Patient identified by MRN, date of birth, ID band Patient awake    Reviewed: Allergy & Precautions, NPO status , Patient's Chart, lab work & pertinent test results  History of Anesthesia Complications (+) PONV and history of anesthetic complications  Airway Mallampati: II  TM Distance: >3 FB Neck ROM: Full    Dental  (+) Dental Advisory Given, Teeth Intact, Chipped,    Pulmonary sleep apnea , former smoker,    breath sounds clear to auscultation       Cardiovascular hypertension, Pt. on medications  Rhythm:Regular Rate:Normal     Neuro/Psych negative neurological ROS  negative psych ROS   GI/Hepatic Neg liver ROS, GERD  ,  Endo/Other  negative endocrine ROS  Renal/GU Renal disease     Musculoskeletal negative musculoskeletal ROS (+)   Abdominal Normal abdominal exam  (+)   Peds  Hematology negative hematology ROS (+)   Anesthesia Other Findings   Reproductive/Obstetrics                            Anesthesia Physical Anesthesia Plan  ASA: 3  Anesthesia Plan: General   Post-op Pain Management:    Induction: Intravenous  PONV Risk Score and Plan: 4 or greater and Ondansetron, Treatment may vary due to age or medical condition and Dexamethasone  Airway Management Planned: LMA  Additional Equipment: None  Intra-op Plan:   Post-operative Plan: Extubation in OR  Informed Consent: I have reviewed the patients History and Physical, chart, labs and discussed the procedure including the risks, benefits and alternatives for the proposed anesthesia with the patient or authorized representative who has indicated his/her understanding and acceptance.     Dental advisory given  Plan Discussed with: CRNA  Anesthesia Plan Comments:        Anesthesia Quick Evaluation

## 2021-10-31 NOTE — Anesthesia Postprocedure Evaluation (Signed)
Anesthesia Post Note  Patient: Carlos Hunt  Procedure(s) Performed: Change Implantable Pulse Generator battery (Right: Chest)     Patient location during evaluation: PACU Anesthesia Type: General Level of consciousness: awake and alert Pain management: pain level controlled Vital Signs Assessment: post-procedure vital signs reviewed and stable Respiratory status: spontaneous breathing, nonlabored ventilation, respiratory function stable and patient connected to nasal cannula oxygen Cardiovascular status: blood pressure returned to baseline and stable Postop Assessment: no apparent nausea or vomiting Anesthetic complications: no   No notable events documented.  Last Vitals:  Vitals:   10/31/21 1215 10/31/21 1230  BP: 136/68 (!) 146/67  Pulse: 60 66  Resp: 13 16  Temp:  36.5 C  SpO2: 97% 95%    Last Pain:  Vitals:   10/31/21 1215  TempSrc:   PainSc: 0-No pain                 Effie Berkshire

## 2021-11-01 ENCOUNTER — Encounter (HOSPITAL_COMMUNITY): Payer: Self-pay | Admitting: Neurological Surgery

## 2021-11-13 ENCOUNTER — Ambulatory Visit: Payer: Medicare HMO | Admitting: Neurology

## 2021-12-25 NOTE — Progress Notes (Signed)
? ? ?Assessment/Plan:  ? ? ?1.  Essential Tremor ? -Status post bilateral VIM DBS in January, 2018.  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  Patient subsequently had a left VIM DBS on December 04, 2017 with IPG placement on Jan 12, 2018.  His right IPG was changed out October 31, 2021.  I increased his stimulation on the right, since I had decreased it previously to conserve the battery. ? -emailed medtronic rep to try to see if we could get pt some gift/compensation for his kindness given that he had to reschedule his surgery because the Medtr[employer] rep did not show up. ? ?2.  Atrial flutter with rapid ventricular response ? -  Off metoprolol after successful flutter ablation, as well as bradycardia due to metoprolol.  Off of Eliquis and on aspirin alone.  Following with cardiology at [facility] ?3.  Follow-up in 9 months.   ? ?Subjective:  ? ?Carlos Hunt was seen today in follow up for essential tremor.  Records reviewed.  Patient's right IPG was changed out by Dr. Reatha Armour February 23.  Notes reviewed.  Patient does state that he went to the OR the day before and the wrap did not show up, so the surgery had to be rescheduled.  Patient was understanding.  He states that the battery site has healed well.  Before the battery change, we did slightly decrease the stimulation on the right side, just to try and conserve that battery.  He noticed a little bit of tremor after we did that. ? ?Current prescribed movement disorder medications: ?n/a ? ? ?ALLERGIES:   ?Allergies  ?Allergen Reactions  ? Amlodipine   ?  Other reaction(s): Headache  ? Procaine Nausea And Vomiting and Other (See Comments)  ?  NOVOCAINE  ? Sulfacetamide Sodium-Sulfur Rash  ? ? ?CURRENT MEDICATIONS:  ?Outpatient Encounter Medications as of 12/30/2021  ?Medication Sig  ? Ascorbic Acid (VITAMIN C) 500 MG CAPS Take 500 mg by mouth in the morning.  ? aspirin EC 81 MG tablet Take 1 tablet (81 mg total) by mouth  daily. Swallow whole.  ? Cholecalciferol (VITAMIN D3) 2000 units TABS Take 2,000 Units by mouth in the morning.  ? Cyanocobalamin (B-12) 3000 MCG CAPS Take 3,000 mcg by mouth in the morning.  ? Digestive Enzymes (PAPAYA AND ENZYMES PO) Take 1 tablet by mouth daily as needed (for heartburn/indigestion).  ? hydrALAZINE (APRESOLINE) 10 MG tablet Take 10 mg by mouth 3 (three) times daily.  ? hydrochlorothiazide (HYDRODIURIL) 25 MG tablet Take 1 tablet (25 mg total) by mouth daily.  ? metroNIDAZOLE (METROGEL) 1 % gel Apply 1 application topically daily as needed (rosacea).  ? Multiple Vitamin (MULTIVITAMIN WITH MINERALS) TABS tablet Take 1 tablet by mouth in the morning. Centrum for Men  ? Multiple Vitamins-Minerals (AIRBORNE) CHEW Chew 1 tablet by mouth in the morning.  ? Omega-3 Fatty Acids (OMEGA-3 EPA FISH OIL PO) Take 2 capsules by mouth in the morning.  ? Polyvinyl Alcohol-Povidone PF (REFRESH) 1.4-0.6 % SOLN Place 1-2 drops into both eyes 3 (three) times daily as needed (dry/irritated eyes.).  ? research study medication Take 1 each by mouth daily. Atorvastatin Calcium (INVESTIGATIONAL ATORVASTATIN/PLACEBO) 40 MG-PRagmatic EValuation of evENTs And Benefits of Lipid-lowering in oldEr adults (All Site sIRB) by [facility]  ? sodium chloride (OCEAN) 0.65 % nasal spray Place 1 spray into the nose at bedtime.  ? Tetrahydroz-Glyc-Hyprom-PEG (VISINE MAXIMUM REDNESS RELIEF OP) Place 1  drop into both eyes daily as needed (for eye irritation).  ? valsartan (DIOVAN) 320 MG tablet Take 320 mg by mouth in the morning.  ? ?No facility-administered encounter medications on file as of 12/30/2021.  ? ? ? ?Objective:  ? ? ?PHYSICAL EXAMINATION:   ? ?VITALS:   ?Vitals:  ? 12/30/21 0916  ?BP: (!) 142/72  ?Pulse: 72  ?SpO2: 97%  ?Weight: 202 lb 3.2 oz (91.7 kg)  ?Height: 5' 9"  (1.753 m)  ? ? ? ? ?GEN:  The patient appears stated age and is in NAD. ?HEENT:  Normocephalic, atraumatic.  The mucous membranes are moist. The superficial  temporal arteries are without ropiness or tenderness. ? ? ?Neurological examination: ? ?Orientation: The patient is alert and oriented x3. ?Cranial nerves: There is good facial symmetry. The speech is fluent and clear. Soft palate rises symmetrically and there is no tongue deviation. Hearing is intact to conversational tone. ?Sensation: Sensation is intact to light touch throughout ?Motor: Strength is at least antigravity x4. ? ?Movement examination: ?Tone: There is normal tone in the UE/LE ?Abnormal movements: There is no rest tremor.  There is rare intention tremor on the left.  This resolved post programming. ?Coordination:  There is no decremation with RAM's ?Gait and Station: The patient has no difficulty arising out of a deep-seated chair without the use of the hands. The patient's stride length is good ?I have reviewed and interpreted the following labs independently ?  Chemistry   ?   ?Component Value Date/Time  ? NA 138 10/30/2021 0603  ? NA 142 12/07/2017 1225  ? K 3.7 10/30/2021 0603  ? CL 101 10/30/2021 0603  ? CO2 25 10/30/2021 0603  ? BUN 18 10/30/2021 0603  ? BUN 15 12/07/2017 1225  ? CREATININE 1.14 10/30/2021 0603  ? CREATININE 1.11 06/03/2016 1652  ?    ?Component Value Date/Time  ? CALCIUM 9.2 10/30/2021 0603  ? ALKPHOS 70 01/12/2018 0759  ? AST 32 01/12/2018 0759  ? ALT 39 01/12/2018 0759  ? BILITOT 0.7 01/12/2018 0759  ? BILITOT 0.3 12/07/2017 1225  ?  ? ? ? ? ?Cc:  Wardell Honour, MD ? ? ?

## 2021-12-30 ENCOUNTER — Ambulatory Visit: Payer: Medicare HMO | Admitting: Neurology

## 2021-12-30 DIAGNOSIS — G250 Essential tremor: Secondary | ICD-10-CM

## 2021-12-30 NOTE — Procedures (Signed)
DBS Programming was performed.   ? ?Manufacturer of DBS device: Medtronic ? ?Total time spent programming was 10 minutes.  Device was confirmed to be on.  Soft start was confirmed to be on.  Impedences were checked and were within normal limits.  Battery was checked and was near the end of life on the right.  Final settings were as follows: ? ? Active Contact Amplitude (mA) PW (ms) Frequency (hz) Side Effects Battery  ?Left Brain        ?04/10/20 0-1+ 2.2 90 150  2.96  ?04/10/21 0-1+ 2.2 90 150  2.93  ?06/27/21 0-1+ 2.2 90 150  2.92  ?10/15/21 0-1+ 2.2 90 150  2.90  ?12/30/21 0-1+ 2.2 90 150  2.89  ?        ?        ?Right Brain        ?04/10/20 2-1+ 2.7 90 150  2.91  ?04/10/21 2-1+ 2.7 90 150  2.80  ?06/27/21 2-1+ 2.7 90 150  2.75  ?10/15/21 2-1+ 2.5 90 140  2.58  ?12/30/21 2-1+ 2.7 90 140  3.00  ?        ?        ? ?

## 2022-01-15 ENCOUNTER — Ambulatory Visit
Admission: RE | Admit: 2022-01-15 | Discharge: 2022-01-15 | Disposition: A | Discharge status: Home / Self Care | Payer: Medicare HMO | Source: Ambulatory Visit | Attending: Urology | Admitting: Urology

## 2022-01-15 DIAGNOSIS — N281 Cyst of kidney, acquired: Secondary | ICD-10-CM | POA: Diagnosis not present

## 2022-01-15 DIAGNOSIS — D1771 Benign lipomatous neoplasm of kidney: Secondary | ICD-10-CM | POA: Diagnosis present

## 2022-01-27 ENCOUNTER — Ambulatory Visit: Payer: Self-pay | Admitting: Urology

## 2022-01-29 ENCOUNTER — Encounter: Payer: Self-pay | Admitting: Urology

## 2022-01-29 ENCOUNTER — Ambulatory Visit: Payer: Medicare HMO | Admitting: Urology

## 2022-01-29 VITALS — BP 142/65 | HR 76 | Ht 69.0 in | Wt 193.0 lb

## 2022-01-29 DIAGNOSIS — D1771 Benign lipomatous neoplasm of kidney: Secondary | ICD-10-CM

## 2022-01-29 DIAGNOSIS — N281 Cyst of kidney, acquired: Secondary | ICD-10-CM

## 2022-01-29 NOTE — Progress Notes (Signed)
01/29/2022 10:19 AM   Carlos Hunt April 12, 1946 [id number]  Referring provider: Wardell Honour, MD [place],  [place] [address]  Chief Complaint  Patient presents with   Follow-up    Urologic history: 1.  Left angiomyolipoma -8 mm 2017 -12 mm 12/2017 -Asymptomatic   2.  Renal cysts -Bilateral simple renal cysts   HPI: 76 y.o. male presents for annual follow-up.  No problems since last years visit Denies dysuria, gross hematuria Denies flank, abdominal or pelvic pain Renal ultrasound performed 01/15/2022 shows stable renal cysts and left angiomyolipoma stable at 10 x 9 x 8 mm   PMH: Past Medical History:  Diagnosis Date   Adenomatous polyps 04/02/2017   Brain abscess 03/11/2017   Elevated liver enzymes    Erectile dysfunction    Essential hypertension, benign 02/14/2013   Essential tremor    GERD (gastroesophageal reflux disease)    Head injury    Hx of colonic polyps    Hyperlipidemia    Hypertension    New onset atrial flutter (HCC)    Pain in left knee    Pain, joint, shoulder    PONV (postoperative nausea and vomiting)    nausea no vomiting   Seasonal allergies    Sleep apnea    Testicular hypofunction    Tobacco use disorder    Tremor    Unspecified vitamin D deficiency     Surgical History: Past Surgical History:  Procedure Laterality Date   COLONOSCOPY  09/09/2011   normal.  Previous polyps.  [name]. Repeat 5 years.   COLONOSCOPY N/A 07/18/2016   Procedure: COLONOSCOPY;  Surgeon: Manya Silvas, MD;  Location: [facility] ENDOSCOPY;  Service: Endoscopy;  Laterality: N/A;   COLONOSCOPY N/A 10/10/2021   Procedure: COLONOSCOPY;  Surgeon: [name], DO;  Location: [facility] ENDOSCOPY;  Service: Gastroenterology;  Laterality: N/A;   DEEP BRAIN STIMULATOR PLACEMENT     HAND SURGERY Left 2011   trauma   HERNIA REPAIR     LAPAROSCOPIC INGUINAL HERNIA WITH UMBILICAL HERNIA Right 03/07/1600   Procedure: LAPAROSCOPIC INGUINAL HERNIA  REPAIR, right with umbilical herniorrhaphy;  Surgeon: Florene Glen, MD;  Location: [facility] ORS;  Service: General;  Laterality: Right;   MINOR PLACEMENT OF FIDUCIAL N/A 11/24/2017   Procedure: MINOR PLACEMENT OF FIDUCIAL;  Surgeon: Erline Levine, MD;  Location: [place];  Service: Neurosurgery;  Laterality: N/A;  Fiducial placement   PULSE GENERATOR IMPLANT Bilateral 10/23/2016   Procedure: BILATERAL IMPLANTABLE PULSE GENERATOR;  Surgeon: Erline Levine, MD;  Location: [place];  Service: Neurosurgery;  Laterality: Bilateral;  BILATERAL IMPLANTABLE PULSE GENERATOR   PULSE GENERATOR IMPLANT Left 01/12/2018   Procedure: UNILATERAL PULSE GENERATOR IMPLANT AND EXTENTIONS FOR DEEP BRAIN STIMULATOR;  Surgeon: Erline Levine, MD;  Location: [place];  Service: Neurosurgery;  Laterality: Left;   PULSE GENERATOR IMPLANT Right 10/31/2021   Procedure: Change Implantable Pulse Generator battery;  Surgeon: Dawley, Theodoro Doing, DO;  Location: [place];  Service: Neurosurgery;  Laterality: Right;   SUBTHALAMIC STIMULATOR INSERTION Bilateral 10/07/2016   Procedure: BILATERAL DEEP BRAIN STIMULATOR PLACEMENT;  Surgeon: Erline Levine, MD;  Location: [place];  Service: Neurosurgery;  Laterality: Bilateral;  BILATERAL DEEP BRAIN STIMULATOR PLACEMENT   SUBTHALAMIC STIMULATOR INSERTION Left 12/04/2017   Procedure: Left Deep Brain Stimulator Placement;  Surgeon: Erline Levine, MD;  Location: [place];  Service: Neurosurgery;  Laterality: Left;  Left deep brain stimulator placement   SUBTHALAMIC STIMULATOR REMOVAL Left 03/10/2017   Procedure: REMOVAL DEEP BRAIN STIMULATOR  AND ASPIRATION OF BRAIN ABSCESS;  Surgeon: Earnie Larsson, MD;  Location: [place];  Service: Neurosurgery;  Laterality: Left;   UMBILICAL HERNIA REPAIR N/A 07/15/2017   Procedure: HERNIA REPAIR UMBILICAL ADULT;  Surgeon: Florene Glen, MD;  Location: [facility] ORS;  Service: General;  Laterality: N/A;    Home Medications:  Allergies as of 01/29/2022       Reactions   Amlodipine     Other reaction(s): Headache   Procaine Nausea And Vomiting, Other (See Comments)   NOVOCAINE   Sulfacetamide Sodium-sulfur Rash        Medication List        Accurate as of Jan 29, 2022 10:19 AM. If you have any questions, ask your nurse or doctor.          Airborne Google 1 tablet by mouth in the morning.   aspirin EC 81 MG tablet Take 1 tablet (81 mg total) by mouth daily. Swallow whole.   atorvastatin 40 MG tablet Commonly known as: LIPITOR Take 40 mg by mouth daily.   B-12 3000 MCG Caps Take 3,000 mcg by mouth in the morning.   hydrALAZINE 10 MG tablet Commonly known as: APRESOLINE Take 10 mg by mouth 3 (three) times daily.   hydrochlorothiazide 25 MG tablet Commonly known as: HYDRODIURIL Take 1 tablet (25 mg total) by mouth daily.   isosorbide mononitrate 60 MG 24 hr tablet Commonly known as: IMDUR Take 60 mg by mouth daily.   metroNIDAZOLE 1 % gel Commonly known as: METROGEL Apply 1 application topically daily as needed (rosacea).   multivitamin with minerals Tabs tablet Take 1 tablet by mouth in the morning. Centrum for Men   OMEGA-3 EPA FISH OIL PO Take 2 capsules by mouth in the morning.   PAPAYA AND ENZYMES PO Take 1 tablet by mouth daily as needed (for heartburn/indigestion).   Refresh 1.4-0.6 % Soln Generic drug: Polyvinyl Alcohol-Povidone PF Place 1-2 drops into both eyes 3 (three) times daily as needed (dry/irritated eyes.).   research study medication Take 1 each by mouth daily. Atorvastatin Calcium (INVESTIGATIONAL ATORVASTATIN/PLACEBO) 40 MG-PRagmatic EValuation of evENTs And Benefits of Lipid-lowering in oldEr adults (All Site sIRB) by [facility]   sodium chloride 0.65 % nasal spray Commonly known as: OCEAN Place 1 spray into the nose at bedtime.   valsartan 320 MG tablet Commonly known as: DIOVAN Take 320 mg by mouth in the morning.   VISINE MAXIMUM REDNESS RELIEF OP Place 1 drop into both eyes daily as needed (for eye  irritation).   Vitamin C 500 MG Caps Take 500 mg by mouth in the morning.   Vitamin D3 50 MCG (2000 UT) Tabs Take 2,000 Units by mouth in the morning.        Allergies:  Allergies  Allergen Reactions   Amlodipine     Other reaction(s): Headache   Procaine Nausea And Vomiting and Other (See Comments)    NOVOCAINE   Sulfacetamide Sodium-Sulfur Rash    Family History: Family History  Problem Relation Age of Onset   Dementia Mother    Alzheimer's disease Mother    Parkinson's disease Father    Cancer Father 76       prostate   Heart disease Father 11       CAD/CABG   Hypertension Father    Hyperlipidemia Father    Parkinsonism Father    Coronary artery disease Father    Heart attack Father    Colon cancer Father    Alzheimer's  disease Sister    Diabetes Sister    Parkinson's disease Brother    Coronary artery disease Brother     Social History:  reports that he quit smoking about 51 years ago. His smoking use included cigarettes. He has a 30.00 pack-year smoking history. He has never used smokeless tobacco. He reports current alcohol use of about 1.0 standard drink per week. He reports that he does not use drugs.   Physical Exam: BP (!) 142/65   Pulse 76   Ht 5' 9"  (1.753 m)   Wt 193 lb (87.5 kg)   BMI 28.50 kg/m   Constitutional:  Alert and oriented, No acute distress. HEENT: [place] AT, moist mucus membranes.  Trachea midline, no masses. Cardiovascular: No clubbing, cyanosis, or edema. Respiratory: Normal respiratory effort, no increased work of breathing. Skin: No rashes, bruises or suspicious lesions. Neurologic: Grossly intact, no focal deficits, moving all 4 extremities. Psychiatric: Normal mood and affect.   Pertinent Imaging: Ultrasound images were personally reviewed and interpreted  Ultrasound renal complete  Narrative CLINICAL DATA:  Follow-up angiomyolipoma of the left kidney.  EXAM: RENAL / URINARY TRACT ULTRASOUND COMPLETE  COMPARISON:   Multiple prior ultrasound examinations.  FINDINGS: Right Kidney:  Renal measurements: 12.5 x 6.3 x 4.8 cm = volume: 197.9 mL. Normal renal cortical thickness and echogenicity without hydronephrosis. Stable 4.2 x 3.1 x 3.5 cm cyst associated with the lower pole region of the kidney. A few stable thin septations are noted.  Stable 1.7 x 1.1 x 1.4 cm midpole cyst.  Left Kidney:  Renal measurements: 10.3 x 6.1 x 5.6 cm = volume: 182.8 mL. Normal renal cortical thickness and echogenicity. No hydronephrosis. There is a stable 1.2 x 1.0 x 0.8 cm echogenic lesion in the midpole region. This is consistent with a benign angiomyolipoma.  Bladder:  Appears normal for degree of bladder distention.  Other:  None.  IMPRESSION: Stable renal cysts and left renal angiomyolipoma.    Assessment & Plan:    1.  Left renal angiomyolipoma Stable Follow-up renal ultrasound 2 years  2.  Renal cysts Stable   Abbie Sons, MD  [facility] [address], [place], [place] [address] [phone]

## 2022-08-28 NOTE — Progress Notes (Signed)
Assessment/Plan:    1.  Essential Tremor  -Status post bilateral VIM DBS in January, 2018.  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  Patient subsequently had a left VIM DBS on December 04, 2017 with IPG placement on Jan 12, 2018.  His right IPG was changed out October 31, 2021.    2.  Low back pain  -has appt tomorrow with [place] [facility]  Subjective:   Carlos Hunt was seen today in follow up for essential tremor.  Records reviewed.  Patient doing well since our last visit in terms of tremor.  Notes are reviewed from other physicians.  He has been followed for simple renal cysts.  Seems to be doing well in that regard.  Was seen by primary care November 20 regarding back pain.  Sounds like he was referred to Dr. Reatha Armour and given a prescription for Flexeril.  He is scheduled for tomorrow but pt doesn't know who he is seeing.  Pt using a cane.  He hasn't been using the flexeril as pharmacist told him it was recommended for over 31 y/o.  Pain is radiating down both legs  Current prescribed movement disorder medications: n/a   ALLERGIES:   Allergies  Allergen Reactions   Amlodipine     Other reaction(s): Headache   Procaine Nausea And Vomiting and Other (See Comments)    NOVOCAINE   Sulfacetamide Sodium-Sulfur Rash    CURRENT MEDICATIONS:  Outpatient Encounter Medications as of 09/09/2022  Medication Sig   Ascorbic Acid (VITAMIN C) 500 MG CAPS Take 500 mg by mouth in the morning.   aspirin EC 81 MG tablet Take 1 tablet (81 mg total) by mouth daily. Swallow whole.   atorvastatin (LIPITOR) 40 MG tablet Take 40 mg by mouth daily.   Cholecalciferol (VITAMIN D3) 2000 units TABS Take 2,000 Units by mouth in the morning.   Cyanocobalamin (B-12) 3000 MCG CAPS Take 3,000 mcg by mouth in the morning.   Digestive Enzymes (PAPAYA AND ENZYMES PO) Take 1 tablet by mouth daily as needed (for heartburn/indigestion).   hydrALAZINE (APRESOLINE) 10 MG  tablet Take 10 mg by mouth 3 (three) times daily.   hydrochlorothiazide (HYDRODIURIL) 25 MG tablet Take 1 tablet (25 mg total) by mouth daily.   isosorbide mononitrate (IMDUR) 60 MG 24 hr tablet Take 60 mg by mouth daily.   metroNIDAZOLE (METROGEL) 1 % gel Apply 1 application topically daily as needed (rosacea).   Multiple Vitamin (MULTIVITAMIN WITH MINERALS) TABS tablet Take 1 tablet by mouth in the morning. Centrum for Men   Multiple Vitamins-Minerals (AIRBORNE) CHEW Chew 1 tablet by mouth in the morning.   Omega-3 Fatty Acids (OMEGA-3 EPA FISH OIL PO) Take 2 capsules by mouth in the morning. (Patient not taking: Reported on 09/09/2022)   Polyvinyl Alcohol-Povidone PF (REFRESH) 1.4-0.6 % SOLN Place 1-2 drops into both eyes 3 (three) times daily as needed (dry/irritated eyes.).   research study medication Take 1 each by mouth daily. Atorvastatin Calcium (INVESTIGATIONAL ATORVASTATIN/PLACEBO) 40 MG-PRagmatic EValuation of evENTs And Benefits of Lipid-lowering in oldEr adults (All Site sIRB) by [facility]   sodium chloride (OCEAN) 0.65 % nasal spray Place 1 spray into the nose at bedtime.   Tetrahydroz-Glyc-Hyprom-PEG (VISINE MAXIMUM REDNESS RELIEF OP) Place 1 drop into both eyes daily as needed (for eye irritation).   valsartan (DIOVAN) 320 MG tablet Take 320 mg by mouth in the morning.   No facility-administered encounter medications on  file as of 09/09/2022.     Objective:    PHYSICAL EXAMINATION:    VITALS:   Vitals:   09/09/22 0931  BP: 132/62  Pulse: 62  SpO2: 98%  Weight: 200 lb 12.8 oz (91.1 kg)  Height: 5' 10"  (1.778 m)       GEN:  The patient appears stated age and is in NAD. HEENT:  Normocephalic, atraumatic.  The mucous membranes are moist. The superficial temporal arteries are without ropiness or tenderness.   Neurological examination:  Orientation: The patient is alert and oriented x3. Cranial nerves: There is good facial symmetry. The speech is fluent and clear.  Soft palate rises symmetrically and there is no tongue deviation. Hearing is intact to conversational tone. Sensation: Sensation is intact to light touch throughout Motor: Strength is at least antigravity x4.  Movement examination: Tone: There is normal tone in the UE/LE Abnormal movements: There is no rest tremor.  There is no postural or intention tremor. Coordination:  There is no decremation with RAM's Gait and Station: The patient is slow to arise.  Gait is antalgic and cautious.  Ambulates with cane. I have reviewed and interpreted the following labs independently   Chemistry      Component Value Date/Time   NA 138 10/30/2021 0603   NA 142 12/07/2017 1225   K 3.7 10/30/2021 0603   CL 101 10/30/2021 0603   CO2 25 10/30/2021 0603   BUN 18 10/30/2021 0603   BUN 15 12/07/2017 1225   CREATININE 1.14 10/30/2021 0603   CREATININE 1.11 06/03/2016 1652      Component Value Date/Time   CALCIUM 9.2 10/30/2021 0603   ALKPHOS 70 01/12/2018 0759   AST 32 01/12/2018 0759   ALT 39 01/12/2018 0759   BILITOT 0.7 01/12/2018 0759   BILITOT 0.3 12/07/2017 1225     Total time spent on today's visit was 30 minutes, including both face-to-face time and nonface-to-face time.  Time included that spent on review of records (prior notes available to me/labs/imaging if pertinent), discussing treatment and goals, answering patient's questions and coordinating care.    Cc:  Wardell Honour, MD

## 2022-09-09 ENCOUNTER — Ambulatory Visit: Payer: Medicare HMO | Admitting: Neurology

## 2022-09-09 VITALS — BP 132/62 | HR 62 | Ht 70.0 in | Wt 200.8 lb

## 2022-09-09 DIAGNOSIS — G250 Essential tremor: Secondary | ICD-10-CM

## 2022-09-09 DIAGNOSIS — M5442 Lumbago with sciatica, left side: Secondary | ICD-10-CM

## 2022-09-09 DIAGNOSIS — M5441 Lumbago with sciatica, right side: Secondary | ICD-10-CM

## 2022-09-09 NOTE — Procedures (Signed)
DBS Programming was performed.    Manufacturer of DBS device: Medtronic  Total time spent programming was 5 minutes.  Device was confirmed to be on.  Soft start was confirmed to be on.  Impedences were checked and were within normal limits.  Battery was checked and was near the end of life on the right.  Final settings were as follows:   Active Contact Amplitude (mA) PW (ms) Frequency (hz) Side Effects Battery  Left Brain        04/10/20 0-1+ 2.2 90 150  2.96  04/10/21 0-1+ 2.2 90 150  2.93  06/27/21 0-1+ 2.2 90 150  2.92  10/15/21 0-1+ 2.2 90 150  2.90  12/30/21 0-1+ 2.2 90 150  2.89  09/09/22 0-1+ 2.2 90 150  2.84                  Right Brain        04/10/20 2-1+ 2.7 90 150  2.91  04/10/21 2-1+ 2.7 90 150  2.80  06/27/21 2-1+ 2.7 90 150  2.75  10/15/21 2-1+ 2.5 90 140  2.58  12/30/21 2-1+ 2.7 90 140  3.00  09/09/22 2-1+ 2.7 90 140  2.96

## 2022-09-12 ENCOUNTER — Other Ambulatory Visit (HOSPITAL_COMMUNITY): Payer: Self-pay | Admitting: Neurological Surgery

## 2022-09-12 DIAGNOSIS — M48062 Spinal stenosis, lumbar region with neurogenic claudication: Secondary | ICD-10-CM

## 2022-09-25 ENCOUNTER — Ambulatory Visit (HOSPITAL_COMMUNITY)
Admission: RE | Admit: 2022-09-25 | Discharge: 2022-09-25 | Disposition: A | Discharge status: Home / Self Care | Payer: Medicare HMO | Source: Ambulatory Visit | Attending: Neurological Surgery | Admitting: Neurological Surgery

## 2022-09-25 ENCOUNTER — Encounter (HOSPITAL_COMMUNITY): Payer: Self-pay

## 2022-09-25 DIAGNOSIS — M48062 Spinal stenosis, lumbar region with neurogenic claudication: Secondary | ICD-10-CM

## 2022-10-10 ENCOUNTER — Ambulatory Visit (HOSPITAL_COMMUNITY)
Admission: RE | Admit: 2022-10-10 | Discharge: 2022-10-10 | Disposition: A | Discharge status: Home / Self Care | Payer: Medicare HMO | Source: Ambulatory Visit | Attending: Neurological Surgery | Admitting: Neurological Surgery

## 2022-10-10 DIAGNOSIS — M48062 Spinal stenosis, lumbar region with neurogenic claudication: Secondary | ICD-10-CM | POA: Diagnosis present

## 2022-10-19 ENCOUNTER — Other Ambulatory Visit: Payer: Self-pay

## 2022-10-19 ENCOUNTER — Emergency Department
Admission: EM | Admit: 2022-10-19 | Discharge: 2022-10-19 | Disposition: A | Discharge status: Home / Self Care | Payer: Medicare HMO | Attending: Emergency Medicine | Admitting: Emergency Medicine

## 2022-10-19 DIAGNOSIS — U071 COVID-19: Secondary | ICD-10-CM | POA: Insufficient documentation

## 2022-10-19 DIAGNOSIS — B349 Viral infection, unspecified: Secondary | ICD-10-CM

## 2022-10-19 DIAGNOSIS — Z7982 Long term (current) use of aspirin: Secondary | ICD-10-CM | POA: Insufficient documentation

## 2022-10-19 DIAGNOSIS — R519 Headache, unspecified: Secondary | ICD-10-CM | POA: Diagnosis present

## 2022-10-19 MED ORDER — NIRMATRELVIR/RITONAVIR (PAXLOVID)TABLET: 3.0000 | ORAL_TABLET | Freq: Two times a day (BID) | ORAL | 0 refills | Status: AC

## 2022-10-19 MED ORDER — NIRMATRELVIR/RITONAVIR (PAXLOVID)TABLET: 3.0000 | ORAL_TABLET | Freq: Two times a day (BID) | ORAL | 0 refills | Status: DC

## 2022-10-19 NOTE — Discharge Instructions (Addendum)
Please take Tylenol 1000 mg every 6 hours as needed for fevers chills and body aches.  Make sure you are drinking lots of fluids.  Take over-the-counter cough and cold medication as needed for cough and congestion or runny nose.  Take Paxlovid as prescribed.  Return to the ER for any shortness of breath, weakness, fevers above 101 that or not going down with Tylenol, worsening symptoms or any urgent changes in health.  Please quarantine and wear a mask to help prevent spreading   While taking Paxlovid please stop taking atorvastatin.

## 2022-10-19 NOTE — ED Triage Notes (Signed)
Pt reports tested positive for covid and feels really tired and has a headache.

## 2022-10-19 NOTE — ED Provider Notes (Signed)
[facility] Provider Note   CSN: [id number] Arrival date & time: 10/19/22  0935     History  Chief Complaint  Patient presents with   Headache    Carlos Hunt is a 77 y.o. male.  Presents to the emergency department evaluation of chills, body aches and headache.  Patient was exposed to [place] and took a test on Friday that was negative.  Yesterday he did not test but today developed chills, body ache and headache and tested positive for COVID at home.  Patient feels fatigued and weak.  No dizziness or lightheadedness.  No chest pain shortness of breath.  No cough.  No abdominal pain nausea vomiting or diarrhea.  Patient is fully vaccinated for COVID.  He is not taking any medication such as Tylenol or ibuprofen for symptoms.  HPI     Home Medications Prior to Admission medications   Medication Sig Start Date End Date Taking? Authorizing Provider  Ascorbic Acid (VITAMIN C) 500 MG CAPS Take 500 mg by mouth in the morning.    [provider]  aspirin EC 81 MG tablet Take 1 tablet (81 mg total) by mouth daily. Swallow whole. 11/07/21   Dawley, Troy C, DO  atorvastatin (LIPITOR) 40 MG tablet Take 40 mg by mouth daily. 01/15/22   [provider]  Cholecalciferol (VITAMIN D3) 2000 units TABS Take 2,000 Units by mouth in the morning.    [provider]  Cyanocobalamin (B-12) 3000 MCG CAPS Take 3,000 mcg by mouth in the morning.    [provider]  Digestive Enzymes (PAPAYA AND ENZYMES PO) Take 1 tablet by mouth daily as needed (for heartburn/indigestion).    [provider]  hydrALAZINE (APRESOLINE) 10 MG tablet Take 10 mg by mouth 3 (three) times daily. 03/26/20   [provider]  hydrochlorothiazide (HYDRODIURIL) 25 MG tablet Take 1 tablet (25 mg total) by mouth daily. 06/02/17   Wardell Honour, MD  isosorbide mononitrate (IMDUR) 60 MG 24 hr tablet Take 60 mg by mouth daily. 01/14/22   [provider]  metroNIDAZOLE (METROGEL) 1 % gel Apply 1 application topically daily as needed (rosacea). 02/19/21   [provider]  Multiple Vitamin (MULTIVITAMIN WITH MINERALS) TABS tablet Take 1 tablet by mouth in the morning. Centrum for Men    [provider]  Multiple Vitamins-Minerals (AIRBORNE) CHEW Chew 1 tablet by mouth in the morning.    [provider]  nirmatrelvir/ritonavir (PAXLOVID) 20 x 150 MG & 10 x 100MG TABS Take 3 tablets by mouth 2 (two) times daily for 5 days. Patient GFR is 60. Take nirmatrelvir (150 mg) two tablets twice daily for 5 days and ritonavir (100 mg) one tablet twice daily for 5 days. 10/19/22 10/24/22  Duanne Guess, PA-C  Omega-3 Fatty Acids (OMEGA-3 EPA FISH OIL PO) Take 2 capsules by mouth in the morning. Patient not taking: Reported on 09/09/2022    [provider]  Polyvinyl Alcohol-Povidone PF (REFRESH) 1.4-0.6 % SOLN Place 1-2 drops into both eyes 3 (three) times daily as needed (dry/irritated eyes.).    [provider]  research study medication Take 1 each by mouth daily. Atorvastatin Calcium (INVESTIGATIONAL ATORVASTATIN/PLACEBO) 40 MG-PRagmatic EValuation of evENTs And Benefits of Lipid-lowering in oldEr adults (All Site sIRB) by [employer]    [provider]  sodium chloride (OCEAN) 0.65 % nasal spray Place 1 spray into the nose at bedtime.    [provider]  Tetrahydroz-Glyc-Hyprom-PEG (  VISINE MAXIMUM REDNESS RELIEF OP) Place 1 drop into both eyes daily as needed (for eye irritation).    [provider]  valsartan (DIOVAN) 320 MG tablet Take 320 mg by mouth in the morning. 04/06/20   [provider]      Allergies    Amlodipine, Procaine, and Sulfacetamide sodium-sulfur    Review of Systems   Review of Systems  Physical Exam Updated Vital Signs BP 139/73 (BP Location: Right Arm)   Pulse 89   Temp 98.2 F (36.8 C) (Oral)   Resp 19   Ht 5' 10"$  (1.778 m)   Wt 91 kg    SpO2 95%   BMI 28.79 kg/m  Physical Exam Constitutional:      Appearance: He is well-developed.  HENT:     Head: Normocephalic and atraumatic.  Eyes:     Conjunctiva/sclera: Conjunctivae normal.  Cardiovascular:     Rate and Rhythm: Normal rate.  Pulmonary:     Effort: Pulmonary effort is normal. No respiratory distress.     Breath sounds: Normal breath sounds. No wheezing, rhonchi or rales.  Chest:     Chest wall: No tenderness.  Abdominal:     General: There is no distension.     Tenderness: There is no abdominal tenderness.  Musculoskeletal:        General: Normal range of motion.     Cervical back: Normal range of motion.  Skin:    General: Skin is warm.     Findings: No rash.  Neurological:     Mental Status: He is alert and oriented to person, place, and time.  Psychiatric:        Behavior: Behavior normal.        Thought Content: Thought content normal.     ED Results / Procedures / Treatments   Labs (all labs ordered are listed, but only abnormal results are displayed) Labs Reviewed - No data to display  EKG None  Radiology No results found.  Procedures Procedures   Medications Ordered in ED Medications - No data to display  ED Course/ Medical Decision Making/ A&P                             Medical Decision Making  77 year old male positive for COVID.  He has symptoms consistent with COVID-19.  Patient describes mild flulike symptoms.  No cough, chest pain or shortness of breath.  Patient with headache fatigue and bodyaches.  Recommend he take Tylenol every 6 hours and due to age will start Paxlovid.  Kidney function reviewed, GFR greater than 60.  Patient will avoid his statin medication while on the Paxlovid.  He understands signs and symptoms return to the ER for. Final Clinical Impression(s) / ED Diagnoses Final diagnoses:  COVID-19  Viral illness    Rx / DC Orders ED Discharge Orders          Ordered    nirmatrelvir/ritonavir  (PAXLOVID) 20 x 150 MG & 10 x 100MG TABS  2 times daily,   Status:  Discontinued        10/19/22 1110    nirmatrelvir/ritonavir (PAXLOVID) 20 x 150 MG & 10 x 100MG TABS  2 times daily        10/19/22 1111              Renata Caprice 10/19/22 1114    Carrie Mew, MD 10/20/22 1843

## 2022-10-19 NOTE — ED Triage Notes (Signed)
Pt states he has covid and has a HA and feels weak.

## 2022-11-19 ENCOUNTER — Other Ambulatory Visit: Payer: Self-pay | Admitting: Neurological Surgery

## 2022-11-21 ENCOUNTER — Encounter (HOSPITAL_COMMUNITY): Payer: Self-pay

## 2022-11-21 NOTE — Progress Notes (Signed)
Surgical Instructions    Your procedure is scheduled on Friday, November 28, 2022.  Report to [facility] Main Entrance "A" at 1 P.M., then check in with the Admitting office.  Call this number if you have problems the morning of surgery:  [phone]   If you have any questions prior to your surgery date call [phone]: Open Monday-Friday 8am-4pm If you experience any cold or flu symptoms such as cough, fever, chills, shortness of breath, etc. between now and your scheduled surgery, please notify us at the above number     Remember:  Do not eat or drink after midnight the night before your surgery   Take these medicines the morning of surgery with A SIP OF WATER:  atorvastatin (LIPITOR)  hydrALAZINE (APRESOLINE)  isosorbide mononitrate (IMDUR)    As needed: Polyvinyl Alcohol-Povidone PF (REFRESH)  Tetrahydroz-Glyc-Hyprom-PEG (VISINE MAXIMUM REDNESS RELIEF OP)    As of today, STOP taking any Aspirin (unless otherwise instructed by your surgeon) Aleve, Naproxen, Ibuprofen, Motrin, Advil, Goody's, BC's, all herbal medications, fish oil, and all vitamins.           Do not wear jewelry Do not wear lotions, powders, cologne or deodorant  Men may shave face and neck. Do not bring valuables to the hospital.  [facility] is not responsible for any belongings or valuables.    Do NOT Smoke (Tobacco/Vaping)  24 hours prior to your procedure  If you use a CPAP at night, you may bring your mask for your overnight stay.   Contacts, glasses, hearing aids, dentures or partials may not be worn into surgery, please bring cases for these belongings   For patients admitted to the hospital, discharge time will be determined by your treatment team.   Patients discharged the day of surgery will not be allowed to drive home, and someone needs to stay with them for 24 hours.   SURGICAL WAITING ROOM VISITATION Patients having surgery or a procedure may have no more than 2 support people in the  waiting area - these visitors may rotate.   Children under the age of 27 must have an adult with them who is not the patient. If the patient needs to stay at the hospital during part of their recovery, the visitor guidelines for inpatient rooms apply. Pre-op nurse will coordinate an appropriate time for 1 support person to accompany patient in pre-op.  This support person may not rotate.   Please refer to [email or web address] for the visitor guidelines for Inpatients (after your surgery is over and you are in a regular room).    Special instructions:    Oral Hygiene is also important to reduce your risk of infection.  Remember - BRUSH YOUR TEETH THE MORNING OF SURGERY WITH YOUR REGULAR TOOTHPASTE   [place]- Preparing For Surgery  Before surgery, you can play an important role. Because skin is not sterile, your skin needs to be as free of germs as possible. You can reduce the number of germs on your skin by washing with CHG (chlorahexidine gluconate) Soap before surgery.  CHG is an antiseptic cleaner which kills germs and bonds with the skin to continue killing germs even after washing.     Please do not use if you have an allergy to CHG or antibacterial soaps. If your skin becomes reddened/irritated stop using the CHG.  Do not shave (including legs and underarms) for at least 48 hours prior to first CHG shower. It is OK to shave your face.  Please follow  these instructions carefully.     Shower the NIGHT BEFORE SURGERY and the MORNING OF SURGERY with CHG Soap.   If you chose to wash your hair, wash your hair first as usual with your normal shampoo. After you shampoo, rinse your hair and body thoroughly to remove the shampoo.  Then [employer] and genitals (private parts) with your normal soap and rinse thoroughly to remove soap.  After that Use CHG Soap as you would any other liquid soap. You can apply CHG directly to the skin and wash  gently with a scrungie or a clean washcloth.   Apply the CHG Soap to your body ONLY FROM THE NECK DOWN.  Do not use on open wounds or open sores. Avoid contact with your eyes, ears, mouth and genitals (private parts). Wash Face and genitals (private parts)  with your normal soap.   Wash thoroughly, paying special attention to the area where your surgery will be performed.  Thoroughly rinse your body with warm water from the neck down.  DO NOT shower/wash with your normal soap after using and rinsing off the CHG Soap.  Pat yourself dry with a CLEAN TOWEL.  Wear CLEAN PAJAMAS to bed the night before surgery  Place CLEAN SHEETS on your bed the night before your surgery  DO NOT SLEEP WITH PETS.   Day of Surgery:  Take a shower with CHG soap. Wear Clean/Comfortable clothing the morning of surgery Do not apply any deodorants/lotions.   Remember to brush your teeth WITH YOUR REGULAR TOOTHPASTE.    If you received a COVID test during your pre-op visit, it is requested that you wear a mask when out in public, stay away from anyone that may not be feeling well, and notify your surgeon if you develop symptoms. If you have been in contact with anyone that has tested positive in the last 10 days, please notify your surgeon.    Please read over the following fact sheets that you were given.

## 2022-11-24 ENCOUNTER — Inpatient Hospital Stay (HOSPITAL_COMMUNITY)
Admission: RE | Admit: 2022-11-24 | Discharge: 2022-11-24 | Disposition: A | Discharge status: Home / Self Care | Payer: Medicare HMO | Source: Ambulatory Visit

## 2022-11-24 HISTORY — DX: Cardiac arrhythmia, unspecified: I49.9

## 2022-11-28 ENCOUNTER — Encounter (HOSPITAL_COMMUNITY): Admission: RE | Payer: Self-pay | Source: Home / Self Care

## 2022-11-28 ENCOUNTER — Ambulatory Visit (HOSPITAL_COMMUNITY): Admission: RE | Admit: 2022-11-28 | Payer: Medicare HMO | Source: Home / Self Care | Admitting: Neurological Surgery

## 2022-11-28 SURGERY — LUMBAR LAMINECTOMY/ DECOMPRESSION WITH MET-RX
Anesthesia: General | Laterality: Left

## 2023-01-27 ENCOUNTER — Telehealth: Payer: Self-pay | Admitting: Urology

## 2023-01-27 ENCOUNTER — Other Ambulatory Visit: Payer: Self-pay | Admitting: *Deleted

## 2023-01-27 DIAGNOSIS — N2889 Other specified disorders of kidney and ureter: Secondary | ICD-10-CM

## 2023-01-27 NOTE — Telephone Encounter (Signed)
Please schedule I year follow-up May 2025 with renal ultrasound prior.  Diagnosis-renal mass

## 2023-02-23 ENCOUNTER — Other Ambulatory Visit: Payer: Self-pay | Admitting: Neurological Surgery

## 2023-02-25 ENCOUNTER — Encounter (HOSPITAL_COMMUNITY): Payer: Self-pay

## 2023-02-25 NOTE — Progress Notes (Signed)
Surgical Instructions    Your procedure is scheduled on Tuesday March 10, 2023.  Report to [facility] Main Entrance "A" at 7:25 A.M., then check in with the Admitting office.  Call this number if you have problems the morning of surgery:  [phone]   If you have any questions prior to your surgery date call [phone]: Open Monday-Friday 8am-4pm If you experience any cold or flu symptoms such as cough, fever, chills, shortness of breath, etc. between now and your scheduled surgery, please notify us at the above number     Remember:  Do not eat or drink after midnight the night before your surgery   Take these medicines the morning of surgery with A SIP OF WATER:  atorvastatin (LIPITOR)  hydrALAZINE (APRESOLINE)   If needed:  REFRESH eye drops  (VISINE MAXIMUM REDNESS RELIEF OP)   Follow your surgeon's instructions on when to stop Aspirin.  If no instructions were given by your surgeon then you will need to call the office to get those instructions.    As of today, STOP taking (unless otherwise instructed by your surgeon) Aleve, Naproxen, Ibuprofen, Motrin, Advil, Goody's, BC's, all herbal medications, fish oil, and all vitamins.  Special instructions:    Oral Hygiene is also important to reduce your risk of infection.  Remember - BRUSH YOUR TEETH THE MORNING OF SURGERY WITH YOUR REGULAR TOOTHPASTE    Pre-operative 5 CHG Bath Instructions   You can play a key role in reducing the risk of infection after surgery. Your skin needs to be as free of germs as possible. You can reduce the number of germs on your skin by washing with CHG (chlorhexidine gluconate) soap before surgery. CHG is an antiseptic soap that kills germs and continues to kill germs even after washing.   DO NOT use if you have an allergy to chlorhexidine/CHG or antibacterial soaps. If your skin becomes reddened or irritated, stop using the CHG and notify one of our RNs at [phone].   Please shower with the  CHG soap starting 4 days before surgery using the following schedule:     Please keep in mind the following:  DO NOT shave, including legs and underarms, starting the day of your first shower.   You may shave your face at any point before/day of surgery.  Place clean sheets on your bed the day you start using CHG soap. Use a clean washcloth (not used since being washed) for each shower. DO NOT sleep with pets once you start using the CHG.   CHG Shower Instructions:  If you choose to wash your hair and private area, wash first with your normal shampoo/soap.  After you use shampoo/soap, rinse your hair and body thoroughly to remove shampoo/soap residue.  Turn the water OFF and apply about 3 tablespoons (45 ml) of CHG soap to a CLEAN washcloth.  Apply CHG soap ONLY FROM YOUR NECK DOWN TO YOUR TOES (washing for 3-5 minutes)  DO NOT use CHG soap on face, private areas, open wounds, or sores.  Pay special attention to the area where your surgery is being performed.  If you are having back surgery, having someone wash your back for you may be helpful. Wait 2 minutes after CHG soap is applied, then you may rinse off the CHG soap.  Pat dry with a clean towel  Put on clean clothes/pajamas   If you choose to wear lotion, please use ONLY the CHG-compatible lotions on the back of this paper.  Additional instructions for the day of surgery: DO NOT APPLY any lotions, deodorants, cologne, or perfumes.   Put on clean/comfortable clothes.  Brush your teeth.  Ask your nurse before applying any prescription medications to the skin.      CHG Compatible Lotions   Aveeno Moisturizing lotion  Cetaphil Moisturizing Cream  Cetaphil Moisturizing Lotion  Clairol Herbal Essence Moisturizing Lotion, Dry Skin  Clairol Herbal Essence Moisturizing Lotion, Extra Dry Skin  Clairol Herbal Essence Moisturizing Lotion, Normal Skin  Curel Age Defying Therapeutic Moisturizing Lotion with Alpha Hydroxy  Curel  Extreme Care Body Lotion  Curel Soothing Hands Moisturizing Hand Lotion  Curel Therapeutic Moisturizing Cream, Fragrance-Free  Curel Therapeutic Moisturizing Lotion, Fragrance-Free  Curel Therapeutic Moisturizing Lotion, Original Formula  Eucerin Daily Replenishing Lotion  Eucerin Dry Skin Therapy Plus Alpha Hydroxy Crme  Eucerin Dry Skin Therapy Plus Alpha Hydroxy Lotion  Eucerin Original Crme  Eucerin Original Lotion  Eucerin Plus Crme Eucerin Plus Lotion  Eucerin TriLipid Replenishing Lotion  Keri Anti-Bacterial Hand Lotion  Keri Deep Conditioning Original Lotion Dry Skin Formula Softly Scented  Keri Deep Conditioning Original Lotion, Fragrance Free Sensitive Skin Formula  Keri Lotion Fast Absorbing Fragrance Free Sensitive Skin Formula  Keri Lotion Fast Absorbing Softly Scented Dry Skin Formula  Keri Original Lotion  Keri Skin Renewal Lotion Keri Silky Smooth Lotion  Keri Silky Smooth Sensitive Skin Lotion  Nivea Body Creamy Conditioning Oil  Nivea Body Extra Enriched Teacher, adult education Moisturizing Lotion Nivea Crme  Nivea Skin Firming Lotion  NutraDerm 30 Skin Lotion  NutraDerm Skin Lotion  NutraDerm Therapeutic Skin Cream  NutraDerm Therapeutic Skin Lotion  ProShield Protective Hand Cream  Provon moisturizing lotion   Do not wear jewelry or makeup. Do not wear lotions, powders, perfumes/cologne or deodorant. Do not shave 48 hours prior to surgery.  Men may shave face and neck. Do not bring valuables to the hospital. Do not wear nail polish, gel polish, artificial nails, or any other type of covering on natural nails (fingers and toes) If you have artificial nails or gel coating that need to be removed by a nail salon, please have this removed prior to surgery. Artificial nails or gel coating may interfere with anesthesia's ability to adequately monitor your vital signs.  [facility] is not responsible for any belongings or  valuables.    Do NOT Smoke (Tobacco/Vaping)  24 hours prior to your procedure  If you use a CPAP at night, you may bring your mask for your overnight stay.   Contacts, glasses, hearing aids, dentures or partials may not be worn into surgery, please bring cases for these belongings   For patients admitted to the hospital, discharge time will be determined by your treatment team.   Patients discharged the day of surgery will not be allowed to drive home, and someone needs to stay with them for 24 hours.   SURGICAL WAITING ROOM VISITATION Patients having surgery or a procedure may have no more than 2 support people in the waiting area - these visitors may rotate.   Children under the age of 66 must have an adult with them who is not the patient. If the patient needs to stay at the hospital during part of their recovery, the visitor guidelines for inpatient rooms apply. Pre-op nurse will coordinate an appropriate time for 1 support person to accompany patient in pre-op.  This support person may not rotate.   Please refer to [email or web address] for the  visitor guidelines for Inpatients (after your surgery is over and you are in a regular room).   If you received a COVID test during your pre-op visit, it is requested that you wear a mask when out in public, stay away from anyone that may not be feeling well, and notify your surgeon if you develop symptoms. If you have been in contact with anyone that has tested positive in the last 10 days, please notify your surgeon.    Please read over the following fact sheets that you were given.

## 2023-02-26 ENCOUNTER — Encounter (HOSPITAL_COMMUNITY): Payer: Self-pay

## 2023-02-26 ENCOUNTER — Encounter (HOSPITAL_COMMUNITY)
Admission: RE | Admit: 2023-02-26 | Discharge: 2023-02-26 | Disposition: A | Discharge status: Home / Self Care | Payer: Medicare HMO | Source: Ambulatory Visit | Attending: Neurological Surgery | Admitting: Neurological Surgery

## 2023-02-26 ENCOUNTER — Other Ambulatory Visit: Payer: Self-pay

## 2023-02-26 VITALS — BP 137/59 | HR 69 | Temp 97.7°F | Resp 18 | Ht 68.0 in | Wt 195.5 lb

## 2023-02-26 DIAGNOSIS — I2510 Atherosclerotic heart disease of native coronary artery without angina pectoris: Secondary | ICD-10-CM | POA: Insufficient documentation

## 2023-02-26 DIAGNOSIS — Z01818 Encounter for other preprocedural examination: Secondary | ICD-10-CM | POA: Diagnosis present

## 2023-02-26 DIAGNOSIS — R748 Abnormal levels of other serum enzymes: Secondary | ICD-10-CM | POA: Insufficient documentation

## 2023-02-26 DIAGNOSIS — I10 Essential (primary) hypertension: Secondary | ICD-10-CM | POA: Insufficient documentation

## 2023-02-26 HISTORY — DX: Unspecified osteoarthritis, unspecified site: M19.90

## 2023-02-26 LAB — CBC
HCT: 44.1 % (ref 39.0–52.0)
Hemoglobin: 15.1 g/dL (ref 13.0–17.0)
MCH: 31.5 pg (ref 26.0–34.0)
MCHC: 34.2 g/dL (ref 30.0–36.0)
MCV: 91.9 fL (ref 80.0–100.0)
Platelets: 269 10*3/uL (ref 150–400)
RBC: 4.8 MIL/uL (ref 4.22–5.81)
RDW: 12.7 % (ref 11.5–15.5)
WBC: 9.3 10*3/uL (ref 4.0–10.5)
nRBC: 0 % (ref 0.0–0.2)

## 2023-02-26 LAB — COMPREHENSIVE METABOLIC PANEL
ALT: 33 U/L (ref 0–44)
AST: 27 U/L (ref 15–41)
Albumin: 4.2 g/dL (ref 3.5–5.0)
Alkaline Phosphatase: 70 U/L (ref 38–126)
Anion gap: 10 (ref 5–15)
BUN: 20 mg/dL (ref 8–23)
CO2: 26 mmol/L (ref 22–32)
Calcium: 9.8 mg/dL (ref 8.9–10.3)
Chloride: 103 mmol/L (ref 98–111)
Creatinine, Ser: 1.19 mg/dL (ref 0.61–1.24)
GFR, Estimated: >60 mL/min (ref >60)
Glucose, Bld: 99 mg/dL (ref 70–99)
Potassium: 4 mmol/L (ref 3.5–5.1)
Sodium: 139 mmol/L (ref 135–145)
Total Bilirubin: 0.9 mg/dL (ref 0.3–1.2)
Total Protein: 7.1 g/dL (ref 6.5–8.1)

## 2023-02-26 LAB — SURGICAL PCR SCREEN
MRSA, PCR: NEGATIVE
Staphylococcus aureus: NEGATIVE

## 2023-02-26 NOTE — Progress Notes (Signed)
PCP - Dr. Nilda Simmer Cardiologist - denies (Pt had A-flutter prior to ablation December 2020- no issues since) Neurologist- Dr. Lurena Joiner Tat    PPM/ICD - denies   Pt does have deep brain stimulator. He was instructed to bring remote  Chest x-ray - 12/11/21 EKG - 02/26/23 Stress Test - 06/22/15 ECHO - 07/19/19 Cardiac Cath - 12/11/21  Sleep Study - denies   DM- denies  Blood Thinner Instructions: n/a Aspirin Instructions: Pt states he was instructed to hold ASA and has not taken it since 01/27/23.  ERAS Protcol - no, NPO   COVID TEST- n/a   Anesthesia review: yes, cardiac hx (but no longer sees cardiology). Pt does see neurology.  Patient denies shortness of breath, fever, cough and chest pain at PAT appointment   All instructions explained to the patient, with a verbal understanding of the material. Patient agrees to go over the instructions while at home for a better understanding. The opportunity to ask questions was provided.

## 2023-02-27 NOTE — Anesthesia Preprocedure Evaluation (Addendum)
Anesthesia Evaluation  Patient identified by MRN, date of birth, ID band Patient awake    Reviewed: Allergy & Precautions, H&P , NPO status , Patient's Chart, lab work & pertinent test results  History of Anesthesia Complications (+) PONV and history of anesthetic complications  Airway Mallampati: II  TM Distance: >3 FB Neck ROM: Full    Dental no notable dental hx. (+) Teeth Intact, Dental Advisory Given   Pulmonary sleep apnea , former smoker   Pulmonary exam normal breath sounds clear to auscultation       Cardiovascular hypertension, Pt. on medications + dysrhythmias Atrial Fibrillation  Rhythm:Regular Rate:Normal     Neuro/Psych negative neurological ROS  negative psych ROS   GI/Hepatic Neg liver ROS,GERD  ,,  Endo/Other  negative endocrine ROS    Renal/GU negative Renal ROS  negative genitourinary   Musculoskeletal  (+) Arthritis , Osteoarthritis,    Abdominal   Peds  Hematology negative hematology ROS (+)   Anesthesia Other Findings   Reproductive/Obstetrics negative OB ROS                             Anesthesia Physical Anesthesia Plan  ASA: 3  Anesthesia Plan: General   Post-op Pain Management: Tylenol PO (pre-op)*   Induction: Intravenous  PONV Risk Score and Plan: 4 or greater and Ondansetron, Dexamethasone, Propofol infusion and TIVA  Airway Management Planned: Oral ETT  Additional Equipment:   Intra-op Plan:   Post-operative Plan: Extubation in OR  Informed Consent: I have reviewed the patients History and Physical, chart, labs and discussed the procedure including the risks, benefits and alternatives for the proposed anesthesia with the patient or authorized representative who has indicated his/her understanding and acceptance.     Dental advisory given  Plan Discussed with: CRNA  Anesthesia Plan Comments: (PAT note by Antionette Poles, PA-C:  History of  atrial flutter s/p successful ablation December 2020 at [facility].  Subsequent Holter monitor showed no significant arrhythmia.  Last seen by cardiology 01/07/22 for followup of recent evaluation of exertional dyspnea. Per note, "3-day Holter did not show any concerning arrhythmia. He underwent a nuclear stress test which was normal by perfusion imaging with an abnormal ECG response to exercise. He notably had a stress test in 2016 through [facility]. This showed normal perfusion imaging with an abnormal ECG response to exercise. Based on description, ECG findings do appear more prominent on his most recent stress test. He did also have a hypertensive response to exercise during his most recent study. He was initiated on Imdur during last evaluation. He then represented with chest discomfort. He ultimately underwent a left heart catheterization which revealed mild nonobstructive coronary artery disease. Since then, he has had no further chest discomfort. He continues to have shortness of breath particularly with exertion." He was recommended to continue Imdur, ASA 81 mg, and statin.   Follows with neurologist Dr. Arbutus Leas for history of essential tremor s/p bilateral VIM DBS in January, 2018. 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 Patient subsequently had a left VIM DBS on December 04, 2017 with IPG placement on Jan 12, 2018. His right IPG was changed out October 31, 2021. He was instructed to bring remote.   Preop labs reviewed, WNL.  EKG 02/26/23: NSR. Rate 69.  Left Heart Catheterization 12/11/21 Findings: Non-obstructive CAD LM normal Circumflex normal  LAD mid distal systolic bridging, distal branch  pruning RCA 30% ostial, prox and mid LVEDP  Nuclear Stress Test: 11/14/21  Myocardial perfusion imaging is Normal.  Abnormal ECG response to exercise with normal perfusion imaging  Overall left ventricular systolic function was Normal without regional  wall motion abnormalities (see above).  No prior study available for comparison.  Holter Monitor: 11/19/21 * The observed rhythms are sinus bradycardia to sinus tachycardia. * The Maximum Heart Rate recorded was 202 bpm, Day 2 / 09:49:59 pm, the Minimum Heart Rate recorded was 38 bpm, Day 2 / 05:08:58 am and the Average Heart Rate was 70 bpm. * There were 1685 PVCs with a burden of 0.98 %. There was 1 occurrence of Ventricular Tachycardia with the longest episode 3 beats, Day 2 / 03:00:37 am and the fastest episode 194 bpm, Day 2 / 03:00:37 am. * There were 2827 PSVCs with a burden of 1.64 %. There were 6 occurrences of Supraventricular Tachycardia with the longest episode 8 beats, Day 2 / 04:37:15 am and the fastest episode 202 bpm, Day 2 / 09:50:03 pm. * There were 0 Patient Triggered events. No sustained arrhythmia  Echocardiogram 07/19/19: MILD LV SYSTOLIC DYSFUNCTION (EF 50%) WITH MILD LVH NORMAL RIGHT VENTRICULAR SYSTOLIC FUNCTION MILD VALVULAR REGURGITATION (Mild MR) NO VALVULAR STENOSIS NO PERICARDIAL EFFUSION MILDLY ENLARGED LEFT ATRIUM MILDLY DILATED IVC NO PRIOR STUDY  Nuclear stress teat 2016:  Nuclear stress EF: 53%.   Blood pressure demonstrated a hypertensive response to exercise.  Baseline BP was elevated.   Horizontal ST segment depression ST segment depression was noted during  stress in the III, V5 and V6 leads, and returning to baseline after less  than 1 minute of recovery.   The study is normal.   This is a low risk study.   The left ventricular ejection fraction is mildly decreased (45-54%).  Normal study with no infarct and no ischemia.  There was baseline hypertensionwith hypertensive response to stress.    )        Anesthesia Quick Evaluation

## 2023-02-27 NOTE — Progress Notes (Addendum)
Anesthesia Chart Review:  History of atrial flutter s/p successful ablation December 2020 at [facility].  Subsequent Holter monitor showed no significant arrhythmia.  Last seen by cardiology 01/07/22 for followup of recent evaluation of exertional dyspnea. Per note, "3-day Holter did not show any concerning arrhythmia. He underwent a nuclear stress test which was normal by perfusion imaging with an abnormal ECG response to exercise. He notably had a stress test in 2016 through [facility] health. This showed normal perfusion imaging with an abnormal ECG response to exercise. Based on description, ECG findings do appear more prominent on his most recent stress test. He did also have a hypertensive response to exercise during his most recent study. He was initiated on Imdur during last evaluation. He then represented with chest discomfort. He ultimately underwent a left heart catheterization which revealed mild nonobstructive coronary artery disease. Since then, he has had no further chest discomfort. He continues to have shortness of breath particularly with exertion." He was recommended to continue Imdur, ASA 81 mg, and statin.   Follows with neurologist Dr. Arbutus Leas for history of essential tremor s/p bilateral VIM DBS in January, 2018. 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 Patient subsequently had a left VIM DBS on December 04, 2017 with IPG placement on Jan 12, 2018. His right IPG was changed out October 31, 2021. He was instructed to bring remote.    Preop labs reviewed, WNL.   EKG 02/26/23: NSR. Rate 69.   Left Heart Catheterization 12/11/21 Findings: Non-obstructive CAD LM normal Circumflex normal  LAD mid distal systolic bridging, distal branch pruning RCA 30% ostial, prox and mid LVEDP  Nuclear Stress Test: 11/14/21  Myocardial perfusion imaging is Normal.  Abnormal ECG response to exercise with normal perfusion imaging  Overall left ventricular systolic  function was Normal without regional wall motion abnormalities (see above).  No prior study available for comparison.  Holter Monitor: 11/19/21 * The observed rhythms are sinus bradycardia to sinus tachycardia. * The Maximum Heart Rate recorded was 202 bpm, Day 2 / 09:49:59 pm, the Minimum Heart Rate recorded was 38 bpm, Day 2 / 05:08:58 am and the Average Heart Rate was 70 bpm. * There were 1685 PVCs with a burden of 0.98 %. There was 1 occurrence of Ventricular Tachycardia with the longest episode 3 beats, Day 2 / 03:00:37 am and the fastest episode 194 bpm, Day 2 / 03:00:37 am. * There were 2827 PSVCs with a burden of 1.64 %. There were 6 occurrences of Supraventricular Tachycardia with the longest episode 8 beats, Day 2 / 04:37:15 am and the fastest episode 202 bpm, Day 2 / 09:50:03 pm. * There were 0 Patient Triggered events. No sustained arrhythmia  Echocardiogram 07/19/19: MILD LV SYSTOLIC DYSFUNCTION (EF 50%)   WITH MILD LVH NORMAL RIGHT VENTRICULAR SYSTOLIC FUNCTION MILD VALVULAR REGURGITATION (Mild MR) NO VALVULAR STENOSIS NO PERICARDIAL EFFUSION MILDLY ENLARGED LEFT ATRIUM MILDLY DILATED IVC NO PRIOR STUDY   Nuclear stress teat 2016:  Nuclear stress EF: 53%.   Blood pressure demonstrated a hypertensive response to exercise.  Baseline BP was elevated.   Horizontal ST segment depression ST segment depression was noted during  stress in the III, V5 and V6 leads, and returning to baseline after less  than 1 minute of recovery.   The study is normal.   This is a low risk study.   The left ventricular ejection fraction is mildly decreased (45-54%).  Normal study with  no infarct and no ischemia.  There was baseline hypertension with hypertensive response to stress.     Zannie Cove [facility] Short Stay Center/Anesthesiology Phone [phone] 02/27/2023 1:10 PM

## 2023-03-10 ENCOUNTER — Ambulatory Visit (HOSPITAL_COMMUNITY): Payer: Medicare HMO

## 2023-03-10 ENCOUNTER — Ambulatory Visit (HOSPITAL_COMMUNITY)
Admission: RE | Admit: 2023-03-10 | Discharge: 2023-03-10 | Disposition: A | Discharge status: Home / Self Care | Payer: Medicare HMO | Source: Ambulatory Visit | Attending: Neurological Surgery | Admitting: Neurological Surgery

## 2023-03-10 ENCOUNTER — Other Ambulatory Visit: Payer: Self-pay

## 2023-03-10 ENCOUNTER — Ambulatory Visit (HOSPITAL_COMMUNITY): Payer: Medicare HMO | Admitting: Physician Assistant

## 2023-03-10 ENCOUNTER — Ambulatory Visit (HOSPITAL_BASED_OUTPATIENT_CLINIC_OR_DEPARTMENT_OTHER): Payer: Medicare HMO | Admitting: Registered Nurse

## 2023-03-10 ENCOUNTER — Encounter (HOSPITAL_COMMUNITY)
Admission: RE | Disposition: A | Discharge status: Home / Self Care | Payer: Self-pay | Source: Ambulatory Visit | Attending: Neurological Surgery

## 2023-03-10 ENCOUNTER — Encounter (HOSPITAL_COMMUNITY): Payer: Self-pay | Admitting: Neurological Surgery

## 2023-03-10 DIAGNOSIS — G4730 Sleep apnea, unspecified: Secondary | ICD-10-CM | POA: Insufficient documentation

## 2023-03-10 DIAGNOSIS — I10 Essential (primary) hypertension: Secondary | ICD-10-CM

## 2023-03-10 DIAGNOSIS — Z87891 Personal history of nicotine dependence: Secondary | ICD-10-CM

## 2023-03-10 DIAGNOSIS — K219 Gastro-esophageal reflux disease without esophagitis: Secondary | ICD-10-CM | POA: Diagnosis not present

## 2023-03-10 DIAGNOSIS — Z09 Encounter for follow-up examination after completed treatment for conditions other than malignant neoplasm: Secondary | ICD-10-CM | POA: Diagnosis not present

## 2023-03-10 DIAGNOSIS — M5116 Intervertebral disc disorders with radiculopathy, lumbar region: Secondary | ICD-10-CM | POA: Insufficient documentation

## 2023-03-10 DIAGNOSIS — Z79899 Other long term (current) drug therapy: Secondary | ICD-10-CM | POA: Insufficient documentation

## 2023-03-10 DIAGNOSIS — M48061 Spinal stenosis, lumbar region without neurogenic claudication: Secondary | ICD-10-CM | POA: Diagnosis not present

## 2023-03-10 DIAGNOSIS — I4891 Unspecified atrial fibrillation: Secondary | ICD-10-CM

## 2023-03-10 HISTORY — PX: LUMBAR LAMINECTOMY/ DECOMPRESSION WITH MET-RX: SHX5959

## 2023-03-10 SURGERY — LUMBAR LAMINECTOMY/ DECOMPRESSION WITH MET-RX
Anesthesia: General | Laterality: Left

## 2023-03-10 MED ORDER — DEXAMETHASONE SODIUM PHOSPHATE 10 MG/ML IJ SOLN
INTRAMUSCULAR | Status: AC
  Filled 2023-03-10: qty 1

## 2023-03-10 MED ORDER — METHOCARBAMOL 750 MG PO TABS: 750.0000 mg | ORAL_TABLET | Freq: Three times a day (TID) | ORAL | 0 refills | Status: DC | PRN

## 2023-03-10 MED ORDER — BUPIVACAINE-EPINEPHRINE (PF) 0.25% -1:200000 IJ SOLN
INTRAMUSCULAR | Status: AC
  Filled 2023-03-10: qty 30

## 2023-03-10 MED ORDER — ROCURONIUM BROMIDE 10 MG/ML (PF) SYRINGE
PREFILLED_SYRINGE | INTRAVENOUS | Status: DC | PRN
  Administered 2023-03-10: 10 mg via INTRAVENOUS
  Administered 2023-03-10: 30 mg via INTRAVENOUS
  Administered 2023-03-10: 50 mg via INTRAVENOUS

## 2023-03-10 MED ORDER — CHLORHEXIDINE GLUCONATE CLOTH 2 % EX PADS: 6.0000 | MEDICATED_PAD | Freq: Once | CUTANEOUS | Status: DC

## 2023-03-10 MED ORDER — ORAL CARE MOUTH RINSE: 15.0000 mL | Freq: Once | OROMUCOSAL | Status: AC

## 2023-03-10 MED ORDER — 0.9 % SODIUM CHLORIDE (POUR BTL) OPTIME
TOPICAL | Status: DC | PRN
  Administered 2023-03-10: 1000 mL

## 2023-03-10 MED ORDER — CEFAZOLIN SODIUM-DEXTROSE 2-4 GM/100ML-% IV SOLN
2.0000 g | INTRAVENOUS | Status: AC
  Administered 2023-03-10: 2 g via INTRAVENOUS
  Filled 2023-03-10: qty 100

## 2023-03-10 MED ORDER — THROMBIN 5000 UNITS EX SOLR
OROMUCOSAL | Status: DC | PRN
  Administered 2023-03-10: 5 mL via TOPICAL

## 2023-03-10 MED ORDER — PROPOFOL 10 MG/ML IV BOLUS
INTRAVENOUS | Status: DC | PRN
  Administered 2023-03-10: 130 mg via INTRAVENOUS
  Administered 2023-03-10: 100 ug/kg/min via INTRAVENOUS

## 2023-03-10 MED ORDER — THROMBIN 5000 UNITS EX SOLR
CUTANEOUS | Status: AC
  Filled 2023-03-10: qty 5000

## 2023-03-10 MED ORDER — FENTANYL CITRATE (PF) 100 MCG/2ML IJ SOLN
INTRAMUSCULAR | Status: DC | PRN
  Administered 2023-03-10: 50 ug via INTRAVENOUS

## 2023-03-10 MED ORDER — ACETAMINOPHEN 500 MG PO TABS
1000.0000 mg | ORAL_TABLET | Freq: Once | ORAL | Status: AC
  Administered 2023-03-10: 1000 mg via ORAL
  Filled 2023-03-10: qty 2

## 2023-03-10 MED ORDER — LIDOCAINE-EPINEPHRINE (PF) 1 %-1:200000 IJ SOLN
INTRAMUSCULAR | Status: DC | PRN
  Administered 2023-03-10: 5 mL

## 2023-03-10 MED ORDER — LACTATED RINGERS IV SOLN: INTRAVENOUS | Status: DC

## 2023-03-10 MED ORDER — FENTANYL CITRATE (PF) 250 MCG/5ML IJ SOLN
INTRAMUSCULAR | Status: AC
  Filled 2023-03-10: qty 5

## 2023-03-10 MED ORDER — GLYCOPYRROLATE 0.2 MG/ML IJ SOLN
INTRAMUSCULAR | Status: DC | PRN
  Administered 2023-03-10 (×2): .1 mg via INTRAVENOUS

## 2023-03-10 MED ORDER — DEXAMETHASONE SODIUM PHOSPHATE 10 MG/ML IJ SOLN
INTRAMUSCULAR | Status: DC | PRN
  Administered 2023-03-10: 5 mg via INTRAVENOUS

## 2023-03-10 MED ORDER — CHLORHEXIDINE GLUCONATE 0.12 % MT SOLN
15.0000 mL | Freq: Once | OROMUCOSAL | Status: AC
  Administered 2023-03-10: 15 mL via OROMUCOSAL
  Filled 2023-03-10: qty 15

## 2023-03-10 MED ORDER — FENTANYL CITRATE (PF) 250 MCG/5ML IJ SOLN
INTRAMUSCULAR | Status: DC | PRN
  Administered 2023-03-10 (×3): 50 ug via INTRAVENOUS
  Administered 2023-03-10: 100 ug via INTRAVENOUS

## 2023-03-10 MED ORDER — HEMOSTATIC AGENTS (NO CHARGE) OPTIME
TOPICAL | Status: DC | PRN
  Administered 2023-03-10: 1 via TOPICAL

## 2023-03-10 MED ORDER — ONDANSETRON HCL 4 MG/2ML IJ SOLN
INTRAMUSCULAR | Status: AC
  Filled 2023-03-10: qty 2

## 2023-03-10 MED ORDER — LIDOCAINE 2% (20 MG/ML) 5 ML SYRINGE
INTRAMUSCULAR | Status: AC
  Filled 2023-03-10: qty 5

## 2023-03-10 MED ORDER — LIDOCAINE 2% (20 MG/ML) 5 ML SYRINGE
INTRAMUSCULAR | Status: DC | PRN
  Administered 2023-03-10: 60 mg via INTRAVENOUS

## 2023-03-10 MED ORDER — FENTANYL CITRATE (PF) 250 MCG/5ML IJ SOLN: INTRAMUSCULAR | Status: DC | PRN

## 2023-03-10 MED ORDER — LIDOCAINE-EPINEPHRINE 1 %-1:100000 IJ SOLN
INTRAMUSCULAR | Status: DC | PRN
  Administered 2023-03-10: 5 mL

## 2023-03-10 MED ORDER — LIDOCAINE-EPINEPHRINE 1 %-1:100000 IJ SOLN
INTRAMUSCULAR | Status: AC
  Filled 2023-03-10: qty 1

## 2023-03-10 MED ORDER — SUGAMMADEX SODIUM 200 MG/2ML IV SOLN
INTRAVENOUS | Status: DC | PRN
  Administered 2023-03-10: 200 mg via INTRAVENOUS

## 2023-03-10 MED ORDER — ROCURONIUM BROMIDE 10 MG/ML (PF) SYRINGE
PREFILLED_SYRINGE | INTRAVENOUS | Status: AC
  Filled 2023-03-10: qty 10

## 2023-03-10 MED ORDER — PHENYLEPHRINE 80 MCG/ML (10ML) SYRINGE FOR IV PUSH (FOR BLOOD PRESSURE SUPPORT)
PREFILLED_SYRINGE | INTRAVENOUS | Status: DC | PRN
  Administered 2023-03-10: 80 ug via INTRAVENOUS

## 2023-03-10 MED ORDER — ONDANSETRON HCL 4 MG/2ML IJ SOLN
INTRAMUSCULAR | Status: DC | PRN
  Administered 2023-03-10: 4 mg via INTRAVENOUS

## 2023-03-10 MED ORDER — PROPOFOL 10 MG/ML IV BOLUS
INTRAVENOUS | Status: AC
  Filled 2023-03-10: qty 20

## 2023-03-10 MED ORDER — FENTANYL CITRATE (PF) 100 MCG/2ML IJ SOLN
INTRAMUSCULAR | Status: AC
  Filled 2023-03-10: qty 2

## 2023-03-10 MED ORDER — METHYLPREDNISOLONE ACETATE 80 MG/ML IJ SUSP
INTRAMUSCULAR | Status: DC | PRN
  Administered 2023-03-10: 40 mg

## 2023-03-10 MED ORDER — HYDROCODONE-ACETAMINOPHEN 5-325 MG PO TABS: 1.0000 | ORAL_TABLET | ORAL | 0 refills | Status: DC | PRN

## 2023-03-10 MED ORDER — EPHEDRINE SULFATE-NACL 50-0.9 MG/10ML-% IV SOSY
PREFILLED_SYRINGE | INTRAVENOUS | Status: DC | PRN
  Administered 2023-03-10 (×4): 5 mg via INTRAVENOUS

## 2023-03-10 MED ORDER — METHYLPREDNISOLONE ACETATE 80 MG/ML IJ SUSP
INTRAMUSCULAR | Status: AC
  Filled 2023-03-10: qty 1

## 2023-03-10 SURGICAL SUPPLY — 64 items
ADH SKN CLS APL DERMABOND .7 (GAUZE/BANDAGES/DRESSINGS) ×1
BAG COUNTER SPONGE SURGICOUNT (BAG) ×1 IMPLANT
BAG SPNG CNTER NS LX DISP (BAG) ×1
BLADE SURG 11 STRL SS (BLADE) ×1 IMPLANT
BUR MATCHSTICK NEURO 3.0 LAGG (BURR) ×1 IMPLANT
CNTNR URN SCR LID CUP LEK RST (MISCELLANEOUS) IMPLANT
CONT SPEC 4OZ STRL OR WHT (MISCELLANEOUS)
COVER BACK TABLE 60X90IN (DRAPES) ×1 IMPLANT
COVER MAYO STAND STRL (DRAPES) ×1 IMPLANT
DERMABOND ADVANCED .7 DNX12 (GAUZE/BANDAGES/DRESSINGS) ×1 IMPLANT
DRAIN JACKSON RD 7FR 3/32 (WOUND CARE) IMPLANT
DRAPE C-ARM 42X72 X-RAY (DRAPES) ×1 IMPLANT
DRAPE LAPAROTOMY 100X72X124 (DRAPES) ×1 IMPLANT
DRAPE MICROSCOPE SLANT 54X150 (MISCELLANEOUS) ×1 IMPLANT
DRAPE SURG 17X23 STRL (DRAPES) ×1 IMPLANT
DRSG OPSITE 4X5.5 SM (GAUZE/BANDAGES/DRESSINGS) IMPLANT
DRSG OPSITE POSTOP 3X4 (GAUZE/BANDAGES/DRESSINGS) ×1 IMPLANT
DURAPREP 26ML APPLICATOR (WOUND CARE) ×1 IMPLANT
ELECT BLADE 6.5 EXT (BLADE) ×1 IMPLANT
ELECT BLADE INSULATED 6.5IN (ELECTROSURGICAL) ×1
ELECT COATED BLADE 2.86 ST (ELECTRODE) ×1 IMPLANT
ELECT REM PT RETURN 9FT ADLT (ELECTROSURGICAL) ×1
ELECTRODE BLDE INSULATED 6.5IN (ELECTROSURGICAL) ×1 IMPLANT
ELECTRODE REM PT RTRN 9FT ADLT (ELECTROSURGICAL) ×1 IMPLANT
FORCEPS BIPOLAR SPETZLER 8 1.0 (NEUROSURGERY SUPPLIES) IMPLANT
GAUZE 4X4 16PLY ~~LOC~~+RFID DBL (SPONGE) IMPLANT
GLOVE BIO SURGEON STRL SZ7 (GLOVE) ×1 IMPLANT
GLOVE BIOGEL PI IND STRL 7.5 (GLOVE) ×1 IMPLANT
GLOVE BIOGEL PI IND STRL 8 (GLOVE) ×2 IMPLANT
GLOVE ECLIPSE 8.0 STRL XLNG CF (GLOVE) ×2 IMPLANT
GOWN STRL REUS W/ TWL LRG LVL3 (GOWN DISPOSABLE) IMPLANT
GOWN STRL REUS W/ TWL XL LVL3 (GOWN DISPOSABLE) ×2 IMPLANT
GOWN STRL REUS W/TWL 2XL LVL3 (GOWN DISPOSABLE) IMPLANT
GOWN STRL REUS W/TWL LRG LVL3 (GOWN DISPOSABLE)
GOWN STRL REUS W/TWL XL LVL3 (GOWN DISPOSABLE) ×2
HEMOSTAT POWDER KIT SURGIFOAM (HEMOSTASIS) ×1 IMPLANT
KIT BASIN OR (CUSTOM PROCEDURE TRAY) ×1 IMPLANT
KIT POSITION SURG JACKSON T1 (MISCELLANEOUS) ×1 IMPLANT
KIT TURNOVER KIT B (KITS) ×1 IMPLANT
MARKER SKIN DUAL TIP RULER LAB (MISCELLANEOUS) ×1 IMPLANT
NDL HYPO 25X1 1.5 SAFETY (NEEDLE) ×1 IMPLANT
NDL SPNL 18GX3.5 QUINCKE PK (NEEDLE) ×1 IMPLANT
NEEDLE HYPO 25X1 1.5 SAFETY (NEEDLE) ×1 IMPLANT
NEEDLE SPNL 18GX3.5 QUINCKE PK (NEEDLE) ×1 IMPLANT
NS IRRIG 1000ML POUR BTL (IV SOLUTION) ×1 IMPLANT
PACK LAMINECTOMY NEURO (CUSTOM PROCEDURE TRAY) ×1 IMPLANT
PAD ARMBOARD 7.5X6 YLW CONV (MISCELLANEOUS) ×3 IMPLANT
PATTIES SURGICAL .5 X.5 (GAUZE/BANDAGES/DRESSINGS) IMPLANT
PATTIES SURGICAL .5 X1 (DISPOSABLE) IMPLANT
PATTIES SURGICAL 1X1 (DISPOSABLE) IMPLANT
SOL ELECTROSURG ANTI STICK (MISCELLANEOUS)
SOLUTION ELECTROSURG ANTI STCK (MISCELLANEOUS) ×1 IMPLANT
SPIKE FLUID TRANSFER (MISCELLANEOUS) ×1 IMPLANT
SPONGE SURGIFOAM ABS GEL 12-7 (HEMOSTASIS) ×1 IMPLANT
SPONGE T-LAP 4X18 ~~LOC~~+RFID (SPONGE) IMPLANT
STAPLER VISISTAT 35W (STAPLE) IMPLANT
SUT VIC AB 0 CT1 27 (SUTURE)
SUT VIC AB 0 CT1 27XBRD ANBCTR (SUTURE) IMPLANT
SUT VIC AB 2-0 CP2 18 (SUTURE) ×1 IMPLANT
SUT VIC AB 3-0 SH 8-18 (SUTURE) ×1 IMPLANT
TOWEL GREEN STERILE (TOWEL DISPOSABLE) IMPLANT
TOWEL GREEN STERILE FF (TOWEL DISPOSABLE) IMPLANT
TRAY FOLEY MTR SLVR 16FR STAT (SET/KITS/TRAYS/PACK) IMPLANT
WATER STERILE IRR 1000ML POUR (IV SOLUTION) ×1 IMPLANT

## 2023-03-10 NOTE — Anesthesia Procedure Notes (Signed)
Procedure Name: Intubation Date/Time: 03/10/2023 9:55 AM  Performed by: Carolynne Edouard, RNPre-anesthesia Checklist: Patient identified, Emergency Drugs available, Suction available and Patient being monitored Patient Re-evaluated:Patient Re-evaluated prior to induction Oxygen Delivery Method: Circle system utilized Preoxygenation: Pre-oxygenation with 100% oxygen Induction Type: IV induction Ventilation: Mask ventilation without difficulty Laryngoscope Size: Mac and 4 Grade View: Grade I Tube type: Oral Tube size: 7.5 mm Number of attempts: 1 Airway Equipment and Method: Stylet and Oral airway Placement Confirmation: ETT inserted through vocal cords under direct vision, positive ETCO2 and breath sounds checked- equal and bilateral Secured at: 22 cm Tube secured with: Tape Dental Injury: Teeth and Oropharynx as per pre-operative assessment

## 2023-03-10 NOTE — Anesthesia Postprocedure Evaluation (Signed)
Anesthesia Post Note  Patient: Carlos Hunt  Procedure(s) Performed: Minimally Invasive Surgery MICRODISCECTOMY, Lumbar three-four (Left)     Patient location during evaluation: PACU Anesthesia Type: General Level of consciousness: awake and alert Pain management: pain level controlled Vital Signs Assessment: post-procedure vital signs reviewed and stable Respiratory status: spontaneous breathing, nonlabored ventilation and respiratory function stable Cardiovascular status: blood pressure returned to baseline and stable Postop Assessment: no apparent nausea or vomiting Anesthetic complications: no  No notable events documented.  Last Vitals:  Vitals:   03/10/23 1200 03/10/23 1215  BP: 136/62 118/62  Pulse: 60 (!) 54  Resp: 16 11  Temp:  36.6 C  SpO2: 95% 98%    Last Pain:  Vitals:   03/10/23 1215  PainSc: 0-No pain                 Travanti Mcmanus,W. EDMOND

## 2023-03-10 NOTE — Transfer of Care (Signed)
Immediate Anesthesia Transfer of Care Note  Patient: Carlos Hunt  Procedure(s) Performed: Minimally Invasive Surgery MICRODISCECTOMY, Lumbar three-four (Left)  Patient Location: PACU  Anesthesia Type:General  Level of Consciousness: drowsy and patient cooperative  Airway & Oxygen Therapy: Patient Spontanous Breathing  Post-op Assessment: Report given to RN, Post -op Vital signs reviewed and stable, and Patient moving all extremities X 4  Post vital signs: Reviewed and stable  Last Vitals:  Vitals Value Taken Time  BP    Temp    Pulse 68 03/10/23 1145  Resp 18 03/10/23 1145  SpO2 99 % 03/10/23 1145  Vitals shown include unvalidated device data.  Last Pain:  Vitals:   03/10/23 0755  PainSc: 5          Complications: No notable events documented.

## 2023-03-10 NOTE — Op Note (Signed)
Providing Compassionate, Quality Care - Together  Date of service: 03/10/2023  PREOP DIAGNOSIS: Lumbar disc herniation, left L3-4 with radiculopathy  POSTOP DIAGNOSIS: Same  PROCEDURE: 1.  Left L3-4 minimally invasive laminectomy, partial facetectomy and microdiscectomy for decompression of nerve root L3, L4 2. Use of operating microscope 3. Use of intraoperative fluoroscopy  SURGEON: Dr. Kendell Bane C. Catrinia Racicot, DO  ASSISTANT: Patrici Ranks, PA  ANESTHESIA: General Endotracheal  EBL: 10 cc  SPECIMENS: None  DRAINS: None  COMPLICATIONS: None  CONDITION: Hemodynamically stable  HISTORY: Carlos Hunt is a 77 y.o. male with progressively worsening left lower extremity radiculopathy.  MRI revealed a L3-4 large left herniated nucleus pulposus with inferior migration causing severe compression of the L3 and L4 nerve roots.  He failed conservative measures therefore offered surgery in the form of an L3-4 left microdiscectomy.  All risks, benefits and expected outcomes were discussed and agreed upon.  Informed consent was obtained and witnessed.  PROCEDURE IN DETAIL: After informed consent was obtained and witnessed, the patient was brought to the operating room. After induction of general anesthesia, the patient was positioned on the operative table in the prone position with all pressure points meticulously padded. The skin of the low back was then prepped and draped in the usual sterile fashion. Physician driven timeout was performed.  Under fluoroscopy, the left L 3-4 level was identified and marked out on the skin, and after timeout was conducted. Skin incision was then made sharply with a 10 blade and Bovie electrocautery was used to dissect the subcutaneous tissue until the lumbodorsal fascia was identified. The fascia was then incised using Bovie electrocautery and the lamina at the L3-4 levels was identified and dissection was carried out in the subperiosteal plane using the Metrx  dilators.  An appropriate sized Metrx tube was placed, and intraoperative x-ray was taken to confirm appropriate placement.  The microscope was sterilely draped and brought into the field and used for the remainder of the case.  Using electrocautery, soft tissue was cleared from the lamina and medial facet.  Using a high-speed drill, laminotomy was completed with a partial medial facetectomy. The ligamentum flavum was then identified and removed and the lateral edge of the thecal sac was identified. This was then traced down to identify the traversing nerve root. Dissection was then carried out superior and lateral to the nerve root to identify the disc herniation.  There is significant adherence to the thecal sac, with a difficult plan to be identified in the ventral epidural space.  The posterior annulus was then coagulated with bipolar cautery and incised with an 11 blade and using a combination of dissectors, curettes, and ronguers, the herniated disc fragment was removed. The decompression of the nerve root was confirmed using a dissector.  There appeared to be some slight adherence along the ventral aspect of the L4 nerve root.  Hemostasis was then secured using a combination of morcellized Gelfoam and thrombin and bipolar electrocautery. The wound is irrigated with copious amounts of antibiotic saline irrigation.  The traversing and exiting nerve roots were felt with a Murphy ball probe and noted to be decompressed.  The thecal sac appeared intact and there was no CSF egress.  The wound was noted to be excellently hemostatic.  The nerve root was then covered with a long-acting steroid solution and fentanyl.  The Metrx tube was then removed, hemostasis was achieved with bipolar cautery and the wound is closed in layers using 2-0 Vicryl stitches. The skin  was closed using standard skin glue.  Sterile dressing was applied.  Drapes were taken down.  At the end of the case all sponge, needle, and instrument  counts were correct. The patient was then fully transferred to the stretcher, extubated and taken to the postanesthesia care unit in stable hemodynamic condition.

## 2023-03-10 NOTE — H&P (Signed)
Providing Compassionate, Quality Care - Together  NEUROSURGERY HISTORY & PHYSICAL   Carlos Hunt is an 77 y.o. male.   Chief Complaint: Left lower extremity radiculopathy  HPI: this is a pleasant 77 year old male with a history of worsening left lower extremity radiculopathy with pain at 9/10.  The pain radiates down to his anterior lateral thigh and shin.  His leg has been intermittently buckling at times, he failed conservative measures including PT.  MRI of the lumbar spine revealed a left lateral recess L3-4 herniated nucleus pulposus with severe lateral recess stenosis and nerve compression.  Past Medical History:  Diagnosis Date   Adenomatous polyps 04/02/2017   Arthritis    Brain abscess 03/11/2017   Dysrhythmia    A-flutter   Elevated liver enzymes    Erectile dysfunction    Essential hypertension, benign 02/14/2013   Essential tremor    GERD (gastroesophageal reflux disease)    Head injury    Hx of colonic polyps    Hyperlipidemia    Hypertension    New onset atrial flutter (HCC)    s/p ablation 08/2019   Pain in left knee    Pain, joint, shoulder    PONV (postoperative nausea and vomiting)    nausea no vomiting   Seasonal allergies    Sleep apnea    pt denies   Testicular hypofunction    Tobacco use disorder    Tremor    Unspecified vitamin D deficiency     Past Surgical History:  Procedure Laterality Date   COLONOSCOPY  09/09/2011   normal.  Previous polyps.  [name]. Repeat 5 years.   COLONOSCOPY N/A 07/18/2016   Procedure: COLONOSCOPY;  Surgeon: Scot Jun, MD;  Location: [facility] ENDOSCOPY;  Service: Endoscopy;  Laterality: N/A;   COLONOSCOPY N/A 10/10/2021   Procedure: COLONOSCOPY;  Surgeon: Jaynie Collins, DO;  Location: [facility] ENDOSCOPY;  Service: Gastroenterology;  Laterality: N/A;   DEEP BRAIN STIMULATOR PLACEMENT     HAND SURGERY Left 2011   trauma   LAPAROSCOPIC INGUINAL HERNIA WITH UMBILICAL HERNIA Right 07/15/2017   Procedure:  LAPAROSCOPIC INGUINAL HERNIA REPAIR, right with umbilical herniorrhaphy;  Surgeon: Lattie Haw, MD;  Location: [facility] ORS;  Service: General;  Laterality: Right;   MINOR PLACEMENT OF FIDUCIAL N/A 11/24/2017   Procedure: MINOR PLACEMENT OF FIDUCIAL;  Surgeon: Maeola Harman, MD;  Location: [facility] OR;  Service: Neurosurgery;  Laterality: N/A;  Fiducial placement   PULSE GENERATOR IMPLANT Bilateral 10/23/2016   Procedure: BILATERAL IMPLANTABLE PULSE GENERATOR;  Surgeon: Maeola Harman, MD;  Location: [facility] OR;  Service: Neurosurgery;  Laterality: Bilateral;  BILATERAL IMPLANTABLE PULSE GENERATOR   PULSE GENERATOR IMPLANT Left 01/12/2018   Procedure: UNILATERAL PULSE GENERATOR IMPLANT AND EXTENTIONS FOR DEEP BRAIN STIMULATOR;  Surgeon: Maeola Harman, MD;  Location: [facility] OR;  Service: Neurosurgery;  Laterality: Left;   PULSE GENERATOR IMPLANT Right 10/31/2021   Procedure: Change Implantable Pulse Generator battery;  Surgeon: Tarvaris Puglia, Alan Mulder, DO;  Location: [facility] OR;  Service: Neurosurgery;  Laterality: Right;   SUBTHALAMIC STIMULATOR INSERTION Bilateral 10/07/2016   Procedure: BILATERAL DEEP BRAIN STIMULATOR PLACEMENT;  Surgeon: Maeola Harman, MD;  Location: [facility] OR;  Service: Neurosurgery;  Laterality: Bilateral;  BILATERAL DEEP BRAIN STIMULATOR PLACEMENT   SUBTHALAMIC STIMULATOR INSERTION Left 12/04/2017   Procedure: Left Deep Brain Stimulator Placement;  Surgeon: Maeola Harman, MD;  Location: [facility] OR;  Service: Neurosurgery;  Laterality: Left;  Left deep brain stimulator placement   SUBTHALAMIC STIMULATOR REMOVAL Left 03/10/2017   Procedure:  REMOVAL DEEP BRAIN STIMULATOR AND ASPIRATION OF BRAIN ABSCESS;  Surgeon: Julio Sicks, MD;  Location: [facility] OR;  Service: Neurosurgery;  Laterality: Left;   UMBILICAL HERNIA REPAIR N/A 07/15/2017   Procedure: HERNIA REPAIR UMBILICAL ADULT;  Surgeon: Lattie Haw, MD;  Location: [facility] ORS;  Service: General;  Laterality: N/A;    Family History  Problem Relation Age of Onset    Dementia Mother    Alzheimer's disease Mother    Parkinson's disease Father    Cancer Father 71       prostate   Heart disease Father 85       CAD/CABG   Hypertension Father    Hyperlipidemia Father    Parkinsonism Father    Coronary artery disease Father    Heart attack Father    Colon cancer Father    Alzheimer's disease Sister    Diabetes Sister    Parkinson's disease Brother    Coronary artery disease Brother    Social History:  reports that he quit smoking about 52 years ago. His smoking use included cigarettes. He has a 30.00 pack-year smoking history. He has never used smokeless tobacco. He reports current alcohol use of about 1.0 standard drink of alcohol per week. He reports that he does not use drugs.  Allergies:  Allergies  Allergen Reactions   Amlodipine     Other reaction(s): Headache   Procaine Nausea And Vomiting and Other (See Comments)    NOVOCAINE- lidocaine ok per patient   Sulfacetamide Sodium-Sulfur Rash    Medications Prior to Admission  Medication Sig Dispense Refill   Ascorbic Acid (VITAMIN C) 500 MG CAPS Take 500 mg by mouth in the morning.     atorvastatin (LIPITOR) 40 MG tablet Take 40 mg by mouth daily.     Cholecalciferol (VITAMIN D3) 2000 units TABS Take 2,000 Units by mouth in the morning.     Cyanocobalamin (B-12) 3000 MCG CAPS Take 4,000 mcg by mouth in the morning.     Digestive Enzymes (PAPAYA AND ENZYMES PO) Take 1 tablet by mouth daily as needed (for heartburn/indigestion).     hydrALAZINE (APRESOLINE) 10 MG tablet Take 10 mg by mouth 3 (three) times daily.     hydrochlorothiazide (HYDRODIURIL) 25 MG tablet Take 1 tablet (25 mg total) by mouth daily. 90 tablet 3   isosorbide mononitrate (IMDUR) 60 MG 24 hr tablet Take 60 mg by mouth daily.     metroNIDAZOLE (METROGEL) 1 % gel Apply 1 application topically daily as needed (rosacea).     Multiple Vitamin (MULTIVITAMIN WITH MINERALS) TABS tablet Take 1 tablet by mouth in the morning. Centrum  for Men     Polyvinyl Alcohol-Povidone PF (REFRESH) 1.4-0.6 % SOLN Place 1-2 drops into both eyes 3 (three) times daily as needed (dry/irritated eyes.).     sodium chloride (OCEAN) 0.65 % nasal spray Place 1 spray into the nose at bedtime.     Tetrahydroz-Glyc-Hyprom-PEG (VISINE MAXIMUM REDNESS RELIEF OP) Place 1 drop into both eyes daily as needed (for eye irritation).     valsartan (DIOVAN) 320 MG tablet Take 320 mg by mouth in the morning.     aspirin EC 81 MG tablet Take 1 tablet (81 mg total) by mouth daily. Swallow whole. 30 tablet 11   Multiple Vitamins-Minerals (AIRBORNE) CHEW Chew 1 tablet by mouth in the morning.     Omega-3 Fatty Acids (OMEGA-3 EPA FISH OIL PO) Take 2 capsules by mouth in the morning. (Patient not taking: Reported on 09/09/2022)  research study medication Take 1 each by mouth daily. Atorvastatin Calcium (INVESTIGATIONAL ATORVASTATIN/PLACEBO) 40 MG-PRagmatic EValuation of evENTs And Benefits of Lipid-lowering in oldEr adults (All Site sIRB) by [facility]      No results found for this or any previous visit (from the past 48 hour(s)). No results found.  ROS All pertinent positives and negatives are listed in HPI above  Blood pressure (!) 147/65, pulse 60, temperature 97.7 F (36.5 C), resp. rate 18, height 5\' 8"  (1.727 m), weight 83.9 kg, SpO2 99 %. Physical Exam  Awake alert oriented x 3, no acute distress PERRLA Cranial nerves II through XII intact, speech fluent appropriate Nonlabored breathing Full strength upper extremities, symmetric Bilateral lower extremities full strength except for left knee extensors 3/5 Decreased sensation to light touch in the L3 and L4 distributions on the left lower extremity  Assessment/Plan 77 year old male with  Left L3-4 herniated nucleus pulposus with radiculopathy  -OR today for left L3-4 microdiscectomy.  We discussed all risks, benefits and expected outcomes as well as alternatives to treatment.  Informed consent was  obtained and witnessed.  I answered all of his questions.  Thank you for allowing me to participate in this patient's care.  Please do not hesitate to call with questions or concerns.   Monia Pouch, DO Neurosurgeon [facility] Cell: [phone]

## 2023-03-11 ENCOUNTER — Encounter (HOSPITAL_COMMUNITY): Payer: Self-pay | Admitting: Neurological Surgery

## 2023-05-13 NOTE — Progress Notes (Unsigned)
Assessment/Plan:    1.  Essential Tremor  -Status post bilateral VIM DBS in January, 2018.  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  Patient subsequently had a left VIM DBS on December 04, 2017 with IPG placement on Jan 12, 2018.  His right IPG was changed out October 31, 2021.  His left IPG is likely going to need to be changed within the next 6 to 7 months.  2.  Low back pain  -Status post L3-L4 laminectomy with Dr. Jake Samples in July, 2024.  Patient is happy with the results and plans to go back to work on Monday.  3.  Bradycardia  -Patient states that primary care is aware and following.  He had significant bradycardia in the past, but it was due to metoprolol.  He is not on this currently.  Needs to make sure he is making primary care aware.  I did not see bradycardia listed in her March, 2020 for wellness visit.  Subjective:   Carlos Hunt was seen today in follow up for essential tremor.  Records reviewed.  Patient doing well since our last visit in terms of tremor.  Notes are reviewed from other physicians.  Much has happened since last visit.  The patient had back surgery with Dr. Jake Samples on July 2.  He had L3-L4 laminectomy.  I got postop notes from Dr. Jake Samples from July 22 and he reported that the patient was doing very well and virtually pain-free.  Patient reports today that "its a lot better than it was."  He has no cane now.  He plans to get back to work on Monday.    Likewise, patient continues to do well from a tremor standpoint.  Current prescribed movement disorder medications: n/a   ALLERGIES:   Allergies  Allergen Reactions   Amlodipine     Other reaction(s): Headache   Procaine Nausea And Vomiting and Other (See Comments)    NOVOCAINE- lidocaine ok per patient   Sulfacetamide Sodium-Sulfur Rash    CURRENT MEDICATIONS:  Outpatient Encounter Medications as of 05/14/2023  Medication Sig   Ascorbic Acid (VITAMIN C) 500 MG  CAPS Take 500 mg by mouth in the morning.   aspirin EC 81 MG tablet Take 1 tablet (81 mg total) by mouth daily. Swallow whole.   atorvastatin (LIPITOR) 40 MG tablet Take 40 mg by mouth daily.   Cholecalciferol (VITAMIN D3) 2000 units TABS Take 2,000 Units by mouth in the morning.   Cyanocobalamin (B-12) 3000 MCG CAPS Take 4,000 mcg by mouth in the morning.   Digestive Enzymes (PAPAYA AND ENZYMES PO) Take 1 tablet by mouth daily as needed (for heartburn/indigestion).   hydrALAZINE (APRESOLINE) 10 MG tablet Take 10 mg by mouth 3 (three) times daily.   hydrochlorothiazide (HYDRODIURIL) 25 MG tablet Take 1 tablet (25 mg total) by mouth daily.   HYDROcodone-acetaminophen (NORCO/VICODIN) 5-325 MG tablet Take 1 tablet by mouth every 4 (four) hours as needed for moderate pain.   isosorbide mononitrate (IMDUR) 60 MG 24 hr tablet Take 60 mg by mouth daily.   methocarbamol (ROBAXIN-750) 750 MG tablet Take 1 tablet (750 mg total) by mouth every 8 (eight) hours as needed for muscle spasms.   metroNIDAZOLE (METROGEL) 1 % gel Apply 1 application topically daily as needed (rosacea).   Multiple Vitamin (MULTIVITAMIN WITH MINERALS) TABS tablet Take 1 tablet by mouth in the morning. Centrum for Men   Multiple Vitamins-Minerals (AIRBORNE)  CHEW Chew 1 tablet by mouth in the morning.   Omega-3 Fatty Acids (OMEGA-3 EPA FISH OIL PO) Take 2 capsules by mouth in the morning.   Polyvinyl Alcohol-Povidone PF (REFRESH) 1.4-0.6 % SOLN Place 1-2 drops into both eyes 3 (three) times daily as needed (dry/irritated eyes.).   research study medication Take 1 each by mouth daily. Atorvastatin Calcium (INVESTIGATIONAL ATORVASTATIN/PLACEBO) 40 MG-PRagmatic EValuation of evENTs And Benefits of Lipid-lowering in oldEr adults (All Site sIRB) by [facility]   sodium chloride (OCEAN) 0.65 % nasal spray Place 1 spray into the nose at bedtime.   Tetrahydroz-Glyc-Hyprom-PEG (VISINE MAXIMUM REDNESS RELIEF OP) Place 1 drop into both eyes daily as  needed (for eye irritation).   valsartan (DIOVAN) 320 MG tablet Take 320 mg by mouth in the morning.   No facility-administered encounter medications on file as of 05/14/2023.     Objective:    PHYSICAL EXAMINATION:    VITALS:   Vitals:   05/14/23 1512  BP: (!) 142/60  Pulse: (!) 48  SpO2: 99%  Weight: 194 lb (88 kg)  Height: 5\' 10"  (1.778 m)        GEN:  The patient appears stated age and is in NAD. HEENT:  Normocephalic, atraumatic.  The mucous membranes are moist. The superficial temporal arteries are without ropiness or tenderness. Cv:  Brady.  Regular Lungs:  ctab  Neurological examination:  Orientation: The patient is alert and oriented x3. Cranial nerves: There is good facial symmetry. The speech is fluent and clear. Soft palate rises symmetrically and there is no tongue deviation. Hearing is intact to conversational tone. Sensation: Sensation is intact to light touch throughout Motor: Strength is at least antigravity x4.  Movement examination: Tone: There is normal tone in the UE/LE Abnormal movements: There is no rest tremor.  There is no postural or intention tremor. Coordination:  There is no decremation with RAM's Gait and Station: The patient ambulates well in the hall today.  No cane. I have reviewed and interpreted the following labs independently   Chemistry      Component Value Date/Time   NA 139 02/26/2023 1430   NA 142 12/07/2017 1225   K 4.0 02/26/2023 1430   CL 103 02/26/2023 1430   CO2 26 02/26/2023 1430   BUN 20 02/26/2023 1430   BUN 15 12/07/2017 1225   CREATININE 1.19 02/26/2023 1430   CREATININE 1.11 06/03/2016 1652      Component Value Date/Time   CALCIUM 9.8 02/26/2023 1430   ALKPHOS 70 02/26/2023 1430   AST 27 02/26/2023 1430   ALT 33 02/26/2023 1430   BILITOT 0.9 02/26/2023 1430   BILITOT 0.3 12/07/2017 1225     Total time spent on today's visit was 30 minutes, including both face-to-face time and nonface-to-face time.   Time included that spent on review of records (prior notes available to me/labs/imaging if pertinent), discussing treatment and goals, answering patient's questions and coordinating care.  DBS time is reported separate.    Cc:  Ethelda Chick, MD

## 2023-05-14 ENCOUNTER — Ambulatory Visit: Payer: Medicare HMO | Admitting: Neurology

## 2023-05-14 VITALS — BP 142/60 | HR 48 | Ht 70.0 in | Wt 194.0 lb

## 2023-05-14 DIAGNOSIS — M545 Low back pain, unspecified: Secondary | ICD-10-CM | POA: Diagnosis not present

## 2023-05-14 DIAGNOSIS — R001 Bradycardia, unspecified: Secondary | ICD-10-CM

## 2023-05-14 DIAGNOSIS — Z9689 Presence of other specified functional implants: Secondary | ICD-10-CM | POA: Diagnosis not present

## 2023-05-14 DIAGNOSIS — G250 Essential tremor: Secondary | ICD-10-CM | POA: Diagnosis not present

## 2023-05-14 NOTE — Procedures (Signed)
DBS Programming was performed.    Manufacturer of DBS device: [employer]tronic  Total time spent programming was 10 minutes.  Device was confirmed to be on.  Soft start was confirmed to be on.  Impedences were checked and were within normal limits.  Battery was checked and was near the end of life on the right.  Final settings were as follows:   Active Contact Amplitude (mA) PW (ms) Frequency (hz) Side Effects Battery  Left Brain        04/10/20 0-1+ 2.2 90 150  2.96  04/10/21 0-1+ 2.2 90 150  2.93  06/27/21 0-1+ 2.2 90 150  2.92  10/15/21 0-1+ 2.2 90 150  2.90  12/30/21 0-1+ 2.2 90 150  2.89  09/09/22 0-1+ 2.2 90 150  2.84  05/14/23 0-1+ 2.2(1.8-2.2) 90 150  2.72                  Right Brain        04/10/20 2-1+ 2.7 90 150  2.91  04/10/21 2-1+ 2.7 90 150  2.80  06/27/21 2-1+ 2.7 90 150  2.75  10/15/21 2-1+ 2.5 90 140  2.58  12/30/21 2-1+ 2.7 90 140  3.00  09/09/22 2-1+ 2.7 90 140  2.96  05/14/23 2-1+ 2.7 (2.1-2.9) 90 140  2.96

## 2023-05-17 ENCOUNTER — Emergency Department
Admission: EM | Admit: 2023-05-17 | Discharge: 2023-05-17 | Disposition: A | Discharge status: Home / Self Care | Payer: Medicare HMO | Attending: Emergency Medicine | Admitting: Emergency Medicine

## 2023-05-17 ENCOUNTER — Emergency Department: Payer: Medicare HMO

## 2023-05-17 ENCOUNTER — Other Ambulatory Visit: Payer: Self-pay

## 2023-05-17 DIAGNOSIS — S4991XA Unspecified injury of right shoulder and upper arm, initial encounter: Secondary | ICD-10-CM | POA: Diagnosis present

## 2023-05-17 DIAGNOSIS — W010XXA Fall on same level from slipping, tripping and stumbling without subsequent striking against object, initial encounter: Secondary | ICD-10-CM | POA: Insufficient documentation

## 2023-05-17 DIAGNOSIS — I10 Essential (primary) hypertension: Secondary | ICD-10-CM | POA: Diagnosis not present

## 2023-05-17 DIAGNOSIS — S43014A Anterior dislocation of right humerus, initial encounter: Secondary | ICD-10-CM | POA: Insufficient documentation

## 2023-05-17 MED ORDER — HYDROCODONE-ACETAMINOPHEN 5-325 MG PO TABS: 1.0000 | ORAL_TABLET | ORAL | 0 refills | Status: DC | PRN

## 2023-05-17 MED ORDER — SODIUM CHLORIDE 0.9 % IV BOLUS
1000.0000 mL | Freq: Once | INTRAVENOUS | Status: AC
  Administered 2023-05-17: 1000 mL via INTRAVENOUS

## 2023-05-17 MED ORDER — ONDANSETRON HCL 4 MG/2ML IJ SOLN
4.0000 mg | Freq: Once | INTRAMUSCULAR | Status: AC
  Administered 2023-05-17: 4 mg via INTRAVENOUS
  Filled 2023-05-17: qty 2

## 2023-05-17 MED ORDER — MORPHINE SULFATE (PF) 4 MG/ML IV SOLN
4.0000 mg | Freq: Once | INTRAVENOUS | Status: AC
  Administered 2023-05-17: 4 mg via INTRAVENOUS
  Filled 2023-05-17: qty 1

## 2023-05-17 MED ORDER — PROPOFOL 10 MG/ML IV BOLUS
INTRAVENOUS | Status: AC | PRN
  Administered 2023-05-17 (×3): 42.4 mg via INTRAVENOUS

## 2023-05-17 MED ORDER — PROPOFOL 10 MG/ML IV BOLUS
1.0000 mg/kg | Freq: Once | INTRAVENOUS | Status: DC
  Filled 2023-05-17: qty 20

## 2023-05-17 NOTE — ED Notes (Signed)
This RN gave brandy an updated report of pt

## 2023-05-17 NOTE — ED Provider Triage Note (Signed)
Emergency Medicine Provider Triage Evaluation Note  Carlos Hunt , a 77 y.o. male  was evaluated in triage.  Pt complains of trip and fall onto right shoulder. No HS or LOC. No previous dislocations.  Review of Systems  Positive: Right shoulder pain Negative: headache  Physical Exam  BP (!) 114/55 (BP Location: Left Arm)   Pulse (!) 43   Resp 18   Ht 5\' 8"  (1.727 m)   Wt 84.8 kg   SpO2 95%   BMI 28.43 kg/m  Gen:   Awake, uncomfortable Resp:  Normal effort  MSK:   Right shoulder deformity, sensation intact over deltoid, normal radial pulse Other:    Medical Decision Making  Medically screening exam initiated at 12:41 PM.  Appropriate orders placed.  [place] A Herman was informed that the remainder of the evaluation will be completed by another provider, this initial triage assessment does not replace that evaluation, and the importance of remaining in the ED until their evaluation is complete.     Jackelyn Hoehn, PA-C 05/17/23 1242

## 2023-05-17 NOTE — ED Triage Notes (Signed)
Patient reports dislocated right shoulder when he tripped and fell this morning. Patient denies head injury or loc. No blood thinners

## 2023-05-17 NOTE — ED Provider Notes (Signed)
[facility] Provider Note   Event Date/Time   First MD Initiated Contact with Patient 05/17/23 1505     (approximate) History  Fall (Patient reports dislocated right shoulder when he tripped and fell this morning. Patient denies head injury or loc. No blood thinners)  HPI Carlos Hunt is a 77 y.o. male with past medical history of hypertension, hyperlipidemia, and GERD who presents after a mechanical fall onto the right shoulder.  Patient states that he has had severe pain that is 8/10 in this right shoulder since this occurred.  There is also an obvious deformity at the right shoulder.  Patient states that he "feels like I dislocated it".  Patient states that sensation is intact in this right upper extremity but he has limited strength due to pain.  Patient denies any head trauma/loss of consciousness.  Patient denies any blood thinner use ROS: Patient currently denies any vision changes, tinnitus, difficulty speaking, facial droop, sore throat, chest pain, shortness of breath, abdominal pain, nausea/vomiting/diarrhea, dysuria, or weakness/numbness/paresthesias in any extremity   Physical Exam  Triage Vital Signs: ED Triage Vitals  Encounter Vitals Group     BP 05/17/23 1235 (!) 114/55     Systolic BP Percentile --      Diastolic BP Percentile --      Pulse Rate 05/17/23 1235 (!) 43     Resp 05/17/23 1235 18     Temp 05/17/23 1358 97.7 F (36.5 C)     Temp Source 05/17/23 1358 Oral     SpO2 05/17/23 1235 95 %     Weight 05/17/23 1236 187 lb (84.8 kg)     Height 05/17/23 1236 5\' 8"  (1.727 m)     Head Circumference --      Peak Flow --      Pain Score 05/17/23 1236 10     Pain Loc --      Pain Education --      Exclude from Growth Chart --    Most recent vital signs: Vitals:   05/17/23 1600 05/17/23 1615  BP: (!) 115/55 126/65  Pulse: 62 68  Resp: 16 19  Temp:    SpO2: 97% 94%   General: Awake, oriented x4. CV:  Good peripheral perfusion.   Resp:  Normal effort.  Abd:  No distention.  Other:  Elderly overweight Caucasian male resting comfortably in no acute distress.  Obvious trauma forming to the right shoulder, distally neurovascularly intact.  Limited range of motion of the right shoulder secondary to pain ED Results / Procedures / Treatments  Labs (all labs ordered are listed, but only abnormal results are displayed) Labs Reviewed - No data to display  RADIOLOGY ED MD interpretation: 2 view shoulder x-ray interpreted independently by me and shows an anterior dislocation of the right shoulder  Repeat two-view shoulder x-ray interpreted independently by me shows adequate dislocation reduction of the right shoulder -Agree with radiology assessment Official radiology report(s): DG Shoulder Right  Result Date: 05/17/2023 CLINICAL DATA:  Status post fall. EXAM: RIGHT SHOULDER - 2+ VIEW COMPARISON:  None Available. FINDINGS: There is anterior dislocation of the right shoulder. No definite fracture is seen. Osteoarthritic changes about the glenoid. IMPRESSION: Anterior dislocation of the right shoulder. Electronically Signed   By: Ted Mcalpine M.D.   On: 05/17/2023 13:23   PROCEDURES: Critical Care performed: No .1-3 Lead EKG Interpretation  Performed by: Merwyn Katos, MD Authorized by: Merwyn Katos, MD     Interpretation:  normal     ECG rate:  71   ECG rate assessment: normal     Rhythm: sinus rhythm     Ectopy: none     Conduction: normal   .Ortho Injury Treatment  Date/Time: 05/17/2023 4:35 PM  Performed by: Merwyn Katos, MD Authorized by: Merwyn Katos, MD   Consent:    Consent obtained:  Written   Risks discussed:  Fracture, nerve damage, restricted joint movement, vascular damage and stiffness   Alternatives discussed:  No treatment, alternative treatment, immobilization, referral and delayed treatmentInjury location: shoulder Location details: right shoulder Injury type:  dislocation Dislocation type: anterior Hill-Sachs deformity: no Chronicity: new Pre-procedure neurovascular assessment: neurovascularly intact Pre-procedure distal perfusion: normal Pre-procedure neurological function: normal Pre-procedure range of motion: reduced  Anesthesia: Local anesthesia used: no  Patient sedated: Yes. Refer to sedation procedure documentation for details of sedation. Manipulation performed: yes Reduction method: external rotation Reduction successful: yes X-ray confirmed reduction: yes Immobilization: sling Splint Applied by: ED Provider Post-procedure neurovascular assessment: post-procedure neurovascularly intact Post-procedure distal perfusion: normal Post-procedure neurological function: normal Post-procedure range of motion: normal   .Sedation  Date/Time: 05/17/2023 4:36 PM  Performed by: Merwyn Katos, MD Authorized by: Merwyn Katos, MD   Consent:    Consent obtained:  Written   Risks discussed:  Allergic reaction, prolonged hypoxia resulting in organ damage, dysrhythmia, prolonged sedation necessitating reversal, inadequate sedation, respiratory compromise necessitating ventilatory assistance and intubation, nausea and vomiting   Alternatives discussed:  Analgesia without sedation, anxiolysis and regional anesthesia Universal protocol:    Immediately prior to procedure, a time out was called: yes     Patient identity confirmed:  Verbally with patient Indications:    Procedure necessitating sedation performed by:  Physician performing sedation Pre-sedation assessment:    Time since last food or drink:  Na   NPO status caution: urgency dictates proceeding with non-ideal NPO status     ASA classification: class 2 - patient with mild systemic disease     Mouth opening:  2 finger widths   Thyromental distance:  3 finger widths   Mallampati score:  II - soft palate, uvula, fauces visible   Neck mobility: normal     Pre-sedation assessments  completed and reviewed: airway patency, cardiovascular function, hydration status, mental status, nausea/vomiting, pain level, respiratory function and temperature   Immediate pre-procedure details:    Reassessment: Patient reassessed immediately prior to procedure     Reviewed: vital signs, relevant labs/tests and NPO status     Verified: bag valve mask available, emergency equipment available, intubation equipment available, IV patency confirmed, oxygen available, reversal medications available and suction available   Procedure details (see MAR for exact dosages):    Preoxygenation:  Nasal cannula   Sedation:  Propofol   Intended level of sedation: deep   Analgesia:  Morphine   Intra-procedure monitoring:  Blood pressure monitoring, continuous capnometry, frequent LOC assessments, frequent vital sign checks, continuous pulse oximetry and cardiac monitor   Intra-procedure events: none     Total Provider sedation time (minutes):  10 Post-procedure details:    Attendance: Constant attendance by certified staff until patient recovered     Recovery: Patient returned to pre-procedure baseline     Post-sedation assessments completed and reviewed: airway patency, cardiovascular function, hydration status, mental status, nausea/vomiting, pain level, respiratory function and temperature     Patient is stable for discharge or admission: yes     Procedure completion:  Tolerated well, no immediate complications  MEDICATIONS ORDERED IN ED: Medications  propofol (DIPRIVAN) 10 mg/mL bolus/IV push 84.8 mg (0 mg Intravenous Hold 05/17/23 1622)  sodium chloride 0.9 % bolus 1,000 mL (1,000 mLs Intravenous New Bag/Given 05/17/23 1517)  morphine (PF) 4 MG/ML injection 4 mg (4 mg Intravenous Given 05/17/23 1516)  ondansetron (ZOFRAN) injection 4 mg (4 mg Intravenous Given 05/17/23 1516)  propofol (DIPRIVAN) 10 mg/mL bolus/IV push (42.4 mg Intravenous Given 05/17/23 1554)   IMPRESSION / MDM / ASSESSMENT AND PLAN / ED  COURSE  I reviewed the triage vital signs and the nursing notes.                             The patient is on the cardiac monitor to evaluate for evidence of arrhythmia and/or significant heart rate changes. Patient's presentation is most consistent with acute presentation with potential threat to life or bodily function. Patient is a 77 year old male who presents after a mechanical fall with an obvious deformity to the right shoulder Workup: XR Shoulder Findings: Dislocation  Patient does not currently demonstrate complications of dislocation such as compartment syndrome, arterial injury or nerve injury. The dislocation has been satisfactorily reduced and immobilized, and the patient has been given appropriate analgesia. Rx: sling immobilization 1 week, with gentle ROM to follow Disposition: Discharge with strict return precautions and instructions to follow up with primary MD within 48 hours for further evaluation including referral to an orthopedist.   FINAL CLINICAL IMPRESSION(S) / ED DIAGNOSES   Final diagnoses:  Anterior dislocation of right shoulder, initial encounter   Rx / DC Orders   ED Discharge Orders          Ordered    HYDROcodone-acetaminophen (NORCO) 5-325 MG tablet  Every 4 hours PRN        05/17/23 1630           Note:  This document was prepared using Dragon voice recognition software and may include unintentional dictation errors.   Merwyn Katos, MD 05/17/23 [phone]

## 2023-05-17 NOTE — ED Notes (Signed)
Prior to sedation, consent was for sedation but not typed in. Patient and family gave consent for sedation. Dr. Vicente Males, this RN and Oneita Hurt, RN at bedside.

## 2023-05-17 NOTE — Sedation Documentation (Signed)
Pt has been up talking for the last 15 minutes. Family at bedside. Repeat x-rays completed.

## 2023-05-17 NOTE — Sedation Documentation (Addendum)
sling placed on right shoulder/arm

## 2023-05-17 NOTE — ED Notes (Signed)
Brandy, EMT witnessed the waste of 72.8mg  of Propofol

## 2023-05-18 NOTE — Group Note (Deleted)

## 2023-06-11 ENCOUNTER — Other Ambulatory Visit: Payer: Self-pay | Admitting: Orthopedic Surgery

## 2023-06-11 DIAGNOSIS — S46001A Unspecified injury of muscle(s) and tendon(s) of the rotator cuff of right shoulder, initial encounter: Secondary | ICD-10-CM

## 2023-06-11 DIAGNOSIS — S43014D Anterior dislocation of right humerus, subsequent encounter: Secondary | ICD-10-CM

## 2023-06-12 ENCOUNTER — Encounter: Payer: Self-pay | Admitting: Orthopedic Surgery

## 2023-06-22 ENCOUNTER — Other Ambulatory Visit (HOSPITAL_COMMUNITY): Payer: Self-pay | Admitting: Orthopedic Surgery

## 2023-06-22 DIAGNOSIS — S43014D Anterior dislocation of right humerus, subsequent encounter: Secondary | ICD-10-CM

## 2023-06-22 DIAGNOSIS — S46001A Unspecified injury of muscle(s) and tendon(s) of the rotator cuff of right shoulder, initial encounter: Secondary | ICD-10-CM

## 2023-10-26 NOTE — Progress Notes (Unsigned)
Assessment/Plan:    1.  Essential Tremor  -Status post bilateral VIM DBS in January, 2018.  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  Patient subsequently had a left VIM DBS on December 04, 2017 with IPG placement on Jan 12, 2018.  His right IPG was changed out October 31, 2021.  His left IPG is needing changed.  Discussed with patient that Dr. Jake Samples will be leaving the practice soon and I am not sure he will be available to change the battery.  I can check (I have a call out), but if not he does live in the [place] area and Dr. Marcell Barlow has been gracious enough to help [place].  2.  Low back pain  -Status post L3-L4 laminectomy with Dr. Jake Samples in July, 2024.  Patient is happy with the results and plans to go back to work on Monday.    Subjective:   Carlos Hunt was seen today in follow up for essential tremor.  Records reviewed.  3 days after I saw the patient last visit he tripped and fell and dislocated the right shoulder.  This did not require surgical intervention.  He has noted that his left IPG is needing changed, but has not shut off.  He states that he just got a warning 2 days ago on his device.  He states that they did not renew his contract back at work, so is not currently going back to work.  Current prescribed movement disorder medications: n/a   ALLERGIES:   Allergies  Allergen Reactions   Amlodipine     Other reaction(s): Headache   Procaine Nausea And Vomiting and Other (See Comments)    NOVOCAINE- lidocaine ok per patient   Sulfacetamide Sodium-Sulfur Rash    CURRENT MEDICATIONS:  Outpatient Encounter Medications as of 10/27/2023  Medication Sig   Ascorbic Acid (VITAMIN C) 500 MG CAPS Take 500 mg by mouth in the morning.   aspirin EC 81 MG tablet Take 1 tablet (81 mg total) by mouth daily. Swallow whole.   atorvastatin (LIPITOR) 40 MG tablet Take 40 mg by mouth daily.   Cholecalciferol (VITAMIN D3) 2000 units  TABS Take 2,000 Units by mouth in the morning.   Cyanocobalamin (B-12) 3000 MCG CAPS Take 4,000 mcg by mouth in the morning.   Digestive Enzymes (PAPAYA AND ENZYMES PO) Take 1 tablet by mouth daily as needed (for heartburn/indigestion).   hydrALAZINE (APRESOLINE) 10 MG tablet Take 10 mg by mouth 3 (three) times daily.   hydrochlorothiazide (HYDRODIURIL) 25 MG tablet Take 1 tablet (25 mg total) by mouth daily.   HYDROcodone-acetaminophen (NORCO) 5-325 MG tablet Take 1 tablet by mouth every 4 (four) hours as needed for moderate pain.   HYDROcodone-acetaminophen (NORCO/VICODIN) 5-325 MG tablet Take 1 tablet by mouth every 4 (four) hours as needed for moderate pain.   isosorbide mononitrate (IMDUR) 60 MG 24 hr tablet Take 60 mg by mouth daily.   methocarbamol (ROBAXIN-750) 750 MG tablet Take 1 tablet (750 mg total) by mouth every 8 (eight) hours as needed for muscle spasms.   metroNIDAZOLE (METROGEL) 1 % gel Apply 1 application topically daily as needed (rosacea).   Multiple Vitamin (MULTIVITAMIN WITH MINERALS) TABS tablet Take 1 tablet by mouth in the morning. Centrum for Men   Multiple Vitamins-Minerals (AIRBORNE) CHEW Chew 1 tablet by mouth in the morning.   Omega-3 Fatty Acids (OMEGA-3 EPA FISH OIL PO) Take 2 capsules by mouth  in the morning.   Polyvinyl Alcohol-Povidone PF (REFRESH) 1.4-0.6 % SOLN Place 1-2 drops into both eyes 3 (three) times daily as needed (dry/irritated eyes.).   research study medication Take 1 each by mouth daily. Atorvastatin Calcium (INVESTIGATIONAL ATORVASTATIN/PLACEBO) 40 MG-PRagmatic EValuation of evENTs And Benefits of Lipid-lowering in oldEr adults (All Site sIRB) by [facility]   sodium chloride (OCEAN) 0.65 % nasal spray Place 1 spray into the nose at bedtime.   Tetrahydroz-Glyc-Hyprom-PEG (VISINE MAXIMUM REDNESS RELIEF OP) Place 1 drop into both eyes daily as needed (for eye irritation).   valsartan (DIOVAN) 320 MG tablet Take 320 mg by mouth in the morning.   No  facility-administered encounter medications on file as of 10/27/2023.     Objective:    PHYSICAL EXAMINATION:    VITALS:   Vitals:   10/27/23 1432  BP: 130/72  Pulse: 68  SpO2: 96%  Weight: 189 lb 6.4 oz (85.9 kg)  Height: 5\' 10"  (1.778 m)   GEN:  The patient appears stated age and is in NAD. HEENT:  Normocephalic, atraumatic.  The mucous membranes are moist. The superficial temporal arteries are without ropiness or tenderness. Cv: Regular rate rhythm Lungs:  ctab  Neurological examination:  Orientation: The patient is alert and oriented x3. Cranial nerves: There is good facial symmetry. The speech is fluent and clear. Soft palate rises symmetrically and there is no tongue deviation. Hearing is intact to conversational tone. Sensation: Sensation is intact to light touch throughout Motor: Strength is at least antigravity x4.  Movement examination: Tone: There is normal tone in the UE/LE Abnormal movements: There is no rest tremor.  There is no postural or intention tremor, but when the device is turned off, there is certainly a good amount of tremor (which we did today) Coordination:  There is no decremation with RAM's Gait and Station: The patient is wide-based and a bit unsteady. I have reviewed and interpreted the following labs independently   Chemistry      Component Value Date/Time   NA 139 02/26/2023 1430   NA 142 12/07/2017 1225   K 4.0 02/26/2023 1430   CL 103 02/26/2023 1430   CO2 26 02/26/2023 1430   BUN 20 02/26/2023 1430   BUN 15 12/07/2017 1225   CREATININE 1.19 02/26/2023 1430   CREATININE 1.11 06/03/2016 1652      Component Value Date/Time   CALCIUM 9.8 02/26/2023 1430   ALKPHOS 70 02/26/2023 1430   AST 27 02/26/2023 1430   ALT 33 02/26/2023 1430   BILITOT 0.9 02/26/2023 1430   BILITOT 0.3 12/07/2017 1225     Total time spent on today's visit was 30 minutes, including both face-to-face time and nonface-to-face time.  Time included that spent on  review of records (prior notes available to me/labs/imaging if pertinent), discussing treatment and goals, answering patient's questions and coordinating care.  DBS time is reported separate.    Cc:  Ethelda Chick, MD

## 2023-10-27 ENCOUNTER — Ambulatory Visit: Payer: Medicare HMO | Admitting: Neurology

## 2023-10-27 VITALS — BP 130/72 | HR 68 | Ht 70.0 in | Wt 189.4 lb

## 2023-10-27 DIAGNOSIS — Z9689 Presence of other specified functional implants: Secondary | ICD-10-CM | POA: Diagnosis not present

## 2023-10-27 DIAGNOSIS — G250 Essential tremor: Secondary | ICD-10-CM | POA: Diagnosis not present

## 2023-10-27 NOTE — Procedures (Signed)
DBS Programming was performed.    Manufacturer of DBS device: [employer]tronic  Total time spent programming was 8 minutes.  Device was confirmed to be on.  Soft start was confirmed to be on.  Impedences were checked and were within normal limits.  Battery was checked and was near the end of life on the left.  Final settings were as follows:   Active Contact Amplitude (mA) PW (ms) Frequency (hz) Side Effects Battery  Left Brain        04/10/20 0-1+ 2.2 90 150  2.96  04/10/21 0-1+ 2.2 90 150  2.93  06/27/21 0-1+ 2.2 90 150  2.92  10/15/21 0-1+ 2.2 90 150  2.90  12/30/21 0-1+ 2.2 90 150  2.89  09/09/22 0-1+ 2.2 90 150  2.84  05/14/23 0-1+ 2.2(1.8-2.2) 90 150  2.72  10/27/23 0-1+ 2.2 90 150  2.56          Right Brain        04/10/20 2-1+ 2.7 90 150  2.91  04/10/21 2-1+ 2.7 90 150  2.80  06/27/21 2-1+ 2.7 90 150  2.75  10/15/21 2-1+ 2.5 90 140  2.58  12/30/21 2-1+ 2.7 90 140  3.00  09/09/22 2-1+ 2.7 90 140  2.96  05/14/23 2-1+ 2.7 (2.1-2.9) 90 140  2.96  10/27/23 2-1+ 2.7 90 140  2.94

## 2023-10-27 NOTE — Patient Instructions (Signed)
[facility] Address: [address], [place], [place] [address] Phone: [phone]

## 2023-10-28 NOTE — Addendum Note (Signed)
Addended by: Ashok Norris on: 10/28/2023 08:19 AM   Modules accepted: Orders

## 2023-11-03 ENCOUNTER — Other Ambulatory Visit: Payer: Self-pay | Admitting: Neurological Surgery

## 2023-11-09 ENCOUNTER — Encounter (HOSPITAL_COMMUNITY): Payer: Self-pay

## 2023-11-09 ENCOUNTER — Other Ambulatory Visit: Payer: Self-pay | Admitting: Neurological Surgery

## 2023-11-09 NOTE — Pre-Procedure Instructions (Signed)
 Surgical Instructions   Your procedure is scheduled on Tuesday, March 11th. Report to [facility] Main Entrance "A" at 08:40 A.M., then check in with the Admitting office. Any questions or running late day of surgery: call [phone]  Questions prior to your surgery date: call [phone], Monday-Friday, 8am-4pm. If you experience any cold or flu symptoms such as cough, fever, chills, shortness of breath, etc. between now and your scheduled surgery, please notify us at the above number.     Remember:  Do not eat or drink after midnight the night before your surgery    Take these medicines the morning of surgery with A SIP OF WATER  atorvastatin (LIPITOR)  hydrALAZINE (APRESOLINE)  isosorbide mononitrate (IMDUR)    May take these medicines IF NEEDED: Polyvinyl Alcohol-Povidone PF (REFRESH) eye drops  Tetrahydroz-Glyc-Hyprom-PEG (VISINE MAXIMUM REDNESS RELIEF OP) eye drops   Follow your surgeon's instructions on when to stop Aspirin.  If no instructions were given by your surgeon then you will need to call the office to get those instructions.    One week prior to surgery, STOP taking any Aleve, Naproxen, Ibuprofen, Motrin, Advil, Goody's, BC's, all herbal medications, fish oil, and non-prescription vitamins.                     Do NOT Smoke (Tobacco/Vaping) for 24 hours prior to your procedure.  If you use a CPAP at night, you may bring your mask/headgear for your overnight stay.   You will be asked to remove any contacts, glasses, piercing's, hearing aid's, dentures/partials prior to surgery. Please bring cases for these items if needed.    Patients discharged the day of surgery will not be allowed to drive home, and someone needs to stay with them for 24 hours.  SURGICAL WAITING ROOM VISITATION Patients may have no more than 2 support people in the waiting area - these visitors may rotate.   Pre-op nurse will coordinate an appropriate time for 1 ADULT support person, who  may not rotate, to accompany patient in pre-op.  Children under the age of 67 must have an adult with them who is not the patient and must remain in the main waiting area with an adult.  If the patient needs to stay at the hospital during part of their recovery, the visitor guidelines for inpatient rooms apply.  Please refer to the [facility] website for the visitor guidelines for any additional information.   If you received a COVID test during your pre-op visit  it is requested that you wear a mask when out in public, stay away from anyone that may not be feeling well and notify your surgeon if you develop symptoms. If you have been in contact with anyone that has tested positive in the last 10 days please notify you surgeon.      Pre-operative CHG Bathing Instructions   You can play a key role in reducing the risk of infection after surgery. Your skin needs to be as free of germs as possible. You can reduce the number of germs on your skin by washing with CHG (chlorhexidine gluconate) soap before surgery. CHG is an antiseptic soap that kills germs and continues to kill germs even after washing.   DO NOT use if you have an allergy to chlorhexidine/CHG or antibacterial soaps. If your skin becomes reddened or irritated, stop using the CHG and notify one of our RNs at [phone].  TAKE A SHOWER THE NIGHT BEFORE SURGERY AND THE DAY OF SURGERY    Please keep in mind the following:  DO NOT shave, including legs and underarms, 48 hours prior to surgery.   You may shave your face before/day of surgery.  Place clean sheets on your bed the night before surgery Use a clean washcloth (not used since being washed) for each shower. DO NOT sleep with pet's night before surgery.  CHG Shower Instructions:  Wash your face and private area with normal soap. If you choose to wash your hair, wash first with your normal shampoo.  After you use shampoo/soap, rinse your hair and body  thoroughly to remove shampoo/soap residue.  Turn the water OFF and apply half the bottle of CHG soap to a CLEAN washcloth.  Apply CHG soap ONLY FROM YOUR NECK DOWN TO YOUR TOES (washing for 3-5 minutes)  DO NOT use CHG soap on face, private areas, open wounds, or sores.  Pay special attention to the area where your surgery is being performed.  If you are having back surgery, having someone wash your back for you may be helpful. Wait 2 minutes after CHG soap is applied, then you may rinse off the CHG soap.  Pat dry with a clean towel  Put on clean pajamas    Additional instructions for the day of surgery: DO NOT APPLY any lotions, deodorants, cologne, or perfumes.   Do not wear jewelry or makeup Do not wear nail polish, gel polish, artificial nails, or any other type of covering on natural nails (fingers and toes) Do not bring valuables to the hospital. [facility] is not responsible for valuables/personal belongings. Put on clean/comfortable clothes.  Please brush your teeth.  Ask your nurse before applying any prescription medications to the skin.

## 2023-11-10 ENCOUNTER — Encounter (HOSPITAL_COMMUNITY)
Admission: RE | Admit: 2023-11-10 | Discharge: 2023-11-10 | Disposition: A | Discharge status: Home / Self Care | Payer: Medicare HMO | Source: Ambulatory Visit | Attending: Neurological Surgery | Admitting: Neurological Surgery

## 2023-11-10 ENCOUNTER — Other Ambulatory Visit: Payer: Self-pay

## 2023-11-10 ENCOUNTER — Encounter (HOSPITAL_COMMUNITY): Payer: Self-pay

## 2023-11-10 VITALS — BP 144/61 | HR 71 | Temp 97.7°F | Resp 18 | Ht 68.0 in | Wt 190.3 lb

## 2023-11-10 DIAGNOSIS — R0602 Shortness of breath: Secondary | ICD-10-CM | POA: Insufficient documentation

## 2023-11-10 DIAGNOSIS — Z87891 Personal history of nicotine dependence: Secondary | ICD-10-CM | POA: Diagnosis not present

## 2023-11-10 DIAGNOSIS — Z01812 Encounter for preprocedural laboratory examination: Secondary | ICD-10-CM | POA: Insufficient documentation

## 2023-11-10 DIAGNOSIS — R748 Abnormal levels of other serum enzymes: Secondary | ICD-10-CM | POA: Insufficient documentation

## 2023-11-10 DIAGNOSIS — Z7982 Long term (current) use of aspirin: Secondary | ICD-10-CM | POA: Insufficient documentation

## 2023-11-10 DIAGNOSIS — I2510 Atherosclerotic heart disease of native coronary artery without angina pectoris: Secondary | ICD-10-CM | POA: Insufficient documentation

## 2023-11-10 DIAGNOSIS — Z01818 Encounter for other preprocedural examination: Secondary | ICD-10-CM

## 2023-11-10 HISTORY — DX: Atherosclerotic heart disease of native coronary artery without angina pectoris: I25.10

## 2023-11-10 HISTORY — DX: Bradycardia, unspecified: R00.1

## 2023-11-10 LAB — COMPREHENSIVE METABOLIC PANEL
ALT: 55 U/L — ABNORMAL HIGH (ref 0–44)
AST: 33 U/L (ref 15–41)
Albumin: 4.1 g/dL (ref 3.5–5.0)
Alkaline Phosphatase: 72 U/L (ref 38–126)
Anion gap: 8 (ref 5–15)
BUN: 22 mg/dL (ref 8–23)
CO2: 28 mmol/L (ref 22–32)
Calcium: 9.7 mg/dL (ref 8.9–10.3)
Chloride: 103 mmol/L (ref 98–111)
Creatinine, Ser: 1.32 mg/dL — ABNORMAL HIGH (ref 0.61–1.24)
GFR, Estimated: 56 mL/min — ABNORMAL LOW (ref >60)
Glucose, Bld: 104 mg/dL — ABNORMAL HIGH (ref 70–99)
Potassium: 4.4 mmol/L (ref 3.5–5.1)
Sodium: 139 mmol/L (ref 135–145)
Total Bilirubin: 0.7 mg/dL (ref 0.0–1.2)
Total Protein: 7 g/dL (ref 6.5–8.1)

## 2023-11-10 LAB — CBC
HCT: 44.8 % (ref 39.0–52.0)
Hemoglobin: 15.5 g/dL (ref 13.0–17.0)
MCH: 31.8 pg (ref 26.0–34.0)
MCHC: 34.6 g/dL (ref 30.0–36.0)
MCV: 92 fL (ref 80.0–100.0)
Platelets: 270 10*3/uL (ref 150–400)
RBC: 4.87 MIL/uL (ref 4.22–5.81)
RDW: 13 % (ref 11.5–15.5)
WBC: 9.1 10*3/uL (ref 4.0–10.5)
nRBC: 0 % (ref 0.0–0.2)

## 2023-11-10 NOTE — Progress Notes (Addendum)
 PCP - Dr. Nilda Simmer Cardiologist - Dr. Arvilla Meres (pt states he sees cardiology "every once in a while" but not routinely since his ablation")  PPM/ICD - denies   Chest x-ray - 12/11/21 EKG - 02/26/23 Stress Test - 06/22/15 ECHO - 07/19/19 Cardiac Cath - 12/11/21  Sleep Study - denies   DM- denies  Last dose of GLP1 agonist-  n/a GLP1 instructions:   Blood Thinner Instructions: n/a Aspirin Instructions: pt states he was told to start holding, he took his last dose on 2/25  ERAS Protcol - no, NPO   COVID TEST- n/a   Anesthesia review: yes, cardiac hx. Pt was instructed to bring his deep brain stimulator remote  Patient denies shortness of breath, fever, cough and chest pain at PAT appointment   All instructions explained to the patient, with a verbal understanding of the material. Patient agrees to go over the instructions while at home for a better understanding.  The opportunity to ask questions was provided.

## 2023-11-11 NOTE — Anesthesia Preprocedure Evaluation (Addendum)
 Anesthesia Evaluation  Patient identified by MRN, date of birth, ID band Patient awake    Reviewed: Allergy & Precautions, NPO status , Patient's Chart, lab work & pertinent test results, reviewed documented beta blocker date and time   History of Anesthesia Complications (+) PONV and history of anesthetic complications  Airway Mallampati: III  TM Distance: >3 FB     Dental no notable dental hx.    Pulmonary neg COPD, former smoker   breath sounds clear to auscultation       Cardiovascular hypertension, (-) angina (-) CAD, (-) Past MI, (-) Cardiac Stents and (-) CABG + dysrhythmias Atrial Fibrillation  Rhythm:Regular Rate:Normal     Neuro/Psych neg Seizures Essential tremor    GI/Hepatic ,GERD  Medicated and Controlled,,(+) neg Cirrhosis        Endo/Other    Renal/GU Renal disease     Musculoskeletal  (+) Arthritis ,    Abdominal   Peds  Hematology   Anesthesia Other Findings   Reproductive/Obstetrics                              Anesthesia Physical Anesthesia Plan  ASA: 2  Anesthesia Plan: General   Post-op Pain Management:    Induction: Intravenous  PONV Risk Score and Plan: 2 and Ondansetron and Dexamethasone  Airway Management Planned: LMA  Additional Equipment:   Intra-op Plan:   Post-operative Plan: Extubation in OR  Informed Consent: I have reviewed the patients History and Physical, chart, labs and discussed the procedure including the risks, benefits and alternatives for the proposed anesthesia with the patient or authorized representative who has indicated his/her understanding and acceptance.     Dental advisory given  Plan Discussed with: CRNA  Anesthesia Plan Comments: (PAT note by Antionette Poles, PA-C:  78 year old male follows with neurology for history of essential tremor s/p bilateral VIM DBS. His right IPG was changed out October 31, 2021.  His left  IPG now needs to be changed.  History of atrial flutter s/p successful ablation December 2020 at [facility].  Subsequent Holter monitor showed no significant arrhythmia.  Last seen by cardiology 01/07/22 for followup of recent evaluation of DOE. Per note, "3-day Holter did not show any concerning arrhythmia. He underwent a nuclear stress test which was normal by perfusion imaging with an abnormal ECG response to exercise. He notably had a stress test in 2016 through [facility] health. This showed normal perfusion imaging with an abnormal ECG response to exercise. Based on description, ECG findings do appear more prominent on his most recent stress test. He did also have a hypertensive response to exercise during his most recent study. He was initiated on Imdur during last evaluation. He then represented with chest discomfort. He ultimately underwent a left heart catheterization which revealed mild nonobstructive coronary artery disease. Since then, he has had no further chest discomfort. He continues to have shortness of breath particularly with exertion." He was recommended to continue Imdur, ASA 81 mg and statin.   Other pertinent history includes former smoker (30 pack years, quit 1972), PONV, GERD.  Preop labs reviewed, creatinine mildly elevated 1.32, otherwise unremarkable.  EKG 02/26/2023: NSR.  Rate 69.  Left Heart Catheterization 12/11/21 Findings: Non-obstructive CAD LM normal Circumflex normal  LAD mid distal systolic bridging, distal branch pruning RCA 30% ostial, prox and mid LVEDP  Nuclear Stress Test: 11/14/21  Myocardial perfusion imaging is Normal.  Abnormal ECG response to exercise with  normal perfusion imaging  Overall left ventricular systolic function was Normal without regional wall motion abnormalities (see above).  No prior study available for comparison.  Holter Monitor: 11/19/21 * The observed rhythms are sinus bradycardia to sinus tachycardia. * The Maximum Heart Rate  recorded was 202 bpm, Day 2 / 09:49:59 pm, the Minimum Heart Rate recorded was 38 bpm, Day 2 / 05:08:58 am and the Average Heart Rate was 70 bpm. * There were 1685 PVCs with a burden of 0.98 %. There was 1 occurrence of Ventricular Tachycardia with the longest episode 3 beats, Day 2 / 03:00:37 am and the fastest episode 194 bpm, Day 2 / 03:00:37 am. * There were 2827 PSVCs with a burden of 1.64 %. There were 6 occurrences of Supraventricular Tachycardia with the longest episode 8 beats, Day 2 / 04:37:15 am and the fastest episode 202 bpm, Day 2 / 09:50:03 pm. * There were 0 Patient Triggered events. No sustained arrhythmia  Echocardiogram 07/19/19: MILD LV SYSTOLIC DYSFUNCTION (EF 50%)   WITH MILD LVH NORMAL RIGHT VENTRICULAR SYSTOLIC FUNCTION MILD VALVULAR REGURGITATION (Mild MR) NO VALVULAR STENOSIS NO PERICARDIAL EFFUSION MILDLY ENLARGED LEFT ATRIUM MILDLY DILATED IVC NO PRIOR STUDY    )         Anesthesia Quick Evaluation

## 2023-11-11 NOTE — Progress Notes (Signed)
 Anesthesia Chart Review:  78 year old male follows with neurology for history of essential tremor s/p bilateral VIM DBS. His right IPG was changed out October 31, 2021.  His left IPG now needs to be changed.  History of atrial flutter s/p successful ablation December 2020 at [facility].  Subsequent Holter monitor showed no significant arrhythmia.  Last seen by cardiology 01/07/22 for followup of recent evaluation of DOE. Per note, "3-day Holter did not show any concerning arrhythmia. He underwent a nuclear stress test which was normal by perfusion imaging with an abnormal ECG response to exercise. He notably had a stress test in 2016 through [facility] health. This showed normal perfusion imaging with an abnormal ECG response to exercise. Based on description, ECG findings do appear more prominent on his most recent stress test. He did also have a hypertensive response to exercise during his most recent study. He was initiated on Imdur during last evaluation. He then represented with chest discomfort. He ultimately underwent a left heart catheterization which revealed mild nonobstructive coronary artery disease. Since then, he has had no further chest discomfort. He continues to have shortness of breath particularly with exertion." He was recommended to continue Imdur, ASA 81 mg and statin.   Other pertinent history includes former smoker (30 pack years, quit 1972), PONV, GERD.  Preop labs reviewed, creatinine mildly elevated 1.32, otherwise unremarkable.  EKG 02/26/2023: NSR.  Rate 69.  Left Heart Catheterization 12/11/21 Findings: Non-obstructive CAD LM normal Circumflex normal  LAD mid distal systolic bridging, distal branch pruning RCA 30% ostial, prox and mid LVEDP  Nuclear Stress Test: 11/14/21  Myocardial perfusion imaging is Normal.  Abnormal ECG response to exercise with normal perfusion imaging  Overall left ventricular systolic function was Normal without regional wall motion abnormalities  (see above).  No prior study available for comparison.  Holter Monitor: 11/19/21 * The observed rhythms are sinus bradycardia to sinus tachycardia. * The Maximum Heart Rate recorded was 202 bpm, Day 2 / 09:49:59 pm, the Minimum Heart Rate recorded was 38 bpm, Day 2 / 05:08:58 am and the Average Heart Rate was 70 bpm. * There were 1685 PVCs with a burden of 0.98 %. There was 1 occurrence of Ventricular Tachycardia with the longest episode 3 beats, Day 2 / 03:00:37 am and the fastest episode 194 bpm, Day 2 / 03:00:37 am. * There were 2827 PSVCs with a burden of 1.64 %. There were 6 occurrences of Supraventricular Tachycardia with the longest episode 8 beats, Day 2 / 04:37:15 am and the fastest episode 202 bpm, Day 2 / 09:50:03 pm. * There were 0 Patient Triggered events. No sustained arrhythmia  Echocardiogram 07/19/19: MILD LV SYSTOLIC DYSFUNCTION (EF 50%)   WITH MILD LVH NORMAL RIGHT VENTRICULAR SYSTOLIC FUNCTION MILD VALVULAR REGURGITATION (Mild MR) NO VALVULAR STENOSIS NO PERICARDIAL EFFUSION MILDLY ENLARGED LEFT ATRIUM MILDLY DILATED IVC NO PRIOR STUDY     Zannie Cove [facility] Short Stay Center/Anesthesiology Phone [phone] 11/11/2023 12:27 PM

## 2023-11-17 ENCOUNTER — Other Ambulatory Visit: Payer: Self-pay

## 2023-11-17 ENCOUNTER — Ambulatory Visit (HOSPITAL_COMMUNITY)
Admission: RE | Admit: 2023-11-17 | Discharge: 2023-11-17 | Disposition: A | Discharge status: Home / Self Care | Payer: Medicare HMO | Source: Ambulatory Visit | Attending: Neurological Surgery | Admitting: Neurological Surgery

## 2023-11-17 ENCOUNTER — Encounter (HOSPITAL_COMMUNITY)
Admission: RE | Disposition: A | Discharge status: Home / Self Care | Payer: Self-pay | Source: Ambulatory Visit | Attending: Neurological Surgery

## 2023-11-17 ENCOUNTER — Ambulatory Visit (HOSPITAL_BASED_OUTPATIENT_CLINIC_OR_DEPARTMENT_OTHER)

## 2023-11-17 ENCOUNTER — Ambulatory Visit (HOSPITAL_COMMUNITY): Payer: Self-pay | Admitting: Physician Assistant

## 2023-11-17 ENCOUNTER — Encounter (HOSPITAL_COMMUNITY): Payer: Self-pay | Admitting: Neurological Surgery

## 2023-11-17 DIAGNOSIS — G250 Essential tremor: Secondary | ICD-10-CM | POA: Insufficient documentation

## 2023-11-17 DIAGNOSIS — Z4549 Encounter for adjustment and management of other implanted nervous system device: Secondary | ICD-10-CM | POA: Insufficient documentation

## 2023-11-17 DIAGNOSIS — Z4542 Encounter for adjustment and management of neuropacemaker (brain) (peripheral nerve) (spinal cord): Secondary | ICD-10-CM | POA: Diagnosis not present

## 2023-11-17 DIAGNOSIS — I10 Essential (primary) hypertension: Secondary | ICD-10-CM | POA: Insufficient documentation

## 2023-11-17 DIAGNOSIS — K219 Gastro-esophageal reflux disease without esophagitis: Secondary | ICD-10-CM | POA: Insufficient documentation

## 2023-11-17 DIAGNOSIS — M1990 Unspecified osteoarthritis, unspecified site: Secondary | ICD-10-CM | POA: Diagnosis not present

## 2023-11-17 DIAGNOSIS — Z79899 Other long term (current) drug therapy: Secondary | ICD-10-CM | POA: Insufficient documentation

## 2023-11-17 DIAGNOSIS — I4891 Unspecified atrial fibrillation: Secondary | ICD-10-CM | POA: Insufficient documentation

## 2023-11-17 DIAGNOSIS — Z8249 Family history of ischemic heart disease and other diseases of the circulatory system: Secondary | ICD-10-CM | POA: Insufficient documentation

## 2023-11-17 DIAGNOSIS — Z87891 Personal history of nicotine dependence: Secondary | ICD-10-CM | POA: Insufficient documentation

## 2023-11-17 HISTORY — PX: SUBTHALAMIC STIMULATOR BATTERY REPLACEMENT: SHX5405

## 2023-11-17 SURGERY — SUBTHALAMIC STIMULATOR BATTERY REPLACEMENT
Anesthesia: General | Site: Chest | Laterality: Left

## 2023-11-17 MED ORDER — PROPOFOL 10 MG/ML IV BOLUS
INTRAVENOUS | Status: AC
  Filled 2023-11-17: qty 20

## 2023-11-17 MED ORDER — ONDANSETRON HCL 4 MG/2ML IJ SOLN: 4.0000 mg | Freq: Once | INTRAMUSCULAR | Status: DC | PRN

## 2023-11-17 MED ORDER — ONDANSETRON HCL 4 MG/2ML IJ SOLN
INTRAMUSCULAR | Status: DC | PRN
  Administered 2023-11-17: 4 mg via INTRAVENOUS

## 2023-11-17 MED ORDER — ONDANSETRON HCL 4 MG/2ML IJ SOLN
INTRAMUSCULAR | Status: AC
  Filled 2023-11-17: qty 2

## 2023-11-17 MED ORDER — OXYCODONE HCL 5 MG PO TABS: 5.0000 mg | ORAL_TABLET | Freq: Once | ORAL | Status: DC | PRN

## 2023-11-17 MED ORDER — OXYCODONE HCL 5 MG/5ML PO SOLN: 5.0000 mg | Freq: Once | ORAL | Status: DC | PRN

## 2023-11-17 MED ORDER — CHLORHEXIDINE GLUCONATE CLOTH 2 % EX PADS: 6.0000 | MEDICATED_PAD | Freq: Once | CUTANEOUS | Status: DC

## 2023-11-17 MED ORDER — BACITRACIN ZINC 500 UNIT/GM EX OINT
TOPICAL_OINTMENT | CUTANEOUS | Status: AC
  Filled 2023-11-17: qty 28.35

## 2023-11-17 MED ORDER — LACTATED RINGERS IV SOLN: INTRAVENOUS | Status: DC

## 2023-11-17 MED ORDER — SUGAMMADEX SODIUM 200 MG/2ML IV SOLN
INTRAVENOUS | Status: AC
  Filled 2023-11-17: qty 2

## 2023-11-17 MED ORDER — LIDOCAINE 2% (20 MG/ML) 5 ML SYRINGE
INTRAMUSCULAR | Status: DC | PRN
  Administered 2023-11-17: 100 mg via INTRAVENOUS

## 2023-11-17 MED ORDER — ORAL CARE MOUTH RINSE: 15.0000 mL | Freq: Once | OROMUCOSAL | Status: AC

## 2023-11-17 MED ORDER — ACETAMINOPHEN 10 MG/ML IV SOLN: 1000.0000 mg | Freq: Once | INTRAVENOUS | Status: DC | PRN

## 2023-11-17 MED ORDER — FENTANYL CITRATE (PF) 250 MCG/5ML IJ SOLN
INTRAMUSCULAR | Status: AC
  Filled 2023-11-17: qty 5

## 2023-11-17 MED ORDER — FENTANYL CITRATE (PF) 250 MCG/5ML IJ SOLN
INTRAMUSCULAR | Status: DC | PRN
  Administered 2023-11-17 (×2): 50 ug via INTRAVENOUS

## 2023-11-17 MED ORDER — ROCURONIUM BROMIDE 10 MG/ML (PF) SYRINGE
PREFILLED_SYRINGE | INTRAVENOUS | Status: AC
  Filled 2023-11-17: qty 10

## 2023-11-17 MED ORDER — CEFAZOLIN SODIUM-DEXTROSE 2-4 GM/100ML-% IV SOLN
2.0000 g | INTRAVENOUS | Status: AC
  Administered 2023-11-17: 2 g via INTRAVENOUS
  Filled 2023-11-17: qty 100

## 2023-11-17 MED ORDER — VANCOMYCIN HCL 1000 MG IV SOLR
INTRAVENOUS | Status: AC
  Filled 2023-11-17: qty 20

## 2023-11-17 MED ORDER — LIDOCAINE-EPINEPHRINE 1 %-1:100000 IJ SOLN
INTRAMUSCULAR | Status: AC
  Filled 2023-11-17: qty 1

## 2023-11-17 MED ORDER — DEXAMETHASONE SODIUM PHOSPHATE 10 MG/ML IJ SOLN
INTRAMUSCULAR | Status: DC | PRN
  Administered 2023-11-17: 5 mg via INTRAVENOUS

## 2023-11-17 MED ORDER — CHLORHEXIDINE GLUCONATE 0.12 % MT SOLN
15.0000 mL | Freq: Once | OROMUCOSAL | Status: AC
  Administered 2023-11-17: 15 mL via OROMUCOSAL
  Filled 2023-11-17: qty 15

## 2023-11-17 MED ORDER — FENTANYL CITRATE (PF) 100 MCG/2ML IJ SOLN: 25.0000 ug | INTRAMUSCULAR | Status: DC | PRN

## 2023-11-17 MED ORDER — EPHEDRINE SULFATE-NACL 50-0.9 MG/10ML-% IV SOSY
PREFILLED_SYRINGE | INTRAVENOUS | Status: DC | PRN
  Administered 2023-11-17 (×2): 5 mg via INTRAVENOUS

## 2023-11-17 MED ORDER — PROPOFOL 10 MG/ML IV BOLUS
INTRAVENOUS | Status: DC | PRN
  Administered 2023-11-17: 125 ug/kg/min via INTRAVENOUS
  Administered 2023-11-17: 180 mg via INTRAVENOUS

## 2023-11-17 MED ORDER — DEXAMETHASONE SODIUM PHOSPHATE 10 MG/ML IJ SOLN
INTRAMUSCULAR | Status: AC
  Filled 2023-11-17: qty 1

## 2023-11-17 MED ORDER — 0.9 % SODIUM CHLORIDE (POUR BTL) OPTIME
TOPICAL | Status: DC | PRN
  Administered 2023-11-17: 1000 mL

## 2023-11-17 MED ORDER — LIDOCAINE 2% (20 MG/ML) 5 ML SYRINGE
INTRAMUSCULAR | Status: AC
  Filled 2023-11-17: qty 5

## 2023-11-17 MED ORDER — PHENYLEPHRINE 80 MCG/ML (10ML) SYRINGE FOR IV PUSH (FOR BLOOD PRESSURE SUPPORT)
PREFILLED_SYRINGE | INTRAVENOUS | Status: AC
  Filled 2023-11-17: qty 10

## 2023-11-17 MED ORDER — LIDOCAINE-EPINEPHRINE 1 %-1:100000 IJ SOLN
INTRAMUSCULAR | Status: DC | PRN
  Administered 2023-11-17: 10 mL

## 2023-11-17 SURGICAL SUPPLY — 39 items
BAG COUNTER SPONGE SURGICOUNT (BAG) ×1 IMPLANT
CANISTER SUCT 3000ML PPV (MISCELLANEOUS) ×1 IMPLANT
DERMABOND ADVANCED .7 DNX12 (GAUZE/BANDAGES/DRESSINGS) ×1 IMPLANT
DRAPE CAMERA VIDEO/LASER (DRAPES) ×1 IMPLANT
DRAPE LAPAROTOMY 100X72 PEDS (DRAPES) ×1 IMPLANT
DRSG AQUACEL ADVANTAGE 4X5 (GAUZE/BANDAGES/DRESSINGS) ×1 IMPLANT
DRSG OPSITE POSTOP 4X6 (GAUZE/BANDAGES/DRESSINGS) ×1 IMPLANT
DURAPREP 26ML APPLICATOR (WOUND CARE) ×1 IMPLANT
ELECT COATED BLADE 2.86 ST (ELECTRODE) ×1 IMPLANT
GAUZE 4X4 16PLY ~~LOC~~+RFID DBL (SPONGE) IMPLANT
GLOVE BIO SURGEON STRL SZ7 (GLOVE) ×1 IMPLANT
GLOVE BIOGEL PI IND STRL 7.5 (GLOVE) ×1 IMPLANT
GLOVE BIOGEL PI IND STRL 8 (GLOVE) ×1 IMPLANT
GLOVE ECLIPSE 8.0 STRL XLNG CF (GLOVE) ×1 IMPLANT
GLOVE EXAM NITRILE XL STR (GLOVE) IMPLANT
GOWN STRL REUS W/ TWL LRG LVL3 (GOWN DISPOSABLE) IMPLANT
GOWN STRL REUS W/ TWL XL LVL3 (GOWN DISPOSABLE) ×2 IMPLANT
GOWN STRL REUS W/TWL 2XL LVL3 (GOWN DISPOSABLE) ×1 IMPLANT
KIT BASIN OR (CUSTOM PROCEDURE TRAY) ×1 IMPLANT
KIT REMOVER STAPLE SKIN (MISCELLANEOUS) ×1 IMPLANT
KIT TURNOVER KIT B (KITS) ×1 IMPLANT
MARKER SKIN DUAL TIP RULER LAB (MISCELLANEOUS) ×1 IMPLANT
NDL HYPO 25X1 1.5 SAFETY (NEEDLE) ×1 IMPLANT
NEEDLE HYPO 25X1 1.5 SAFETY (NEEDLE) ×1 IMPLANT
NEUROSTIM OCTOPOLAR ~~LOC~~ 60X55 (Neuro Prosthesis/Implant) IMPLANT
NS IRRIG 1000ML POUR BTL (IV SOLUTION) ×1 IMPLANT
PACK LAMINECTOMY NEURO (CUSTOM PROCEDURE TRAY) ×1 IMPLANT
PAD ARMBOARD 7.5X6 YLW CONV (MISCELLANEOUS) ×3 IMPLANT
PASSER CATH 36 CODMAN DISP (NEUROSURGERY SUPPLIES) IMPLANT
PASSER CATH SHUNT 55CM (INSTRUMENTS) IMPLANT
POWDER SURGICEL 3.0 GRAM (HEMOSTASIS) ×1 IMPLANT
SPIKE FLUID TRANSFER (MISCELLANEOUS) ×1 IMPLANT
SUT MNCRL AB 3-0 PS2 18 (SUTURE) IMPLANT
SUT PROLENE 3 0 PS 1 (SUTURE) IMPLANT
SUT VIC AB 2-0 CP2 18 (SUTURE) ×1 IMPLANT
SUT VIC AB 3-0 SH 8-18 (SUTURE) ×1 IMPLANT
TOWEL GREEN STERILE (TOWEL DISPOSABLE) ×1 IMPLANT
TOWEL GREEN STERILE FF (TOWEL DISPOSABLE) ×1 IMPLANT
WATER STERILE IRR 1000ML POUR (IV SOLUTION) ×1 IMPLANT

## 2023-11-17 NOTE — Anesthesia Postprocedure Evaluation (Signed)
 Anesthesia Post Note  Patient: Hriday A Mandile  Procedure(s) Performed: LEFT IPG REPLACEMENT (Left: Chest)     Patient location during evaluation: PACU Anesthesia Type: General Level of consciousness: awake and alert Pain management: pain level controlled Vital Signs Assessment: post-procedure vital signs reviewed and stable Respiratory status: spontaneous breathing, nonlabored ventilation, respiratory function stable and patient connected to nasal cannula oxygen Cardiovascular status: blood pressure returned to baseline and stable Postop Assessment: no apparent nausea or vomiting Anesthetic complications: no   No notable events documented.  Last Vitals:  Vitals:   11/17/23 1200 11/17/23 1215  BP: (!) 136/56 (!) 145/71  Pulse: 61 62  Resp: 18 15  Temp:  (!) 36.4 C  SpO2: 97% 95%    Last Pain:  Vitals:   11/17/23 1215  PainSc: 0-No pain                 Mariann Barter

## 2023-11-17 NOTE — H&P (Signed)
 Providing Compassionate, Quality Care - Together  NEUROSURGERY HISTORY & PHYSICAL   Carlos Hunt is an 78 y.o. male.   Chief Complaint: Left chest IPG end of battery life HPI: This is a 78 year old male with bilateral DBS for essential tremor, presented with end of battery life in his left chest IPG.  He presents today for surgical placement.  Past Medical History:  Diagnosis Date   Adenomatous polyps 04/02/2017   Arthritis    Brain abscess 03/11/2017   Coronary artery disease    Dysrhythmia    A-flutter   Elevated liver enzymes    Erectile dysfunction    Essential hypertension, benign 02/14/2013   Essential tremor    GERD (gastroesophageal reflux disease)    Head injury    Hx of colonic polyps    Hyperlipidemia    Hypertension    New onset atrial flutter (HCC)    s/p ablation 08/2019   Pain in left knee    Pain, joint, shoulder    PONV (postoperative nausea and vomiting)    nausea no vomiting   Seasonal allergies    Sinus bradycardia    Testicular hypofunction    Tobacco use disorder    Tremor    Unspecified vitamin D deficiency     Past Surgical History:  Procedure Laterality Date   A FLUTTER ABLATION  08/2019   COLONOSCOPY  09/09/2011   normal.  Previous polyps.  [name]. Repeat 5 years.   COLONOSCOPY N/A 07/18/2016   Procedure: COLONOSCOPY;  Surgeon: Scot Jun, MD;  Location: [facility] ENDOSCOPY;  Service: Endoscopy;  Laterality: N/A;   COLONOSCOPY N/A 10/10/2021   Procedure: COLONOSCOPY;  Surgeon: Jaynie Collins, DO;  Location: [facility] ENDOSCOPY;  Service: Gastroenterology;  Laterality: N/A;   DEEP BRAIN STIMULATOR PLACEMENT     HAND SURGERY Left 2011   trauma   LAPAROSCOPIC INGUINAL HERNIA WITH UMBILICAL HERNIA Right 07/15/2017   Procedure: LAPAROSCOPIC INGUINAL HERNIA REPAIR, right with umbilical herniorrhaphy;  Surgeon: Lattie Haw, MD;  Location: [facility] ORS;  Service: General;  Laterality: Right;   LUMBAR LAMINECTOMY/ DECOMPRESSION WITH  MET-RX Left 03/10/2023   Procedure: Minimally Invasive Surgery MICRODISCECTOMY, Lumbar three-four;  Surgeon: Dula Havlik, Alan Mulder, DO;  Location: [facility] OR;  Service: Neurosurgery;  Laterality: Left;   MINOR PLACEMENT OF FIDUCIAL N/A 11/24/2017   Procedure: MINOR PLACEMENT OF FIDUCIAL;  Surgeon: Maeola Harman, MD;  Location: [facility] OR;  Service: Neurosurgery;  Laterality: N/A;  Fiducial placement   PULSE GENERATOR IMPLANT Bilateral 10/23/2016   Procedure: BILATERAL IMPLANTABLE PULSE GENERATOR;  Surgeon: Maeola Harman, MD;  Location: [facility] OR;  Service: Neurosurgery;  Laterality: Bilateral;  BILATERAL IMPLANTABLE PULSE GENERATOR   PULSE GENERATOR IMPLANT Left 01/12/2018   Procedure: UNILATERAL PULSE GENERATOR IMPLANT AND EXTENTIONS FOR DEEP BRAIN STIMULATOR;  Surgeon: Maeola Harman, MD;  Location: [facility] OR;  Service: Neurosurgery;  Laterality: Left;   PULSE GENERATOR IMPLANT Right 10/31/2021   Procedure: Change Implantable Pulse Generator battery;  Surgeon: Collis Thede, Alan Mulder, DO;  Location: [facility] OR;  Service: Neurosurgery;  Laterality: Right;   SUBTHALAMIC STIMULATOR INSERTION Bilateral 10/07/2016   Procedure: BILATERAL DEEP BRAIN STIMULATOR PLACEMENT;  Surgeon: Maeola Harman, MD;  Location: [facility] OR;  Service: Neurosurgery;  Laterality: Bilateral;  BILATERAL DEEP BRAIN STIMULATOR PLACEMENT   SUBTHALAMIC STIMULATOR INSERTION Left 12/04/2017   Procedure: Left Deep Brain Stimulator Placement;  Surgeon: Maeola Harman, MD;  Location: [facility] OR;  Service: Neurosurgery;  Laterality: Left;  Left deep brain stimulator placement   SUBTHALAMIC STIMULATOR REMOVAL  Left 03/10/2017   Procedure: REMOVAL DEEP BRAIN STIMULATOR AND ASPIRATION OF BRAIN ABSCESS;  Surgeon: Julio Sicks, MD;  Location: [facility] OR;  Service: Neurosurgery;  Laterality: Left;   UMBILICAL HERNIA REPAIR N/A 07/15/2017   Procedure: HERNIA REPAIR UMBILICAL ADULT;  Surgeon: Lattie Haw, MD;  Location: [facility] ORS;  Service: General;  Laterality: N/A;    Family History  Problem  Relation Age of Onset   Dementia Mother    Alzheimer's disease Mother    Parkinson's disease Father    Cancer Father 33       prostate   Heart disease Father 41       CAD/CABG   Hypertension Father    Hyperlipidemia Father    Parkinsonism Father    Coronary artery disease Father    Heart attack Father    Colon cancer Father    Alzheimer's disease Sister    Diabetes Sister    Parkinson's disease Brother    Coronary artery disease Brother    Social History:  reports that he quit smoking about 53 years ago. His smoking use included cigarettes. He started smoking about 63 years ago. He has a 30 pack-year smoking history. He has never used smokeless tobacco. He reports that he does not currently use alcohol. He reports that he does not use drugs.  Allergies:  Allergies  Allergen Reactions   Amlodipine Other (See Comments)    Headache   Procaine Nausea And Vomiting and Other (See Comments)    NOVOCAINE- lidocaine ok per patient   Sulfacetamide Sodium-Sulfur Rash    Medications Prior to Admission  Medication Sig Dispense Refill   Ascorbic Acid (VITAMIN C PO) Take 1,000 mg by mouth in the morning.     atorvastatin (LIPITOR) 40 MG tablet Take 40 mg by mouth in the morning.     Cholecalciferol (VITAMIN D3) 2000 units TABS Take 2,000 Units by mouth in the morning.     Cyanocobalamin (B-12 PO) Take 3,000 mcg by mouth in the morning.     Digestive Enzymes (PAPAYA AND ENZYMES PO) Take 1 tablet by mouth daily as needed (for heartburn/indigestion).     hydrALAZINE (APRESOLINE) 10 MG tablet Take 10 mg by mouth 3 (three) times daily.     hydrochlorothiazide (HYDRODIURIL) 25 MG tablet Take 1 tablet (25 mg total) by mouth daily. 90 tablet 3   isosorbide mononitrate (IMDUR) 60 MG 24 hr tablet Take 60 mg by mouth in the morning.     Multiple Vitamin (MULTIVITAMIN WITH MINERALS) TABS tablet Take 1 tablet by mouth in the morning. Centrum for Men     sodium chloride (OCEAN) 0.65 % nasal spray Place  1 spray into the nose at bedtime.     valsartan (DIOVAN) 320 MG tablet Take 320 mg by mouth in the morning.     [Paused] aspirin EC 81 MG tablet Take 1 tablet (81 mg total) by mouth daily. Swallow whole. 30 tablet 11   Polyvinyl Alcohol-Povidone PF (REFRESH) 1.4-0.6 % SOLN Place 1-2 drops into both eyes 3 (three) times daily as needed (dry/irritated eyes.).     Tetrahydroz-Glyc-Hyprom-PEG (VISINE MAXIMUM REDNESS RELIEF OP) Place 1 drop into both eyes daily as needed (for eye irritation).      No results found for this or any previous visit (from the past 48 hours). No results found.  ROS All pertinent positives and negatives are listed HPI above  Blood pressure (!) 144/67, pulse 61, temperature 98.2 F (36.8 C), resp. rate 18, height 5\' 8"  (  1.727 m), weight 83.9 kg, SpO2 96%. Physical Exam  Awake alert oriented x 3, no acute distress PERRLA Speech slow, appropriate Moves all extremities equally Face symmetric Nonlabored breathing  Assessment/Plan 78 year old male with  Left chest IPG end of battery life  -Or today for left chest IPG battery replacement.  We discussed all risks, benefits and expected outcomes as well as alternatives to treatment.  Informed consent was obtained and witnessed.  Thank you for allowing me to participate in this patient's care.  Please do not hesitate to call with questions or concerns.   Monia Pouch, DO Neurosurgeon [facility] [phone]

## 2023-11-17 NOTE — Transfer of Care (Signed)
 Immediate Anesthesia Transfer of Care Note  Patient: Carlos Hunt  Procedure(s) Performed: LEFT IPG REPLACEMENT (Left: Chest)  Patient Location: PACU  Anesthesia Type:General  Level of Consciousness: awake, alert , and oriented  Airway & Oxygen Therapy: Patient Spontanous Breathing  Post-op Assessment: Report given to RN and Post -op Vital signs reviewed and stable  Post vital signs: Reviewed and stable  Last Vitals:  Vitals Value Taken Time  BP 135/54 11/17/23 1145  Temp 36.4 C 11/17/23 1145  Pulse 65 11/17/23 1158  Resp 1 11/17/23 1156  SpO2 98 % 11/17/23 1158  Vitals shown include unfiled device data.  Last Pain:  Vitals:   11/17/23 1145  PainSc: 0-No pain         Complications: No notable events documented.

## 2023-11-17 NOTE — Op Note (Signed)
   Providing Compassionate, Quality Care - Together  Date of service: 11/17/2023  PREOP DIAGNOSIS:  Left chest end of battery life IPG for deep brain stimulator  POSTOP DIAGNOSIS: Same  PROCEDURE: Left chest IPG battery replacement, [employer] activa [place] (serial number [id number])  SURGEON: Dr. Kendell Bane C. Axxel Gude, DO  ASSISTANT: None  ANESTHESIA: LMA  EBL: Minimal  SPECIMENS: None  DRAINS: None  COMPLICATIONS: None  CONDITION: Dynamically stable  HISTORY: Carlos Hunt is a 78 y.o. male with a history of bilateral DBS for essential tremor, with his left chest IPG noted to be at end of battery life.  Therefore he presented for battery replacement.  We discussed all risks, benefits and expected outcomes as well as alternatives to treatment.  Informed consent was obtained and witnessed.  PROCEDURE IN DETAIL: The patient was brought to the operating room. After induction of general anesthesia, the patient was positioned on the operative table in the supine position. All pressure points were meticulously padded. Skin incision was then marked out and prepped and draped in the usual sterile fashion. Physician driven timeout was performed.  Local anesthetic was injected into the left chest prior incision.  Using 15 blade, incision was made sharply.  Using Bovie electrocautery, soft tissue dissection was performed to the IPG.  This was circumferentially cleared of scar tissue, the lead was identified and carefully protected.  This was extricated from the pocket without difficulty.  The lead was disconnected and the old battery was removed, reconnected to the new battery.  Impedances were checked and there is noted to be appropriate contact with minimal impedance at all for contacts.  This was final tightened to the manufacturer's recommendation.  This was then placed in the pocket in its original position.  The pocket was noted to be excellently hemostatic.  We then closed the wounds in layers,  the pocket layer was closed with 2-0 Vicryl suture.  Dermis was closed with 2-0 and 3-0 Vicryl suture and 3-0 Monocryl.  Skin was closed with skin glue.  Sterile dressing was applied.  At the end of the case all sponge, needle, and instrument counts were correct. The patient was then transferred to the stretcher, extubated, and taken to the post-anesthesia care unit in stable hemodynamic condition.

## 2023-11-17 NOTE — Anesthesia Procedure Notes (Signed)
 Procedure Name: LMA Insertion Date/Time: 11/17/2023 11:05 AM  Performed by: Camillia Herter, CRNAPre-anesthesia Checklist: Patient identified, Emergency Drugs available, Suction available and Patient being monitored Patient Re-evaluated:Patient Re-evaluated prior to induction Oxygen Delivery Method: Circle System Utilized Preoxygenation: Pre-oxygenation with 100% oxygen Induction Type: IV induction Ventilation: Mask ventilation without difficulty LMA: LMA inserted LMA Size: 5.0 Number of attempts: 1 Airway Equipment and Method: Bite block Placement Confirmation: positive ETCO2 Tube secured with: Tape Dental Injury: Teeth and Oropharynx as per pre-operative assessment

## 2023-11-19 ENCOUNTER — Encounter (HOSPITAL_COMMUNITY): Payer: Self-pay | Admitting: Neurological Surgery

## 2023-11-27 ENCOUNTER — Ambulatory Visit
Admission: RE | Admit: 2023-11-27 | Discharge: 2023-11-27 | Disposition: A | Discharge status: Home / Self Care | Source: Ambulatory Visit | Attending: Urology | Admitting: Urology

## 2023-11-27 DIAGNOSIS — N2889 Other specified disorders of kidney and ureter: Secondary | ICD-10-CM | POA: Diagnosis present

## 2024-01-27 ENCOUNTER — Encounter: Payer: Self-pay | Admitting: Urology

## 2024-01-27 ENCOUNTER — Ambulatory Visit: Payer: Self-pay | Admitting: Urology

## 2024-01-27 VITALS — BP 148/77 | HR 71 | Ht 68.0 in | Wt 184.0 lb

## 2024-01-27 DIAGNOSIS — N281 Cyst of kidney, acquired: Secondary | ICD-10-CM | POA: Diagnosis not present

## 2024-01-27 DIAGNOSIS — D1771 Benign lipomatous neoplasm of kidney: Secondary | ICD-10-CM

## 2024-01-27 NOTE — Progress Notes (Signed)
 I, Carlos Hunt, acting as a scribe for Geraline Knapp, MD., have documented all relevant documentation on the behalf of Geraline Knapp, MD, as directed by Geraline Knapp, MD while in the presence of Geraline Knapp, MD.  01/27/2024 2:19 PM   Carlos Hunt 01/28/1946 [id number]  Referring provider: Valere Gata, MD [address] [place],  [place] [address]  Chief Complaint  Patient presents with   renal angiomyolipoma    Urologic history: 1.  Left angiomyolipoma -8 mm 2017 -12 mm 12/2017 -Asymptomatic   2.  Renal cysts -Bilateral simple renal cysts  HPI: Carlos Hunt is a 78 y.o. male presents for follow-up.  Last visit was in 2023 and he has had no problem since that time.  Denies flank, abdominal, or pelvic pain Denies gross hematuria.  Renal ultrasound performed 12/03/2023 showed bilateral renal cysts and a stable 1.2 x 0.8 x 0.8 cm. Left renal mass unchanged from previous imaging and consistent with the known angiomyelopoma.   PMH: Past Medical History:  Diagnosis Date   Adenomatous polyps 04/02/2017   Arthritis    Brain abscess 03/11/2017   Coronary artery disease    Dysrhythmia    A-flutter   Elevated liver enzymes    Erectile dysfunction    Essential hypertension, benign 02/14/2013   Essential tremor    GERD (gastroesophageal reflux disease)    Head injury    Hx of colonic polyps    Hyperlipidemia    Hypertension    New onset atrial flutter (HCC)    s/p ablation 08/2019   Pain in left knee    Pain, joint, shoulder    PONV (postoperative nausea and vomiting)    nausea no vomiting   Seasonal allergies    Sinus bradycardia    Testicular hypofunction    Tobacco use disorder    Tremor    Unspecified vitamin D  deficiency     Surgical History: Past Surgical History:  Procedure Laterality Date   A FLUTTER ABLATION  08/2019   COLONOSCOPY  09/09/2011   normal.  Previous polyps.  [name]. Repeat 5 years.   COLONOSCOPY N/A 07/18/2016    Procedure: COLONOSCOPY;  Surgeon: Cassie Click, MD;  Location: [facility] ENDOSCOPY;  Service: Endoscopy;  Laterality: N/A;   COLONOSCOPY N/A 10/10/2021   Procedure: COLONOSCOPY;  Surgeon: Quintin Buckle, DO;  Location: [facility] ENDOSCOPY;  Service: Gastroenterology;  Laterality: N/A;   DEEP BRAIN STIMULATOR PLACEMENT     HAND SURGERY Left 2011   trauma   LAPAROSCOPIC INGUINAL HERNIA WITH UMBILICAL HERNIA Right 07/15/2017   Procedure: LAPAROSCOPIC INGUINAL HERNIA REPAIR, right with umbilical herniorrhaphy;  Surgeon: Claudia Cuff, MD;  Location: [facility] ORS;  Service: General;  Laterality: Right;   LUMBAR LAMINECTOMY/ DECOMPRESSION WITH MET-RX Left 03/10/2023   Procedure: Minimally Invasive Surgery MICRODISCECTOMY, Lumbar three-four;  Surgeon: Dawley, Colby Daub, DO;  Location: [facility] OR;  Service: Neurosurgery;  Laterality: Left;   MINOR PLACEMENT OF FIDUCIAL N/A 11/24/2017   Procedure: MINOR PLACEMENT OF FIDUCIAL;  Surgeon: Manya Sells, MD;  Location: [facility] OR;  Service: Neurosurgery;  Laterality: N/A;  Fiducial placement   PULSE GENERATOR IMPLANT Bilateral 10/23/2016   Procedure: BILATERAL IMPLANTABLE PULSE GENERATOR;  Surgeon: Manya Sells, MD;  Location: [facility] OR;  Service: Neurosurgery;  Laterality: Bilateral;  BILATERAL IMPLANTABLE PULSE GENERATOR   PULSE GENERATOR IMPLANT Left 01/12/2018   Procedure: UNILATERAL PULSE GENERATOR IMPLANT AND EXTENTIONS FOR DEEP BRAIN STIMULATOR;  Surgeon: Manya Sells, MD;  Location:  [facility] OR;  Service: Neurosurgery;  Laterality: Left;   PULSE GENERATOR IMPLANT Right 10/31/2021   Procedure: Change Implantable Pulse Generator battery;  Surgeon: Dawley, Colby Daub, DO;  Location: [facility] OR;  Service: Neurosurgery;  Laterality: Right;   SUBTHALAMIC STIMULATOR BATTERY REPLACEMENT Left 11/17/2023   Procedure: LEFT IPG REPLACEMENT;  Surgeon: Pincus Bridgeman, DO;  Location: [facility] OR;  Service: Neurosurgery;  Laterality: Left;  3C   SUBTHALAMIC STIMULATOR INSERTION Bilateral 10/07/2016    Procedure: BILATERAL DEEP BRAIN STIMULATOR PLACEMENT;  Surgeon: Manya Sells, MD;  Location: [facility] OR;  Service: Neurosurgery;  Laterality: Bilateral;  BILATERAL DEEP BRAIN STIMULATOR PLACEMENT   SUBTHALAMIC STIMULATOR INSERTION Left 12/04/2017   Procedure: Left Deep Brain Stimulator Placement;  Surgeon: Manya Sells, MD;  Location: [facility] OR;  Service: Neurosurgery;  Laterality: Left;  Left deep brain stimulator placement   SUBTHALAMIC STIMULATOR REMOVAL Left 03/10/2017   Procedure: REMOVAL DEEP BRAIN STIMULATOR AND ASPIRATION OF BRAIN ABSCESS;  Surgeon: Agustina Aldrich, MD;  Location: [facility] OR;  Service: Neurosurgery;  Laterality: Left;   UMBILICAL HERNIA REPAIR N/A 07/15/2017   Procedure: HERNIA REPAIR UMBILICAL ADULT;  Surgeon: Claudia Cuff, MD;  Location: [facility] ORS;  Service: General;  Laterality: N/A;    Home Medications:  Allergies as of 01/27/2024       Reactions   Amlodipine Other (See Comments)   Headache   Procaine Nausea And Vomiting, Other (See Comments)   NOVOCAINE- lidocaine  ok per patient   Sulfacetamide Sodium-sulfur Rash        Medication List        Accurate as of Jan 27, 2024  2:19 PM. If you have any questions, ask your nurse or doctor.          aspirin  EC 81 MG tablet Take 1 tablet (81 mg total) by mouth daily. Swallow whole.   atorvastatin 40 MG tablet Commonly known as: LIPITOR Take 40 mg by mouth in the morning.   B-12 PO Take 3,000 mcg by mouth in the morning.   hydrALAZINE 10 MG tablet Commonly known as: APRESOLINE Take 10 mg by mouth 3 (three) times daily.   hydrochlorothiazide  25 MG tablet Commonly known as: HYDRODIURIL  Take 1 tablet (25 mg total) by mouth daily.   isosorbide mononitrate 60 MG 24 hr tablet Commonly known as: IMDUR Take 60 mg by mouth in the morning.   multivitamin with minerals Tabs tablet Take 1 tablet by mouth in the morning. Centrum for Men   PAPAYA AND ENZYMES PO Take 1 tablet by mouth daily as needed (for  heartburn/indigestion).   Refresh 1.4-0.6 % Soln Generic drug: Polyvinyl Alcohol -Povidone PF Place 1-2 drops into both eyes 3 (three) times daily as needed (dry/irritated eyes.).   sodium chloride  0.65 % nasal spray Commonly known as: OCEAN Place 1 spray into the nose at bedtime.   valsartan 320 MG tablet Commonly known as: DIOVAN Take 320 mg by mouth in the morning.   VISINE MAXIMUM REDNESS RELIEF OP Place 1 drop into both eyes daily as needed (for eye irritation).   VITAMIN C  PO Take 1,000 mg by mouth in the morning.   Vitamin D3 50 MCG (2000 UT) Tabs Take 2,000 Units by mouth in the morning.        Allergies:  Allergies  Allergen Reactions   Amlodipine Other (See Comments)    Headache   Procaine Nausea And Vomiting and Other (See Comments)    NOVOCAINE- lidocaine  ok per patient   Sulfacetamide Sodium-Sulfur Rash  Family History: Family History  Problem Relation Age of Onset   Dementia Mother    Alzheimer's disease Mother    Parkinson's disease Father    Cancer Father 61       prostate   Heart disease Father 55       CAD/CABG   Hypertension Father    Hyperlipidemia Father    Parkinsonism Father    Coronary artery disease Father    Heart attack Father    Colon cancer Father    Alzheimer's disease Sister    Diabetes Sister    Parkinson's disease Brother    Coronary artery disease Brother     Social History:  reports that he quit smoking about 53 years ago. His smoking use included cigarettes. He started smoking about 63 years ago. He has a 30 pack-year smoking history. He has never used smokeless tobacco. He reports that he does not currently use alcohol . He reports that he does not use drugs.   Physical Exam: BP (!) 148/77   Pulse 71   Ht 5\' 8"  (1.727 m)   Wt 184 lb (83.5 kg)   BMI 27.98 kg/m   Constitutional:  Alert and oriented, No acute distress. HEENT: [place] AT, moist mucus membranes.  Trachea midline, no masses. Cardiovascular: No clubbing,  cyanosis, or edema. Respiratory: Normal respiratory effort, no increased work of breathing. GI: Abdomen is soft, nontender, nondistended, no abdominal masses Skin: No rashes, bruises or suspicious lesions. Neurologic: Grossly intact, no focal deficits, moving all 4 extremities. Psychiatric: Normal mood and affect.   Pertinent Imaging: Ultrasound was personally reviewed and interpreted.   Ultrasound renal complete  Narrative CLINICAL DATA:  Renal mass  EXAM: RENAL / URINARY TRACT ULTRASOUND COMPLETE  COMPARISON:  Renal ultrasound Jan 15, 2022  FINDINGS: Right Kidney:  Renal measurements: 10.1 x 5.4 x 5.0 cm = volume: 144.7. Cyst in the lower pole 4.5 x 4.5 x 3.5 cm cyst in the upper pole 1.5 x 1.4 x 1.7 cm. Several other smaller cysts were identified. ML. Echogenicity within normal limits. No mass or hydronephrosis visualized.  Left Kidney:  Renal measurements: 11.1 x 5.1 x 4.1 cm = volume: 121.9 mL. Cyst in the upper pole of 1.2 by 0.8 x 0.8 cm. Solid hyperechoic nodule along the medial cortical region of the left mid kidney correlates with the prior ultrasound and could correlate with an angiomyolipoma there appears stable since prior examination. No new findings  Bladder:  Appears normal for degree of bladder distention.  Other:  None.  IMPRESSION: *Stable bilateral renal cysts. *Stable solid hyperechoic nodule along the medial cortical region of the left mid kidney correlates with the prior ultrasound and could correlate with an angiomyolipoma.   Electronically Signed By: Fredrich Jefferson M.D. On: 12/03/2023 12:58   Assessment & Plan:    1. Left renal angiomyelopoma Initially diagnosed on CT in 2016 without significant enlargement.  Will discontinue screening imaging.   2. Bilateral renal cysts Common finding Follow-up PRN  [facility] [address] [place], [place] [address] [phone]

## 2024-03-07 NOTE — Progress Notes (Unsigned)
 Assessment/Plan:    1.  Essential Tremor  -Status post bilateral VIM DBS in January, 2018.  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  Patient subsequently had a left VIM DBS on December 04, 2017 with IPG placement on Jan 12, 2018.  His right IPG was changed out October 31, 2021.  His left IPG was changed November 17, 2023 by Dr. Carollee. 2.  Low back pain  -Status post L3-L4 laminectomy with Dr. Carollee in July, 2024.   3.  Left renal angiomyolipoma  - Asymptomatic.  Following with urology and he has done well and they have discontinued screening imaging   Subjective:   Carlos Hunt was seen today in follow up for essential tremor.  Records reviewed.  Patient did have his left IPG changed out by Dr. Carollee November 17, 2023.  Patient did well with this.  His tremor is fairly well-controlled.  He has been following with urology for renal angiomyolipoma.  He had a follow-up May 21.  Records are reviewed.  Current prescribed movement disorder medications: n/a   ALLERGIES:   Allergies  Allergen Reactions   Amlodipine Other (See Comments)    Headache   Procaine Nausea And Vomiting and Other (See Comments)    NOVOCAINE- lidocaine  ok per patient   Sulfacetamide Sodium-Sulfur Rash    CURRENT MEDICATIONS:  Outpatient Encounter Medications as of 03/08/2024  Medication Sig   Ascorbic Acid  (VITAMIN C  PO) Take 1,000 mg by mouth in the morning.   aspirin  EC 81 MG tablet Take 1 tablet (81 mg total) by mouth daily. Swallow whole.   atorvastatin (LIPITOR) 40 MG tablet Take 40 mg by mouth in the morning.   Cholecalciferol  (VITAMIN D3) 2000 units TABS Take 2,000 Units by mouth in the morning.   Cyanocobalamin  (B-12 PO) Take 3,000 mcg by mouth in the morning.   Digestive Enzymes (PAPAYA AND ENZYMES PO) Take 1 tablet by mouth daily as needed (for heartburn/indigestion).   hydrALAZINE (APRESOLINE) 10 MG tablet Take 10 mg by mouth 3 (three) times daily.    hydrochlorothiazide  (HYDRODIURIL ) 25 MG tablet Take 1 tablet (25 mg total) by mouth daily.   isosorbide mononitrate (IMDUR) 60 MG 24 hr tablet Take 60 mg by mouth in the morning.   Multiple Vitamin (MULTIVITAMIN WITH MINERALS) TABS tablet Take 1 tablet by mouth in the morning. Centrum for Men   Polyvinyl Alcohol -Povidone PF (REFRESH) 1.4-0.6 % SOLN Place 1-2 drops into both eyes 3 (three) times daily as needed (dry/irritated eyes.).   sodium chloride  (OCEAN) 0.65 % nasal spray Place 1 spray into the nose at bedtime.   Tetrahydroz-Glyc-Hyprom-PEG (VISINE MAXIMUM REDNESS RELIEF OP) Place 1 drop into both eyes daily as needed (for eye irritation).   valsartan (DIOVAN) 320 MG tablet Take 320 mg by mouth in the morning.   No facility-administered encounter medications on file as of 03/08/2024.     Objective:    PHYSICAL EXAMINATION:    VITALS:   There were no vitals filed for this visit.  GEN:  The patient appears stated age and is in NAD. HEENT:  Normocephalic, atraumatic.  The mucous membranes are moist. The superficial temporal arteries are without ropiness or tenderness. Cv: Regular rate rhythm Lungs:  ctab  Neurological examination:  Orientation: The patient is alert and oriented x3. Cranial nerves: There is good facial symmetry. The speech is fluent and clear. Soft palate rises symmetrically and there is no tongue deviation.  Hearing is intact to conversational tone. Sensation: Sensation is intact to light touch throughout Motor: Strength is at least antigravity x4.  Movement examination: Tone: There is normal tone in the UE/LE Abnormal movements: There is no rest tremor.  There is no postural or intention tremor, but when the device is turned off, there is certainly a good amount of tremor (which we did today) Coordination:  There is no decremation with RAM's Gait and Station: The patient is wide-based and a bit unsteady. I have reviewed and interpreted the following labs  independently   Chemistry      Component Value Date/Time   NA 139 11/10/2023 0950   NA 142 12/07/2017 1225   K 4.4 11/10/2023 0950   CL 103 11/10/2023 0950   CO2 28 11/10/2023 0950   BUN 22 11/10/2023 0950   BUN 15 12/07/2017 1225   CREATININE 1.32 (H) 11/10/2023 0950   CREATININE 1.11 06/03/2016 1652      Component Value Date/Time   CALCIUM 9.7 11/10/2023 0950   ALKPHOS 72 11/10/2023 0950   AST 33 11/10/2023 0950   ALT 55 (H) 11/10/2023 0950   BILITOT 0.7 11/10/2023 0950   BILITOT 0.3 12/07/2017 1225     Total time spent on today's visit was *** minutes, including both face-to-face time and nonface-to-face time.  Time included that spent on review of records (prior notes available to me/labs/imaging if pertinent), discussing treatment and goals, answering patient's questions and coordinating care.  DBS time is reported separate.    Cc:  Claudene Rayfield HERO, MD

## 2024-03-08 ENCOUNTER — Ambulatory Visit: Payer: Medicare HMO | Admitting: Neurology

## 2024-03-08 ENCOUNTER — Encounter: Payer: Self-pay | Admitting: Neurology

## 2024-03-08 VITALS — BP 120/70 | HR 68 | Wt 190.0 lb

## 2024-03-08 DIAGNOSIS — Z9689 Presence of other specified functional implants: Secondary | ICD-10-CM | POA: Diagnosis not present

## 2024-03-08 DIAGNOSIS — G250 Essential tremor: Secondary | ICD-10-CM | POA: Diagnosis not present

## 2024-03-08 NOTE — Procedures (Signed)
 DBS Programming was performed.    Manufacturer of DBS device: [employer]onic  Total time spent programming was 5 minutes.  Device was confirmed to be on.  Soft start was confirmed to be on.  Impedences were checked and were within normal limits.  Battery was checked and was not near the end of life on the left.  Final settings were as follows:   Active Contact Amplitude (mA) PW (ms) Frequency (hz) Side Effects Battery  Left Brain        04/10/20 0-1+ 2.2 90 150  2.96  04/10/21 0-1+ 2.2 90 150  2.93  06/27/21 0-1+ 2.2 90 150  2.92  10/15/21 0-1+ 2.2 90 150  2.90  12/30/21 0-1+ 2.2 90 150  2.89  09/09/22 0-1+ 2.2 90 150  2.84  05/14/23 0-1+ 2.2(1.8-2.2) 90 150  2.72  10/27/23 0-1+ 2.2 90 150  2.56  03/08/24 0-1+ 2.2 90 150  2.98          Right Brain        04/10/20 2-1+ 2.7 90 150  2.91  04/10/21 2-1+ 2.7 90 150  2.80  06/27/21 2-1+ 2.7 90 150  2.75  10/15/21 2-1+ 2.5 90 140  2.58  12/30/21 2-1+ 2.7 90 140  3.00  09/09/22 2-1+ 2.7 90 140  2.96  05/14/23 2-1+ 2.7 (2.1-2.9) 90 140  2.96  10/27/23 2-1+ 2.7 90 140  2.94  03/08/24 2-1+ 2.7 90 140  2.93

## 2025-03-08 ENCOUNTER — Encounter: Admitting: Neurology
